# Patient Record
Sex: Female | Born: 1935 | Race: White | Hispanic: No | Smoking: Never smoker
Health system: Southern US, Community
[De-identification: ages and names within clinical notes are randomized; demographics above are authoritative.]

## PROBLEM LIST (undated history)

## (undated) DIAGNOSIS — F0390 Unspecified dementia without behavioral disturbance: Secondary | ICD-10-CM

## (undated) DIAGNOSIS — I10 Essential (primary) hypertension: Secondary | ICD-10-CM

## (undated) DIAGNOSIS — N184 Chronic kidney disease, stage 4 (severe): Secondary | ICD-10-CM

## (undated) DIAGNOSIS — F32A Depression, unspecified: Secondary | ICD-10-CM

## (undated) DIAGNOSIS — C189 Malignant neoplasm of colon, unspecified: Secondary | ICD-10-CM

## (undated) DIAGNOSIS — K435 Parastomal hernia without obstruction or  gangrene: Secondary | ICD-10-CM

## (undated) HISTORY — PX: COLECTOMY WITH COLOSTOMY CREATION/HARTMANN PROCEDURE: SHX6598

---

## 2008-09-12 ENCOUNTER — Ambulatory Visit: Payer: Self-pay | Admitting: Cardiovascular Disease

## 2008-09-12 ENCOUNTER — Other Ambulatory Visit: Payer: Self-pay

## 2008-09-12 ENCOUNTER — Ambulatory Visit: Payer: Self-pay | Admitting: Ophthalmology

## 2008-09-24 ENCOUNTER — Ambulatory Visit: Payer: Self-pay | Admitting: Ophthalmology

## 2008-12-03 ENCOUNTER — Ambulatory Visit: Payer: Self-pay | Admitting: Ophthalmology

## 2010-02-12 ENCOUNTER — Inpatient Hospital Stay: Payer: Self-pay | Admitting: Internal Medicine

## 2010-03-03 ENCOUNTER — Ambulatory Visit: Payer: Self-pay | Admitting: Internal Medicine

## 2010-03-04 ENCOUNTER — Inpatient Hospital Stay: Payer: Self-pay | Admitting: Internal Medicine

## 2010-03-10 ENCOUNTER — Inpatient Hospital Stay: Payer: Self-pay | Admitting: Internal Medicine

## 2010-03-13 ENCOUNTER — Encounter: Payer: Self-pay | Admitting: Internal Medicine

## 2012-09-19 ENCOUNTER — Emergency Department: Payer: Self-pay | Admitting: Internal Medicine

## 2012-09-19 LAB — COMPREHENSIVE METABOLIC PANEL
Albumin: 4 g/dL (ref 3.4–5.0)
Anion Gap: 13 (ref 7–16)
Bilirubin,Total: 0.5 mg/dL (ref 0.2–1.0)
Calcium, Total: 9.4 mg/dL (ref 8.5–10.1)
Chloride: 102 mmol/L (ref 98–107)
Co2: 20 mmol/L — ABNORMAL LOW (ref 21–32)
EGFR (African American): 37 — ABNORMAL LOW
Glucose: 134 mg/dL — ABNORMAL HIGH (ref 65–99)
Osmolality: 280 (ref 275–301)
SGOT(AST): 26 U/L (ref 15–37)
SGPT (ALT): 28 U/L (ref 12–78)
Total Protein: 8.2 g/dL (ref 6.4–8.2)

## 2012-09-19 LAB — CBC
MCH: 29.8 pg (ref 26.0–34.0)
MCHC: 33.8 g/dL (ref 32.0–36.0)
MCV: 88 fL (ref 80–100)
Platelet: 240 10*3/uL (ref 150–440)
RBC: 4.25 10*6/uL (ref 3.80–5.20)

## 2012-09-19 LAB — TROPONIN I: Troponin-I: 0.02 ng/mL

## 2014-07-20 DIAGNOSIS — D649 Anemia, unspecified: Secondary | ICD-10-CM | POA: Insufficient documentation

## 2014-07-20 DIAGNOSIS — D638 Anemia in other chronic diseases classified elsewhere: Secondary | ICD-10-CM | POA: Insufficient documentation

## 2015-12-25 DIAGNOSIS — N184 Chronic kidney disease, stage 4 (severe): Secondary | ICD-10-CM | POA: Diagnosis present

## 2017-04-05 DIAGNOSIS — F5104 Psychophysiologic insomnia: Secondary | ICD-10-CM | POA: Insufficient documentation

## 2021-07-27 ENCOUNTER — Emergency Department: Payer: Medicare Other

## 2021-07-27 ENCOUNTER — Inpatient Hospital Stay: Payer: Medicare Other

## 2021-07-27 ENCOUNTER — Other Ambulatory Visit: Payer: Self-pay

## 2021-07-27 ENCOUNTER — Inpatient Hospital Stay
Admission: EM | Admit: 2021-07-27 | Discharge: 2021-08-01 | DRG: 640 | Disposition: A | Discharge status: Skilled Nursing Facility | Payer: Medicare Other | Attending: Internal Medicine | Admitting: Internal Medicine

## 2021-07-27 DIAGNOSIS — Z933 Colostomy status: Secondary | ICD-10-CM

## 2021-07-27 DIAGNOSIS — E785 Hyperlipidemia, unspecified: Secondary | ICD-10-CM | POA: Diagnosis present

## 2021-07-27 DIAGNOSIS — Z681 Body mass index (BMI) 19 or less, adult: Secondary | ICD-10-CM

## 2021-07-27 DIAGNOSIS — Z20822 Contact with and (suspected) exposure to covid-19: Secondary | ICD-10-CM | POA: Diagnosis present

## 2021-07-27 DIAGNOSIS — A419 Sepsis, unspecified organism: Secondary | ICD-10-CM

## 2021-07-27 DIAGNOSIS — E861 Hypovolemia: Secondary | ICD-10-CM | POA: Diagnosis present

## 2021-07-27 DIAGNOSIS — R778 Other specified abnormalities of plasma proteins: Secondary | ICD-10-CM | POA: Diagnosis not present

## 2021-07-27 DIAGNOSIS — E871 Hypo-osmolality and hyponatremia: Principal | ICD-10-CM | POA: Diagnosis present

## 2021-07-27 DIAGNOSIS — I2510 Atherosclerotic heart disease of native coronary artery without angina pectoris: Secondary | ICD-10-CM | POA: Diagnosis present

## 2021-07-27 DIAGNOSIS — Z23 Encounter for immunization: Secondary | ICD-10-CM | POA: Diagnosis not present

## 2021-07-27 DIAGNOSIS — G9341 Metabolic encephalopathy: Secondary | ICD-10-CM

## 2021-07-27 DIAGNOSIS — Z66 Do not resuscitate: Secondary | ICD-10-CM | POA: Diagnosis not present

## 2021-07-27 DIAGNOSIS — R319 Hematuria, unspecified: Secondary | ICD-10-CM | POA: Diagnosis not present

## 2021-07-27 DIAGNOSIS — I10 Essential (primary) hypertension: Secondary | ICD-10-CM | POA: Insufficient documentation

## 2021-07-27 DIAGNOSIS — R809 Proteinuria, unspecified: Secondary | ICD-10-CM | POA: Diagnosis present

## 2021-07-27 DIAGNOSIS — R54 Age-related physical debility: Secondary | ICD-10-CM | POA: Diagnosis present

## 2021-07-27 DIAGNOSIS — I161 Hypertensive emergency: Secondary | ICD-10-CM | POA: Diagnosis present

## 2021-07-27 DIAGNOSIS — Z9049 Acquired absence of other specified parts of digestive tract: Secondary | ICD-10-CM

## 2021-07-27 DIAGNOSIS — J449 Chronic obstructive pulmonary disease, unspecified: Secondary | ICD-10-CM | POA: Diagnosis present

## 2021-07-27 DIAGNOSIS — I472 Ventricular tachycardia: Secondary | ICD-10-CM | POA: Diagnosis not present

## 2021-07-27 DIAGNOSIS — D631 Anemia in chronic kidney disease: Secondary | ICD-10-CM | POA: Diagnosis present

## 2021-07-27 DIAGNOSIS — Z85038 Personal history of other malignant neoplasm of large intestine: Secondary | ICD-10-CM

## 2021-07-27 DIAGNOSIS — N184 Chronic kidney disease, stage 4 (severe): Secondary | ICD-10-CM | POA: Diagnosis present

## 2021-07-27 DIAGNOSIS — I471 Supraventricular tachycardia: Secondary | ICD-10-CM | POA: Diagnosis not present

## 2021-07-27 DIAGNOSIS — E860 Dehydration: Secondary | ICD-10-CM | POA: Diagnosis present

## 2021-07-27 DIAGNOSIS — N390 Urinary tract infection, site not specified: Secondary | ICD-10-CM | POA: Diagnosis present

## 2021-07-27 DIAGNOSIS — E46 Unspecified protein-calorie malnutrition: Secondary | ICD-10-CM

## 2021-07-27 DIAGNOSIS — B9620 Unspecified Escherichia coli [E. coli] as the cause of diseases classified elsewhere: Secondary | ICD-10-CM | POA: Diagnosis present

## 2021-07-27 DIAGNOSIS — F0390 Unspecified dementia without behavioral disturbance: Secondary | ICD-10-CM | POA: Diagnosis present

## 2021-07-27 DIAGNOSIS — I160 Hypertensive urgency: Secondary | ICD-10-CM

## 2021-07-27 DIAGNOSIS — I129 Hypertensive chronic kidney disease with stage 1 through stage 4 chronic kidney disease, or unspecified chronic kidney disease: Secondary | ICD-10-CM | POA: Diagnosis present

## 2021-07-27 DIAGNOSIS — N179 Acute kidney failure, unspecified: Secondary | ICD-10-CM | POA: Diagnosis present

## 2021-07-27 DIAGNOSIS — I248 Other forms of acute ischemic heart disease: Secondary | ICD-10-CM | POA: Diagnosis present

## 2021-07-27 DIAGNOSIS — E43 Unspecified severe protein-calorie malnutrition: Secondary | ICD-10-CM | POA: Insufficient documentation

## 2021-07-27 DIAGNOSIS — I214 Non-ST elevation (NSTEMI) myocardial infarction: Secondary | ICD-10-CM | POA: Diagnosis not present

## 2021-07-27 DIAGNOSIS — D510 Vitamin B12 deficiency anemia due to intrinsic factor deficiency: Secondary | ICD-10-CM | POA: Diagnosis present

## 2021-07-27 DIAGNOSIS — E876 Hypokalemia: Secondary | ICD-10-CM | POA: Diagnosis present

## 2021-07-27 DIAGNOSIS — R64 Cachexia: Secondary | ICD-10-CM | POA: Diagnosis present

## 2021-07-27 DIAGNOSIS — E875 Hyperkalemia: Secondary | ICD-10-CM | POA: Diagnosis not present

## 2021-07-27 DIAGNOSIS — Z79899 Other long term (current) drug therapy: Secondary | ICD-10-CM

## 2021-07-27 DIAGNOSIS — R531 Weakness: Secondary | ICD-10-CM

## 2021-07-27 DIAGNOSIS — R7989 Other specified abnormal findings of blood chemistry: Secondary | ICD-10-CM

## 2021-07-27 DIAGNOSIS — I4729 Other ventricular tachycardia: Secondary | ICD-10-CM

## 2021-07-27 HISTORY — DX: Essential (primary) hypertension: I10

## 2021-07-27 HISTORY — DX: Chronic kidney disease, stage 4 (severe): N18.4

## 2021-07-27 HISTORY — DX: Malignant neoplasm of colon, unspecified: C18.9

## 2021-07-27 LAB — URINALYSIS, COMPLETE (UACMP) WITH MICROSCOPIC
Bacteria, UA: NONE SEEN
Bilirubin Urine: NEGATIVE
Glucose, UA: NEGATIVE mg/dL
Ketones, ur: 5 mg/dL — AB
Leukocytes,Ua: NEGATIVE
Nitrite: NEGATIVE
Protein, ur: 100 mg/dL — AB
Specific Gravity, Urine: 1.009 (ref 1.005–1.030)
Squamous Epithelial / HPF: NONE SEEN (ref 0–5)
pH: 6 (ref 5.0–8.0)

## 2021-07-27 LAB — CBC WITH DIFFERENTIAL/PLATELET
Abs Immature Granulocytes: 0.04 10*3/uL (ref 0.00–0.07)
Basophils Absolute: 0 10*3/uL (ref 0.0–0.1)
Basophils Relative: 0 %
Eosinophils Absolute: 0 10*3/uL (ref 0.0–0.5)
Eosinophils Relative: 0 %
HCT: 37.5 % (ref 36.0–46.0)
Hemoglobin: 13.8 g/dL (ref 12.0–15.0)
Immature Granulocytes: 1 %
Lymphocytes Relative: 9 %
Lymphs Abs: 0.7 10*3/uL (ref 0.7–4.0)
MCH: 30.6 pg (ref 26.0–34.0)
MCHC: 36.8 g/dL — ABNORMAL HIGH (ref 30.0–36.0)
MCV: 83.1 fL (ref 80.0–100.0)
Monocytes Absolute: 0.4 10*3/uL (ref 0.1–1.0)
Monocytes Relative: 5 %
Neutro Abs: 6.9 10*3/uL (ref 1.7–7.7)
Neutrophils Relative %: 85 %
Platelets: 323 10*3/uL (ref 150–400)
RBC: 4.51 MIL/uL (ref 3.87–5.11)
RDW: 12.5 % (ref 11.5–15.5)
WBC: 8 10*3/uL (ref 4.0–10.5)
nRBC: 0 % (ref 0.0–0.2)

## 2021-07-27 LAB — COMPREHENSIVE METABOLIC PANEL
ALT: 23 U/L (ref 0–44)
AST: 43 U/L — ABNORMAL HIGH (ref 15–41)
Albumin: 4.6 g/dL (ref 3.5–5.0)
Alkaline Phosphatase: 53 U/L (ref 38–126)
Anion gap: 13 (ref 5–15)
BUN: 26 mg/dL — ABNORMAL HIGH (ref 8–23)
CO2: 21 mmol/L — ABNORMAL LOW (ref 22–32)
Calcium: 9.2 mg/dL (ref 8.9–10.3)
Chloride: 79 mmol/L — ABNORMAL LOW (ref 98–111)
Creatinine, Ser: 1.25 mg/dL — ABNORMAL HIGH (ref 0.44–1.00)
GFR, Estimated: 39 mL/min — ABNORMAL LOW (ref >60)
Glucose, Bld: 119 mg/dL — ABNORMAL HIGH (ref 70–99)
Potassium: 4.4 mmol/L (ref 3.5–5.1)
Sodium: 113 mmol/L — CL (ref 135–145)
Total Bilirubin: 1.5 mg/dL — ABNORMAL HIGH (ref 0.3–1.2)
Total Protein: 7.9 g/dL (ref 6.5–8.1)

## 2021-07-27 LAB — PROTIME-INR
INR: 0.9 (ref 0.8–1.2)
Prothrombin Time: 12.5 seconds (ref 11.4–15.2)

## 2021-07-27 LAB — TROPONIN I (HIGH SENSITIVITY): Troponin I (High Sensitivity): 128 ng/L (ref <18)

## 2021-07-27 LAB — LIPASE, BLOOD: Lipase: 41 U/L (ref 11–51)

## 2021-07-27 LAB — LACTIC ACID, PLASMA: Lactic Acid, Venous: 1.8 mmol/L (ref 0.5–1.9)

## 2021-07-27 LAB — BRAIN NATRIURETIC PEPTIDE: B Natriuretic Peptide: 260 pg/mL — ABNORMAL HIGH (ref 0.0–100.0)

## 2021-07-27 MED ORDER — AMLODIPINE BESYLATE 5 MG PO TABS
5.0000 mg | ORAL_TABLET | Freq: Once | ORAL | Status: AC
  Administered 2021-07-28: 5 mg via ORAL
  Filled 2021-07-27: qty 1

## 2021-07-27 MED ORDER — IPRATROPIUM-ALBUTEROL 0.5-2.5 (3) MG/3ML IN SOLN
3.0000 mL | Freq: Once | RESPIRATORY_TRACT | Status: AC
  Administered 2021-07-27: 3 mL via RESPIRATORY_TRACT
  Filled 2021-07-27: qty 3

## 2021-07-27 MED ORDER — SODIUM CHLORIDE 0.9 % IV BOLUS
1000.0000 mL | Freq: Once | INTRAVENOUS | Status: AC
  Administered 2021-07-28: 1000 mL via INTRAVENOUS

## 2021-07-27 NOTE — ED Notes (Signed)
Patient repetitively fixating on her insurance company. Pt difficult to reorient.

## 2021-07-27 NOTE — ED Notes (Signed)
Patient reports that she has been weaker than usual for the past several days. Pt states that she has not eaten or drank anything for the past 2 days and has not taken her medications for the past 2 days. Pt very anxious on assessment.

## 2021-07-27 NOTE — ED Triage Notes (Signed)
Patient presents via [facility] from home complaining of weakness, SHOB, and nausea. Pt family mentions possible AMS.

## 2021-07-27 NOTE — ED Provider Notes (Signed)
[facility] Emergency Department Provider Note   ____________________________________________   Event Date/Time   First MD Initiated Contact with Patient 07/27/21 2120     (approximate)  I have reviewed the triage vital signs and the nursing notes.   HISTORY  Chief Complaint Weakness and Shortness of Breath    HPI Autumn Johnston is a 87 y.o. female who presents via EMS for generalized weakness and altered mental status.  Patient is a very poor historian and only states that "I been feeling just so weak".  Patient is not responding appropriately to any further questioning.  Per EMS, family states that patient has had nothing to eat or drink in the last 2 days nor has she had any of her normal medications including her blood pressure medications.  Patient also has a ostomy in place without any ostomy bag over top of it and EMS noted it to be significantly irritated.          Past Medical History:  Diagnosis Date   Hypertension     Patient Active Problem List   Diagnosis Date Noted   Benign hypertension 07/27/2021   Hyperlipidemia 07/27/2021   Pernicious anemia 07/27/2021   Renal failure (ARF), acute on chronic (HCC) 07/27/2021   Chronic insomnia 04/05/2017   Chronic kidney disease (CKD), stage IV (severe) ([place]) 12/25/2015   Anemia 07/20/2014     Prior to Admission medications   Medication Sig Start Date End Date Taking? Authorizing Provider  metoprolol tartrate (LOPRESSOR) 25 MG tablet Take 1 tablet by mouth 3 (three) times daily. 10/10/20  Yes [provider]  ALPRAZolam (XANAX) 0.25 MG tablet Take 0.25 mg by mouth 2 (two) times daily as needed. 05/06/21   [provider]  amLODipine (NORVASC) 5 MG tablet Take 5 mg by mouth daily. 05/24/21   [provider]  losartan (COZAAR) 100 MG tablet Take 100 mg by mouth daily. 05/24/21   [provider]  sertraline (ZOLOFT) 50 MG tablet Take 50 mg by mouth daily.  05/24/21   [provider]  simvastatin (ZOCOR) 20 MG tablet Take 20 mg by mouth at bedtime. 05/24/21   [provider]  zolpidem (AMBIEN) 10 MG tablet Take 10 mg by mouth at bedtime as needed. 05/05/21   [provider]    Allergies Patient has no known allergies.  No family history on file.  Social History Social History   Tobacco Use   Smoking status: Never   Smokeless tobacco: Never  Substance Use Topics   Alcohol use: Not Currently   Drug use: Not Currently    Review of Systems Unable to fully assess secondary to mental status ____________________________________________   PHYSICAL EXAM:  VITAL SIGNS: ED Triage Vitals  Enc Vitals Group     BP 07/27/21 2122 (!) 199/99     Pulse Rate 07/27/21 2122 (!) 106     Resp 07/27/21 2122 (!) 30     Temp 07/27/21 2122 97.7 F (36.5 C)     Temp Source 07/27/21 2122 Oral     SpO2 07/27/21 2122 99 %     Weight --      Height --      Head Circumference --      Peak Flow --      Pain Score 07/27/21 2128 5     Pain Loc --      Pain Edu? --      Excl. in [place]? --    Constitutional: Alert and  disoriented.  Cachectic elderly Caucasian female in no acute distress. Eyes: Conjunctivae are injected. PERRL. Head: Atraumatic. Nose: No congestion/rhinnorhea. Mouth/Throat: Mucous membranes are moist. Neck: No stridor Cardiovascular: Grossly normal heart sounds.  Good peripheral circulation. Respiratory: Increased respiratory effort.  No retractions. Gastrointestinal: Soft and nontender. No distention.  Left lower quadrant ostomy with a small amount of surrounding irritation. Musculoskeletal: No obvious deformities Neurologic:  Normal speech and language. No gross focal neurologic deficits are appreciated. Skin:  Skin is warm and dry. No rash noted. Psychiatric: Mood and affect are normal. Speech and behavior are normal.  ____________________________________________   LABS (all labs ordered are listed, but  only abnormal results are displayed)  Labs Reviewed  COMPREHENSIVE METABOLIC PANEL - Abnormal; Notable for the following components:      Result Value   Sodium 113 (*)    Chloride 79 (*)    CO2 21 (*)    Glucose, Bld 119 (*)    BUN 26 (*)    Creatinine, Ser 1.25 (*)    AST 43 (*)    Total Bilirubin 1.5 (*)    GFR, Estimated 39 (*)    All other components within normal limits  CBC WITH DIFFERENTIAL/PLATELET - Abnormal; Notable for the following components:   MCHC 36.8 (*)    All other components within normal limits  URINALYSIS, COMPLETE (UACMP) WITH MICROSCOPIC - Abnormal; Notable for the following components:   Color, Urine STRAW (*)    APPearance CLEAR (*)    Hgb urine dipstick MODERATE (*)    Ketones, ur 5 (*)    Protein, ur 100 (*)    All other components within normal limits  BRAIN NATRIURETIC PEPTIDE - Abnormal; Notable for the following components:   B Natriuretic Peptide 260.0 (*)    All other components within normal limits  TROPONIN I (HIGH SENSITIVITY) - Abnormal; Notable for the following components:   Troponin I (High Sensitivity) 128 (*)    All other components within normal limits  CULTURE, BLOOD (ROUTINE X 2)  CULTURE, BLOOD (ROUTINE X 2)  RESP PANEL BY RT-PCR (FLU A&B, COVID) ARPGX2  LACTIC ACID, PLASMA  PROTIME-INR  LIPASE, BLOOD  LACTIC ACID, PLASMA  TROPONIN I (HIGH SENSITIVITY)   ____________________________________________  EKG  ED ECG REPORT I, Naaman Plummer, the attending physician, personally viewed and interpreted this ECG.  Date: 07/27/2021 EKG Time: 2139 Rate: 100 Rhythm: Tachycardic sinus rhythm QRS Axis: normal Intervals: normal ST/T Wave abnormalities: normal Narrative Interpretation: Tachycardic sinus rhythm no evidence of acute ischemia  ____________________________________________  RADIOLOGY  ED MD interpretation: One-view portable chest x-ray shows no evidence of acute abnormalities including no pneumonia, pneumothorax,  or widened mediastinum  CT of the head without contrast shows no evidence of acute abnormalities including no intracerebral hemorrhage, obvious masses, or significant edema  Official radiology report(s): DG Chest 1 View  Result Date: 07/27/2021 CLINICAL DATA:  Weakness, shortness of breath, nausea EXAM: CHEST  1 VIEW COMPARISON:  03/03/2010 FINDINGS: There is hyperinflation of the lungs compatible with COPD. Biapical scarring. Heart is upper limits normal in size. No confluent opacities or effusions. No acute bony abnormality. IMPRESSION: COPD/chronic changes.  No active disease. Electronically Signed   By: Rolm Baptise M.D.   On: 07/27/2021 21:53   CT Head Wo Contrast  Result Date: 07/27/2021 CLINICAL DATA:  Weakness shortness of breath altered EXAM: CT HEAD WITHOUT CONTRAST TECHNIQUE: Contiguous axial images were obtained from the base of the skull through the vertex without intravenous contrast. COMPARISON:  CT brain 09/19/2012 FINDINGS: Brain: No acute territorial infarction, hemorrhage or intracranial mass. Moderate atrophy. Nonenlarged ventricles Vascular: No hyperdense vessels.  Carotid vascular calcification Skull: Normal. Negative for fracture or focal lesion. Sinuses/Orbits: No acute finding. Other: None IMPRESSION: 1. No CT evidence for acute intracranial abnormality. 2. Atrophy Electronically Signed   By: Donavan Foil M.D.   On: 07/27/2021 22:07    ____________________________________________   PROCEDURES  Procedure(s) performed (including Critical Care):  .1-3 Lead EKG Interpretation  Date/Time: 07/27/2021 11:19 PM Performed by: Naaman Plummer, MD Authorized by: Naaman Plummer, MD     Interpretation: normal     ECG rate:  92   ECG rate assessment: normal     Rhythm: sinus rhythm     Ectopy: none     Conduction: normal     ____________________________________________   INITIAL IMPRESSION / ASSESSMENT AND PLAN / ED COURSE  As part of my medical decision making, I  reviewed the following data within the electronic medical record, if available:  Nursing notes reviewed and incorporated, Labs reviewed, EKG interpreted, Old chart reviewed, Radiograph reviewed and Notes from prior ED visits reviewed and incorporated     Patient is a 87 year old female that presents via EMS for generalized weakness. Patient found to be hyponatremic with an AKI and NSTEMI without an overt and safely reversible cause and therefore requires admission for further monitoring and management.  As patient not significantly clinically volume down, altered or with emergent sequelae defer fluid resuscitation at this time and increasing Sodium to admitting team and inpatient consultants.      ____________________________________________   FINAL CLINICAL IMPRESSION(S) / ED DIAGNOSES  Final diagnoses:  Generalized weakness  Dehydration  Hyponatremia  AKI (acute kidney injury) [facility])     ED Discharge Orders     None        Note:  This document was prepared using Dragon voice recognition software and may include unintentional dictation errors.    Naaman Plummer, MD 07/27/21 [phone]

## 2021-07-28 ENCOUNTER — Encounter: Payer: Self-pay | Admitting: Internal Medicine

## 2021-07-28 DIAGNOSIS — I248 Other forms of acute ischemic heart disease: Secondary | ICD-10-CM | POA: Diagnosis not present

## 2021-07-28 DIAGNOSIS — E46 Unspecified protein-calorie malnutrition: Secondary | ICD-10-CM

## 2021-07-28 DIAGNOSIS — E871 Hypo-osmolality and hyponatremia: Principal | ICD-10-CM

## 2021-07-28 DIAGNOSIS — R778 Other specified abnormalities of plasma proteins: Secondary | ICD-10-CM

## 2021-07-28 DIAGNOSIS — I160 Hypertensive urgency: Secondary | ICD-10-CM

## 2021-07-28 LAB — LIPID PANEL
Cholesterol: 270 mg/dL — ABNORMAL HIGH (ref 0–200)
HDL: 122 mg/dL (ref >40)
LDL Cholesterol: 141 mg/dL — ABNORMAL HIGH (ref 0–99)
Total CHOL/HDL Ratio: 2.2 RATIO
Triglycerides: 37 mg/dL (ref <150)
VLDL: 7 mg/dL (ref 0–40)

## 2021-07-28 LAB — BASIC METABOLIC PANEL
Anion gap: 12 (ref 5–15)
BUN: 24 mg/dL — ABNORMAL HIGH (ref 8–23)
CO2: 21 mmol/L — ABNORMAL LOW (ref 22–32)
Calcium: 8.5 mg/dL — ABNORMAL LOW (ref 8.9–10.3)
Chloride: 83 mmol/L — ABNORMAL LOW (ref 98–111)
Creatinine, Ser: 1.01 mg/dL — ABNORMAL HIGH (ref 0.44–1.00)
GFR, Estimated: 51 mL/min — ABNORMAL LOW (ref >60)
Glucose, Bld: 115 mg/dL — ABNORMAL HIGH (ref 70–99)
Potassium: 3.9 mmol/L (ref 3.5–5.1)
Sodium: 116 mmol/L — CL (ref 135–145)

## 2021-07-28 LAB — APTT: aPTT: 27 seconds (ref 24–36)

## 2021-07-28 LAB — SODIUM
Sodium: 113 mmol/L — CL (ref 135–145)
Sodium: 128 mmol/L — ABNORMAL LOW (ref 135–145)
Sodium: 116 mmol/L — CL (ref 135–145)
Sodium: 116 mmol/L — CL (ref 135–145)
Sodium: 118 mmol/L — CL (ref 135–145)
Sodium: 122 mmol/L — ABNORMAL LOW (ref 135–145)

## 2021-07-28 LAB — TROPONIN I (HIGH SENSITIVITY)
Troponin I (High Sensitivity): 187 ng/L (ref <18)
Troponin I (High Sensitivity): 476 ng/L (ref <18)
Troponin I (High Sensitivity): 406 ng/L (ref <18)

## 2021-07-28 LAB — HEMOGLOBIN A1C
Hgb A1c MFr Bld: 5.6 % (ref 4.8–5.6)
Mean Plasma Glucose: 114.02 mg/dL

## 2021-07-28 LAB — HEPARIN LEVEL (UNFRACTIONATED)
Heparin Unfractionated: 0.42 IU/mL (ref 0.30–0.70)
Heparin Unfractionated: 0.38 IU/mL (ref 0.30–0.70)

## 2021-07-28 LAB — SODIUM, URINE, RANDOM: Sodium, Ur: 92 mmol/L

## 2021-07-28 LAB — RESP PANEL BY RT-PCR (FLU A&B, COVID) ARPGX2
Influenza A by PCR: NEGATIVE
Influenza B by PCR: NEGATIVE
SARS Coronavirus 2 by RT PCR: NEGATIVE

## 2021-07-28 LAB — OSMOLALITY: Osmolality: 245 mOsm/kg — CL (ref 275–295)

## 2021-07-28 LAB — OSMOLALITY, URINE: Osmolality, Ur: 371 mOsm/kg (ref 300–900)

## 2021-07-28 LAB — LACTIC ACID, PLASMA: Lactic Acid, Venous: 2.5 mmol/L (ref 0.5–1.9)

## 2021-07-28 MED ORDER — HEPARIN (PORCINE) 25000 UT/250ML-% IV SOLN
550.0000 [IU]/h | INTRAVENOUS | Status: DC
  Administered 2021-07-28: 550 [IU]/h via INTRAVENOUS
  Filled 2021-07-28 (×2): qty 250

## 2021-07-28 MED ORDER — DESMOPRESSIN ACETATE 4 MCG/ML IJ SOLN
2.0000 ug | Freq: Four times a day (QID) | INTRAMUSCULAR | Status: DC
  Administered 2021-07-28: 2 ug via INTRAVENOUS
  Filled 2021-07-28 (×2): qty 1

## 2021-07-28 MED ORDER — ONDANSETRON HCL 4 MG PO TABS: 4.0000 mg | ORAL_TABLET | Freq: Four times a day (QID) | ORAL | Status: DC | PRN

## 2021-07-28 MED ORDER — ACETAMINOPHEN 650 MG RE SUPP: 650.0000 mg | Freq: Four times a day (QID) | RECTAL | Status: DC | PRN

## 2021-07-28 MED ORDER — SODIUM CHLORIDE 3 % IV SOLN
INTRAVENOUS | Status: DC
  Filled 2021-07-28 (×2): qty 500

## 2021-07-28 MED ORDER — ASPIRIN EC 81 MG PO TBEC
81.0000 mg | DELAYED_RELEASE_TABLET | Freq: Every day | ORAL | Status: DC
  Administered 2021-07-28 – 2021-08-01 (×5): 81 mg via ORAL
  Filled 2021-07-28 (×5): qty 1

## 2021-07-28 MED ORDER — IOHEXOL 350 MG/ML SOLN
75.0000 mL | Freq: Once | INTRAVENOUS | Status: AC | PRN
  Administered 2021-07-28: 75 mL via INTRAVENOUS

## 2021-07-28 MED ORDER — SODIUM CHLORIDE 0.9 % IV SOLN: INTRAVENOUS | Status: DC

## 2021-07-28 MED ORDER — ACETAMINOPHEN 325 MG PO TABS: 650.0000 mg | ORAL_TABLET | Freq: Four times a day (QID) | ORAL | Status: DC | PRN

## 2021-07-28 MED ORDER — AMLODIPINE BESYLATE 5 MG PO TABS
5.0000 mg | ORAL_TABLET | Freq: Every day | ORAL | Status: DC
  Administered 2021-07-28 – 2021-07-30 (×3): 5 mg via ORAL
  Filled 2021-07-28 (×3): qty 1

## 2021-07-28 MED ORDER — DEXTROSE 5 % IV SOLN
500.0000 mL | Freq: Once | INTRAVENOUS | Status: AC
  Administered 2021-07-28: 500 mL via INTRAVENOUS
  Filled 2021-07-28: qty 500

## 2021-07-28 MED ORDER — ENOXAPARIN SODIUM 30 MG/0.3ML IJ SOSY: 30.0000 mg | PREFILLED_SYRINGE | INTRAMUSCULAR | Status: DC

## 2021-07-28 MED ORDER — ONDANSETRON HCL 4 MG/2ML IJ SOLN
4.0000 mg | Freq: Four times a day (QID) | INTRAMUSCULAR | Status: DC | PRN
  Administered 2021-07-28: 4 mg via INTRAVENOUS
  Filled 2021-07-28: qty 2

## 2021-07-28 MED ORDER — HALOPERIDOL LACTATE 5 MG/ML IJ SOLN: 0.5000 mg | Freq: Four times a day (QID) | INTRAMUSCULAR | Status: DC | PRN

## 2021-07-28 MED ORDER — CARVEDILOL 6.25 MG PO TABS
6.2500 mg | ORAL_TABLET | Freq: Two times a day (BID) | ORAL | Status: DC
  Administered 2021-07-28 – 2021-07-30 (×4): 6.25 mg via ORAL
  Filled 2021-07-28 (×4): qty 1

## 2021-07-28 NOTE — H&P (Addendum)
History and Physical    Autumn Johnston [id number] DOB: Feb 01, 1922 DOA: 07/27/2021  PCP: Idelle Crouch, MD   Patient coming from: home  I have personally briefly reviewed patient's old medical records in [place]  Chief Complaint: altered mental status  HPI: Autumn Johnston is a 87 y.o. female with medical history significant for HTN, CKD4, colon cancer s/p colectomy with colostomy who lives at home with her husband and is independent and takes care of all her financial affairs herself who was brought into the ED with a concern for weakness and altered mental status.  Most of the history is taken from her daughter, [name] at the bedside.  She states that patient was in her usual state of health until about 2 days ago when she noticed that she was too weak to climb the 3 stairs in the home of the split-level house.  Additionally she has been eating less than usual but has been trying to stay hydrated.  She has not recently been ill and denies any recent vomiting, increasing colostomy output, cough or shortness of breath or fever or chills.  She did note that her mother appeared to be acting differently, complaining about the medication that her doctor for several years had been giving her and overall acting out of the ordinary.  She states that she has been drinking a lot of fluids over the past couple months because of her poor kidney function associated with a high potassium of 6.4 back in May but otherwise she has had no medical complaints.  ED course: On arrival temperature 97.7, pulse 106, BP 199/99, respirations 30 with O2 sat 99% on room air CBC unremarkable, CMP with sodium 113, down from 127 on 7/27 at her doctor's office, creatinine 1.25 down from baseline of around 1.9, also with GFR of 39 down from baseline below 30.  Lipase 41.  Troponin 128 and BNP 260.  Lactic acid 1.8.  Urinalysis with moderate hemoglobin and proteinuria and 5 ketones  EKG, personally  viewed and interpreted: Sinus tachycardia at 100 with no acute ST-T wave changes  Imaging: Chest x-ray with COPD/chronic changes Head CT with no acute intracranial findings  Patient given an IV fluid bolus of normal saline.  Hospitalist consulted for admission.  Review of Systems: Unable to obtain due to confusion  Past Medical History:  Diagnosis Date   CKD (chronic kidney disease) stage 4, GFR 15-29 ml/min (HCC)    Colon cancer ([place])    Hypertension     Past Surgical History:  Procedure Laterality Date   COLECTOMY WITH COLOSTOMY CREATION/HARTMANN PROCEDURE       reports that she has never smoked. She has never used smokeless tobacco. She reports previous alcohol use. She reports previous drug use.  No Known Allergies  History reviewed. No pertinent family history.    Prior to Admission medications   Medication Sig Start Date End Date Taking? Authorizing Provider  metoprolol tartrate (LOPRESSOR) 25 MG tablet Take 1 tablet by mouth 3 (three) times daily. 10/10/20  Yes [provider]  ALPRAZolam (XANAX) 0.25 MG tablet Take 0.25 mg by mouth 2 (two) times daily as needed. 05/06/21   [provider]  amLODipine (NORVASC) 5 MG tablet Take 5 mg by mouth daily. 05/24/21   [provider]  losartan (COZAAR) 100 MG tablet Take 100 mg by mouth daily. 05/24/21   [provider]  sertraline (ZOLOFT) 50 MG tablet Take 50 mg by mouth daily. 05/24/21  [provider]  simvastatin (ZOCOR) 20 MG tablet Take 20 mg by mouth at bedtime. 05/24/21   [provider]  zolpidem (AMBIEN) 10 MG tablet Take 10 mg by mouth at bedtime as needed. 05/05/21   [provider]    Physical Exam: Vitals:   07/27/21 2122 07/27/21 2300 07/27/21 2315  BP: (!) 199/99 (!) 185/81   Pulse: (!) 106 93   Resp: (!) 30 (!) 23   Temp: 97.7 F (36.5 C)    TempSrc: Oral    SpO2: 99% 99%   Weight:   45.4 kg  Height:   5\' 3"  (1.6 m)     Vitals:   07/27/21 2122  07/27/21 2300 07/27/21 2315  BP: (!) 199/99 (!) 185/81   Pulse: (!) 106 93   Resp: (!) 30 (!) 23   Temp: 97.7 F (36.5 C)    TempSrc: Oral    SpO2: 99% 99%   Weight:   45.4 kg  Height:   5\' 3"  (1.6 m)      Constitutional: Cachectic appearance but alert and confused.  Talks a lot but unrelated to questions asked not in any apparent distress HEENT:      Head: Normocephalic and atraumatic.         Eyes: PERLA, EOMI, Conjunctivae are normal. Sclera is non-icteric.       Mouth/Throat: Mucous membranes are moist.       Neck: Supple with no signs of meningismus. Cardiovascular: Regular rate and rhythm. No murmurs, gallops, or rubs. 2+ symmetrical distal pulses are present . No JVD. No LE edema Respiratory: Respiratory effort normal .Lungs sounds clear bilaterally. No wheezes, crackles, or rhonchi.  Gastrointestinal: Soft, non tender, and non distended with positive bowel sounds.  Genitourinary: No CVA tenderness. Musculoskeletal: Nontender with normal range of motion in all extremities. No cyanosis, or erythema of extremities. Neurologic:  Face is symmetric. Moving all extremities. No gross focal neurologic deficits . Skin: Skin is warm, dry.  No rash or ulcers Psychiatric: Appears a bit anxious.  Talking a lot   Labs on Admission: I have personally reviewed following labs and imaging studies  CBC: Recent Labs  Lab 07/27/21 2136  WBC 8.0  NEUTROABS 6.9  HGB 13.8  HCT 37.5  MCV 83.1  PLT 119   Basic Metabolic Panel: Recent Labs  Lab 07/27/21 2136  NA 113*  K 4.4  CL 79*  CO2 21*  GLUCOSE 119*  BUN 26*  CREATININE 1.25*  CALCIUM 9.2   GFR: Estimated Creatinine Clearance: 18.9 mL/min (A) (by C-G formula based on SCr of 1.25 mg/dL (H)). Liver Function Tests: Recent Labs  Lab 07/27/21 2136  AST 43*  ALT 23  ALKPHOS 53  BILITOT 1.5*  PROT 7.9  ALBUMIN 4.6   Recent Labs  Lab 07/27/21 2136  LIPASE 41   No results for input(s): AMMONIA in the last 168  hours. Coagulation Profile: Recent Labs  Lab 07/27/21 2136  INR 0.9   Cardiac Enzymes: No results for input(s): CKTOTAL, CKMB, CKMBINDEX, TROPONINI in the last 168 hours. BNP (last 3 results) No results for input(s): PROBNP in the last 8760 hours. HbA1C: No results for input(s): HGBA1C in the last 72 hours. CBG: No results for input(s): GLUCAP in the last 168 hours. Lipid Profile: No results for input(s): CHOL, HDL, LDLCALC, TRIG, CHOLHDL, LDLDIRECT in the last 72 hours. Thyroid Function Tests: No results for input(s): TSH, T4TOTAL, FREET4, T3FREE, THYROIDAB in the last 72 hours. Anemia Panel: No results  for input(s): VITAMINB12, FOLATE, FERRITIN, TIBC, IRON, RETICCTPCT in the last 72 hours. Urine analysis:    Component Value Date/Time   COLORURINE STRAW (A) 07/27/2021 2136   APPEARANCEUR CLEAR (A) 07/27/2021 2136   LABSPEC 1.009 07/27/2021 2136   PHURINE 6.0 07/27/2021 2136   GLUCOSEU NEGATIVE 07/27/2021 2136   HGBUR MODERATE (A) 07/27/2021 2136   BILIRUBINUR NEGATIVE 07/27/2021 2136   KETONESUR 5 (A) 07/27/2021 2136   PROTEINUR 100 (A) 07/27/2021 2136   NITRITE NEGATIVE 07/27/2021 2136   LEUKOCYTESUR NEGATIVE 07/27/2021 2136    Radiological Exams on Admission: DG Chest 1 View  Result Date: 07/27/2021 CLINICAL DATA:  Weakness, shortness of breath, nausea EXAM: CHEST  1 VIEW COMPARISON:  03/03/2010 FINDINGS: There is hyperinflation of the lungs compatible with COPD. Biapical scarring. Heart is upper limits normal in size. No confluent opacities or effusions. No acute bony abnormality. IMPRESSION: COPD/chronic changes.  No active disease. Electronically Signed   By: Rolm Baptise M.D.   On: 07/27/2021 21:53   CT Head Wo Contrast  Result Date: 07/27/2021 CLINICAL DATA:  Weakness shortness of breath altered EXAM: CT HEAD WITHOUT CONTRAST TECHNIQUE: Contiguous axial images were obtained from the base of the skull through the vertex without intravenous contrast. COMPARISON:  CT  brain 09/19/2012 FINDINGS: Brain: No acute territorial infarction, hemorrhage or intracranial mass. Moderate atrophy. Nonenlarged ventricles Vascular: No hyperdense vessels.  Carotid vascular calcification Skull: Normal. Negative for fracture or focal lesion. Sinuses/Orbits: No acute finding. Other: None IMPRESSION: 1. No CT evidence for acute intracranial abnormality. 2. Atrophy Electronically Signed   By: Donavan Foil M.D.   On: 07/27/2021 22:07     Assessment/Plan 87 year old female with history of HTN, CKD4, colon cancer s/p colectomy with colostomy presenting with weakness and altered mental status.      Acute hyponatremia, symptomatic -Patient symptomatic for altered mental status - Sodium 113, down from 127 on 7/27 - Suspecting hypotonic hypovolemic given decreased oral intake x2 days and ketonuria on UA, vs hypotonic related to increase water intake to improve renal function as advised by PCP during last visit on 7/27 - Urine and sodium osmolality and urine sodium to assist with etiology Addendum: - Repeat sodium following delayed IV fluid bolus from ED unchanged 113-113 - 3% saline infusion with monitoring per order set protocol Addendum#2: - Repeat sodium 128 suggesting overcorrection and DDAVP and D5 infusion were ordered however redraw for verification was done due to rapid overcorrection.  Repeat sodium resulting at 116 and 3% was reinitiated but at a rate of 30 mils per hour    Acute metabolic encephalopathy - Etiology related to sodium level.  No alternate etiology seen at this time - CT head with chronic ischemic changes and no acute abnormality.  Neuro exam nonfocal - We will keep n.p.o. for now - Fall and aspiration precautions - Increase nursing care    NSTEMI/ Elevated troponin - Troponin 287>681>157.   - Patient denies chest pain and EKG nonacute - NSTEMI versus demand ischemia -Discussed risk benefit of anticoagulation with daughter and she wants full scope of  treatment - Heparin infusion - Echocardiogram to evaluate for segmental wall motion abnormality.  Given renal function will likely not be a candidate for cardiac cath - Cardiology consult  Hypertensive urgency -BP 199/99 likely from not taking medication past couple days due to altered mental status - IV hydralazine as needed  History of colon cancer s/p colectomy with colostomy - Routine colostomy care    Chronic kidney disease (CKD),  stage IV -patient has had CKD stage IV since October 2020.  Does not follow with a nephrologist - GFR of 39, improved from prior GFR below 30 for past couple years - Consider renal consult  Protein calorie malnutrition, unspecified - Patient with cachectic appearance and daughter stating progressive weight loss over the past 1 to 2 years - Dietary consult  DVT prophylaxis: Lovenox  Code Status: full code  Family Communication: Spoke with daughter, Theola Sequin and explained plan of care.  She verbalized understanding and voiced agreement.  We discussed CODE STATUS and she confirmed that patient is a full code to the best of her knowledge of her mother's wishes Disposition Plan: Back to previous home environment Consults called: cardiology  Status:At the time of admission, it appears that the appropriate admission status for this patient is INPATIENT. This is judged to be reasonable and necessary in order to provide the required intensity of service to ensure the patient's safety given the presenting symptoms, physical exam findings, and initial radiographic and laboratory data in the context of their  Comorbid conditions.   Patient requires inpatient status due to high intensity of service, high risk for further deterioration and high frequency of surveillance required.   I certify that at the point of admission it is my clinical judgment that the patient will require inpatient hospital care spanning beyond [employer] MD [facility]     07/28/2021, 12:09 AM

## 2021-07-28 NOTE — Consult Note (Addendum)
Cardiology Consultation:   Patient ID: Zavia Pullen MRN: [id number]; DOB: [birth year]  Admit date: 07/27/2021 Date of Consult: 07/28/2021  PCP:  Idelle Crouch, MD   [facility] Cardiologist:  New   Patient Profile:   Mikki Ziff is a 87 y.o. female with a hx of HTN, HLD, CKD stage 4, colon cancer with colostomy  who is being seen 07/28/2021 for the evaluation of elevated troponin at the request of Dr. Roosevelt Locks.  History of Present Illness:   Ms. Lyday has no prior significant cardiac history. She is accompanied by her daughter, who provided history given patient's AMS. Denies family history of cardiac disease. No smoking, alcohol, or drug history. Patient lives with husband and is very functional at baseline. No baseline dementia. She was taking losartan, amlodipine for BP and simvastatin for HLD. Says she commonly has elevated blood pressures at the doctor's office.   The patient presented to the ER 07/28/21 for AMS and weakness. She was brought from home. Daughter reported symptoms started 2-3 days ago. Patient was unable to climb the stairs. Also reported low appetite. No known chest pain, palpitations, SOB, LLE, Orthopnea, pnd.   In the ER BP 199/99, RR 30, temp 97.7, pulse 106bpm. Labs showed sodium 113, Scr 1.25, lipase 41, BNP 260, LA 1.8. HS trop 166>063>016. UA with HGB and protein. CXR showed COPD, no active disease. EKG not found in the chart. CT head w/o contrast negative for acute abnormality. Chest CT showed no PE. Pt was given IVF and started on IV heparin. Nephrology consulted for hyponatremia.   Past Medical History:  Diagnosis Date   CKD (chronic kidney disease) stage 4, GFR 15-29 ml/min (HCC)    Colon cancer (HCC)    Hypertension     Past Surgical History:  Procedure Laterality Date   COLECTOMY WITH COLOSTOMY CREATION/HARTMANN PROCEDURE       Home Medications:  Prior to Admission medications   Medication Sig Start Date End Date Taking?  Authorizing Provider  ALPRAZolam (XANAX) 0.25 MG tablet Take 0.25 mg by mouth 2 (two) times daily as needed. 05/06/21   [provider]  amLODipine (NORVASC) 5 MG tablet Take 5 mg by mouth daily. 05/24/21   [provider]  losartan (COZAAR) 100 MG tablet Take 100 mg by mouth daily. 05/24/21   [provider]  sertraline (ZOLOFT) 50 MG tablet Take 50 mg by mouth daily. 05/24/21   [provider]  simvastatin (ZOCOR) 20 MG tablet Take 20 mg by mouth at bedtime. 05/24/21   [provider]  zolpidem (AMBIEN) 10 MG tablet Take 10 mg by mouth at bedtime as needed. 05/05/21   [provider]    Inpatient Medications: Scheduled Meds:  amLODipine  5 mg Oral Daily   Continuous Infusions:  heparin 550 Units/hr (07/28/21 0430)   sodium chloride (hypertonic) 30 mL/hr at 07/28/21 0624   PRN Meds: acetaminophen **OR** acetaminophen, haloperidol lactate, ondansetron **OR** ondansetron (ZOFRAN) IV  Allergies:   No Known Allergies  Social History:   Social History   Socioeconomic History   Marital status: Married    Spouse name: Not on file   Number of children: Not on file   Years of education: Not on file   Highest education level: Not on file  Occupational History   Not on file  Tobacco Use   Smoking status: Never   Smokeless tobacco: Never  Substance and Sexual Activity   Alcohol use: Never   Drug use: Never  Sexual activity: Not on file  Other Topics Concern   Not on file  Social History Narrative   Not on file   Social Determinants of Health   Financial Resource Strain: Not on file  Food Insecurity: Not on file  Transportation Needs: Not on file  Physical Activity: Not on file  Stress: Not on file  Social Connections: Not on file  Intimate Partner Violence: Not on file    Family History:   History reviewed. No pertinent family history.   ROS:  Please see the history of present illness.   All other ROS reviewed and  negative.     Physical Exam/Data:   Vitals:   07/28/21 0700 07/28/21 0730 07/28/21 0800 07/28/21 0900  BP: (!) 146/74 (!) 146/74 (!) 157/71 (!) 160/82  Pulse: 88 84 86 91  Resp: (!) 24 (!) 22 (!) 21 16  Temp:      TempSrc:      SpO2: 99% 100% 100% 100%  Weight:      Height:        Intake/Output Summary (Last 24 hours) at 07/28/2021 0956 Last data filed at 07/28/2021 0735 Gross per 24 hour  Intake 1156.11 ml  Output 580 ml  Net 576.11 ml   Last 3 Weights 07/27/2021  Weight (lbs) 100 lb  Weight (kg) 45.36 kg     Body mass index is 17.71 kg/m.  General:  Well nourished, well developed, in no acute distress HEENT: normal Lymph: no adenopathy Neck: no JVD Endocrine:  No thryomegaly Vascular: No carotid bruits; FA pulses 2+ bilaterally without bruits  Cardiac:  normal S1, S2; RRR; no murmur  Lungs:  clear to auscultation bilaterally, no wheezing, rhonchi or rales  Abd: soft, nontender, no hepatomegaly  Ext: no edema Musculoskeletal:  No deformities, BUE and BLE strength normal and equal Skin: warm and dry  Neuro:  CNs 2-12 intact, no focal abnormalities noted Psych:  Normal affect   EKG:  The EKG was personally reviewed and demonstrates:  pending Telemetry:  Telemetry was personally reviewed and demonstrates:  NSR, PAC, PVC, HR 80-90s  Relevant CV Studies:  Echo ordered  Laboratory Data:  High Sensitivity Troponin:   Recent Labs  Lab 07/27/21 2136 07/27/21 2234 07/28/21 0110 07/28/21 0428  TROPONINIHS 128* 187* 476* 406*     Chemistry Recent Labs  Lab 07/27/21 2136 07/28/21 0110 07/28/21 0428 07/28/21 0528 07/28/21 0653  NA 113*   < > 128* 116* 116*  K 4.4  --   --   --  3.9  CL 79*  --   --   --  83*  CO2 21*  --   --   --  21*  GLUCOSE 119*  --   --   --  115*  BUN 26*  --   --   --  24*  CREATININE 1.25*  --   --   --  1.01*  CALCIUM 9.2  --   --   --  8.5*  GFRNONAA 39*  --   --   --  51*  ANIONGAP 13  --   --   --  12   < > = values in this  interval not displayed.    Recent Labs  Lab 07/27/21 2136  PROT 7.9  ALBUMIN 4.6  AST 43*  ALT 23  ALKPHOS 53  BILITOT 1.5*   Hematology Recent Labs  Lab 07/27/21 2136  WBC 8.0  RBC 4.51  HGB 13.8  HCT 37.5  MCV 83.1  MCH 30.6  MCHC 36.8*  RDW 12.5  PLT 323   BNP Recent Labs  Lab 07/27/21 2136  BNP 260.0*    DDimer No results for input(s): DDIMER in the last 168 hours.   Radiology/Studies:  DG Chest 1 View  Result Date: 07/27/2021 CLINICAL DATA:  Weakness, shortness of breath, nausea EXAM: CHEST  1 VIEW COMPARISON:  03/03/2010 FINDINGS: There is hyperinflation of the lungs compatible with COPD. Biapical scarring. Heart is upper limits normal in size. No confluent opacities or effusions. No acute bony abnormality. IMPRESSION: COPD/chronic changes.  No active disease. Electronically Signed   By: Rolm Baptise M.D.   On: 07/27/2021 21:53   CT Head Wo Contrast  Result Date: 07/27/2021 CLINICAL DATA:  Weakness shortness of breath altered EXAM: CT HEAD WITHOUT CONTRAST TECHNIQUE: Contiguous axial images were obtained from the base of the skull through the vertex without intravenous contrast. COMPARISON:  CT brain 09/19/2012 FINDINGS: Brain: No acute territorial infarction, hemorrhage or intracranial mass. Moderate atrophy. Nonenlarged ventricles Vascular: No hyperdense vessels.  Carotid vascular calcification Skull: Normal. Negative for fracture or focal lesion. Sinuses/Orbits: No acute finding. Other: None IMPRESSION: 1. No CT evidence for acute intracranial abnormality. 2. Atrophy Electronically Signed   By: Donavan Foil M.D.   On: 07/27/2021 22:07   CT Angio Chest PE W/Cm &/Or Wo Cm  Result Date: 07/28/2021 CLINICAL DATA:  Weakness and altered mental status EXAM: CT ANGIOGRAPHY CHEST WITH CONTRAST TECHNIQUE: Multidetector CT imaging of the chest was performed using the standard protocol during bolus administration of intravenous contrast. Multiplanar CT image reconstructions  and MIPs were obtained to evaluate the vascular anatomy. CONTRAST:  49 mL OMNIPAQUE 350 COMPARISON:  Chest x-ray from the previous day. FINDINGS: Cardiovascular: Atherosclerotic calcifications are noted without aneurysmal dilatation or dissection. No cardiac enlargement is noted. Coronary calcifications are seen. The pulmonary artery shows a normal branching pattern. Pulmonary artery shows a normal branching pattern without evidence of intraluminal filling defect to suggest pulmonary embolism. Mediastinum/Nodes: Thoracic inlet is within normal limits. No sizable hilar or mediastinal adenopathy is noted. The esophagus as visualized is within normal limits. Lungs/Pleura: Lungs are well aerated bilaterally. Tiny less than 5 mm nodule is noted within the left upper lobe best seen on image number 53 of series 6. Scattered nodules are noted within the right lower lobe measuring approximately 5 mm. Upper Abdomen: Cholelithiasis is seen. Hepatic and renal cysts are noted. No acute abnormality in the upper abdomen is seen. Musculoskeletal: Degenerative changes of the thoracic spine are noted. Increased kyphosis is seen. No acute compression deformity is noted. No rib abnormality is seen. Review of the MIP images confirms the above findings. IMPRESSION: No evidence of pulmonary embolism. Scattered small nodules in the lungs bilaterally. No follow-up needed if patient is low-risk (and has no known or suspected primary neoplasm). Non-contrast chest CT can be considered in 12 months if patient is high-risk. This recommendation follows the consensus statement: Guidelines for Management of Incidental Pulmonary Nodules Detected on CT Images: From the Fleischner Society 2017; Radiology 2017; 284:228-243. Cholelithiasis without complicating factors. Aortic Atherosclerosis (ICD10-I70.0). Electronically Signed   By: Inez Catalina M.D.   On: 07/28/2021 00:40     Assessment and Plan:   Hyponatremia - Sodium 113 on arrival. Level was  normal 7/27 labs - s/p IVF and saline solution - repeat sodium 116 - further work-up per IM/nephrology  Acute encephalopathy - Unclear reason for AMS. No baseline dementia reported by daughter - She  is A&O x1.  - CT head without acute abnormality - she is NPO per IM - Blood culture collected  Elevated troponin - in the setting of AMS, electrolyte abnormality, and severe HTN - Hs trop peak 476, now down trending - No EKG in the chart and unable to locate EKG down. Will order EKG - No chest pain reported - RF include HTN and HLD - CT chest showed coronary atherosclerosis - IV heparin started - echo ordered - Pending echo can consider ischemic evaluation, but can likely be done as outpatient - check A1C and lipid panel - start ASA 81mg  daily and coreg 6.25mg  BID. Continue statin - Further work-up pending echo. Continue IV heparin for 48 hours.   Hypertensive urgency - BP 199/99 on arrival, suspected not on medications due to AMS - PTA losartan 100mg  daily amlodipine 5mg . ARB held for CKD/Na - IV hydralazine  - start coreg as above.  - Bps improved but still elevated - BNP mildly elevated, may be due to mild CHF 2/2 hypertension. Does not appear volume up on exam. Echo pending as above. Of note was given IVF  HLD - PTA simvastatin - check lipid panl  CKD stage IV - Cr 1.25 on admission. Improved s/p IVF.  - nephrology following - daily BMET  H/o colon cancer s/p colectomy with colostomy - per IM   For questions or updates, please contact [facility] Please consult [email or web address] for contact info under    Signed, Lateasha Breuer Ninfa Meeker, PA-C  07/28/2021 9:56 AM

## 2021-07-28 NOTE — Progress Notes (Signed)
[facility] PROGRESS NOTE    Autumn Johnston  GQQ:[id number] DOB: [birth year] DOA: 07/27/2021 PCP: Idelle Crouch, MD   Chief complaint.  Altered mental status. Brief Narrative:  Autumn Johnston is a 87 y.o. female with medical history significant for HTN, CKD4, colon cancer s/p colectomy with colostomy who lives at home with her husband and is independent and takes care of all her financial affairs herself who was brought into the ED with a concern for weakness and altered mental status.  Patient had hypocalcemia with a potassium of 6.4 in May, since that time, he was advised to drink excessive amount of fluids.  She has been drinking large amount of water on a daily basis.  Upon arriving the emergency room, she was found to have altered mental status with potassium 113.  He was given 3% sodium chloride.  Consult from nephrology is also obtained.   Assessment & Plan:   Principal Problem:   Acute hyponatremia Active Problems:   Chronic kidney disease (CKD), stage IV (severe) (HCC)   Elevated troponin   Acute metabolic encephalopathy   Hypertensive urgency   Protein calorie malnutrition ([place])  #1.  Severe hyponatremia. Altered mental status with acute metabolic cephalopathy secondary to hypokalemia. This appears to be secondary to polydipsia.  Patient has been started on 3% sodium chloride.  Appreciate nephrology consult.  We will continue to follow closely.  Patient mental status has improved since admission to the hospital. I will start a diet without restriction with sodium.  Fluid restriction 1200 mL.  2.  Non-ST elevation myocardial infarction. Patient has been followed by cardiology, currently on heparin drip.  3.  Hypertensive emergency Blood pressure is better since admission to the hospital.  4.  Chronic kidney disease stage IV. Renal function stable now.  5.  Severe protein calorie malnutrition. Patient appear cachectic, will start supplement once sodium level is  better.  Try to avoid large amount oral fluids.    DVT prophylaxis: Lovenox Code Status: full, discussed with patient daughter, advised DNR.  She will talk to patient and her family and make a decision Family Communication: Daughter updated Disposition Plan:    Status is: Inpatient  Remains inpatient appropriate because:Altered mental status, IV treatments appropriate due to intensity of illness or inability to take PO, and Inpatient level of care appropriate due to severity of illness  Dispo: The patient is from: Home              Anticipated d/c is to: Home              Patient currently is not medically stable to d/c.   Difficult to place patient No        I/O last 3 completed shifts: In: 1156.1 [I.V.:156.1; IV Piggyback:1000] Out: 100 [Urine:100] Total I/O In: -  Out: 52 [Urine:625]     Consultants:  Nephrology  Procedures: None  Antimicrobials: None  Subjective: Patient mental status has been better, she no longer has any confusion. She denies any short of breath or cough. No abdominal pain nausea vomiting.   Objective: Vitals:   07/28/21 1030 07/28/21 1130 07/28/21 1200 07/28/21 1230  BP: (!) 157/74 (!) 152/78 137/72 138/72  Pulse: 87 84 83 83  Resp: 17 (!) 21 (!) 31 (!) 27  Temp:      TempSrc:      SpO2: 100% 100% 100% 100%  Weight:      Height:        Intake/Output Summary (Last 24  hours) at 07/28/2021 1340 Last data filed at 07/28/2021 1049 Gross per 24 hour  Intake 1156.11 ml  Output 725 ml  Net 431.11 ml   Filed Weights   07/27/21 2315  Weight: 45.4 kg    Examination:  General exam: Appears calm and comfortable, cachectic Respiratory system: Clear to auscultation. Respiratory effort normal. Cardiovascular system: S1 & S2 heard, RRR. No JVD, murmurs, rubs, gallops or clicks. No pedal edema. Gastrointestinal system: Abdomen is nondistended, soft and nontender. No organomegaly or masses felt. Normal bowel sounds heard. Central nervous  system: Alert and oriented x2. No focal neurological deficits. Extremities: Symmetric 5 x 5 power. Skin: No rashes, lesions or ulcers Psychiatry: Judgement and insight appear normal. Mood & affect appropriate.     Data Reviewed: I have personally reviewed following labs and imaging studies  CBC: Recent Labs  Lab 07/27/21 2136  WBC 8.0  NEUTROABS 6.9  HGB 13.8  HCT 37.5  MCV 83.1  PLT 417   Basic Metabolic Panel: Recent Labs  Lab 07/27/21 2136 07/28/21 0110 07/28/21 0428 07/28/21 0528 07/28/21 0653 07/28/21 0955  NA 113* 113* 128* 116* 116* 116*  K 4.4  --   --   --  3.9  --   CL 79*  --   --   --  83*  --   CO2 21*  --   --   --  21*  --   GLUCOSE 119*  --   --   --  115*  --   BUN 26*  --   --   --  24*  --   CREATININE 1.25*  --   --   --  1.01*  --   CALCIUM 9.2  --   --   --  8.5*  --    GFR: Estimated Creatinine Clearance: 23.3 mL/min (A) (by C-G formula based on SCr of 1.01 mg/dL (H)). Liver Function Tests: Recent Labs  Lab 07/27/21 2136  AST 43*  ALT 23  ALKPHOS 53  BILITOT 1.5*  PROT 7.9  ALBUMIN 4.6   Recent Labs  Lab 07/27/21 2136  LIPASE 41   No results for input(s): AMMONIA in the last 168 hours. Coagulation Profile: Recent Labs  Lab 07/27/21 2136  INR 0.9   Cardiac Enzymes: No results for input(s): CKTOTAL, CKMB, CKMBINDEX, TROPONINI in the last 168 hours. BNP (last 3 results) No results for input(s): PROBNP in the last 8760 hours. HbA1C: No results for input(s): HGBA1C in the last 72 hours. CBG: No results for input(s): GLUCAP in the last 168 hours. Lipid Profile: Recent Labs    07/28/21 1215  CHOL 270*  HDL 122  LDLCALC 141*  TRIG 37  CHOLHDL 2.2   Thyroid Function Tests: No results for input(s): TSH, T4TOTAL, FREET4, T3FREE, THYROIDAB in the last 72 hours. Anemia Panel: No results for input(s): VITAMINB12, FOLATE, FERRITIN, TIBC, IRON, RETICCTPCT in the last 72 hours. Sepsis Labs: Recent Labs  Lab 07/27/21 2136  07/28/21 0110  LATICACIDVEN 1.8 2.5*    Recent Results (from the past 240 hour(s))  Culture, blood (Routine x 2)     Status: None (Preliminary result)   Collection Time: 07/27/21  9:36 PM   Specimen: BLOOD  Result Value Ref Range Status   Specimen Description BLOOD BLOOD LEFT FOREARM  Final   Special Requests   Final    BOTTLES DRAWN AEROBIC AND ANAEROBIC Blood Culture results may not be optimal due to an inadequate volume of blood received in culture  bottles   Culture   Final    NO GROWTH < 12 HOURS Performed at [facility], [address] [place], [place] [address]    Report Status PENDING  Incomplete  Resp Panel by RT-PCR (Flu A&B, Covid) Nasopharyngeal Swab     Status: None   Collection Time: 07/27/21  9:36 PM   Specimen: Nasopharyngeal Swab; Nasopharyngeal(NP) swabs in vial transport medium  Result Value Ref Range Status   SARS Coronavirus 2 by RT PCR NEGATIVE NEGATIVE Final    Comment: (NOTE) SARS-CoV-2 target nucleic acids are NOT DETECTED.  The SARS-CoV-2 RNA is generally detectable in upper respiratory specimens during the acute phase of infection. The lowest concentration of SARS-CoV-2 viral copies this assay can detect is 138 copies/mL. A negative result does not preclude SARS-Cov-2 infection and should not be used as the sole basis for treatment or other patient management decisions. A negative result may occur with  improper specimen collection/handling, submission of specimen other than nasopharyngeal swab, presence of viral mutation(s) within the areas targeted by this assay, and inadequate number of viral copies(<138 copies/mL). A negative result must be combined with clinical observations, patient history, and epidemiological information. The expected result is Negative.  Fact Sheet for Patients:  EntrepreneurPulse.com.au  Fact Sheet for Healthcare Providers:  IncredibleEmployment.be  This test is no t yet  approved or cleared by the [place] FDA and  has been authorized for detection and/or diagnosis of SARS-CoV-2 by FDA under an Emergency Use Authorization (EUA). This EUA will remain  in effect (meaning this test can be used) for the duration of the COVID-19 declaration under Section 564(b)(1) of the Act, 21 U.S.C.section 360bbb-3(b)(1), unless the authorization is terminated  or revoked sooner.       Influenza A by PCR NEGATIVE NEGATIVE Final   Influenza B by PCR NEGATIVE NEGATIVE Final    Comment: (NOTE) The Xpert Xpress SARS-CoV-2/FLU/RSV plus assay is intended as an aid in the diagnosis of influenza from Nasopharyngeal swab specimens and should not be used as a sole basis for treatment. Nasal washings and aspirates are unacceptable for Xpert Xpress SARS-CoV-2/FLU/RSV testing.  Fact Sheet for Patients: EntrepreneurPulse.com.au  Fact Sheet for Healthcare Providers: IncredibleEmployment.be  This test is not yet approved or cleared by the [place] FDA and has been authorized for detection and/or diagnosis of SARS-CoV-2 by FDA under an Emergency Use Authorization (EUA). This EUA will remain in effect (meaning this test can be used) for the duration of the COVID-19 declaration under Section 564(b)(1) of the Act, 21 U.S.C. section 360bbb-3(b)(1), unless the authorization is terminated or revoked.  Performed at [facility], [address] [place], [place] [address]   Culture, blood (Routine x 2)     Status: None (Preliminary result)   Collection Time: 07/28/21  1:10 AM   Specimen: BLOOD  Result Value Ref Range Status   Specimen Description BLOOD BLOOD RIGHT FOREARM  Final   Special Requests   Final    BOTTLES DRAWN AEROBIC AND ANAEROBIC Blood Culture adequate volume   Culture   Final    NO GROWTH < 12 HOURS Performed at [facility], [address] [place], [place] [address]    Report Status PENDING   Incomplete         Radiology Studies: DG Chest 1 View  Result Date: 07/27/2021 CLINICAL DATA:  Weakness, shortness of breath, nausea EXAM: CHEST  1 VIEW COMPARISON:  03/03/2010 FINDINGS: There is hyperinflation of the lungs compatible with COPD.  Biapical scarring. Heart is upper limits normal in size. No confluent opacities or effusions. No acute bony abnormality. IMPRESSION: COPD/chronic changes.  No active disease. Electronically Signed   By: Rolm Baptise M.D.   On: 07/27/2021 21:53   CT Head Wo Contrast  Result Date: 07/27/2021 CLINICAL DATA:  Weakness shortness of breath altered EXAM: CT HEAD WITHOUT CONTRAST TECHNIQUE: Contiguous axial images were obtained from the base of the skull through the vertex without intravenous contrast. COMPARISON:  CT brain 09/19/2012 FINDINGS: Brain: No acute territorial infarction, hemorrhage or intracranial mass. Moderate atrophy. Nonenlarged ventricles Vascular: No hyperdense vessels.  Carotid vascular calcification Skull: Normal. Negative for fracture or focal lesion. Sinuses/Orbits: No acute finding. Other: None IMPRESSION: 1. No CT evidence for acute intracranial abnormality. 2. Atrophy Electronically Signed   By: Donavan Foil M.D.   On: 07/27/2021 22:07   CT Angio Chest PE W/Cm &/Or Wo Cm  Result Date: 07/28/2021 CLINICAL DATA:  Weakness and altered mental status EXAM: CT ANGIOGRAPHY CHEST WITH CONTRAST TECHNIQUE: Multidetector CT imaging of the chest was performed using the standard protocol during bolus administration of intravenous contrast. Multiplanar CT image reconstructions and MIPs were obtained to evaluate the vascular anatomy. CONTRAST:  49 mL OMNIPAQUE 350 COMPARISON:  Chest x-ray from the previous day. FINDINGS: Cardiovascular: Atherosclerotic calcifications are noted without aneurysmal dilatation or dissection. No cardiac enlargement is noted. Coronary calcifications are seen. The pulmonary artery shows a normal branching pattern. Pulmonary  artery shows a normal branching pattern without evidence of intraluminal filling defect to suggest pulmonary embolism. Mediastinum/Nodes: Thoracic inlet is within normal limits. No sizable hilar or mediastinal adenopathy is noted. The esophagus as visualized is within normal limits. Lungs/Pleura: Lungs are well aerated bilaterally. Tiny less than 5 mm nodule is noted within the left upper lobe best seen on image number 53 of series 6. Scattered nodules are noted within the right lower lobe measuring approximately 5 mm. Upper Abdomen: Cholelithiasis is seen. Hepatic and renal cysts are noted. No acute abnormality in the upper abdomen is seen. Musculoskeletal: Degenerative changes of the thoracic spine are noted. Increased kyphosis is seen. No acute compression deformity is noted. No rib abnormality is seen. Review of the MIP images confirms the above findings. IMPRESSION: No evidence of pulmonary embolism. Scattered small nodules in the lungs bilaterally. No follow-up needed if patient is low-risk (and has no known or suspected primary neoplasm). Non-contrast chest CT can be considered in 12 months if patient is high-risk. This recommendation follows the consensus statement: Guidelines for Management of Incidental Pulmonary Nodules Detected on CT Images: From the Fleischner Society 2017; Radiology 2017; 284:228-243. Cholelithiasis without complicating factors. Aortic Atherosclerosis (ICD10-I70.0). Electronically Signed   By: Inez Catalina M.D.   On: 07/28/2021 00:40        Scheduled Meds:  amLODipine  5 mg Oral Daily   aspirin EC  81 mg Oral Daily   carvedilol  6.25 mg Oral BID WC   Continuous Infusions:  heparin 550 Units/hr (07/28/21 0430)   sodium chloride (hypertonic) 30 mL/hr at 07/28/21 0624     LOS: 1 day    Time spent: No charge    Sharen Hones, MD [facility]   To contact the attending provider between 7A-7P or the covering provider during after hours 7P-7A, please log into  the web site [email or web address] and access using universal [place] password for that web site. If you do not have the password, please call the hospital operator.  07/28/2021, 1:40 PM

## 2021-07-28 NOTE — Progress Notes (Signed)
ANTICOAGULATION CONSULT NOTE - Initial Consult  Pharmacy Consult for Heparin  Indication: chest pain/ACS  No Known Allergies  Patient Measurements: Height: 5\' 3"  (160 cm) Weight: 45.4 kg (100 lb) IBW/kg (Calculated) : 52.4 Heparin Dosing Weight: 45.4 kg   Vital Signs: Temp: 97.7 F (36.5 C) (08/07 2122) Temp Source: Oral (08/07 2122) BP: 162/82 (08/08 0104) Pulse Rate: 92 (08/08 0315)  Labs: Recent Labs    07/27/21 2136 07/27/21 2234 07/28/21 0110  HGB 13.8  --   --   HCT 37.5  --   --   PLT 323  --   --   LABPROT 12.5  --   --   INR 0.9  --   --   CREATININE 1.25*  --   --   TROPONINIHS 128* 187* 476*    Estimated Creatinine Clearance: 18.9 mL/min (A) (by C-G formula based on SCr of 1.25 mg/dL (H)).   Medical History: Past Medical History:  Diagnosis Date   CKD (chronic kidney disease) stage 4, GFR 15-29 ml/min (HCC)    Colon cancer (HCC)    Hypertension     Medications:  (Not in a hospital admission)   Assessment: Pharmacy consulted to dose heparin in this 87 year old female admitted with ACS/NSTEMI.  No prior anticoag noted.  CrCl = 18.9 ml/min   Goal of Therapy:  Heparin level 0.3-0.7 units/ml Monitor platelets by anticoagulation protocol: Yes   Plan:  No bolus per MD request.  Start heparin infusion at 550 units/hr Check anti-Xa level in 8 hours and daily while on heparin Continue to monitor H&H and platelets  Autumn Johnston D 07/28/2021,4:15 AM

## 2021-07-28 NOTE — Consult Note (Signed)
[facility] Consult Note: 07/28/21     Date of Admission:  07/27/2021           Reason for Consult:  hyponatremia   Referring Provider: Sharen Hones, MD Primary Care Provider: Idelle Crouch, MD   History of Presenting Illness:  Autumn Johnston is a 87 y.o. female  Patient brought to the hospital via EMS for weakness, shortness of breath and nausea.  Family also reported altered mental status.  Per report, patient was not able to eat or drink 2 days prior to admission.  She has an ostomy in place. Initial work-up in the emergency room showed severe hyponatremia with sodium of 113.  This morning sodium is 116.  Nephrology consult has been requested for evaluation. Outpatient labs from July 27 show mildly low sodium of 127, creatinine of 1.7, GFR 20 Normal TSH 2.138 in May 2022, normal urinalysis in May 2022 Sodium trends reviewed Sodium of 128 seems to be lab error as it is inconsistent with the trends Information is obtained from patient's daughters who are in the room with her.  Patient herself is confused and fixated on some insurance issue.  Family reports that patient had elevated potassium back in May/June.  She was advised to increase fluid intake but patient started drinking large amounts of plain water.  Her appetite was poor she was not eating as much.  Review of Systems: ROS Limited review of systems due to patient being confused Gen: Denies any fevers or chills, poor appetite HEENT: No vision or hearing problems CV: No chest pain or shortness of breath Resp: No cough or sputum production GI: No nausea, vomiting or diarrhea.  No blood in the stool GU : No problems with voiding.  No hematuria.   MS: Ambulatory at home.  Denies any acute joint pain or swelling Derm:   No complaints Psych: No complaints Heme: No complaints Neuro: No complaints Endocrine: No complaints    Past Medical History:  Diagnosis Date   CKD (chronic kidney disease)  stage 4, GFR 15-29 ml/min (HCC)    Colon cancer (HCC)    Hypertension     Social History   Tobacco Use   Smoking status: Never   Smokeless tobacco: Never  Substance Use Topics   Alcohol use: Never   Drug use: Never    History reviewed. No pertinent family history.   OBJECTIVE: Blood pressure (!) 157/71, pulse 86, temperature 97.7 F (36.5 C), temperature source Oral, resp. rate (!) 21, height 5\' 3"  (1.6 m), weight 45.4 kg, SpO2 100 %.  Physical Exam General appearance: Frail, elderly woman, laying in the bed HEENT: Moist oral mucous membranes, anicteric Neck: No JVD, no masses Pulmonary: Normal breathing effort, decreased breath sounds at bases, no crackles Cardiovascular: Irregular rhythm Abdomen: Soft, nontender Extremities: No peripheral edema Musculoskeletal/joints: No acute joint swelling or effusion Skin: Warm, dry  Lab Results Lab Results  Component Value Date   WBC 8.0 07/27/2021   HGB 13.8 07/27/2021   HCT 37.5 07/27/2021   MCV 83.1 07/27/2021   PLT 323 07/27/2021    Lab Results  Component Value Date   CREATININE 1.01 (H) 07/28/2021   BUN 24 (H) 07/28/2021   NA 116 (LL) 07/28/2021   K 3.9 07/28/2021   CL 83 (L) 07/28/2021   CO2 21 (L) 07/28/2021    Lab Results  Component Value Date   ALT 23 07/27/2021   AST 43 (H) 07/27/2021   ALKPHOS 53 07/27/2021  BILITOT 1.5 (H) 07/27/2021     Microbiology: Recent Results (from the past 240 hour(s))  Culture, blood (Routine x 2)     Status: None (Preliminary result)   Collection Time: 07/27/21  9:36 PM   Specimen: BLOOD  Result Value Ref Range Status   Specimen Description BLOOD BLOOD LEFT FOREARM  Final   Special Requests   Final    BOTTLES DRAWN AEROBIC AND ANAEROBIC Blood Culture results may not be optimal due to an inadequate volume of blood received in culture bottles   Culture   Final    NO GROWTH < 12 HOURS Performed at [facility], [address] [place], [place] [address]     Report Status PENDING  Incomplete  Resp Panel by RT-PCR (Flu A&B, Covid) Nasopharyngeal Swab     Status: None   Collection Time: 07/27/21  9:36 PM   Specimen: Nasopharyngeal Swab; Nasopharyngeal(NP) swabs in vial transport medium  Result Value Ref Range Status   SARS Coronavirus 2 by RT PCR NEGATIVE NEGATIVE Final    Comment: (NOTE) SARS-CoV-2 target nucleic acids are NOT DETECTED.  The SARS-CoV-2 RNA is generally detectable in upper respiratory specimens during the acute phase of infection. The lowest concentration of SARS-CoV-2 viral copies this assay can detect is 138 copies/mL. A negative result does not preclude SARS-Cov-2 infection and should not be used as the sole basis for treatment or other patient management decisions. A negative result may occur with  improper specimen collection/handling, submission of specimen other than nasopharyngeal swab, presence of viral mutation(s) within the areas targeted by this assay, and inadequate number of viral copies(<138 copies/mL). A negative result must be combined with clinical observations, patient history, and epidemiological information. The expected result is Negative.  Fact Sheet for Patients:  EntrepreneurPulse.com.au  Fact Sheet for Healthcare Providers:  IncredibleEmployment.be  This test is no t yet approved or cleared by the [place] FDA and  has been authorized for detection and/or diagnosis of SARS-CoV-2 by FDA under an Emergency Use Authorization (EUA). This EUA will remain  in effect (meaning this test can be used) for the duration of the COVID-19 declaration under Section 564(b)(1) of the Act, 21 U.S.C.section 360bbb-3(b)(1), unless the authorization is terminated  or revoked sooner.       Influenza A by PCR NEGATIVE NEGATIVE Final   Influenza B by PCR NEGATIVE NEGATIVE Final    Comment: (NOTE) The Xpert Xpress SARS-CoV-2/FLU/RSV plus assay is intended as an aid in the  diagnosis of influenza from Nasopharyngeal swab specimens and should not be used as a sole basis for treatment. Nasal washings and aspirates are unacceptable for Xpert Xpress SARS-CoV-2/FLU/RSV testing.  Fact Sheet for Patients: EntrepreneurPulse.com.au  Fact Sheet for Healthcare Providers: IncredibleEmployment.be  This test is not yet approved or cleared by the [place] FDA and has been authorized for detection and/or diagnosis of SARS-CoV-2 by FDA under an Emergency Use Authorization (EUA). This EUA will remain in effect (meaning this test can be used) for the duration of the COVID-19 declaration under Section 564(b)(1) of the Act, 21 U.S.C. section 360bbb-3(b)(1), unless the authorization is terminated or revoked.  Performed at [facility], [address] [place], [place] [address]   Culture, blood (Routine x 2)     Status: None (Preliminary result)   Collection Time: 07/28/21  1:10 AM   Specimen: BLOOD  Result Value Ref Range Status   Specimen Description BLOOD BLOOD RIGHT FOREARM  Final   Special Requests   Final  BOTTLES DRAWN AEROBIC AND ANAEROBIC Blood Culture adequate volume   Culture   Final    NO GROWTH < 12 HOURS Performed at [facility], [address] [place], [place] [address]    Report Status PENDING  Incomplete    Medications: Scheduled Meds:  amLODipine  5 mg Oral Daily   Continuous Infusions:  heparin 550 Units/hr (07/28/21 0430)   sodium chloride (hypertonic) 30 mL/hr at 07/28/21 0624   PRN Meds:.acetaminophen **OR** acetaminophen, haloperidol lactate, ondansetron **OR** ondansetron (ZOFRAN) IV  No Known Allergies  Urinalysis: Recent Labs    07/27/21 2136  COLORURINE STRAW*  LABSPEC 1.009  PHURINE 6.0  GLUCOSEU NEGATIVE  HGBUR MODERATE*  BILIRUBINUR NEGATIVE  KETONESUR 5*  PROTEINUR 100*  NITRITE NEGATIVE  LEUKOCYTESUR NEGATIVE      Imaging: DG Chest 1 View  Result  Date: 07/27/2021 CLINICAL DATA:  Weakness, shortness of breath, nausea EXAM: CHEST  1 VIEW COMPARISON:  03/03/2010 FINDINGS: There is hyperinflation of the lungs compatible with COPD. Biapical scarring. Heart is upper limits normal in size. No confluent opacities or effusions. No acute bony abnormality. IMPRESSION: COPD/chronic changes.  No active disease. Electronically Signed   By: Rolm Baptise M.D.   On: 07/27/2021 21:53   CT Head Wo Contrast  Result Date: 07/27/2021 CLINICAL DATA:  Weakness shortness of breath altered EXAM: CT HEAD WITHOUT CONTRAST TECHNIQUE: Contiguous axial images were obtained from the base of the skull through the vertex without intravenous contrast. COMPARISON:  CT brain 09/19/2012 FINDINGS: Brain: No acute territorial infarction, hemorrhage or intracranial mass. Moderate atrophy. Nonenlarged ventricles Vascular: No hyperdense vessels.  Carotid vascular calcification Skull: Normal. Negative for fracture or focal lesion. Sinuses/Orbits: No acute finding. Other: None IMPRESSION: 1. No CT evidence for acute intracranial abnormality. 2. Atrophy Electronically Signed   By: Donavan Foil M.D.   On: 07/27/2021 22:07   CT Angio Chest PE W/Cm &/Or Wo Cm  Result Date: 07/28/2021 CLINICAL DATA:  Weakness and altered mental status EXAM: CT ANGIOGRAPHY CHEST WITH CONTRAST TECHNIQUE: Multidetector CT imaging of the chest was performed using the standard protocol during bolus administration of intravenous contrast. Multiplanar CT image reconstructions and MIPs were obtained to evaluate the vascular anatomy. CONTRAST:  49 mL OMNIPAQUE 350 COMPARISON:  Chest x-ray from the previous day. FINDINGS: Cardiovascular: Atherosclerotic calcifications are noted without aneurysmal dilatation or dissection. No cardiac enlargement is noted. Coronary calcifications are seen. The pulmonary artery shows a normal branching pattern. Pulmonary artery shows a normal branching pattern without evidence of intraluminal  filling defect to suggest pulmonary embolism. Mediastinum/Nodes: Thoracic inlet is within normal limits. No sizable hilar or mediastinal adenopathy is noted. The esophagus as visualized is within normal limits. Lungs/Pleura: Lungs are well aerated bilaterally. Tiny less than 5 mm nodule is noted within the left upper lobe best seen on image number 53 of series 6. Scattered nodules are noted within the right lower lobe measuring approximately 5 mm. Upper Abdomen: Cholelithiasis is seen. Hepatic and renal cysts are noted. No acute abnormality in the upper abdomen is seen. Musculoskeletal: Degenerative changes of the thoracic spine are noted. Increased kyphosis is seen. No acute compression deformity is noted. No rib abnormality is seen. Review of the MIP images confirms the above findings. IMPRESSION: No evidence of pulmonary embolism. Scattered small nodules in the lungs bilaterally. No follow-up needed if patient is low-risk (and has no known or suspected primary neoplasm). Non-contrast chest CT can be considered in 12 months if patient is high-risk. This recommendation follows  the consensus statement: Guidelines for Management of Incidental Pulmonary Nodules Detected on CT Images: From the Fleischner Society 2017; Radiology 2017; 284:228-243. Cholelithiasis without complicating factors. Aortic Atherosclerosis (ICD10-I70.0). Electronically Signed   By: Inez Catalina M.D.   On: 07/28/2021 00:40      Assessment/Plan:  Autumn Johnston is a 87 y.o. female with medical problems of   hypertension, CKD, colon cancer status post colectomy with a colostomy   was admitted on 07/27/2021 for :  Hyponatremia [E87.1]  #Acute hyponatremia Likely secondary to "tea and toast diet".  Patient's appetite has been poor as outpatient but she has been drinking increased amounts of plain water.  This may have contributed to Developing hyponatremia over time.  Review of outpatient records show that her sodium level was normal in  May 2022.  Mildly decreased sodium of 132 noted in July, further decrease noted on July 27 with sodium level of 127.  TSH normal.  CT with IV contrast without any evidence of lung mass.  Agree with 3% (hypertonic) saline administration at low-dose Currently sodium will be monitored every 4 hours and dose of hypertonic saline adjusted accordingly. Patient does appear to be delirious with some altered mental status compared to baseline as per family Monitor neuro status carefully  #Acute kidney injury Outpatient creatinine has been higher Unclear cause but ? Related to losartan. Currently on hold.  Creatinine level close to normal during this hospitalization. Will follow   Autumn Johnston Candiss Norse 07/28/21

## 2021-07-28 NOTE — ED Notes (Signed)
Pt resting quietly in bed, awake. Family member at bedside.

## 2021-07-28 NOTE — Progress Notes (Signed)
MEDICATION RELATED CONSULT NOTE - INITIAL   Pharmacy Consult for Hypertonic Saline  Indication: Na Monitoring   No Known Allergies  Patient Measurements: Height: 5\' 3"  (160 cm) Weight: 45.4 kg (100 lb) IBW/kg (Calculated) : 52.4 Adjusted Body Weight:   Vital Signs: Temp: 97.7 F (36.5 C) (08/07 2122) Temp Source: Oral (08/07 2122) BP: 162/82 (08/08 0104) Pulse Rate: 92 (08/08 0315) Intake/Output from previous day: 08/07 0701 - 08/08 0700 In: 1000 [IV Piggyback:1000] Out: 100 [Urine:100] Intake/Output from this shift: Total I/O In: 1000 [IV Piggyback:1000] Out: 100 [Urine:100]  Labs: Recent Labs    07/27/21 2136  WBC 8.0  HGB 13.8  HCT 37.5  PLT 323  CREATININE 1.25*  ALBUMIN 4.6  PROT 7.9  AST 43*  ALT 23  ALKPHOS 53  BILITOT 1.5*   Estimated Creatinine Clearance: 18.9 mL/min (A) (by C-G formula based on SCr of 1.25 mg/dL (H)).   Microbiology: Recent Results (from the past 720 hour(s))  Resp Panel by RT-PCR (Flu A&B, Covid) Nasopharyngeal Swab     Status: None   Collection Time: 07/27/21  9:36 PM   Specimen: Nasopharyngeal Swab; Nasopharyngeal(NP) swabs in vial transport medium  Result Value Ref Range Status   SARS Coronavirus 2 by RT PCR NEGATIVE NEGATIVE Final    Comment: (NOTE) SARS-CoV-2 target nucleic acids are NOT DETECTED.  The SARS-CoV-2 RNA is generally detectable in upper respiratory specimens during the acute phase of infection. The lowest concentration of SARS-CoV-2 viral copies this assay can detect is 138 copies/mL. A negative result does not preclude SARS-Cov-2 infection and should not be used as the sole basis for treatment or other patient management decisions. A negative result may occur with  improper specimen collection/handling, submission of specimen other than nasopharyngeal swab, presence of viral mutation(s) within the areas targeted by this assay, and inadequate number of viral copies(<138 copies/mL). A negative result must  be combined with clinical observations, patient history, and epidemiological information. The expected result is Negative.  Fact Sheet for Patients:  EntrepreneurPulse.com.au  Fact Sheet for Healthcare Providers:  IncredibleEmployment.be  This test is no t yet approved or cleared by the [place] FDA and  has been authorized for detection and/or diagnosis of SARS-CoV-2 by FDA under an Emergency Use Authorization (EUA). This EUA will remain  in effect (meaning this test can be used) for the duration of the COVID-19 declaration under Section 564(b)(1) of the Act, 21 U.S.C.section 360bbb-3(b)(1), unless the authorization is terminated  or revoked sooner.       Influenza A by PCR NEGATIVE NEGATIVE Final   Influenza B by PCR NEGATIVE NEGATIVE Final    Comment: (NOTE) The Xpert Xpress SARS-CoV-2/FLU/RSV plus assay is intended as an aid in the diagnosis of influenza from Nasopharyngeal swab specimens and should not be used as a sole basis for treatment. Nasal washings and aspirates are unacceptable for Xpert Xpress SARS-CoV-2/FLU/RSV testing.  Fact Sheet for Patients: EntrepreneurPulse.com.au  Fact Sheet for Healthcare Providers: IncredibleEmployment.be  This test is not yet approved or cleared by the [place] FDA and has been authorized for detection and/or diagnosis of SARS-CoV-2 by FDA under an Emergency Use Authorization (EUA). This EUA will remain in effect (meaning this test can be used) for the duration of the COVID-19 declaration under Section 564(b)(1) of the Act, 21 U.S.C. section 360bbb-3(b)(1), unless the authorization is terminated or revoked.  Performed at [facility], [address], [place], [place] [address]     Medical History: Past Medical History:  Diagnosis Date   CKD (chronic kidney disease) stage 4, GFR 15-29 ml/min (HCC)    Colon cancer (HCC)    Hypertension      Medications:  (Not in a hospital admission)    Goal of Therapy:  Na WNL Max rate of Na correction 8 mEq/L in 24 hrs.   Plan:  8/8 0110 = 113  Will check Na Q2H X 3 and then Q4H there after.   Fia Hebert D 07/28/2021,4:17 AM

## 2021-07-28 NOTE — ED Notes (Signed)
200 ml left of NS bolus, then will hang infusion.

## 2021-07-28 NOTE — ED Notes (Signed)
Dr. Damita Dunnings notified of 128 sodium. md placing orders.

## 2021-07-28 NOTE — Progress Notes (Signed)
Anticoagulation monitoring(Lovenox):  87 yo female ordered Lovenox 40 mg Q24h    Filed Weights   07/27/21 2315  Weight: 45.4 kg (100 lb)   BMI  17.7   Lab Results  Component Value Date   CREATININE 1.25 (H) 07/27/2021   CREATININE 1.45 (H) 09/19/2012   Estimated Creatinine Clearance: 18.9 mL/min (A) (by C-G formula based on SCr of 1.25 mg/dL (H)). Hemoglobin & Hematocrit     Component Value Date/Time   HGB 13.8 07/27/2021 2136   HGB 12.7 09/19/2012 0942   HCT 37.5 07/27/2021 2136   HCT 37.5 09/19/2012 0942     Per Protocol for Patient with estCrcl < 30 ml/min and BMI < 40, will transition to Lovenox 30 mg Q24h.

## 2021-07-28 NOTE — ED Notes (Signed)
MD notified of critical Na+ 116.

## 2021-07-28 NOTE — ED Notes (Signed)
Patient eating dinner. Daughter remains at the bedside. Vitals are stable. No acute distress noted.

## 2021-07-28 NOTE — ED Notes (Signed)
Zhang MD notified of critical Na+ 116.

## 2021-07-28 NOTE — Progress Notes (Signed)
MEDICATION RELATED CONSULT NOTE - INITIAL   Pharmacy Consult for Hypertonic Saline  Indication: Na Monitoring   No Known Allergies  Patient Measurements: Height: 5\' 3"  (160 cm) Weight: 45.4 kg (100 lb) IBW/kg (Calculated) : 52.4 Adjusted Body Weight:   Vital Signs: BP: 128/69 (08/08 2030) Pulse Rate: 65 (08/08 2030) Intake/Output from previous day: 08/07 0701 - 08/08 0700 In: 1156.1 [I.V.:156.1; IV Piggyback:1000] Out: 100 [Urine:100] Intake/Output from this shift: Total I/O In: 0  Out: 400 [Urine:400]  Labs: Recent Labs    07/27/21 2136 07/28/21 0428 07/28/21 0653  WBC 8.0  --   --   HGB 13.8  --   --   HCT 37.5  --   --   PLT 323  --   --   APTT  --  27  --   CREATININE 1.25*  --  1.01*  ALBUMIN 4.6  --   --   PROT 7.9  --   --   AST 43*  --   --   ALT 23  --   --   ALKPHOS 53  --   --   BILITOT 1.5*  --   --     Estimated Creatinine Clearance: 23.3 mL/min (A) (by C-G formula based on SCr of 1.01 mg/dL (H)).   Microbiology: Recent Results (from the past 720 hour(s))  Culture, blood (Routine x 2)     Status: None (Preliminary result)   Collection Time: 07/27/21  9:36 PM   Specimen: BLOOD  Result Value Ref Range Status   Specimen Description BLOOD BLOOD LEFT FOREARM  Final   Special Requests   Final    BOTTLES DRAWN AEROBIC AND ANAEROBIC Blood Culture results may not be optimal due to an inadequate volume of blood received in culture bottles   Culture   Final    NO GROWTH < 12 HOURS Performed at [facility], [address], [place], [place] [address]    Report Status PENDING  Incomplete  Resp Panel by RT-PCR (Flu A&B, Covid) Nasopharyngeal Swab     Status: None   Collection Time: 07/27/21  9:36 PM   Specimen: Nasopharyngeal Swab; Nasopharyngeal(NP) swabs in vial transport medium  Result Value Ref Range Status   SARS Coronavirus 2 by RT PCR NEGATIVE NEGATIVE Final    Comment: (NOTE) SARS-CoV-2 target nucleic acids are NOT DETECTED.  The  SARS-CoV-2 RNA is generally detectable in upper respiratory specimens during the acute phase of infection. The lowest concentration of SARS-CoV-2 viral copies this assay can detect is 138 copies/mL. A negative result does not preclude SARS-Cov-2 infection and should not be used as the sole basis for treatment or other patient management decisions. A negative result may occur with  improper specimen collection/handling, submission of specimen other than nasopharyngeal swab, presence of viral mutation(s) within the areas targeted by this assay, and inadequate number of viral copies(<138 copies/mL). A negative result must be combined with clinical observations, patient history, and epidemiological information. The expected result is Negative.  Fact Sheet for Patients:  EntrepreneurPulse.com.au  Fact Sheet for Healthcare Providers:  IncredibleEmployment.be  This test is no t yet approved or cleared by the [place] FDA and  has been authorized for detection and/or diagnosis of SARS-CoV-2 by FDA under an Emergency Use Authorization (EUA). This EUA will remain  in effect (meaning this test can be used) for the duration of the COVID-19 declaration under Section 564(b)(1) of the Act, 21 U.S.C.section 360bbb-3(b)(1), unless the authorization is terminated  or  revoked sooner.       Influenza A by PCR NEGATIVE NEGATIVE Final   Influenza B by PCR NEGATIVE NEGATIVE Final    Comment: (NOTE) The Xpert Xpress SARS-CoV-2/FLU/RSV plus assay is intended as an aid in the diagnosis of influenza from Nasopharyngeal swab specimens and should not be used as a sole basis for treatment. Nasal washings and aspirates are unacceptable for Xpert Xpress SARS-CoV-2/FLU/RSV testing.  Fact Sheet for Patients: EntrepreneurPulse.com.au  Fact Sheet for Healthcare Providers: IncredibleEmployment.be  This test is not yet approved or  cleared by the [place] FDA and has been authorized for detection and/or diagnosis of SARS-CoV-2 by FDA under an Emergency Use Authorization (EUA). This EUA will remain in effect (meaning this test can be used) for the duration of the COVID-19 declaration under Section 564(b)(1) of the Act, 21 U.S.C. section 360bbb-3(b)(1), unless the authorization is terminated or revoked.  Performed at [facility], [address] [place], [place] [address]   Culture, blood (Routine x 2)     Status: None (Preliminary result)   Collection Time: 07/28/21  1:10 AM   Specimen: BLOOD  Result Value Ref Range Status   Specimen Description BLOOD BLOOD RIGHT FOREARM  Final   Special Requests   Final    BOTTLES DRAWN AEROBIC AND ANAEROBIC Blood Culture adequate volume   Culture   Final    NO GROWTH < 12 HOURS Performed at [facility], [address], [place], [place] [address]    Report Status PENDING  Incomplete    Medical History: Past Medical History:  Diagnosis Date   CKD (chronic kidney disease) stage 4, GFR 15-29 ml/min (HCC)    Colon cancer ([place])    Hypertension     Medications:  (Not in a hospital admission)  Assessment: 87 y.o. female with medical history significant for HTN, CKD4, and colon cancer s/p colectomy with colostomy. Pharmacy has been consulted for hypertonic saline.   8/8 0110 = 113 8/8 0428 = 128  (15 mEq/L increase in 3 hrs) -  RN suggested this was due to inaccurate draw so they are repeating Na.    8/8 0528 = 116 ,  previous Na was apparently in error.  8/8 0653 = 116 8/8 0955 = 116 8/8 1439 = 118 8/8 2034 = 122   Goal of Therapy:  Na WNL Max rate of Na correction 8 mEq/L in 24 hrs.   Plan:  -  Will resume 3% NaCl infusion at lower rate of 30 ml/hr.  -  Will check Na Q2H X 3 and then Q4H there after.   Forde Dandy Emaan Gary 07/28/2021,9:14 PM

## 2021-07-28 NOTE — ED Notes (Signed)
Repeat sodium of 116 called from lab. Dr. Damita Dunnings notified face to face, md to place orders to restart hypertonic NS

## 2021-07-28 NOTE — ED Notes (Signed)
Patient is resting comfortably. 

## 2021-07-28 NOTE — ED Notes (Signed)
Patient noted to have a large soft area that appears swollen to left of ostomy site.  Patient is confused and is unable to tell RN if this is normal for her.  Daughter at bedside states she has not idea if this is normal or not, RN inquired as to who takes care of this at home she states she guesses her mom, daughter states she is not sure that patients husband would know either.  There is a small amount of stool from ostomy pencil thin and appears dry, there is air in pouch and RN burped the pouch.  Provider made aware he states he will assess later.

## 2021-07-28 NOTE — ED Notes (Signed)
Patient is resting comfortably, daughter at bedside.

## 2021-07-28 NOTE — Progress Notes (Signed)
ANTICOAGULATION CONSULT NOTE - Initial Consult  Pharmacy Consult for Heparin  Indication: chest pain/ACS  No Known Allergies  Patient Measurements: Height: 5\' 3"  (160 cm) Weight: 45.4 kg (100 lb) IBW/kg (Calculated) : 52.4 Heparin Dosing Weight: 45.4 kg   Vital Signs: BP: 134/67 (08/08 1400) Pulse Rate: 83 (08/08 1400)  Labs: Recent Labs    07/27/21 2136 07/27/21 2234 07/28/21 0110 07/28/21 0428 07/28/21 0653 07/28/21 1215  HGB 13.8  --   --   --   --   --   HCT 37.5  --   --   --   --   --   PLT 323  --   --   --   --   --   APTT  --   --   --  27  --   --   LABPROT 12.5  --   --   --   --   --   INR 0.9  --   --   --   --   --   HEPARINUNFRC  --   --   --   --   --  0.42  CREATININE 1.25*  --   --   --  1.01*  --   TROPONINIHS 128* 187* 476* 406*  --   --      Estimated Creatinine Clearance: 23.3 mL/min (A) (by C-G formula based on SCr of 1.01 mg/dL (H)).   Medical History: Past Medical History:  Diagnosis Date   CKD (chronic kidney disease) stage 4, GFR 15-29 ml/min (HCC)    Colon cancer (HCC)    Hypertension     Medications:  (Not in a hospital admission)  Assessment: 87 y.o. female with medical history significant for HTN, CKD4, and colon cancer s/p colectomy with colostomy. Pharmacy consulted for heparin dosing/monitoring for ACS/NSTEMI.  No prior anticoag noted.   8/08 1215 HL 0.42, therapeutic - continue 550 un/hr  Goal of Therapy:  Heparin level 0.3-0.7 units/ml Monitor platelets by anticoagulation protocol: Yes   Plan:  Continue heparin infusion at 550 units/hr Check anti-Xa level in 8 hours and daily while on heparin Continue to monitor H&H and platelets  Sherilyn Banker, PharmD 07/28/2021,3:59 PM

## 2021-07-28 NOTE — ED Notes (Signed)
Patient awake, wants to know "what that nausea med bottle at home says"  Patient states she is nauseated RN advised that the provider has ordered her some nausea medicine, patient keeps asking what was the med she took at home.  RN gave Zofran IV, patient does not want to take the Coreg states she doesn't take that and she doesn't need that.  RN was able to give med with a sip of water. Daughter at bedside.

## 2021-07-28 NOTE — ED Notes (Signed)
Care transferred, report received from [place], [place]

## 2021-07-28 NOTE — Progress Notes (Signed)
MEDICATION RELATED CONSULT NOTE - INITIAL   Pharmacy Consult for Hypertonic Saline  Indication: Na Monitoring   No Known Allergies  Patient Measurements: Height: 5\' 3"  (160 cm) Weight: 45.4 kg (100 lb) IBW/kg (Calculated) : 52.4 Adjusted Body Weight:   Vital Signs: Temp: 97.7 F (36.5 C) (08/07 2122) Temp Source: Oral (08/07 2122) BP: 154/73 (08/08 0430) Pulse Rate: 90 (08/08 0430) Intake/Output from previous day: 08/07 0701 - 08/08 0700 In: 1000 [IV Piggyback:1000] Out: 100 [Urine:100] Intake/Output from this shift: Total I/O In: 1000 [IV Piggyback:1000] Out: 100 [Urine:100]  Labs: Recent Labs    07/27/21 2136 07/28/21 0428  WBC 8.0  --   HGB 13.8  --   HCT 37.5  --   PLT 323  --   APTT  --  27  CREATININE 1.25*  --   ALBUMIN 4.6  --   PROT 7.9  --   AST 43*  --   ALT 23  --   ALKPHOS 53  --   BILITOT 1.5*  --     Estimated Creatinine Clearance: 18.9 mL/min (A) (by C-G formula based on SCr of 1.25 mg/dL (H)).   Microbiology: Recent Results (from the past 720 hour(s))  Resp Panel by RT-PCR (Flu A&B, Covid) Nasopharyngeal Swab     Status: None   Collection Time: 07/27/21  9:36 PM   Specimen: Nasopharyngeal Swab; Nasopharyngeal(NP) swabs in vial transport medium  Result Value Ref Range Status   SARS Coronavirus 2 by RT PCR NEGATIVE NEGATIVE Final    Comment: (NOTE) SARS-CoV-2 target nucleic acids are NOT DETECTED.  The SARS-CoV-2 RNA is generally detectable in upper respiratory specimens during the acute phase of infection. The lowest concentration of SARS-CoV-2 viral copies this assay can detect is 138 copies/mL. A negative result does not preclude SARS-Cov-2 infection and should not be used as the sole basis for treatment or other patient management decisions. A negative result may occur with  improper specimen collection/handling, submission of specimen other than nasopharyngeal swab, presence of viral mutation(s) within the areas targeted by this  assay, and inadequate number of viral copies(<138 copies/mL). A negative result must be combined with clinical observations, patient history, and epidemiological information. The expected result is Negative.  Fact Sheet for Patients:  EntrepreneurPulse.com.au  Fact Sheet for Healthcare Providers:  IncredibleEmployment.be  This test is no t yet approved or cleared by the [place] FDA and  has been authorized for detection and/or diagnosis of SARS-CoV-2 by FDA under an Emergency Use Authorization (EUA). This EUA will remain  in effect (meaning this test can be used) for the duration of the COVID-19 declaration under Section 564(b)(1) of the Act, 21 U.S.C.section 360bbb-3(b)(1), unless the authorization is terminated  or revoked sooner.       Influenza A by PCR NEGATIVE NEGATIVE Final   Influenza B by PCR NEGATIVE NEGATIVE Final    Comment: (NOTE) The Xpert Xpress SARS-CoV-2/FLU/RSV plus assay is intended as an aid in the diagnosis of influenza from Nasopharyngeal swab specimens and should not be used as a sole basis for treatment. Nasal washings and aspirates are unacceptable for Xpert Xpress SARS-CoV-2/FLU/RSV testing.  Fact Sheet for Patients: EntrepreneurPulse.com.au  Fact Sheet for Healthcare Providers: IncredibleEmployment.be  This test is not yet approved or cleared by the [place] FDA and has been authorized for detection and/or diagnosis of SARS-CoV-2 by FDA under an Emergency Use Authorization (EUA). This EUA will remain in effect (meaning this test can be used) for the duration of the  COVID-19 declaration under Section 564(b)(1) of the Act, 21 U.S.C. section 360bbb-3(b)(1), unless the authorization is terminated or revoked.  Performed at [facility], [address] [place], [place] [address]     Medical History: Past Medical History:  Diagnosis Date   CKD (chronic  kidney disease) stage 4, GFR 15-29 ml/min (HCC)    Colon cancer (HCC)    Hypertension     Medications:  (Not in a hospital admission)   Goal of Therapy:  Na WNL Max rate of Na correction 8 mEq/L in 24 hrs.   Plan:  8/8 0110 = 113 8/8 0428 = 128  (15 mEq/L increase in 3 hrs) -  RN suggested this was due to inaccurate draw so they are repeating Na.     -  Nonetheless, spoke with Dr Damita Dunnings:  will hold 3% NaCl and start DDAVP 2 mg IV Q6H X 24 hrs.  -  Will recheck Na @ ~ 0530 and adjust as needed.   Will check Na Q2H X 3 and then Q4H there after.   Rolonda Pontarelli D 07/28/2021,5:17 AM

## 2021-07-28 NOTE — Progress Notes (Signed)
ANTICOAGULATION CONSULT NOTE - Initial Consult  Pharmacy Consult for Heparin  Indication: chest pain/ACS  No Known Allergies  Patient Measurements: Height: 5\' 3"  (160 cm) Weight: 45.4 kg (100 lb) IBW/kg (Calculated) : 52.4 Heparin Dosing Weight: 45.4 kg   Vital Signs: BP: 128/69 (08/08 2030) Pulse Rate: 65 (08/08 2030)  Labs: Recent Labs    07/27/21 2136 07/27/21 2234 07/28/21 0110 07/28/21 0428 07/28/21 0653 07/28/21 1215 07/28/21 2034  HGB 13.8  --   --   --   --   --   --   HCT 37.5  --   --   --   --   --   --   PLT 323  --   --   --   --   --   --   APTT  --   --   --  27  --   --   --   LABPROT 12.5  --   --   --   --   --   --   INR 0.9  --   --   --   --   --   --   HEPARINUNFRC  --   --   --   --   --  0.42 0.38  CREATININE 1.25*  --   --   --  1.01*  --   --   TROPONINIHS 128* 187* 476* 406*  --   --   --      Estimated Creatinine Clearance: 23.3 mL/min (A) (by C-G formula based on SCr of 1.01 mg/dL (H)).   Medical History: Past Medical History:  Diagnosis Date   CKD (chronic kidney disease) stage 4, GFR 15-29 ml/min (HCC)    Colon cancer (HCC)    Hypertension     Medications:  (Not in a hospital admission)  Assessment: 87 y.o. female with medical history significant for HTN, CKD4, and colon cancer s/p colectomy with colostomy. Pharmacy consulted for heparin dosing/monitoring for ACS/NSTEMI.  No prior anticoag noted.   8/08 1215 HL 0.42, therapeutic - continue 550 un/hr 8/08 2034 HL 0.38, therapeutic x2 - continue 550 un/hr  Goal of Therapy:  Heparin level 0.3-0.7 units/ml Monitor platelets by anticoagulation protocol: Yes   Plan:  HL therapeutic, will continue heparin infusion at 550 units/hr HL therapeutic x2, check anti-Xa level with AM labs Continue to monitor H&H and platelets  Sherilyn Banker, PharmD 07/28/2021,9:10 PM

## 2021-07-28 NOTE — Progress Notes (Signed)
MEDICATION RELATED CONSULT NOTE - INITIAL   Pharmacy Consult for Hypertonic Saline  Indication: Na Monitoring   No Known Allergies  Patient Measurements: Height: 5\' 3"  (160 cm) Weight: 45.4 kg (100 lb) IBW/kg (Calculated) : 52.4 Adjusted Body Weight:   Vital Signs: BP: 134/67 (08/08 1400) Pulse Rate: 83 (08/08 1400) Intake/Output from previous day: 08/07 0701 - 08/08 0700 In: 1156.1 [I.V.:156.1; IV Piggyback:1000] Out: 100 [Urine:100] Intake/Output from this shift: Total I/O In: -  Out: [place]: Recent Labs    07/27/21 2136 07/28/21 0428 07/28/21 0653  WBC 8.0  --   --   HGB 13.8  --   --   HCT 37.5  --   --   PLT 323  --   --   APTT  --  27  --   CREATININE 1.25*  --  1.01*  ALBUMIN 4.6  --   --   PROT 7.9  --   --   AST 43*  --   --   ALT 23  --   --   ALKPHOS 53  --   --   BILITOT 1.5*  --   --     Estimated Creatinine Clearance: 23.3 mL/min (A) (by C-G formula based on SCr of 1.01 mg/dL (H)).   Microbiology: Recent Results (from the past 720 hour(s))  Culture, blood (Routine x 2)     Status: None (Preliminary result)   Collection Time: 07/27/21  9:36 PM   Specimen: BLOOD  Result Value Ref Range Status   Specimen Description BLOOD BLOOD LEFT FOREARM  Final   Special Requests   Final    BOTTLES DRAWN AEROBIC AND ANAEROBIC Blood Culture results may not be optimal due to an inadequate volume of blood received in culture bottles   Culture   Final    NO GROWTH < 12 HOURS Performed at [facility], [address], [place], [place] [address]    Report Status PENDING  Incomplete  Resp Panel by RT-PCR (Flu A&B, Covid) Nasopharyngeal Swab     Status: None   Collection Time: 07/27/21  9:36 PM   Specimen: Nasopharyngeal Swab; Nasopharyngeal(NP) swabs in vial transport medium  Result Value Ref Range Status   SARS Coronavirus 2 by RT PCR NEGATIVE NEGATIVE Final    Comment: (NOTE) SARS-CoV-2 target nucleic acids are NOT DETECTED.  The  SARS-CoV-2 RNA is generally detectable in upper respiratory specimens during the acute phase of infection. The lowest concentration of SARS-CoV-2 viral copies this assay can detect is 138 copies/mL. A negative result does not preclude SARS-Cov-2 infection and should not be used as the sole basis for treatment or other patient management decisions. A negative result may occur with  improper specimen collection/handling, submission of specimen other than nasopharyngeal swab, presence of viral mutation(s) within the areas targeted by this assay, and inadequate number of viral copies(<138 copies/mL). A negative result must be combined with clinical observations, patient history, and epidemiological information. The expected result is Negative.  Fact Sheet for Patients:  EntrepreneurPulse.com.au  Fact Sheet for Healthcare Providers:  IncredibleEmployment.be  This test is no t yet approved or cleared by the [place] FDA and  has been authorized for detection and/or diagnosis of SARS-CoV-2 by FDA under an Emergency Use Authorization (EUA). This EUA will remain  in effect (meaning this test can be used) for the duration of the COVID-19 declaration under Section 564(b)(1) of the Act, 21 U.S.C.section 360bbb-3(b)(1), unless the authorization is terminated  or  revoked sooner.       Influenza A by PCR NEGATIVE NEGATIVE Final   Influenza B by PCR NEGATIVE NEGATIVE Final    Comment: (NOTE) The Xpert Xpress SARS-CoV-2/FLU/RSV plus assay is intended as an aid in the diagnosis of influenza from Nasopharyngeal swab specimens and should not be used as a sole basis for treatment. Nasal washings and aspirates are unacceptable for Xpert Xpress SARS-CoV-2/FLU/RSV testing.  Fact Sheet for Patients: EntrepreneurPulse.com.au  Fact Sheet for Healthcare Providers: IncredibleEmployment.be  This test is not yet approved or  cleared by the [place] FDA and has been authorized for detection and/or diagnosis of SARS-CoV-2 by FDA under an Emergency Use Authorization (EUA). This EUA will remain in effect (meaning this test can be used) for the duration of the COVID-19 declaration under Section 564(b)(1) of the Act, 21 U.S.C. section 360bbb-3(b)(1), unless the authorization is terminated or revoked.  Performed at [facility], [address] [place], [place] [address]   Culture, blood (Routine x 2)     Status: None (Preliminary result)   Collection Time: 07/28/21  1:10 AM   Specimen: BLOOD  Result Value Ref Range Status   Specimen Description BLOOD BLOOD RIGHT FOREARM  Final   Special Requests   Final    BOTTLES DRAWN AEROBIC AND ANAEROBIC Blood Culture adequate volume   Culture   Final    NO GROWTH < 12 HOURS Performed at [facility], [address], [place], [place] [address]    Report Status PENDING  Incomplete    Medical History: Past Medical History:  Diagnosis Date   CKD (chronic kidney disease) stage 4, GFR 15-29 ml/min (HCC)    Colon cancer ([place])    Hypertension     Medications:  (Not in a hospital admission)  Assessment: 87 y.o. female with medical history significant for HTN, CKD4, and colon cancer s/p colectomy with colostomy. Pharmacy has been consulted for hypertonic saline.   8/8 0110 = 113 8/8 0428 = 128  (15 mEq/L increase in 3 hrs) -  RN suggested this was due to inaccurate draw so they are repeating Na.    8/8 0528 = 116 ,  previous Na was apparently in error.  8/8 0653 = 116 8/8 0955 = 116 8/8 1439 = 118   Goal of Therapy:  Na WNL Max rate of Na correction 8 mEq/L in 24 hrs.   Plan:  -  Will resume 3% NaCl infusion at lower rate of 30 ml/hr.  -  Will check Na Q2H X 3 and then Q4H there after.   Forde Dandy Letita Prentiss 07/28/2021,4:03 PM

## 2021-07-28 NOTE — ED Notes (Signed)
Patient is awake complaining of her lower abdomen hurting she points to the center of lower abdomen, states "it feels like an ulcer or something."  Patient still has only a small amount of pencil thin brown stool from her ostomy same as when this RN first evaluated at 1130.  Will notify provider.

## 2021-07-28 NOTE — ED Notes (Signed)
Critical sodium of 113 and serum osmolality of 245 called from lab. Dr. Damita Dunnings notified. New order for 3% ns received and foley.

## 2021-07-28 NOTE — ED Notes (Signed)
Report from [place], rn

## 2021-07-28 NOTE — ED Notes (Signed)
Lab at the bedside to draw Na+ and Heparin level

## 2021-07-28 NOTE — ED Notes (Signed)
Report to jordan, rn.  

## 2021-07-28 NOTE — Progress Notes (Signed)
MEDICATION RELATED CONSULT NOTE - INITIAL   Pharmacy Consult for Hypertonic Saline  Indication: Na Monitoring   No Known Allergies  Patient Measurements: Height: 5\' 3"  (160 cm) Weight: 45.4 kg (100 lb) IBW/kg (Calculated) : 52.4 Adjusted Body Weight:   Vital Signs: Temp: 97.7 F (36.5 C) (08/07 2122) Temp Source: Oral (08/07 2122) BP: 154/76 (08/08 0600) Pulse Rate: 89 (08/08 0602) Intake/Output from previous day: 08/07 0701 - 08/08 0700 In: 1156.1 [I.V.:156.1; IV Piggyback:1000] Out: 100 [Urine:100] Intake/Output from this shift: Total I/O In: 1156.1 [I.V.:156.1; IV Piggyback:1000] Out: 100 [Urine:100]  Labs: Recent Labs    07/27/21 2136 07/28/21 0428  WBC 8.0  --   HGB 13.8  --   HCT 37.5  --   PLT 323  --   APTT  --  27  CREATININE 1.25*  --   ALBUMIN 4.6  --   PROT 7.9  --   AST 43*  --   ALT 23  --   ALKPHOS 53  --   BILITOT 1.5*  --     Estimated Creatinine Clearance: 18.9 mL/min (A) (by C-G formula based on SCr of 1.25 mg/dL (H)).   Microbiology: Recent Results (from the past 720 hour(s))  Culture, blood (Routine x 2)     Status: None (Preliminary result)   Collection Time: 07/27/21  9:36 PM   Specimen: BLOOD  Result Value Ref Range Status   Specimen Description BLOOD BLOOD LEFT FOREARM  Final   Special Requests   Final    BOTTLES DRAWN AEROBIC AND ANAEROBIC Blood Culture results may not be optimal due to an inadequate volume of blood received in culture bottles   Culture   Final    NO GROWTH < 12 HOURS Performed at [facility], [address], [place], [place] [address]    Report Status PENDING  Incomplete  Resp Panel by RT-PCR (Flu A&B, Covid) Nasopharyngeal Swab     Status: None   Collection Time: 07/27/21  9:36 PM   Specimen: Nasopharyngeal Swab; Nasopharyngeal(NP) swabs in vial transport medium  Result Value Ref Range Status   SARS Coronavirus 2 by RT PCR NEGATIVE NEGATIVE Final    Comment: (NOTE) SARS-CoV-2 target nucleic  acids are NOT DETECTED.  The SARS-CoV-2 RNA is generally detectable in upper respiratory specimens during the acute phase of infection. The lowest concentration of SARS-CoV-2 viral copies this assay can detect is 138 copies/mL. A negative result does not preclude SARS-Cov-2 infection and should not be used as the sole basis for treatment or other patient management decisions. A negative result may occur with  improper specimen collection/handling, submission of specimen other than nasopharyngeal swab, presence of viral mutation(s) within the areas targeted by this assay, and inadequate number of viral copies(<138 copies/mL). A negative result must be combined with clinical observations, patient history, and epidemiological information. The expected result is Negative.  Fact Sheet for Patients:  EntrepreneurPulse.com.au  Fact Sheet for Healthcare Providers:  IncredibleEmployment.be  This test is no t yet approved or cleared by the [place] FDA and  has been authorized for detection and/or diagnosis of SARS-CoV-2 by FDA under an Emergency Use Authorization (EUA). This EUA will remain  in effect (meaning this test can be used) for the duration of the COVID-19 declaration under Section 564(b)(1) of the Act, 21 U.S.C.section 360bbb-3(b)(1), unless the authorization is terminated  or revoked sooner.       Influenza A by PCR NEGATIVE NEGATIVE Final   Influenza B by PCR NEGATIVE  NEGATIVE Final    Comment: (NOTE) The Xpert Xpress SARS-CoV-2/FLU/RSV plus assay is intended as an aid in the diagnosis of influenza from Nasopharyngeal swab specimens and should not be used as a sole basis for treatment. Nasal washings and aspirates are unacceptable for Xpert Xpress SARS-CoV-2/FLU/RSV testing.  Fact Sheet for Patients: EntrepreneurPulse.com.au  Fact Sheet for Healthcare Providers: IncredibleEmployment.be  This  test is not yet approved or cleared by the [place] FDA and has been authorized for detection and/or diagnosis of SARS-CoV-2 by FDA under an Emergency Use Authorization (EUA). This EUA will remain in effect (meaning this test can be used) for the duration of the COVID-19 declaration under Section 564(b)(1) of the Act, 21 U.S.C. section 360bbb-3(b)(1), unless the authorization is terminated or revoked.  Performed at [facility], [address] [place], [place] [address]   Culture, blood (Routine x 2)     Status: None (Preliminary result)   Collection Time: 07/28/21  1:10 AM   Specimen: BLOOD  Result Value Ref Range Status   Specimen Description BLOOD BLOOD RIGHT FOREARM  Final   Special Requests   Final    BOTTLES DRAWN AEROBIC AND ANAEROBIC Blood Culture adequate volume   Culture   Final    NO GROWTH < 12 HOURS Performed at [facility], [address], [place], [place] [address]    Report Status PENDING  Incomplete    Medical History: Past Medical History:  Diagnosis Date   CKD (chronic kidney disease) stage 4, GFR 15-29 ml/min (HCC)    Colon cancer ([place])    Hypertension     Medications:  (Not in a hospital admission)   Goal of Therapy:  Na WNL Max rate of Na correction 8 mEq/L in 24 hrs.   Plan:  8/8 0110 = 113 8/8 0428 = 128  (15 mEq/L increase in 3 hrs) -  RN suggested this was due to inaccurate draw so they are repeating Na.    8/8 0528 = 116 ,  previous Na was apparently in error.   -  Will resume 3% NaCl infusion at lower rate of 30 ml/hr.  -  Will recheck Na @ ~ 0800  Will check Na Q2H X 3 and then Q4H there after.   Autumn Johnston D 07/28/2021,6:24 AM

## 2021-07-29 ENCOUNTER — Inpatient Hospital Stay (HOSPITAL_COMMUNITY)
Admit: 2021-07-29 | Discharge: 2021-07-29 | Disposition: A | Discharge status: Home / Self Care | Payer: Medicare Other | Attending: Internal Medicine | Admitting: Internal Medicine

## 2021-07-29 DIAGNOSIS — I214 Non-ST elevation (NSTEMI) myocardial infarction: Secondary | ICD-10-CM | POA: Diagnosis not present

## 2021-07-29 DIAGNOSIS — E871 Hypo-osmolality and hyponatremia: Secondary | ICD-10-CM | POA: Diagnosis present

## 2021-07-29 DIAGNOSIS — I248 Other forms of acute ischemic heart disease: Secondary | ICD-10-CM | POA: Insufficient documentation

## 2021-07-29 DIAGNOSIS — G9341 Metabolic encephalopathy: Secondary | ICD-10-CM

## 2021-07-29 LAB — BASIC METABOLIC PANEL
Anion gap: 8 (ref 5–15)
BUN: 22 mg/dL (ref 8–23)
CO2: 21 mmol/L — ABNORMAL LOW (ref 22–32)
Calcium: 7.9 mg/dL — ABNORMAL LOW (ref 8.9–10.3)
Chloride: 95 mmol/L — ABNORMAL LOW (ref 98–111)
Creatinine, Ser: 1.16 mg/dL — ABNORMAL HIGH (ref 0.44–1.00)
GFR, Estimated: 43 mL/min — ABNORMAL LOW (ref >60)
Glucose, Bld: 76 mg/dL (ref 70–99)
Potassium: 3.8 mmol/L (ref 3.5–5.1)
Sodium: 124 mmol/L — ABNORMAL LOW (ref 135–145)

## 2021-07-29 LAB — CBC WITH DIFFERENTIAL/PLATELET
Abs Immature Granulocytes: 0.02 10*3/uL (ref 0.00–0.07)
Basophils Absolute: 0 10*3/uL (ref 0.0–0.1)
Basophils Relative: 0 %
Eosinophils Absolute: 0 10*3/uL (ref 0.0–0.5)
Eosinophils Relative: 0 %
HCT: 29.2 % — ABNORMAL LOW (ref 36.0–46.0)
Hemoglobin: 10.3 g/dL — ABNORMAL LOW (ref 12.0–15.0)
Immature Granulocytes: 0 %
Lymphocytes Relative: 17 %
Lymphs Abs: 1.1 10*3/uL (ref 0.7–4.0)
MCH: 30.5 pg (ref 26.0–34.0)
MCHC: 35.3 g/dL (ref 30.0–36.0)
MCV: 86.4 fL (ref 80.0–100.0)
Monocytes Absolute: 0.9 10*3/uL (ref 0.1–1.0)
Monocytes Relative: 13 %
Neutro Abs: 4.5 10*3/uL (ref 1.7–7.7)
Neutrophils Relative %: 70 %
Platelets: 238 10*3/uL (ref 150–400)
RBC: 3.38 MIL/uL — ABNORMAL LOW (ref 3.87–5.11)
RDW: 12.7 % (ref 11.5–15.5)
WBC: 6.5 10*3/uL (ref 4.0–10.5)
nRBC: 0 % (ref 0.0–0.2)

## 2021-07-29 LAB — ECHOCARDIOGRAM COMPLETE
AR max vel: 1.98 cm2
AV Area VTI: 2.16 cm2
AV Area mean vel: 2.19 cm2
AV Mean grad: 4 mmHg
AV Peak grad: 8.5 mmHg
Ao pk vel: 1.46 m/s
Area-P 1/2: 4.41 cm2
Height: 63 in
S' Lateral: 1.84 cm
Weight: 1600 oz

## 2021-07-29 LAB — TRANSFERRIN: Transferrin: 188 mg/dL — ABNORMAL LOW (ref 192–382)

## 2021-07-29 LAB — FOLATE: Folate: 18.9 ng/mL (ref >5.9)

## 2021-07-29 LAB — IRON AND TIBC
Iron: 147 ug/dL (ref 28–170)
Saturation Ratios: 56 % — ABNORMAL HIGH (ref 10.4–31.8)
TIBC: 262 ug/dL (ref 250–450)
UIBC: 115 ug/dL

## 2021-07-29 LAB — SODIUM: Sodium: 128 mmol/L — ABNORMAL LOW (ref 135–145)

## 2021-07-29 LAB — MAGNESIUM: Magnesium: 1.6 mg/dL — ABNORMAL LOW (ref 1.7–2.4)

## 2021-07-29 LAB — VITAMIN B12: Vitamin B-12: 257 pg/mL (ref 180–914)

## 2021-07-29 LAB — HEPARIN LEVEL (UNFRACTIONATED): Heparin Unfractionated: 0.57 IU/mL (ref 0.30–0.70)

## 2021-07-29 MED ORDER — ENSURE ENLIVE PO LIQD
237.0000 mL | Freq: Two times a day (BID) | ORAL | Status: DC
  Administered 2021-07-30 – 2021-07-31 (×4): 237 mL via ORAL

## 2021-07-29 MED ORDER — ADULT MULTIVITAMIN W/MINERALS CH
1.0000 | ORAL_TABLET | Freq: Every day | ORAL | Status: DC
  Administered 2021-07-29 – 2021-08-01 (×4): 1 via ORAL
  Filled 2021-07-29 (×4): qty 1

## 2021-07-29 MED ORDER — CHLORHEXIDINE GLUCONATE CLOTH 2 % EX PADS
6.0000 | MEDICATED_PAD | Freq: Every day | CUTANEOUS | Status: DC
  Administered 2021-07-30 – 2021-07-31 (×2): 6 via TOPICAL

## 2021-07-29 MED ORDER — MAGNESIUM SULFATE 2 GM/50ML IV SOLN
2.0000 g | Freq: Once | INTRAVENOUS | Status: AC
  Administered 2021-07-29: 2 g via INTRAVENOUS
  Filled 2021-07-29: qty 50

## 2021-07-29 NOTE — ED Notes (Signed)
Sodium level drawn and sent to lab

## 2021-07-29 NOTE — ED Notes (Signed)
Warm blankets provided for patient.

## 2021-07-29 NOTE — Progress Notes (Signed)
MEDICATION RELATED CONSULT NOTE - INITIAL   Pharmacy Consult for Hypertonic Saline  Indication: Na Monitoring   No Known Allergies  Patient Measurements: Height: 5\' 3"  (160 cm) Weight: 45.4 kg (100 lb) IBW/kg (Calculated) : 52.4 Adjusted Body Weight:   Vital Signs: BP: 133/60 (08/09 0530) Pulse Rate: 62 (08/09 0530) Intake/Output from previous day: 08/08 0701 - 08/09 0700 In: 0  Out: 1525 [Urine:1525] Intake/Output from this shift: Total I/O In: 0  Out: 900 [Urine:900]  Labs: Recent Labs    07/27/21 2136 07/28/21 0428 07/28/21 0653 07/29/21 0515  WBC 8.0  --   --   --   HGB 13.8  --   --   --   HCT 37.5  --   --   --   PLT 323  --   --   --   APTT  --  27  --   --   CREATININE 1.25*  --  1.01* 1.16*  MG  --   --   --  1.6*  ALBUMIN 4.6  --   --   --   PROT 7.9  --   --   --   AST 43*  --   --   --   ALT 23  --   --   --   ALKPHOS 53  --   --   --   BILITOT 1.5*  --   --   --     Estimated Creatinine Clearance: 20.3 mL/min (A) (by C-G formula based on SCr of 1.16 mg/dL (H)).   Microbiology: Recent Results (from the past 720 hour(s))  Culture, blood (Routine x 2)     Status: None (Preliminary result)   Collection Time: 07/27/21  9:36 PM   Specimen: BLOOD  Result Value Ref Range Status   Specimen Description BLOOD BLOOD LEFT FOREARM  Final   Special Requests   Final    BOTTLES DRAWN AEROBIC AND ANAEROBIC Blood Culture results may not be optimal due to an inadequate volume of blood received in culture bottles   Culture   Final    NO GROWTH < 12 HOURS Performed at [facility], [address] [place], [place] [address]    Report Status PENDING  Incomplete  Resp Panel by RT-PCR (Flu A&B, Covid) Nasopharyngeal Swab     Status: None   Collection Time: 07/27/21  9:36 PM   Specimen: Nasopharyngeal Swab; Nasopharyngeal(NP) swabs in vial transport medium  Result Value Ref Range Status   SARS Coronavirus 2 by RT PCR NEGATIVE NEGATIVE Final     Comment: (NOTE) SARS-CoV-2 target nucleic acids are NOT DETECTED.  The SARS-CoV-2 RNA is generally detectable in upper respiratory specimens during the acute phase of infection. The lowest concentration of SARS-CoV-2 viral copies this assay can detect is 138 copies/mL. A negative result does not preclude SARS-Cov-2 infection and should not be used as the sole basis for treatment or other patient management decisions. A negative result may occur with  improper specimen collection/handling, submission of specimen other than nasopharyngeal swab, presence of viral mutation(s) within the areas targeted by this assay, and inadequate number of viral copies(<138 copies/mL). A negative result must be combined with clinical observations, patient history, and epidemiological information. The expected result is Negative.  Fact Sheet for Patients:  EntrepreneurPulse.com.au  Fact Sheet for Healthcare Providers:  IncredibleEmployment.be  This test is no t yet approved or cleared by the [place] FDA and  has been authorized for detection and/or diagnosis  of SARS-CoV-2 by FDA under an Emergency Use Authorization (EUA). This EUA will remain  in effect (meaning this test can be used) for the duration of the COVID-19 declaration under Section 564(b)(1) of the Act, 21 U.S.C.section 360bbb-3(b)(1), unless the authorization is terminated  or revoked sooner.       Influenza A by PCR NEGATIVE NEGATIVE Final   Influenza B by PCR NEGATIVE NEGATIVE Final    Comment: (NOTE) The Xpert Xpress SARS-CoV-2/FLU/RSV plus assay is intended as an aid in the diagnosis of influenza from Nasopharyngeal swab specimens and should not be used as a sole basis for treatment. Nasal washings and aspirates are unacceptable for Xpert Xpress SARS-CoV-2/FLU/RSV testing.  Fact Sheet for Patients: EntrepreneurPulse.com.au  Fact Sheet for Healthcare  Providers: IncredibleEmployment.be  This test is not yet approved or cleared by the [place] FDA and has been authorized for detection and/or diagnosis of SARS-CoV-2 by FDA under an Emergency Use Authorization (EUA). This EUA will remain in effect (meaning this test can be used) for the duration of the COVID-19 declaration under Section 564(b)(1) of the Act, 21 U.S.C. section 360bbb-3(b)(1), unless the authorization is terminated or revoked.  Performed at [facility], [address] [place], [place] [address]   Culture, blood (Routine x 2)     Status: None (Preliminary result)   Collection Time: 07/28/21  1:10 AM   Specimen: BLOOD  Result Value Ref Range Status   Specimen Description BLOOD BLOOD RIGHT FOREARM  Final   Special Requests   Final    BOTTLES DRAWN AEROBIC AND ANAEROBIC Blood Culture adequate volume   Culture   Final    NO GROWTH < 12 HOURS Performed at [facility], [address] [place], [place] [address]    Report Status PENDING  Incomplete    Medical History: Past Medical History:  Diagnosis Date   CKD (chronic kidney disease) stage 4, GFR 15-29 ml/min (HCC)    Colon cancer ([place])    Hypertension     Medications:  (Not in a hospital admission)  Assessment: 87 y.o. female with medical history significant for HTN, CKD4, and colon cancer s/p colectomy with colostomy. Pharmacy has been consulted for hypertonic saline.   8/8 0110 = 113 8/8 0428 = 128  (15 mEq/L increase in 3 hrs) -  RN suggested this was due to inaccurate draw so they are repeating Na.    8/8 0528 = 116 ,  previous Na was apparently in error.  8/8 0653 = 116 8/8 0955 = 116 8/8 1439 = 118 8/8 2034 = 122 8/9 0515 = 124   Goal of Therapy:  Na WNL Max rate of Na correction 8 mEq/L in 24 hrs.   Plan:  -  Will continue 3% NaCl infusion at rate of 30 ml/hr.  -  Will continue checking Na Q4H.   Renda Rolls, PharmD, [facility] 07/29/2021 5:52  AM

## 2021-07-29 NOTE — Progress Notes (Signed)
PROGRESS NOTE    Autumn Johnston  VEL:[id number] DOB: June 11, 1922 DOA: 07/27/2021 PCP: Idelle Crouch, MD   Chief complaint.  Altered mental status. Brief Narrative:  Autumn Johnston is a 87 y.o. female with medical history significant for HTN, CKD4, colon cancer s/p colectomy with colostomy who lives at home with her husband and is independent and takes care of all her financial affairs herself who was brought into the ED with a concern for weakness and altered mental status.  Patient had hypocalcemia with a potassium of 6.4 in May, since that time, he was advised to drink excessive amount of fluids.  She has been drinking large amount of water on a daily basis.  Upon arriving the emergency room, she was found to have altered mental status with potassium 113.  He was given 3% sodium chloride.  Consult from nephrology is also obtained.   Assessment & Plan:   Principal Problem:   Acute hyponatremia Active Problems:   Chronic kidney disease (CKD), stage IV (severe) (HCC)   Elevated troponin   Acute metabolic encephalopathy   Hypertensive urgency   Protein calorie malnutrition ([place])  #1.  Severe hyponatremia. Acute metabolic encephalopathy secondary to hyponatremia. Sodium level gradually improving on 3% sodium chloride infusion. Continue 3% sodium chloride. Monitor sodium level.  2.  Non-STEMI. Continue heparin drip.  Followed by cardiology for  3.  Chronic kidney disease stage IV. Renal function slightly worse today, will loosen up fluid restriction to 1500 mL a day.  4.  Hypertension emergency. Blood pressure stable.  5.  Severe protein calorie malnutrition. Start supplement.     DVT prophylaxis: heparin Code Status: full Family Communication:  Disposition Plan:    Status is: Inpatient  Remains inpatient appropriate because:IV treatments appropriate due to intensity of illness or inability to take PO and Inpatient level of care appropriate due to severity  of illness  Dispo: The patient is from: Home              Anticipated d/c is to: Home              Patient currently is not medically stable to d/c.   Difficult to place patient No        I/O last 3 completed shifts: In: 1156.1 [I.V.:156.1; IV Piggyback:1000] Out: 1925 [Urine:1925] Total I/O In: -  Out: 750 [Urine:750]     Consultants:  Nephrology, cardiology  Procedures: None  Antimicrobials: None   Subjective: Patient has some baseline confusion, no agitation. She denies any abdominal pain nausea vomiting. No fever or chills No dysuria hematuria  No headache or dizziness.  Objective: Vitals:   07/29/21 0430 07/29/21 0530 07/29/21 0622 07/29/21 0923  BP: (!) 106/51 133/60 120/62 122/64  Pulse: 66 62 92 76  Resp: 20 18 18 16   Temp:    98.4 F (36.9 C)  TempSrc:    Oral  SpO2: 99% 100% 97% 97%  Weight:      Height:        Intake/Output Summary (Last 24 hours) at 07/29/2021 1157 Last data filed at 07/29/2021 0804 Gross per 24 hour  Intake 0 ml  Output 1950 ml  Net -1950 ml   Filed Weights   07/27/21 2315  Weight: 45.4 kg    Examination:  General exam: Appears calm and comfortable  Respiratory system: Clear to auscultation. Respiratory effort normal. Cardiovascular system: S1 & S2 heard, RRR. No JVD, murmurs, rubs, gallops or clicks. No pedal edema. Gastrointestinal system: Abdomen is  nondistended, soft and nontender. No organomegaly or masses felt. Normal bowel sounds heard. Central nervous system: Alert and oriented x2. No focal neurological deficits. Extremities: Symmetric 5 x 5 power. Skin: No rashes, lesions or ulcers Psychiatry: Judgement and insight appear normal. Mood & affect appropriate.     Data Reviewed: I have personally reviewed following labs and imaging studies  CBC: Recent Labs  Lab 07/27/21 2136 07/29/21 0515  WBC 8.0 6.5  NEUTROABS 6.9 4.5  HGB 13.8 10.3*  HCT 37.5 29.2*  MCV 83.1 86.4  PLT 323 161   Basic Metabolic  Panel: Recent Labs  Lab 07/27/21 2136 07/28/21 0110 07/28/21 0653 07/28/21 0955 07/28/21 1439 07/28/21 2034 07/29/21 0515  NA 113*   < > 116* 116* 118* 122* 124*  K 4.4  --  3.9  --   --   --  3.8  CL 79*  --  83*  --   --   --  95*  CO2 21*  --  21*  --   --   --  21*  GLUCOSE 119*  --  115*  --   --   --  76  BUN 26*  --  24*  --   --   --  22  CREATININE 1.25*  --  1.01*  --   --   --  1.16*  CALCIUM 9.2  --  8.5*  --   --   --  7.9*  MG  --   --   --   --   --   --  1.6*   < > = values in this interval not displayed.   GFR: Estimated Creatinine Clearance: 20.3 mL/min (A) (by C-G formula based on SCr of 1.16 mg/dL (H)). Liver Function Tests: Recent Labs  Lab 07/27/21 2136  AST 43*  ALT 23  ALKPHOS 53  BILITOT 1.5*  PROT 7.9  ALBUMIN 4.6   Recent Labs  Lab 07/27/21 2136  LIPASE 41   No results for input(s): AMMONIA in the last 168 hours. Coagulation Profile: Recent Labs  Lab 07/27/21 2136  INR 0.9   Cardiac Enzymes: No results for input(s): CKTOTAL, CKMB, CKMBINDEX, TROPONINI in the last 168 hours. BNP (last 3 results) No results for input(s): PROBNP in the last 8760 hours. HbA1C: Recent Labs    07/28/21 1215  HGBA1C 5.6   CBG: No results for input(s): GLUCAP in the last 168 hours. Lipid Profile: Recent Labs    07/28/21 1215  CHOL 270*  HDL 122  LDLCALC 141*  TRIG 37  CHOLHDL 2.2   Thyroid Function Tests: No results for input(s): TSH, T4TOTAL, FREET4, T3FREE, THYROIDAB in the last 72 hours. Anemia Panel: No results for input(s): VITAMINB12, FOLATE, FERRITIN, TIBC, IRON, RETICCTPCT in the last 72 hours. Sepsis Labs: Recent Labs  Lab 07/27/21 2136 07/28/21 0110  LATICACIDVEN 1.8 2.5*    Recent Results (from the past 240 hour(s))  Culture, blood (Routine x 2)     Status: None (Preliminary result)   Collection Time: 07/27/21  9:36 PM   Specimen: BLOOD  Result Value Ref Range Status   Specimen Description BLOOD BLOOD LEFT FOREARM   Final   Special Requests   Final    BOTTLES DRAWN AEROBIC AND ANAEROBIC Blood Culture results may not be optimal due to an inadequate volume of blood received in culture bottles   Culture   Final    NO GROWTH 2 DAYS Performed at [facility], [address] [place],  [place] [address]    Report Status PENDING  Incomplete  Resp Panel by RT-PCR (Flu A&B, Covid) Nasopharyngeal Swab     Status: None   Collection Time: 07/27/21  9:36 PM   Specimen: Nasopharyngeal Swab; Nasopharyngeal(NP) swabs in vial transport medium  Result Value Ref Range Status   SARS Coronavirus 2 by RT PCR NEGATIVE NEGATIVE Final    Comment: (NOTE) SARS-CoV-2 target nucleic acids are NOT DETECTED.  The SARS-CoV-2 RNA is generally detectable in upper respiratory specimens during the acute phase of infection. The lowest concentration of SARS-CoV-2 viral copies this assay can detect is 138 copies/mL. A negative result does not preclude SARS-Cov-2 infection and should not be used as the sole basis for treatment or other patient management decisions. A negative result may occur with  improper specimen collection/handling, submission of specimen other than nasopharyngeal swab, presence of viral mutation(s) within the areas targeted by this assay, and inadequate number of viral copies(<138 copies/mL). A negative result must be combined with clinical observations, patient history, and epidemiological information. The expected result is Negative.  Fact Sheet for Patients:  EntrepreneurPulse.com.au  Fact Sheet for Healthcare Providers:  IncredibleEmployment.be  This test is no t yet approved or cleared by the [place] FDA and  has been authorized for detection and/or diagnosis of SARS-CoV-2 by FDA under an Emergency Use Authorization (EUA). This EUA will remain  in effect (meaning this test can be used) for the duration of the COVID-19 declaration under Section 564(b)(1)  of the Act, 21 U.S.C.section 360bbb-3(b)(1), unless the authorization is terminated  or revoked sooner.       Influenza A by PCR NEGATIVE NEGATIVE Final   Influenza B by PCR NEGATIVE NEGATIVE Final    Comment: (NOTE) The Xpert Xpress SARS-CoV-2/FLU/RSV plus assay is intended as an aid in the diagnosis of influenza from Nasopharyngeal swab specimens and should not be used as a sole basis for treatment. Nasal washings and aspirates are unacceptable for Xpert Xpress SARS-CoV-2/FLU/RSV testing.  Fact Sheet for Patients: EntrepreneurPulse.com.au  Fact Sheet for Healthcare Providers: IncredibleEmployment.be  This test is not yet approved or cleared by the [place] FDA and has been authorized for detection and/or diagnosis of SARS-CoV-2 by FDA under an Emergency Use Authorization (EUA). This EUA will remain in effect (meaning this test can be used) for the duration of the COVID-19 declaration under Section 564(b)(1) of the Act, 21 U.S.C. section 360bbb-3(b)(1), unless the authorization is terminated or revoked.  Performed at [facility], [address] [place], [place] [address]   Culture, blood (Routine x 2)     Status: None (Preliminary result)   Collection Time: 07/28/21  1:10 AM   Specimen: BLOOD  Result Value Ref Range Status   Specimen Description BLOOD BLOOD RIGHT FOREARM  Final   Special Requests   Final    BOTTLES DRAWN AEROBIC AND ANAEROBIC Blood Culture adequate volume   Culture   Final    NO GROWTH 1 DAY Performed at [facility], [address] [place], [place] [address]    Report Status PENDING  Incomplete         Radiology Studies: DG Chest 1 View  Result Date: 07/27/2021 CLINICAL DATA:  Weakness, shortness of breath, nausea EXAM: CHEST  1 VIEW COMPARISON:  03/03/2010 FINDINGS: There is hyperinflation of the lungs compatible with COPD. Biapical scarring. Heart is upper limits normal in size. No  confluent opacities or effusions. No acute bony abnormality. IMPRESSION: COPD/chronic changes.  No active disease. Electronically  Signed   By: Rolm Baptise M.D.   On: 07/27/2021 21:53   CT Head Wo Contrast  Result Date: 07/27/2021 CLINICAL DATA:  Weakness shortness of breath altered EXAM: CT HEAD WITHOUT CONTRAST TECHNIQUE: Contiguous axial images were obtained from the base of the skull through the vertex without intravenous contrast. COMPARISON:  CT brain 09/19/2012 FINDINGS: Brain: No acute territorial infarction, hemorrhage or intracranial mass. Moderate atrophy. Nonenlarged ventricles Vascular: No hyperdense vessels.  Carotid vascular calcification Skull: Normal. Negative for fracture or focal lesion. Sinuses/Orbits: No acute finding. Other: None IMPRESSION: 1. No CT evidence for acute intracranial abnormality. 2. Atrophy Electronically Signed   By: Donavan Foil M.D.   On: 07/27/2021 22:07   CT Angio Chest PE W/Cm &/Or Wo Cm  Result Date: 07/28/2021 CLINICAL DATA:  Weakness and altered mental status EXAM: CT ANGIOGRAPHY CHEST WITH CONTRAST TECHNIQUE: Multidetector CT imaging of the chest was performed using the standard protocol during bolus administration of intravenous contrast. Multiplanar CT image reconstructions and MIPs were obtained to evaluate the vascular anatomy. CONTRAST:  49 mL OMNIPAQUE 350 COMPARISON:  Chest x-ray from the previous day. FINDINGS: Cardiovascular: Atherosclerotic calcifications are noted without aneurysmal dilatation or dissection. No cardiac enlargement is noted. Coronary calcifications are seen. The pulmonary artery shows a normal branching pattern. Pulmonary artery shows a normal branching pattern without evidence of intraluminal filling defect to suggest pulmonary embolism. Mediastinum/Nodes: Thoracic inlet is within normal limits. No sizable hilar or mediastinal adenopathy is noted. The esophagus as visualized is within normal limits. Lungs/Pleura: Lungs are well aerated  bilaterally. Tiny less than 5 mm nodule is noted within the left upper lobe best seen on image number 53 of series 6. Scattered nodules are noted within the right lower lobe measuring approximately 5 mm. Upper Abdomen: Cholelithiasis is seen. Hepatic and renal cysts are noted. No acute abnormality in the upper abdomen is seen. Musculoskeletal: Degenerative changes of the thoracic spine are noted. Increased kyphosis is seen. No acute compression deformity is noted. No rib abnormality is seen. Review of the MIP images confirms the above findings. IMPRESSION: No evidence of pulmonary embolism. Scattered small nodules in the lungs bilaterally. No follow-up needed if patient is low-risk (and has no known or suspected primary neoplasm). Non-contrast chest CT can be considered in 12 months if patient is high-risk. This recommendation follows the consensus statement: Guidelines for Management of Incidental Pulmonary Nodules Detected on CT Images: From the Fleischner Society 2017; Radiology 2017; 284:228-243. Cholelithiasis without complicating factors. Aortic Atherosclerosis (ICD10-I70.0). Electronically Signed   By: Inez Catalina M.D.   On: 07/28/2021 00:40        Scheduled Meds:  amLODipine  5 mg Oral Daily   aspirin EC  81 mg Oral Daily   carvedilol  6.25 mg Oral BID WC   Continuous Infusions:  heparin 550 Units/hr (07/29/21 0305)   sodium chloride (hypertonic) 30 mL/hr at 07/29/21 0305     LOS: 2 days    Time spent: 28 minutes    Sharen Hones, MD [facility]   To contact the attending provider between 7A-7P or the covering provider during after hours 7P-7A, please log into the web site [email or web address] and access using universal [place] password for that web site. If you do not have the password, please call the hospital operator.  07/29/2021, 11:57 AM

## 2021-07-29 NOTE — ED Notes (Signed)
Called lab and requested lab draw for Na+

## 2021-07-29 NOTE — ED Notes (Addendum)
New gown applied to patient at this time.  Patient's eyes cleaned. Patient's eyes present with green mucous.

## 2021-07-29 NOTE — Progress Notes (Signed)
[facility]  ROUNDING NOTE   Subjective:   Autumn Johnston is a 87 y.o. female presenting to the ED with weakness, shortness of breath and nausea. She has been admitted for Hyponatremia [E87.1]   Patient seen this morning resting in bed Alert to self and place Poor appetite, Ensure ordered Denies shortness of breath  IVF 3% NS at 30  Objective:  Vital signs in last 24 hours:  Temp:  [97.8 F (36.6 C)-98.4 F (36.9 C)] 97.8 F (36.6 C) (08/09 1256) Pulse Rate:  [36-108] 72 (08/09 1256) Resp:  [12-27] 18 (08/09 1256) BP: (105-153)/(51-74) 114/51 (08/09 1256) SpO2:  [89 %-100 %] 98 % (08/09 1256)  Weight change:  Filed Weights   07/27/21 2315  Weight: 45.4 kg    Intake/Output: I/O last 3 completed shifts: In: 1156.1 [I.V.:156.1; IV Piggyback:1000] Out: 1925 [Urine:1925]   Intake/Output this shift:  Total I/O In: -  Out: 750 [Urine:750]  Physical Exam: General: NAD, laying in bed  Head: Normocephalic, atraumatic. Moist oral mucosal membranes  Eyes: Anicteric  Lungs:  Clear to auscultation, normal effort  Heart: Regular rate and rhythm  Abdomen:  Soft, nontender  Extremities:  no peripheral edema.  Neurologic: Alert to person and place, moving all four extremities  Skin: No lesions       Basic Metabolic Panel: Recent Labs  Lab 07/27/21 2136 07/28/21 0110 07/28/21 0653 07/28/21 0955 07/28/21 1439 07/28/21 2034 07/29/21 0515  NA 113*   < > 116* 116* 118* 122* 124*  K 4.4  --  3.9  --   --   --  3.8  CL 79*  --  83*  --   --   --  95*  CO2 21*  --  21*  --   --   --  21*  GLUCOSE 119*  --  115*  --   --   --  76  BUN 26*  --  24*  --   --   --  22  CREATININE 1.25*  --  1.01*  --   --   --  1.16*  CALCIUM 9.2  --  8.5*  --   --   --  7.9*  MG  --   --   --   --   --   --  1.6*   < > = values in this interval not displayed.    Liver Function Tests: Recent Labs  Lab 07/27/21 2136  AST 43*  ALT 23  ALKPHOS 53  BILITOT 1.5*   PROT 7.9  ALBUMIN 4.6   Recent Labs  Lab 07/27/21 2136  LIPASE 41   No results for input(s): AMMONIA in the last 168 hours.  CBC: Recent Labs  Lab 07/27/21 2136 07/29/21 0515  WBC 8.0 6.5  NEUTROABS 6.9 4.5  HGB 13.8 10.3*  HCT 37.5 29.2*  MCV 83.1 86.4  PLT 323 238    Cardiac Enzymes: No results for input(s): CKTOTAL, CKMB, CKMBINDEX, TROPONINI in the last 168 hours.  BNP: Invalid input(s): POCBNP  CBG: No results for input(s): GLUCAP in the last 168 hours.  Microbiology: Results for orders placed or performed during the hospital encounter of 07/27/21  Culture, blood (Routine x 2)     Status: None (Preliminary result)   Collection Time: 07/27/21  9:36 PM   Specimen: BLOOD  Result Value Ref Range Status   Specimen Description BLOOD BLOOD LEFT FOREARM  Final   Special Requests   Final  BOTTLES DRAWN AEROBIC AND ANAEROBIC Blood Culture results may not be optimal due to an inadequate volume of blood received in culture bottles   Culture   Final    NO GROWTH 2 DAYS Performed at [facility], [address] [place], [place] [address]    Report Status PENDING  Incomplete  Resp Panel by RT-PCR (Flu A&B, Covid) Nasopharyngeal Swab     Status: None   Collection Time: 07/27/21  9:36 PM   Specimen: Nasopharyngeal Swab; Nasopharyngeal(NP) swabs in vial transport medium  Result Value Ref Range Status   SARS Coronavirus 2 by RT PCR NEGATIVE NEGATIVE Final    Comment: (NOTE) SARS-CoV-2 target nucleic acids are NOT DETECTED.  The SARS-CoV-2 RNA is generally detectable in upper respiratory specimens during the acute phase of infection. The lowest concentration of SARS-CoV-2 viral copies this assay can detect is 138 copies/mL. A negative result does not preclude SARS-Cov-2 infection and should not be used as the sole basis for treatment or other patient management decisions. A negative result may occur with  improper specimen collection/handling, submission of  specimen other than nasopharyngeal swab, presence of viral mutation(s) within the areas targeted by this assay, and inadequate number of viral copies(<138 copies/mL). A negative result must be combined with clinical observations, patient history, and epidemiological information. The expected result is Negative.  Fact Sheet for Patients:  EntrepreneurPulse.com.au  Fact Sheet for Healthcare Providers:  IncredibleEmployment.be  This test is no t yet approved or cleared by the [place] FDA and  has been authorized for detection and/or diagnosis of SARS-CoV-2 by FDA under an Emergency Use Authorization (EUA). This EUA will remain  in effect (meaning this test can be used) for the duration of the COVID-19 declaration under Section 564(b)(1) of the Act, 21 U.S.C.section 360bbb-3(b)(1), unless the authorization is terminated  or revoked sooner.       Influenza A by PCR NEGATIVE NEGATIVE Final   Influenza B by PCR NEGATIVE NEGATIVE Final    Comment: (NOTE) The Xpert Xpress SARS-CoV-2/FLU/RSV plus assay is intended as an aid in the diagnosis of influenza from Nasopharyngeal swab specimens and should not be used as a sole basis for treatment. Nasal washings and aspirates are unacceptable for Xpert Xpress SARS-CoV-2/FLU/RSV testing.  Fact Sheet for Patients: EntrepreneurPulse.com.au  Fact Sheet for Healthcare Providers: IncredibleEmployment.be  This test is not yet approved or cleared by the [place] FDA and has been authorized for detection and/or diagnosis of SARS-CoV-2 by FDA under an Emergency Use Authorization (EUA). This EUA will remain in effect (meaning this test can be used) for the duration of the COVID-19 declaration under Section 564(b)(1) of the Act, 21 U.S.C. section 360bbb-3(b)(1), unless the authorization is terminated or revoked.  Performed at [facility], [address] [place], [place] [address]   Culture, blood (Routine x 2)     Status: None (Preliminary result)   Collection Time: 07/28/21  1:10 AM   Specimen: BLOOD  Result Value Ref Range Status   Specimen Description BLOOD BLOOD RIGHT FOREARM  Final   Special Requests   Final    BOTTLES DRAWN AEROBIC AND ANAEROBIC Blood Culture adequate volume   Culture   Final    NO GROWTH 1 DAY Performed at [facility], [address], [place], [place] [address]    Report Status PENDING  Incomplete    Coagulation Studies: Recent Labs    07/27/21 2135-03-31  LABPROT 12.5  INR 0.9    Urinalysis: Recent Labs  07/27/21 2136  COLORURINE STRAW*  LABSPEC 1.009  PHURINE 6.0  GLUCOSEU NEGATIVE  HGBUR MODERATE*  BILIRUBINUR NEGATIVE  KETONESUR 5*  PROTEINUR 100*  NITRITE NEGATIVE  LEUKOCYTESUR NEGATIVE      Imaging: DG Chest 1 View  Result Date: 07/27/2021 CLINICAL DATA:  Weakness, shortness of breath, nausea EXAM: CHEST  1 VIEW COMPARISON:  03/03/2010 FINDINGS: There is hyperinflation of the lungs compatible with COPD. Biapical scarring. Heart is upper limits normal in size. No confluent opacities or effusions. No acute bony abnormality. IMPRESSION: COPD/chronic changes.  No active disease. Electronically Signed   By: Rolm Baptise M.D.   On: 07/27/2021 21:53   CT Head Wo Contrast  Result Date: 07/27/2021 CLINICAL DATA:  Weakness shortness of breath altered EXAM: CT HEAD WITHOUT CONTRAST TECHNIQUE: Contiguous axial images were obtained from the base of the skull through the vertex without intravenous contrast. COMPARISON:  CT brain 09/19/2012 FINDINGS: Brain: No acute territorial infarction, hemorrhage or intracranial mass. Moderate atrophy. Nonenlarged ventricles Vascular: No hyperdense vessels.  Carotid vascular calcification Skull: Normal. Negative for fracture or focal lesion. Sinuses/Orbits: No acute finding. Other: None IMPRESSION: 1. No CT evidence for acute intracranial abnormality. 2.  Atrophy Electronically Signed   By: Donavan Foil M.D.   On: 07/27/2021 22:07   CT Angio Chest PE W/Cm &/Or Wo Cm  Result Date: 07/28/2021 CLINICAL DATA:  Weakness and altered mental status EXAM: CT ANGIOGRAPHY CHEST WITH CONTRAST TECHNIQUE: Multidetector CT imaging of the chest was performed using the standard protocol during bolus administration of intravenous contrast. Multiplanar CT image reconstructions and MIPs were obtained to evaluate the vascular anatomy. CONTRAST:  49 mL OMNIPAQUE 350 COMPARISON:  Chest x-ray from the previous day. FINDINGS: Cardiovascular: Atherosclerotic calcifications are noted without aneurysmal dilatation or dissection. No cardiac enlargement is noted. Coronary calcifications are seen. The pulmonary artery shows a normal branching pattern. Pulmonary artery shows a normal branching pattern without evidence of intraluminal filling defect to suggest pulmonary embolism. Mediastinum/Nodes: Thoracic inlet is within normal limits. No sizable hilar or mediastinal adenopathy is noted. The esophagus as visualized is within normal limits. Lungs/Pleura: Lungs are well aerated bilaterally. Tiny less than 5 mm nodule is noted within the left upper lobe best seen on image number 53 of series 6. Scattered nodules are noted within the right lower lobe measuring approximately 5 mm. Upper Abdomen: Cholelithiasis is seen. Hepatic and renal cysts are noted. No acute abnormality in the upper abdomen is seen. Musculoskeletal: Degenerative changes of the thoracic spine are noted. Increased kyphosis is seen. No acute compression deformity is noted. No rib abnormality is seen. Review of the MIP images confirms the above findings. IMPRESSION: No evidence of pulmonary embolism. Scattered small nodules in the lungs bilaterally. No follow-up needed if patient is low-risk (and has no known or suspected primary neoplasm). Non-contrast chest CT can be considered in 12 months if patient is high-risk. This  recommendation follows the consensus statement: Guidelines for Management of Incidental Pulmonary Nodules Detected on CT Images: From the Fleischner Society 2017; Radiology 2017; 284:228-243. Cholelithiasis without complicating factors. Aortic Atherosclerosis (ICD10-I70.0). Electronically Signed   By: Inez Catalina M.D.   On: 07/28/2021 00:40   ECHOCARDIOGRAM COMPLETE  Result Date: 07/29/2021    ECHOCARDIOGRAM REPORT   Patient Name:   Autumn Johnston Date of Exam: 07/29/2021 Medical Rec #:  [id number]           Height:       63.0 in Accession #:    [id number]  Weight:       100.0 lb Date of Birth:  [birth year]           BSA:          1.440 m Patient Age:    73 years            BP:           122/64 mmHg Patient Gender: F                   HR:           76 bpm. Exam Location:  [facility] Procedure: 2D Echo, Color Doppler and Cardiac Doppler Indications:     NSTEMI I21.4  History:         Patient has no prior history of Echocardiogram examinations.                  Risk Factors:Hypertension. CKD.  Sonographer:     Sherrie Sport RDCS (AE) Referring Phys:  [id number] Athena Masse Diagnosing Phys: Kate Sable MD  Sonographer Comments: Technically challenging study due to limited acoustic windows. Image acquisition challenging due to patient body habitus and Pt could not lie on left side nor supine. IMPRESSIONS  1. Left ventricular ejection fraction, by estimation, is 60 to 65%. The left ventricle has normal function. The left ventricle has no regional wall motion abnormalities. There is mild left ventricular hypertrophy. Left ventricular diastolic parameters are consistent with Grade II diastolic dysfunction (pseudonormalization).  2. Right ventricular systolic function is normal. The right ventricular size is normal.  3. Left atrial size was moderately dilated.  4. The mitral valve is degenerative. Mild mitral valve regurgitation.  5. The aortic valve is tricuspid. Aortic valve regurgitation is not visualized. Mild  to moderate aortic valve sclerosis/calcification is present, without any evidence of aortic stenosis. FINDINGS  Left Ventricle: Left ventricular ejection fraction, by estimation, is 60 to 65%. The left ventricle has normal function. The left ventricle has no regional wall motion abnormalities. The left ventricular internal cavity size was normal in size. There is  mild left ventricular hypertrophy. Left ventricular diastolic parameters are consistent with Grade II diastolic dysfunction (pseudonormalization). Right Ventricle: The right ventricular size is normal. No increase in right ventricular wall thickness. Right ventricular systolic function is normal. Left Atrium: Left atrial size was moderately dilated. Right Atrium: Right atrial size was normal in size. Pericardium: There is no evidence of pericardial effusion. Mitral Valve: The mitral valve is degenerative in appearance. There is moderate calcification of the mitral valve leaflet(s). Mild mitral valve regurgitation. Tricuspid Valve: The tricuspid valve is not well visualized. Tricuspid valve regurgitation is mild. Aortic Valve: The aortic valve is tricuspid. Aortic valve regurgitation is not visualized. Mild to moderate aortic valve sclerosis/calcification is present, without any evidence of aortic stenosis. Aortic valve mean gradient measures 4.0 mmHg. Aortic valve peak gradient measures 8.5 mmHg. Aortic valve area, by VTI measures 2.16 cm. Pulmonic Valve: The pulmonic valve was not well visualized. Pulmonic valve regurgitation is not visualized. Aorta: The aortic root is normal in size and structure. Venous: The inferior vena cava was not well visualized. IAS/Shunts: There is right bowing of the interatrial septum, suggestive of elevated left atrial pressure. No atrial level shunt detected by color flow Doppler.  LEFT VENTRICLE PLAX 2D LVIDd:         2.92 cm  Diastology LVIDs:         1.84 cm  LV e' medial:    2.61  cm/s LV PW:         1.34 cm  LV E/e'  medial:  17.4 LV IVS:        1.50 cm  LV e' lateral:   5.33 cm/s LVOT diam:     2.00 cm  LV E/e' lateral: 8.5 LV SV:         55 LV SV Index:   38 LVOT Area:     3.14 cm  LEFT ATRIUM             Index       RIGHT ATRIUM           Index LA diam:        3.90 cm 2.71 cm/m  RA Area:     10.50 cm LA Vol (A2C):   39.4 ml 27.36 ml/m RA Volume:   19.70 ml  13.68 ml/m LA Vol (A4C):   52.2 ml 36.24 ml/m LA Biplane Vol: 47.1 ml 32.70 ml/m  AORTIC VALVE                   PULMONIC VALVE AV Area (Vmax):    1.98 cm    PV Vmax:        0.51 m/s AV Area (Vmean):   2.19 cm    PV Peak grad:   1.0 mmHg AV Area (VTI):     2.16 cm    RVOT Peak grad: 1 mmHg AV Vmax:           146.00 cm/s AV Vmean:          94.400 cm/s AV VTI:            0.253 m AV Peak Grad:      8.5 mmHg AV Mean Grad:      4.0 mmHg LVOT Vmax:         92.00 cm/s LVOT Vmean:        65.800 cm/s LVOT VTI:          0.174 m LVOT/AV VTI ratio: 0.69  AORTA Ao Root diam: 3.20 cm MITRAL VALVE MV Area (PHT): 4.41 cm    SHUNTS MV Decel Time: 172 msec    Systemic VTI:  0.17 m MV E velocity: 45.40 cm/s  Systemic Diam: 2.00 cm MV A velocity: 39.80 cm/s MV E/A ratio:  1.14 Kate Sable MD Electronically signed by Kate Sable MD Signature Date/Time: 07/29/2021/3:00:57 PM    Final      Medications:    heparin 550 Units/hr (07/29/21 0305)   sodium chloride (hypertonic) 30 mL/hr at 07/29/21 0305    amLODipine  5 mg Oral Daily   aspirin EC  81 mg Oral Daily   carvedilol  6.25 mg Oral BID WC   feeding supplement  237 mL Oral BID BM   multivitamin with minerals  1 tablet Oral Daily   acetaminophen **OR** acetaminophen, haloperidol lactate, ondansetron **OR** ondansetron (ZOFRAN) IV  Assessment/ Plan:  Ms. Jahmiyah Dullea is a 87 y.o.  female presenting to the ED with weakness, shortness of breath and nausea. She has been admitted for Hyponatremia [E87.1]   #Acute hyponatremia Likely secondary to "tea and toast diet".  Patient's appetite has been poor  as outpatient but she has been drinking increased amounts of plain water.  This may have contributed to Developing hyponatremia over time.  Review of outpatient records show that her sodium level was normal in May 2022.  Mildly decreased sodium of 132 noted in July, further decrease noted on  July 27 with sodium level of 127.  TSH normal.  CT with IV contrast without any evidence of lung mass.   Agree with 3% (hypertonic) saline administration at low-dose Currently sodium will be monitored every 4 hours and dose of hypertonic saline adjusted accordingly. Patient alert to place and person Continue to monitor neuro status Continues poor appetite, Ensure ordered BID Sent secure chat to nursing requesting updated sodium     #Acute kidney injury Outpatient creatinine has been higher Unclear cause but ? Related to losartan. Currently on hold.  Creatinine level 1.16 Will follow    LOS: 2   8/9/20223:38 PM

## 2021-07-29 NOTE — Progress Notes (Signed)
Progress Note  Patient Name: Autumn Johnston Date of Encounter: 07/29/2021  [facility] Cardiologist: New  Subjective   Patient still confused and wanting to get out of her bed. She denies chest pain. Sodium level 124 today.  Inpatient Medications    Scheduled Meds:  amLODipine  5 mg Oral Daily   aspirin EC  81 mg Oral Daily   carvedilol  6.25 mg Oral BID WC   Continuous Infusions:  heparin 550 Units/hr (07/29/21 0305)   sodium chloride (hypertonic) 30 mL/hr at 07/29/21 0305   PRN Meds: acetaminophen **OR** acetaminophen, haloperidol lactate, ondansetron **OR** ondansetron (ZOFRAN) IV   Vital Signs    Vitals:   07/29/21 0430 07/29/21 0530 07/29/21 0622 07/29/21 0923  BP: (!) 106/51 133/60 120/62 122/64  Pulse: 66 62 92 76  Resp: 20 18 18 16   Temp:    98.4 F (36.9 C)  TempSrc:    Oral  SpO2: 99% 100% 97% 97%  Weight:      Height:        Intake/Output Summary (Last 24 hours) at 07/29/2021 1056 Last data filed at 07/29/2021 0804 Gross per 24 hour  Intake 0 ml  Output 1950 ml  Net -1950 ml   Last 3 Weights 07/27/2021  Weight (lbs) 100 lb  Weight (kg) 45.36 kg      Telemetry    NSR, HR 60-70, PACs/PVcs - Personally Reviewed  ECG    NO new - Personally Reviewed  Physical Exam   GEN: No acute distress.   Neck: No JVD Cardiac: RRR, no murmurs, rubs, or gallops.  Respiratory: Clear to auscultation bilaterally. GI: Soft, nontender, non-distended  MS: No edema; No deformity. Neuro:  A&O x1 Psych: Normal affect   Labs    High Sensitivity Troponin:   Recent Labs  Lab 07/27/21 2136 07/27/21 2234 07/28/21 0110 07/28/21 0428  TROPONINIHS 128* 187* 476* 406*      Chemistry Recent Labs  Lab 07/27/21 2136 07/28/21 0110 07/28/21 0653 07/28/21 0955 07/28/21 1439 07/28/21 2034 07/29/21 0515  NA 113*   < > 116*   < > 118* 122* 124*  K 4.4  --  3.9  --   --   --  3.8  CL 79*  --  83*  --   --   --  95*  CO2 21*  --  21*  --   --   --  21*   GLUCOSE 119*  --  115*  --   --   --  76  BUN 26*  --  24*  --   --   --  22  CREATININE 1.25*  --  1.01*  --   --   --  1.16*  CALCIUM 9.2  --  8.5*  --   --   --  7.9*  PROT 7.9  --   --   --   --   --   --   ALBUMIN 4.6  --   --   --   --   --   --   AST 43*  --   --   --   --   --   --   ALT 23  --   --   --   --   --   --   ALKPHOS 53  --   --   --   --   --   --   BILITOT 1.5*  --   --   --   --   --   --  GFRNONAA 39*  --  51*  --   --   --  43*  ANIONGAP 13  --  12  --   --   --  8   < > = values in this interval not displayed.     Hematology Recent Labs  Lab 07/27/21 2136 07/29/21 0515  WBC 8.0 6.5  RBC 4.51 3.38*  HGB 13.8 10.3*  HCT 37.5 29.2*  MCV 83.1 86.4  MCH 30.6 30.5  MCHC 36.8* 35.3  RDW 12.5 12.7  PLT 323 238    BNP Recent Labs  Lab 07/27/21 2136  BNP 260.0*     DDimer No results for input(s): DDIMER in the last 168 hours.   Radiology    DG Chest 1 View  Result Date: 07/27/2021 CLINICAL DATA:  Weakness, shortness of breath, nausea EXAM: CHEST  1 VIEW COMPARISON:  03/03/2010 FINDINGS: There is hyperinflation of the lungs compatible with COPD. Biapical scarring. Heart is upper limits normal in size. No confluent opacities or effusions. No acute bony abnormality. IMPRESSION: COPD/chronic changes.  No active disease. Electronically Signed   By: Rolm Baptise M.D.   On: 07/27/2021 21:53   CT Head Wo Contrast  Result Date: 07/27/2021 CLINICAL DATA:  Weakness shortness of breath altered EXAM: CT HEAD WITHOUT CONTRAST TECHNIQUE: Contiguous axial images were obtained from the base of the skull through the vertex without intravenous contrast. COMPARISON:  CT brain 09/19/2012 FINDINGS: Brain: No acute territorial infarction, hemorrhage or intracranial mass. Moderate atrophy. Nonenlarged ventricles Vascular: No hyperdense vessels.  Carotid vascular calcification Skull: Normal. Negative for fracture or focal lesion. Sinuses/Orbits: No acute finding. Other: None  IMPRESSION: 1. No CT evidence for acute intracranial abnormality. 2. Atrophy Electronically Signed   By: Donavan Foil M.D.   On: 07/27/2021 22:07   CT Angio Chest PE W/Cm &/Or Wo Cm  Result Date: 07/28/2021 CLINICAL DATA:  Weakness and altered mental status EXAM: CT ANGIOGRAPHY CHEST WITH CONTRAST TECHNIQUE: Multidetector CT imaging of the chest was performed using the standard protocol during bolus administration of intravenous contrast. Multiplanar CT image reconstructions and MIPs were obtained to evaluate the vascular anatomy. CONTRAST:  49 mL OMNIPAQUE 350 COMPARISON:  Chest x-ray from the previous day. FINDINGS: Cardiovascular: Atherosclerotic calcifications are noted without aneurysmal dilatation or dissection. No cardiac enlargement is noted. Coronary calcifications are seen. The pulmonary artery shows a normal branching pattern. Pulmonary artery shows a normal branching pattern without evidence of intraluminal filling defect to suggest pulmonary embolism. Mediastinum/Nodes: Thoracic inlet is within normal limits. No sizable hilar or mediastinal adenopathy is noted. The esophagus as visualized is within normal limits. Lungs/Pleura: Lungs are well aerated bilaterally. Tiny less than 5 mm nodule is noted within the left upper lobe best seen on image number 53 of series 6. Scattered nodules are noted within the right lower lobe measuring approximately 5 mm. Upper Abdomen: Cholelithiasis is seen. Hepatic and renal cysts are noted. No acute abnormality in the upper abdomen is seen. Musculoskeletal: Degenerative changes of the thoracic spine are noted. Increased kyphosis is seen. No acute compression deformity is noted. No rib abnormality is seen. Review of the MIP images confirms the above findings. IMPRESSION: No evidence of pulmonary embolism. Scattered small nodules in the lungs bilaterally. No follow-up needed if patient is low-risk (and has no known or suspected primary neoplasm). Non-contrast chest CT  can be considered in 12 months if patient is high-risk. This recommendation follows the consensus statement: Guidelines for Management of Incidental Pulmonary Nodules Detected  on CT Images: From the Fleischner Society 2017; Radiology 2017; [phone]. Cholelithiasis without complicating factors. Aortic Atherosclerosis (ICD10-I70.0). Electronically Signed   By: Inez Catalina M.D.   On: 07/28/2021 00:40    Cardiac Studies   Echo pending  Patient Profile     87 y.o. female  with a hx of HTN, HLD, CKD stage 4, colon cancer with colostomy  who is being seen 07/28/2021 for the evaluation of elevated troponin.  Assessment & Plan    Hyponatremia - Sodium 113 on arrival. Level was normal 7/27 labs - s/p IVF and saline solution - nephrology following, suspect low sodium in the setting of polydipsia/ Recommend 3% hypertonic solution - Sodium level today 124   Acute encephalopathy - Unclear reason for AMS. No baseline dementia reported by daughter - She is A&O x1. - CT head without acute abnormality - Blood culture collected   Elevated troponin - in the setting of AMS, electrolyte abnormality, and severe HTN - Hs trop peak 476, now down trending - EKG showed NSR, 88bpm, and nonspecific T wave changes - No chest pain reported - RF include HTN and HLD - CT chest showed coronary atherosclerosis - IV heparin started - echo ordered - start ASA 81mg  daily and coreg 6.25mg  BID. Continue statin - Further work-up pending echo. Continue IV heparin for 48 hours. Patient is not a good candidate for ischemic evaluation given mental status, age and comorbidities. Also she is asymptomatic and would not likely improve quality of life.     Hypertensive urgency H/o HTN - BP 199/99 on arrival, suspected not on medications due to AMS - PTA losartan 100mg  daily amlodipine 5mg . ARB held for CKD - start coreg as above - Bps improved    HLD - PTA simvastatin - lipid panel showed LDL 141, continue statin at  discharge   CKD stage IV - Cr 1.25 on admission. Improved s/p IVF - losartan held on admission - daily BMET   H/o colon cancer s/p colectomy with colostomy - per IM  For questions or updates, please contact [place] Please consult [email or web address] for contact info under        Signed, Shania Bjelland Ninfa Meeker, PA-C  07/29/2021, 10:56 AM

## 2021-07-29 NOTE — ED Notes (Signed)
Called dietary to request Ensure

## 2021-07-29 NOTE — ED Notes (Signed)
Answered patient's call light-- provided patient additional blankets.

## 2021-07-29 NOTE — Progress Notes (Signed)
ANTICOAGULATION CONSULT NOTE - Initial Consult  Pharmacy Consult for Heparin  Indication: chest pain/ACS  No Known Allergies  Patient Measurements: Height: 5\' 3"  (160 cm) Weight: 45.4 kg (100 lb) IBW/kg (Calculated) : 52.4 Heparin Dosing Weight: 45.4 kg   Vital Signs: BP: 133/60 (08/09 0530) Pulse Rate: 62 (08/09 0530)  Labs: Recent Labs    07/27/21 2136 07/27/21 2234 07/28/21 0110 07/28/21 0428 07/28/21 0653 07/28/21 1215 07/28/21 2034 07/29/21 0515  HGB 13.8  --   --   --   --   --   --   --   HCT 37.5  --   --   --   --   --   --   --   PLT 323  --   --   --   --   --   --   --   APTT  --   --   --  27  --   --   --   --   LABPROT 12.5  --   --   --   --   --   --   --   INR 0.9  --   --   --   --   --   --   --   HEPARINUNFRC  --   --   --   --   --  0.42 0.38 0.57  CREATININE 1.25*  --   --   --  1.01*  --   --  1.16*  TROPONINIHS 128* 187* 476* 406*  --   --   --   --      Estimated Creatinine Clearance: 20.3 mL/min (A) (by C-G formula based on SCr of 1.16 mg/dL (H)).   Medical History: Past Medical History:  Diagnosis Date   CKD (chronic kidney disease) stage 4, GFR 15-29 ml/min (HCC)    Colon cancer (HCC)    Hypertension     Medications:  (Not in a hospital admission)  Assessment: 87 y.o. female with medical history significant for HTN, CKD4, and colon cancer s/p colectomy with colostomy. Pharmacy consulted for heparin dosing/monitoring for ACS/NSTEMI.  No prior anticoag noted.   8/08 1215 HL 0.42, therapeutic - continue 550 un/hr 8/08 2034 HL 0.38, therapeutic x2 - continue 550 un/hr 8/09 0515 HL 0.57, therapeutic x 3  Goal of Therapy:  Heparin level 0.3-0.7 units/ml Monitor platelets by anticoagulation protocol: Yes   Plan:  Will continue heparin infusion at 550 units/hr Recheck HL daily with AM labs Continue to monitor H&H and platelets  Renda Rolls, PharmD, [facility] 07/29/2021 5:55 AM

## 2021-07-29 NOTE — Progress Notes (Signed)
[place] NOTE  Pharmacy Consult for Hypertonic Saline  Indication: Na Monitoring   Patient Measurements: Height: 5\' 3"  (160 cm) Weight: 45.4 kg (100 lb) IBW/kg (Calculated) : 52.4  Intake/Output from previous day: 08/08 0701 - 08/09 0700 In: 0  Out: 1825 [Urine:1825] Intake/Output from this shift: Total I/O In: -  Out: 750 [Urine:750]  Labs: Recent Labs    07/27/21 2136 07/28/21 0428 07/28/21 0653 07/29/21 0515  WBC 8.0  --   --  6.5  HGB 13.8  --   --  10.3*  HCT 37.5  --   --  29.2*  PLT 323  --   --  238  APTT  --  27  --   --   CREATININE 1.25*  --  1.01* 1.16*  MG  --   --   --  1.6*  ALBUMIN 4.6  --   --   --   PROT 7.9  --   --   --   AST 43*  --   --   --   ALT 23  --   --   --   ALKPHOS 53  --   --   --   BILITOT 1.5*  --   --   --     Estimated Creatinine Clearance: 20.3 mL/min (A) (by C-G formula based on SCr of 1.16 mg/dL (H)).   Medical History: Past Medical History:  Diagnosis Date   CKD (chronic kidney disease) stage 4, GFR 15-29 ml/min (HCC)    Colon cancer (HCC)    Hypertension     Assessment: 87 y.o. female with medical history significant for HTN, CKD4, and colon cancer s/p colectomy with colostomy. Pharmacy has been consulted for hypertonic saline.   Goal of Therapy:  Na WNL Max rate of Na correction 8 mEq/L in 24 hrs  Plan:  --Na 124 >> 128 over 11 hours (0.36 mmol/L/hr) --Continue 3% NaCl infusion at rate of 30 ml/hr  --Continue checking Na q4h  Autumn Johnston  07/29/2021 10:41 AM

## 2021-07-29 NOTE — ED Notes (Signed)
Patient and patient's daughter updated on plan of care.

## 2021-07-29 NOTE — Progress Notes (Signed)
*  PRELIMINARY RESULTS* Echocardiogram 2D Echocardiogram has been performed.  Autumn Johnston 07/29/2021, 11:59 AM

## 2021-07-29 NOTE — Progress Notes (Signed)
Skin swarm completed with Magdalen Spatz. Blanchable redness to sacrum, foam dressing applied. Large soft area swollen to left of ostomy site. Unable to tell this RN if this is her norm. Denies pain at the site. Introduced to room and call light system. Fall mats in place. Heparin gtt restarted. Daughter at bedside.

## 2021-07-29 NOTE — ED Notes (Addendum)
Patient complaining of being cold, again. Provided patient multiple warm blankets. Patient then states, "I can't move." Uncovered blankets from patient. Patient then upset because she is cold. Patient then complained that she could not get out of bed. RN emphasized the importance of patient not getting out of bed. Patient then stated, "I'm not trying to get out of bed." Uncovered patient's feet per patient request. Patient then stated her feet were cold. Unable to accommodate patient in the way she wants to be accommodated at this time. Placed patient's shirt and jacket on her person to assist with feeling cold.  Warm blankets provided. Set patient up to eat breakfast.

## 2021-07-29 NOTE — ED Notes (Signed)
Lab called to obtain labs

## 2021-07-29 NOTE — ED Notes (Signed)
Assisted patient with repositioning in bed.   Daughter at bedside.  MD at bedside.

## 2021-07-29 NOTE — ED Notes (Signed)
Patient calling out for help.  RN called this RN and stated patient is trying to get out of bed.  Orientation to surroundings does not work with patient.   Charge RN aware of need for sitter.

## 2021-07-29 NOTE — ED Notes (Signed)
Responded to patient's call light. Assisted patient with food x 2 person assistance.

## 2021-07-30 DIAGNOSIS — I4729 Other ventricular tachycardia: Secondary | ICD-10-CM

## 2021-07-30 DIAGNOSIS — R778 Other specified abnormalities of plasma proteins: Secondary | ICD-10-CM | POA: Diagnosis not present

## 2021-07-30 DIAGNOSIS — I472 Ventricular tachycardia: Secondary | ICD-10-CM

## 2021-07-30 DIAGNOSIS — N184 Chronic kidney disease, stage 4 (severe): Secondary | ICD-10-CM

## 2021-07-30 DIAGNOSIS — E43 Unspecified severe protein-calorie malnutrition: Secondary | ICD-10-CM

## 2021-07-30 LAB — CBC
HCT: 28.6 % — ABNORMAL LOW (ref 36.0–46.0)
Hemoglobin: 9.9 g/dL — ABNORMAL LOW (ref 12.0–15.0)
MCH: 29.6 pg (ref 26.0–34.0)
MCHC: 34.6 g/dL (ref 30.0–36.0)
MCV: 85.6 fL (ref 80.0–100.0)
Platelets: 239 10*3/uL (ref 150–400)
RBC: 3.34 MIL/uL — ABNORMAL LOW (ref 3.87–5.11)
RDW: 13.2 % (ref 11.5–15.5)
WBC: 7 10*3/uL (ref 4.0–10.5)
nRBC: 0 % (ref 0.0–0.2)

## 2021-07-30 LAB — URINALYSIS, ROUTINE W REFLEX MICROSCOPIC
RBC / HPF: >50 RBC/hpf — ABNORMAL HIGH (ref 0–5)
Specific Gravity, Urine: 1.018 (ref 1.005–1.030)
Squamous Epithelial / HPF: NONE SEEN (ref 0–5)
WBC, UA: >50 WBC/hpf — ABNORMAL HIGH (ref 0–5)

## 2021-07-30 LAB — BASIC METABOLIC PANEL
Anion gap: 11 (ref 5–15)
BUN: 31 mg/dL — ABNORMAL HIGH (ref 8–23)
CO2: 20 mmol/L — ABNORMAL LOW (ref 22–32)
Calcium: 8.4 mg/dL — ABNORMAL LOW (ref 8.9–10.3)
Chloride: 100 mmol/L (ref 98–111)
Creatinine, Ser: 1.28 mg/dL — ABNORMAL HIGH (ref 0.44–1.00)
GFR, Estimated: 38 mL/min — ABNORMAL LOW (ref >60)
Glucose, Bld: 94 mg/dL (ref 70–99)
Potassium: 3.6 mmol/L (ref 3.5–5.1)
Sodium: 131 mmol/L — ABNORMAL LOW (ref 135–145)

## 2021-07-30 LAB — HEPARIN LEVEL (UNFRACTIONATED): Heparin Unfractionated: 0.37 IU/mL (ref 0.30–0.70)

## 2021-07-30 LAB — SODIUM: Sodium: 130 mmol/L — ABNORMAL LOW (ref 135–145)

## 2021-07-30 LAB — MAGNESIUM: Magnesium: 2.4 mg/dL (ref 1.7–2.4)

## 2021-07-30 MED ORDER — ATORVASTATIN CALCIUM 20 MG PO TABS
20.0000 mg | ORAL_TABLET | Freq: Every day | ORAL | Status: DC
  Administered 2021-07-30 – 2021-08-01 (×3): 20 mg via ORAL
  Filled 2021-07-30 (×3): qty 1

## 2021-07-30 MED ORDER — METOPROLOL TARTRATE 25 MG PO TABS
12.5000 mg | ORAL_TABLET | Freq: Once | ORAL | Status: AC
  Administered 2021-07-30: 12.5 mg via ORAL
  Filled 2021-07-30: qty 1

## 2021-07-30 MED ORDER — QUETIAPINE FUMARATE 25 MG PO TABS
25.0000 mg | ORAL_TABLET | Freq: Every day | ORAL | Status: DC
  Administered 2021-07-30 – 2021-07-31 (×2): 25 mg via ORAL
  Filled 2021-07-30 (×2): qty 1

## 2021-07-30 MED ORDER — METOPROLOL TARTRATE 25 MG PO TABS
25.0000 mg | ORAL_TABLET | Freq: Two times a day (BID) | ORAL | Status: DC
  Administered 2021-07-30 – 2021-08-01 (×3): 25 mg via ORAL
  Filled 2021-07-30 (×4): qty 1

## 2021-07-30 MED ORDER — COVID-19 MRNA VACC (MODERNA) 50 MCG/0.25ML IM SUSP
0.2500 mL | Freq: Once | INTRAMUSCULAR | Status: AC
  Administered 2021-07-31: 0.25 mL via INTRAMUSCULAR
  Filled 2021-07-30: qty 0.25

## 2021-07-30 NOTE — Evaluation (Signed)
Occupational Therapy Evaluation Patient Details Name: Autumn Johnston MRN: [id number] DOB: October 19, 1922 Today's Date: 07/30/2021    History of Present Illness Autumn Johnston is a 87 y.o. female with medical history significant for HTN, CKD4, colon cancer s/p colectomy with colostomy who lives at home with her husband and is independent and takes care of all her financial affairs herself who was brought into the ED with a concern for weakness and altered mental status.   Clinical Impression   Autumn Johnston was seen for OT evaluation this date. Prior to hospital admission, pt was MOD I for limited household mobility and ADLs. Pt lives with husband in home c level entry. Pt presents to acute OT demonstrating impaired ADL performance and functional mobility 2/2 decreased activity tolerance, functional strength/balance deficits, and poor insight into deficits. Pt currently requires MAX A don B socks at bed level. MOD A + RW sit<>stand x2. MOD cues for sequencing telephone use.  Pt would benefit from skilled OT to address noted impairments and functional limitations (see below for any additional details) in order to maximize safety and independence while minimizing falls risk and caregiver burden. Upon hospital discharge, recommend STR to maximize pt safety and return to PLOF.     Follow Up Recommendations  SNF    Equipment Recommendations  3 in 1 bedside commode    Recommendations for Other Services       Precautions / Restrictions Precautions Precautions: Fall Restrictions Weight Bearing Restrictions: No      Mobility Bed Mobility Overal bed mobility: Needs Assistance Bed Mobility: Supine to Sit;Sit to Supine     Supine to sit: Supervision Sit to supine: Supervision        Transfers Overall transfer level: Needs assistance Equipment used: Rolling walker (2 wheeled) Transfers: Sit to/from Stand Sit to Stand: Mod assist         General transfer comment: difficulty  sequencing    Balance Overall balance assessment: Needs assistance Sitting-balance support: No upper extremity supported;Feet supported Sitting balance-Leahy Scale: Good     Standing balance support: Bilateral upper extremity supported Standing balance-Leahy Scale: Poor                             ADL either performed or assessed with clinical judgement   ADL Overall ADL's : Needs assistance/impaired                                       General ADL Comments: MAX A don B socks at bed level. MOD A + RW for ADL t/f. MOD cues for sequencing telephone use      Pertinent Vitals/Pain Pain Assessment: No/denies pain     Hand Dominance     Extremity/Trunk Assessment Upper Extremity Assessment Upper Extremity Assessment: Generalized weakness   Lower Extremity Assessment Lower Extremity Assessment: Generalized weakness       Communication Communication Communication: HOH   Cognition Arousal/Alertness: Awake/alert Behavior During Therapy: Anxious Overall Cognitive Status: Impaired/Different from baseline Area of Impairment: Memory;Following commands;Problem solving                     Memory: Decreased recall of precautions;Decreased short-term memory Following Commands: Follows one step commands inconsistently;Follows one step commands with increased time     Problem Solving: Difficulty sequencing;Decreased initiation;Slow processing;Requires verbal cues;Requires tactile cues     General  Comments       Exercises Exercises: Other exercises Other Exercises Other Exercises: Pt and family educated re; OT role, DME recs, d/c recs, falls prevention Other Exercises: LBD, sup<>sit, sit<>stand x2, sitting/standing balance/tolerance   Shoulder Instructions      Home Living Family/patient expects to be discharged to:: Private residence Living Arrangements: Spouse/significant other Available Help at Discharge: Family;Available 24  hours/day (96yo husband) Type of Home: House Home Access: Level entry     Home Layout: Other (Comment) (3 steps to den c B rails)               Home Equipment: Kasandra Knudsen - single point          Prior Functioning/Environment Level of Independence: Independent with assistive device(s)  Gait / Transfers Assistance Needed: MOD I using SPC for household mobility, only leaves house for MD appointments ADL's / Homemaking Assistance Needed: Independent with ADLs including managing colostomy bag            OT Problem List: Decreased strength;Decreased range of motion;Decreased activity tolerance;Decreased safety awareness;Decreased cognition      OT Treatment/Interventions: Self-care/ADL training;Therapeutic exercise;Energy conservation;DME and/or AE instruction;Therapeutic activities;Patient/family education;Balance training    OT Goals(Current goals can be found in the care plan section) Acute Rehab OT Goals Patient Stated Goal: to go home after rehab OT Goal Formulation: With patient/family Time For Goal Achievement: 08/13/21 Potential to Achieve Goals: Good ADL Goals Pt Will Perform Grooming: sitting;with modified independence Pt Will Perform Lower Body Dressing: with set-up;with supervision;sitting/lateral leans Pt Will Transfer to Toilet: with min assist;stand pivot transfer;bedside commode (c LRAD PRN)  OT Frequency: Min 2X/week   Barriers to D/C: Decreased caregiver support          Co-evaluation              AM-PAC OT "6 Clicks" Daily Activity     Outcome Measure Help from another person eating meals?: None Help from another person taking care of personal grooming?: A Little Help from another person toileting, which includes using toliet, bedpan, or urinal?: A Lot Help from another person bathing (including washing, rinsing, drying)?: A Lot Help from another person to put on and taking off regular upper body clothing?: A Little Help from another person to put  on and taking off regular lower body clothing?: A Lot 6 Click Score: 16   End of Session Equipment Utilized During Treatment: Rolling walker;Gait belt  Activity Tolerance: Patient tolerated treatment well Patient left: in bed;with call bell/phone within reach;with family/visitor present  OT Visit Diagnosis: Other abnormalities of gait and mobility (R26.89)                Time: 8101-7510 OT Time Calculation (min): 22 min Charges:  OT General Charges $OT Visit: 1 Visit OT Evaluation $OT Eval Low Complexity: 1 Low OT Treatments $Self Care/Home Management : 8-22 mins  Dessie Coma, M.S. OTR/L  07/30/21, 4:38 PM  ascom [phone]

## 2021-07-30 NOTE — Progress Notes (Signed)
[facility]  ROUNDING NOTE   Subjective:   Autumn Johnston is a 87 y.o. female presenting to the ED with weakness, shortness of breath and nausea. She has been admitted for Dehydration [E86.0] Hyponatremia [E87.1] Generalized weakness [R53.1] AKI (acute kidney injury) ([place]) [N17.9] Sepsis ([place]) [A41.9]   Patient seen sitting up in bed Breakfast tray at bedside, partially eaten Denies nausea Denies shortness of breath States that she is confused and doesn't understand why she is in the hospital Patient seen later with daughter at bedside, concerned about mentation  Hep drip Foley  Objective:  Vital signs in last 24 hours:  Temp:  [97.2 F (36.2 C)-97.8 F (36.6 C)] 97.2 F (36.2 C) (08/10 0745) Pulse Rate:  [44-77] 58 (08/10 0745) Resp:  [13-20] 20 (08/10 0745) BP: (109-150)/(48-62) 111/57 (08/10 0745) SpO2:  [78 %-99 %] 99 % (08/10 0745)  Weight change:  Filed Weights   07/27/21 2315  Weight: 45.4 kg    Intake/Output: I/O last 3 completed shifts: In: 1001.4 [P.O.:750; I.V.:251.4] Out: 1950 [Urine:1950]   Intake/Output this shift:  Total I/O In: 120 [P.O.:120] Out: 450 [Urine:450]  Physical Exam: General: NAD, sitting up in bed  Head: Normocephalic, atraumatic. Moist oral mucosal membranes  Eyes: Anicteric  Lungs:  Clear to auscultation, normal effort  Heart: Regular rate and rhythm  Abdomen:  Soft, nontender  Extremities:  no peripheral edema.  Neurologic: Alert to person and place, moving all four extremities  Skin: No lesions  GU Foley-pink urine    Basic Metabolic Panel: Recent Labs  Lab 07/27/21 2136 07/28/21 0110 07/28/21 0630 07/28/21 0955 07/28/21 2034 07/29/21 0515 07/29/21 1558 07/29/21 2338 07/30/21 0614  NA 113*   < > 116*   < > 122* 124* 128* 130* 131*  K 4.4  --  3.9  --   --  3.8  --   --  3.6  CL 79*  --  83*  --   --  95*  --   --  100  CO2 21*  --  21*  --   --  21*  --   --  20*  GLUCOSE 119*  --  115*   --   --  76  --   --  94  BUN 26*  --  24*  --   --  22  --   --  31*  CREATININE 1.25*  --  1.01*  --   --  1.16*  --   --  1.28*  CALCIUM 9.2  --  8.5*  --   --  7.9*  --   --  8.4*  MG  --   --   --   --   --  1.6*  --   --  2.4   < > = values in this interval not displayed.     Liver Function Tests: Recent Labs  Lab 07/27/21 2136  AST 43*  ALT 23  ALKPHOS 53  BILITOT 1.5*  PROT 7.9  ALBUMIN 4.6    Recent Labs  Lab 07/27/21 2136  LIPASE 41    No results for input(s): AMMONIA in the last 168 hours.  CBC: Recent Labs  Lab 07/27/21 2136 07/29/21 0515 07/30/21 0614  WBC 8.0 6.5 7.0  NEUTROABS 6.9 4.5  --   HGB 13.8 10.3* 9.9*  HCT 37.5 29.2* 28.6*  MCV 83.1 86.4 85.6  PLT 323 238 239     Cardiac Enzymes: No results for input(s): CKTOTAL, CKMB,  CKMBINDEX, TROPONINI in the last 168 hours.  BNP: Invalid input(s): POCBNP  CBG: No results for input(s): GLUCAP in the last 168 hours.  Microbiology: Results for orders placed or performed during the hospital encounter of 07/27/21  Culture, blood (Routine x 2)     Status: None (Preliminary result)   Collection Time: 07/27/21  9:36 PM   Specimen: BLOOD  Result Value Ref Range Status   Specimen Description BLOOD BLOOD LEFT FOREARM  Final   Special Requests   Final    BOTTLES DRAWN AEROBIC AND ANAEROBIC Blood Culture results may not be optimal due to an inadequate volume of blood received in culture bottles   Culture   Final    NO GROWTH 3 DAYS Performed at [facility], [address], [place], [place] [address]    Report Status PENDING  Incomplete  Resp Panel by RT-PCR (Flu A&B, Covid) Nasopharyngeal Swab     Status: None   Collection Time: 07/27/21  9:36 PM   Specimen: Nasopharyngeal Swab; Nasopharyngeal(NP) swabs in vial transport medium  Result Value Ref Range Status   SARS Coronavirus 2 by RT PCR NEGATIVE NEGATIVE Final    Comment: (NOTE) SARS-CoV-2 target nucleic acids are NOT  DETECTED.  The SARS-CoV-2 RNA is generally detectable in upper respiratory specimens during the acute phase of infection. The lowest concentration of SARS-CoV-2 viral copies this assay can detect is 138 copies/mL. A negative result does not preclude SARS-Cov-2 infection and should not be used as the sole basis for treatment or other patient management decisions. A negative result may occur with  improper specimen collection/handling, submission of specimen other than nasopharyngeal swab, presence of viral mutation(s) within the areas targeted by this assay, and inadequate number of viral copies(<138 copies/mL). A negative result must be combined with clinical observations, patient history, and epidemiological information. The expected result is Negative.  Fact Sheet for Patients:  EntrepreneurPulse.com.au  Fact Sheet for Healthcare Providers:  IncredibleEmployment.be  This test is no t yet approved or cleared by the [place] FDA and  has been authorized for detection and/or diagnosis of SARS-CoV-2 by FDA under an Emergency Use Authorization (EUA). This EUA will remain  in effect (meaning this test can be used) for the duration of the COVID-19 declaration under Section 564(b)(1) of the Act, 21 U.S.C.section 360bbb-3(b)(1), unless the authorization is terminated  or revoked sooner.       Influenza A by PCR NEGATIVE NEGATIVE Final   Influenza B by PCR NEGATIVE NEGATIVE Final    Comment: (NOTE) The Xpert Xpress SARS-CoV-2/FLU/RSV plus assay is intended as an aid in the diagnosis of influenza from Nasopharyngeal swab specimens and should not be used as a sole basis for treatment. Nasal washings and aspirates are unacceptable for Xpert Xpress SARS-CoV-2/FLU/RSV testing.  Fact Sheet for Patients: EntrepreneurPulse.com.au  Fact Sheet for Healthcare Providers: IncredibleEmployment.be  This test is not yet  approved or cleared by the [place] FDA and has been authorized for detection and/or diagnosis of SARS-CoV-2 by FDA under an Emergency Use Authorization (EUA). This EUA will remain in effect (meaning this test can be used) for the duration of the COVID-19 declaration under Section 564(b)(1) of the Act, 21 U.S.C. section 360bbb-3(b)(1), unless the authorization is terminated or revoked.  Performed at [facility], [address] [place], [place] [address]   Culture, blood (Routine x 2)     Status: None (Preliminary result)   Collection Time: 07/28/21  1:10 AM   Specimen: BLOOD  Result  Value Ref Range Status   Specimen Description BLOOD BLOOD RIGHT FOREARM  Final   Special Requests   Final    BOTTLES DRAWN AEROBIC AND ANAEROBIC Blood Culture adequate volume   Culture   Final    NO GROWTH 2 DAYS Performed at [facility], [address], [place], [place] [address]    Report Status PENDING  Incomplete    Coagulation Studies: Recent Labs    07/27/21 March 15, 2135  LABPROT 12.5  INR 0.9     Urinalysis: Recent Labs    07/27/21 March 15, 2135  COLORURINE STRAW*  LABSPEC 1.009  PHURINE 6.0  GLUCOSEU NEGATIVE  HGBUR MODERATE*  BILIRUBINUR NEGATIVE  KETONESUR 5*  PROTEINUR 100*  NITRITE NEGATIVE  LEUKOCYTESUR NEGATIVE       Imaging: ECHOCARDIOGRAM COMPLETE  Result Date: 07/29/2021    ECHOCARDIOGRAM REPORT   Patient Name:   AREEJ TAYLER Italiano Date of Exam: 07/29/2021 Medical Rec #:  [id number]           Height:       63.0 in Accession #:    [id number]          Weight:       100.0 lb Date of Birth:  [birth year]           BSA:          1.440 m Patient Age:    47 years            BP:           122/64 mmHg Patient Gender: F                   HR:           76 bpm. Exam Location:  [facility] Procedure: 2D Echo, Color Doppler and Cardiac Doppler Indications:     NSTEMI I21.4  History:         Patient has no prior history of Echocardiogram examinations.                  Risk  Factors:Hypertension. CKD.  Sonographer:     Sherrie Sport RDCS (AE) Referring Phys:  [id number] Athena Masse Diagnosing Phys: Kate Sable MD  Sonographer Comments: Technically challenging study due to limited acoustic windows. Image acquisition challenging due to patient body habitus and Pt could not lie on left side nor supine. IMPRESSIONS  1. Left ventricular ejection fraction, by estimation, is 60 to 65%. The left ventricle has normal function. The left ventricle has no regional wall motion abnormalities. There is mild left ventricular hypertrophy. Left ventricular diastolic parameters are consistent with Grade II diastolic dysfunction (pseudonormalization).  2. Right ventricular systolic function is normal. The right ventricular size is normal.  3. Left atrial size was moderately dilated.  4. The mitral valve is degenerative. Mild mitral valve regurgitation.  5. The aortic valve is tricuspid. Aortic valve regurgitation is not visualized. Mild to moderate aortic valve sclerosis/calcification is present, without any evidence of aortic stenosis. FINDINGS  Left Ventricle: Left ventricular ejection fraction, by estimation, is 60 to 65%. The left ventricle has normal function. The left ventricle has no regional wall motion abnormalities. The left ventricular internal cavity size was normal in size. There is  mild left ventricular hypertrophy. Left ventricular diastolic parameters are consistent with Grade II diastolic dysfunction (pseudonormalization). Right Ventricle: The right ventricular size is normal. No increase in right ventricular wall thickness. Right ventricular systolic function is normal. Left Atrium: Left atrial size was moderately dilated. Right  Atrium: Right atrial size was normal in size. Pericardium: There is no evidence of pericardial effusion. Mitral Valve: The mitral valve is degenerative in appearance. There is moderate calcification of the mitral valve leaflet(s). Mild mitral valve  regurgitation. Tricuspid Valve: The tricuspid valve is not well visualized. Tricuspid valve regurgitation is mild. Aortic Valve: The aortic valve is tricuspid. Aortic valve regurgitation is not visualized. Mild to moderate aortic valve sclerosis/calcification is present, without any evidence of aortic stenosis. Aortic valve mean gradient measures 4.0 mmHg. Aortic valve peak gradient measures 8.5 mmHg. Aortic valve area, by VTI measures 2.16 cm. Pulmonic Valve: The pulmonic valve was not well visualized. Pulmonic valve regurgitation is not visualized. Aorta: The aortic root is normal in size and structure. Venous: The inferior vena cava was not well visualized. IAS/Shunts: There is right bowing of the interatrial septum, suggestive of elevated left atrial pressure. No atrial level shunt detected by color flow Doppler.  LEFT VENTRICLE PLAX 2D LVIDd:         2.92 cm  Diastology LVIDs:         1.84 cm  LV e' medial:    2.61 cm/s LV PW:         1.34 cm  LV E/e' medial:  17.4 LV IVS:        1.50 cm  LV e' lateral:   5.33 cm/s LVOT diam:     2.00 cm  LV E/e' lateral: 8.5 LV SV:         55 LV SV Index:   38 LVOT Area:     3.14 cm  LEFT ATRIUM             Index       RIGHT ATRIUM           Index LA diam:        3.90 cm 2.71 cm/m  RA Area:     10.50 cm LA Vol (A2C):   39.4 ml 27.36 ml/m RA Volume:   19.70 ml  13.68 ml/m LA Vol (A4C):   52.2 ml 36.24 ml/m LA Biplane Vol: 47.1 ml 32.70 ml/m  AORTIC VALVE                   PULMONIC VALVE AV Area (Vmax):    1.98 cm    PV Vmax:        0.51 m/s AV Area (Vmean):   2.19 cm    PV Peak grad:   1.0 mmHg AV Area (VTI):     2.16 cm    RVOT Peak grad: 1 mmHg AV Vmax:           146.00 cm/s AV Vmean:          94.400 cm/s AV VTI:            0.253 m AV Peak Grad:      8.5 mmHg AV Mean Grad:      4.0 mmHg LVOT Vmax:         92.00 cm/s LVOT Vmean:        65.800 cm/s LVOT VTI:          0.174 m LVOT/AV VTI ratio: 0.69  AORTA Ao Root diam: 3.20 cm MITRAL VALVE MV Area (PHT): 4.41 cm     SHUNTS MV Decel Time: 172 msec    Systemic VTI:  0.17 m MV E velocity: 45.40 cm/s  Systemic Diam: 2.00 cm MV A velocity: 39.80 cm/s MV E/A ratio:  1.14 Kate Sable MD  Electronically signed by Kate Sable MD Signature Date/Time: 07/29/2021/3:00:57 PM    Final      Medications:    heparin 550 Units/hr (07/29/21 0305)    amLODipine  5 mg Oral Daily   aspirin EC  81 mg Oral Daily   atorvastatin  20 mg Oral Daily   carvedilol  6.25 mg Oral BID WC   Chlorhexidine Gluconate Cloth  6 each Topical Daily   feeding supplement  237 mL Oral BID BM   multivitamin with minerals  1 tablet Oral Daily   acetaminophen **OR** acetaminophen, haloperidol lactate, ondansetron **OR** ondansetron (ZOFRAN) IV  Assessment/ Plan:  Ms. Tiannah Greenly is a 87 y.o.  female presenting to the ED with weakness, shortness of breath and nausea. She has been admitted for Dehydration [E86.0] Hyponatremia [E87.1] Generalized weakness [R53.1] AKI (acute kidney injury) ([place]) [N17.9] Sepsis ([place]) [A41.9]   #Acute hyponatremia Likely secondary to "tea and toast diet".  Patient's appetite has been poor as outpatient but she has been drinking increased amounts of plain water.  This may have contributed to Developing hyponatremia over time.  Review of outpatient records show that her sodium level was normal in May 2022.  Mildly decreased sodium of 132 noted in July, further decrease noted on July 27 with sodium level of 127.  TSH normal.  CT with IV contrast without any evidence of lung mass.   3% (hypertonic) saline discontinued yesterday. Current sodium 131. Patient alert to place and person, fixated on insurance, requesting discharge.This believed to be more delirium, than hyponatremic confusion Continue to monitor neuro status Continues poor appetite, Ensure ordered BID    #Acute kidney injury Outpatient creatinine has been higher Unclear cause but ? Related to losartan. Currently on hold.  Creatinine level  1.28 Will follow  #Hematuria Patient may have pulled it while confused  Will order UA with culture    LOS: 3   8/10/202211:10 AM

## 2021-07-30 NOTE — Evaluation (Signed)
Physical Therapy Evaluation Patient Details Name: Autumn Johnston MRN: [id number] DOB: January 01, 1922 Today's Date: 07/30/2021   History of Present Illness  Pt is a 87 y.o. female presenting to hospital 8/7 with generalized weakness and AMS; decreased oral intake.  Pt admitted with acute hyponatremia, acute metabolic encephalopathy, elevated troponin (likely supply demand ischemia), and hypertensive urgency.  PMH includes htn, HLD, anemia, acute on chronic renal failure, colon CA s/p colectomy with colostomy, and chronic insomnia.  Clinical Impression  Prior to hospital admission, pt was ambulatory (with RW vs SPC) and lives with her husband in split level home.  Pt oriented to person, place, time, but not situation.  Pt often talking about her insurance/benefits during session.  Currently pt is mod assist semi-supine to sitting edge of bed; mod assist with transfers; and mod assist to ambulate a few feet with RW bed to recliner.  Pt had a lot of difficulty sequencing and following simple 1 step cueing during sessions activities.  Generalized weakness noted and pt fatigued quickly during session.  Pt would benefit from skilled PT to address noted impairments and functional limitations (see below for any additional details).  Upon hospital discharge, pt would benefit from SNF.     Follow Up Recommendations SNF    Equipment Recommendations  Rolling walker with 5" wheels;3in1 (PT)    Recommendations for Other Services OT consult     Precautions / Restrictions Precautions Precautions: Fall Restrictions Weight Bearing Restrictions: No      Mobility  Bed Mobility Overal bed mobility: Needs Assistance Bed Mobility: Supine to Sit     Supine to sit: Mod assist   General bed mobility comments: pt able to long sit in bed but required assist to scoot hips towards edge of bed    Transfers Overall transfer level: Needs assistance Equipment used: Rolling walker (2 wheeled) Transfers: Sit  to/from Stand Sit to Stand: Mod assist         General transfer comment: vc's and tactile cues for UE/LE placement; assist to initiate and come to full stand and control descent sitting  Ambulation/Gait Ambulation/Gait assistance: Mod assist Gait Distance (Feet): 3 Feet (bed to recliner) Assistive device: Rolling walker (2 wheeled)   Gait velocity: decreased   General Gait Details: pt with difficulty sequencing and following simple 1 step commands to take steps bed to recliner; vc's, tactile cues, and visual cueing required; assist for balance  Stairs            Wheelchair Mobility    Modified Rankin (Stroke Patients Only)       Balance Overall balance assessment: Needs assistance Sitting-balance support: No upper extremity supported;Feet supported Sitting balance-Leahy Scale: Good Sitting balance - Comments: steady sitting reaching within BOS   Standing balance support: Bilateral upper extremity supported Standing balance-Leahy Scale: Poor Standing balance comment: pt with posterior lean in standing requiring assist for upright balance                             Pertinent Vitals/Pain Pain Assessment: No/denies pain Vitals (HR and O2 on room air) stable and WFL throughout treatment session.    Home Living Family/patient expects to be discharged to:: Private residence Living Arrangements: Spouse/significant other Available Help at Discharge: Family;Available 24 hours/day (96 y.o. husband) Type of Home: House Home Access: Level entry     Home Layout: Other (Comment) (3-4 steps from living room to kitchen/bedrooms with B railings) Home Equipment: Cane - single  point;Walker - 2 wheels      Prior Function Level of Independence: Independent with assistive device(s)   Gait / Transfers Assistance Needed: Pt ambulating with SPC or RW.  No recent falls reported by pt or pt's daughter.  ADL's / Homemaking Assistance Needed: Independent with ADL's  (including colostomy bag management)        Hand Dominance        Extremity/Trunk Assessment   Upper Extremity Assessment Upper Extremity Assessment: Generalized weakness    Lower Extremity Assessment Lower Extremity Assessment: Generalized weakness       Communication   Communication: HOH  Cognition Arousal/Alertness: Awake/alert Behavior During Therapy: Anxious Overall Cognitive Status: Impaired/Different from baseline Area of Impairment: Memory;Following commands;Problem solving                     Memory: Decreased recall of precautions;Decreased short-term memory Following Commands: Follows one step commands inconsistently;Follows one step commands with increased time     Problem Solving: Difficulty sequencing;Decreased initiation;Slow processing;Requires verbal cues;Requires tactile cues General Comments: Oriented to person, place, and time but not situation.      General Comments  Nursing cleared pt for participation in physical therapy.  Pt agreeable to PT session.  Pt's daughter present during session.    Exercises Transfer and gait training   Assessment/Plan    PT Assessment Patient needs continued PT services  PT Problem List Decreased strength;Decreased activity tolerance;Decreased balance;Decreased mobility;Decreased cognition;Decreased knowledge of use of DME;Decreased safety awareness;Decreased knowledge of precautions       PT Treatment Interventions DME instruction;Gait training;Stair training;Functional mobility training;Therapeutic activities;Therapeutic exercise;Balance training;Cognitive remediation;Patient/family education    PT Goals (Current goals can be found in the Care Plan section)  Acute Rehab PT Goals Patient Stated Goal: to improve walking PT Goal Formulation: With patient/family Time For Goal Achievement: 08/13/21 Potential to Achieve Goals: Good    Frequency Min 2X/week   Barriers to discharge Decreased caregiver  support      Co-evaluation               AM-PAC PT "6 Clicks" Mobility  Outcome Measure Help needed turning from your back to your side while in a flat bed without using bedrails?: None Help needed moving from lying on your back to sitting on the side of a flat bed without using bedrails?: A Lot Help needed moving to and from a bed to a chair (including a wheelchair)?: A Lot Help needed standing up from a chair using your arms (e.g., wheelchair or bedside chair)?: A Lot Help needed to walk in hospital room?: A Lot Help needed climbing 3-5 steps with a railing? : Total 6 Click Score: 13    End of Session Equipment Utilized During Treatment: Gait belt (belt up high away from colostomy site) Activity Tolerance: Patient limited by fatigue Patient left: in chair;with call bell/phone within reach;with chair alarm set;with family/visitor present Nurse Communication: Mobility status;Precautions PT Visit Diagnosis: Unsteadiness on feet (R26.81);Other abnormalities of gait and mobility (R26.89);Muscle weakness (generalized) (M62.81)    Time: 7253-6644 PT Time Calculation (min) (ACUTE ONLY): 34 min   Charges:   PT Evaluation $PT Eval Low Complexity: 1 Low PT Treatments $Therapeutic Activity: 23-37 mins       Leitha Bleak, PT 07/30/21, 5:38 PM

## 2021-07-30 NOTE — Progress Notes (Addendum)
PROGRESS NOTE    Autumn Johnston  BJS:[id number] DOB: Apr 20, 1922 DOA: 07/27/2021 PCP: Idelle Crouch, MD   Chief Complaint.  Altered mental status. Brief Narrative:  Autumn Johnston is a 87 y.o. female with medical history significant for HTN, CKD4, colon cancer s/p colectomy with colostomy who lives at home with her husband and is independent and takes care of all her financial affairs herself who was brought into the ED with a concern for weakness and altered mental status.  Patient had hypocalcemia with a potassium of 6.4 in May, since that time, he was advised to drink excessive amount of fluids.  She has been drinking large amount of water on a daily basis.  Upon arriving the emergency room, she was found to have altered mental status with potassium 113.  He was given 3% sodium chloride.  Consult from nephrology is also obtained.   Assessment & Plan:   Principal Problem:   Acute hyponatremia Active Problems:   Chronic kidney disease (CKD), stage IV (severe) (HCC)   Elevated troponin   Acute metabolic encephalopathy   Hypertensive urgency   Protein calorie malnutrition (HCC)   Hyponatremia  #1. severe hyponatremia Acute metabolic encephalopathy secondary to hyponatremia. Sodium level had improved, currently sodium 131.  3% sodium chloride was discontinued yesterday. Patient has significant weakness today, will obtain PT/OT. Patient may also has some dementia as patient still has significant confusion. Her initial UA was -3 days ago, I will recheck a UA to make sure mental status changes not caused by UTI. Another consideration is that patient has poor sleep at nighttime.  I will start low-dose Seroquel.  #2.  Elevated troponin secondary to demand ischemia.  (Final diagnosis). Non-STEMI ruled out. Followed by cardiology, probably will discontinue heparin drip today. Continue aspirin  #3.  Chronic kidney disease stage IV. Anemia of chronic disease. Renal function  slightly worse today, recheck a BMP tomorrow. No significant iron, B12 deficiency for anemia.  4.  Hypertension emergency. Continue current treatment.  5.  Severe protein calorie malnutrition. Continue supplement  #6.  Nonsustained ventricular tachycardia. Patient had 12 beats of V. tach today,  asymptomatic. Echocardiogram showed ejection fraction 60 to 65%.  We continue beta-blocker.  #6 CODE STATUS. Had a long discussion with patient daughter, decision was made to place patient on DO NOT RESUSCITATE status.    DVT prophylaxis: Heparin Code Status: DNR Family Communication: Updated daughter Disposition Plan:    Status is: Inpatient  Remains inpatient appropriate because:Inpatient level of care appropriate due to severity of illness  Dispo: The patient is from: Home              Anticipated d/c is to: SNF              Patient currently is not medically stable to d/c.   Difficult to place patient No        I/O last 3 completed shifts: In: 1001.4 [P.O.:750; I.V.:251.4] Out: 1950 [Urine:1950] Total I/O In: -  Out: 35 [Urine:450]     Consultants:  Nephrology  Procedures: None  Antimicrobials: None Subjective: Patient still has significant confusion, she is very worried about stay in the hospital and her insurance will not cover it. She denies any short of breath or cough. She has decent appetite without nausea vomiting. No dysuria hematuria pain No fever or chills.  Objective: Vitals:   07/29/21 2101 07/30/21 0010 07/30/21 0441 07/30/21 0745  BP: (!) 150/57 (!) 137/55 130/62 (!) 111/57  Pulse: 62  60 (!) 58  Resp: 18 19 16 20   Temp: (!) 97.5 F (36.4 C) 97.7 F (36.5 C) (!) 97.3 F (36.3 C) (!) 97.2 F (36.2 C)  TempSrc: Oral Oral Oral Oral  SpO2: 99% 98% 98% 99%  Weight:      Height:        Intake/Output Summary (Last 24 hours) at 07/30/2021 1016 Last data filed at 07/30/2021 1004 Gross per 24 hour  Intake 1001.36 ml  Output 450 ml  Net  551.36 ml   Filed Weights   07/27/21 2315  Weight: 45.4 kg    Examination:  General exam: Appears calm and comfortable, frail and severely malnourished. Respiratory system: Clear to auscultation. Respiratory effort normal. Cardiovascular system: S1 & S2 heard, RRR. No JVD, murmurs, rubs, gallops or clicks. No pedal edema. Gastrointestinal system: Abdomen is nondistended, soft and nontender. No organomegaly or masses felt. Normal bowel sounds heard. Central nervous system: Alert and oriented x2. No focal neurological deficits. Extremities: Symmetric 5 x 5 power. Skin: No rashes, lesions or ulcers Psychiatry: Judgement and insight appear normal. Mood & affect appropriate.     Data Reviewed: I have personally reviewed following labs and imaging studies  CBC: Recent Labs  Lab 07/27/21 2136 07/29/21 0515 07/30/21 0614  WBC 8.0 6.5 7.0  NEUTROABS 6.9 4.5  --   HGB 13.8 10.3* 9.9*  HCT 37.5 29.2* 28.6*  MCV 83.1 86.4 85.6  PLT 323 238 742   Basic Metabolic Panel: Recent Labs  Lab 07/27/21 2136 07/28/21 0110 07/28/21 0653 07/28/21 0955 07/28/21 2034 07/29/21 0515 07/29/21 1558 07/29/21 2338 07/30/21 0614  NA 113*   < > 116*   < > 122* 124* 128* 130* 131*  K 4.4  --  3.9  --   --  3.8  --   --  3.6  CL 79*  --  83*  --   --  95*  --   --  100  CO2 21*  --  21*  --   --  21*  --   --  20*  GLUCOSE 119*  --  115*  --   --  76  --   --  94  BUN 26*  --  24*  --   --  22  --   --  31*  CREATININE 1.25*  --  1.01*  --   --  1.16*  --   --  1.28*  CALCIUM 9.2  --  8.5*  --   --  7.9*  --   --  8.4*  MG  --   --   --   --   --  1.6*  --   --  2.4   < > = values in this interval not displayed.   GFR: Estimated Creatinine Clearance: 18.4 mL/min (A) (by C-G formula based on SCr of 1.28 mg/dL (H)). Liver Function Tests: Recent Labs  Lab 07/27/21 2136  AST 43*  ALT 23  ALKPHOS 53  BILITOT 1.5*  PROT 7.9  ALBUMIN 4.6   Recent Labs  Lab 07/27/21 2136  LIPASE 41    No results for input(s): AMMONIA in the last 168 hours. Coagulation Profile: Recent Labs  Lab 07/27/21 2136  INR 0.9   Cardiac Enzymes: No results for input(s): CKTOTAL, CKMB, CKMBINDEX, TROPONINI in the last 168 hours. BNP (last 3 results) No results for input(s): PROBNP in the last 8760 hours. HbA1C: Recent Labs    07/28/21 1215  HGBA1C 5.6  CBG: No results for input(s): GLUCAP in the last 168 hours. Lipid Profile: Recent Labs    07/28/21 1215  CHOL 270*  HDL 122  LDLCALC 141*  TRIG 37  CHOLHDL 2.2   Thyroid Function Tests: No results for input(s): TSH, T4TOTAL, FREET4, T3FREE, THYROIDAB in the last 72 hours. Anemia Panel: Recent Labs    07/29/21 1321  VITAMINB12 257  FOLATE 18.9  TIBC 262  IRON 147   Sepsis Labs: Recent Labs  Lab 07/27/21 2136 07/28/21 0110  LATICACIDVEN 1.8 2.5*    Recent Results (from the past 240 hour(s))  Culture, blood (Routine x 2)     Status: None (Preliminary result)   Collection Time: 07/27/21  9:36 PM   Specimen: BLOOD  Result Value Ref Range Status   Specimen Description BLOOD BLOOD LEFT FOREARM  Final   Special Requests   Final    BOTTLES DRAWN AEROBIC AND ANAEROBIC Blood Culture results may not be optimal due to an inadequate volume of blood received in culture bottles   Culture   Final    NO GROWTH 3 DAYS Performed at [facility], [address] [place], [place] [address]    Report Status PENDING  Incomplete  Resp Panel by RT-PCR (Flu A&B, Covid) Nasopharyngeal Swab     Status: None   Collection Time: 07/27/21  9:36 PM   Specimen: Nasopharyngeal Swab; Nasopharyngeal(NP) swabs in vial transport medium  Result Value Ref Range Status   SARS Coronavirus 2 by RT PCR NEGATIVE NEGATIVE Final    Comment: (NOTE) SARS-CoV-2 target nucleic acids are NOT DETECTED.  The SARS-CoV-2 RNA is generally detectable in upper respiratory specimens during the acute phase of infection. The lowest concentration of  SARS-CoV-2 viral copies this assay can detect is 138 copies/mL. A negative result does not preclude SARS-Cov-2 infection and should not be used as the sole basis for treatment or other patient management decisions. A negative result may occur with  improper specimen collection/handling, submission of specimen other than nasopharyngeal swab, presence of viral mutation(s) within the areas targeted by this assay, and inadequate number of viral copies(<138 copies/mL). A negative result must be combined with clinical observations, patient history, and epidemiological information. The expected result is Negative.  Fact Sheet for Patients:  EntrepreneurPulse.com.au  Fact Sheet for Healthcare Providers:  IncredibleEmployment.be  This test is no t yet approved or cleared by the [place] FDA and  has been authorized for detection and/or diagnosis of SARS-CoV-2 by FDA under an Emergency Use Authorization (EUA). This EUA will remain  in effect (meaning this test can be used) for the duration of the COVID-19 declaration under Section 564(b)(1) of the Act, 21 U.S.C.section 360bbb-3(b)(1), unless the authorization is terminated  or revoked sooner.       Influenza A by PCR NEGATIVE NEGATIVE Final   Influenza B by PCR NEGATIVE NEGATIVE Final    Comment: (NOTE) The Xpert Xpress SARS-CoV-2/FLU/RSV plus assay is intended as an aid in the diagnosis of influenza from Nasopharyngeal swab specimens and should not be used as a sole basis for treatment. Nasal washings and aspirates are unacceptable for Xpert Xpress SARS-CoV-2/FLU/RSV testing.  Fact Sheet for Patients: EntrepreneurPulse.com.au  Fact Sheet for Healthcare Providers: IncredibleEmployment.be  This test is not yet approved or cleared by the [place] FDA and has been authorized for detection and/or diagnosis of SARS-CoV-2 by FDA under an Emergency Use  Authorization (EUA). This EUA will remain in effect (meaning this test can be used) for the duration  of the COVID-19 declaration under Section 564(b)(1) of the Act, 21 U.S.C. section 360bbb-3(b)(1), unless the authorization is terminated or revoked.  Performed at [facility], [address] [place], [place] [address]   Culture, blood (Routine x 2)     Status: None (Preliminary result)   Collection Time: 07/28/21  1:10 AM   Specimen: BLOOD  Result Value Ref Range Status   Specimen Description BLOOD BLOOD RIGHT FOREARM  Final   Special Requests   Final    BOTTLES DRAWN AEROBIC AND ANAEROBIC Blood Culture adequate volume   Culture   Final    NO GROWTH 2 DAYS Performed at [facility], [address] [place], [place] [address]    Report Status PENDING  Incomplete         Radiology Studies: ECHOCARDIOGRAM COMPLETE  Result Date: 07/29/2021    ECHOCARDIOGRAM REPORT   Patient Name:   KHYLER URDA Date of Exam: 07/29/2021 Medical Rec #:  [id number]           Height:       63.0 in Accession #:    [id number]          Weight:       100.0 lb Date of Birth:  1922/04/29           BSA:          1.440 m Patient Age:    89 years            BP:           122/64 mmHg Patient Gender: F                   HR:           76 bpm. Exam Location:  [facility] Procedure: 2D Echo, Color Doppler and Cardiac Doppler Indications:     NSTEMI I21.4  History:         Patient has no prior history of Echocardiogram examinations.                  Risk Factors:Hypertension. CKD.  Sonographer:     Sherrie Sport RDCS (AE) Referring Phys:  [id number] Athena Masse Diagnosing Phys: Kate Sable MD  Sonographer Comments: Technically challenging study due to limited acoustic windows. Image acquisition challenging due to patient body habitus and Pt could not lie on left side nor supine. IMPRESSIONS  1. Left ventricular ejection fraction, by estimation, is 60 to 65%. The left ventricle has normal function. The left  ventricle has no regional wall motion abnormalities. There is mild left ventricular hypertrophy. Left ventricular diastolic parameters are consistent with Grade II diastolic dysfunction (pseudonormalization).  2. Right ventricular systolic function is normal. The right ventricular size is normal.  3. Left atrial size was moderately dilated.  4. The mitral valve is degenerative. Mild mitral valve regurgitation.  5. The aortic valve is tricuspid. Aortic valve regurgitation is not visualized. Mild to moderate aortic valve sclerosis/calcification is present, without any evidence of aortic stenosis. FINDINGS  Left Ventricle: Left ventricular ejection fraction, by estimation, is 60 to 65%. The left ventricle has normal function. The left ventricle has no regional wall motion abnormalities. The left ventricular internal cavity size was normal in size. There is  mild left ventricular hypertrophy. Left ventricular diastolic parameters are consistent with Grade II diastolic dysfunction (pseudonormalization). Right Ventricle: The right ventricular size is normal. No increase in right ventricular wall thickness. Right ventricular systolic function is normal. Left Atrium: Left  atrial size was moderately dilated. Right Atrium: Right atrial size was normal in size. Pericardium: There is no evidence of pericardial effusion. Mitral Valve: The mitral valve is degenerative in appearance. There is moderate calcification of the mitral valve leaflet(s). Mild mitral valve regurgitation. Tricuspid Valve: The tricuspid valve is not well visualized. Tricuspid valve regurgitation is mild. Aortic Valve: The aortic valve is tricuspid. Aortic valve regurgitation is not visualized. Mild to moderate aortic valve sclerosis/calcification is present, without any evidence of aortic stenosis. Aortic valve mean gradient measures 4.0 mmHg. Aortic valve peak gradient measures 8.5 mmHg. Aortic valve area, by VTI measures 2.16 cm. Pulmonic Valve: The  pulmonic valve was not well visualized. Pulmonic valve regurgitation is not visualized. Aorta: The aortic root is normal in size and structure. Venous: The inferior vena cava was not well visualized. IAS/Shunts: There is right bowing of the interatrial septum, suggestive of elevated left atrial pressure. No atrial level shunt detected by color flow Doppler.  LEFT VENTRICLE PLAX 2D LVIDd:         2.92 cm  Diastology LVIDs:         1.84 cm  LV e' medial:    2.61 cm/s LV PW:         1.34 cm  LV E/e' medial:  17.4 LV IVS:        1.50 cm  LV e' lateral:   5.33 cm/s LVOT diam:     2.00 cm  LV E/e' lateral: 8.5 LV SV:         55 LV SV Index:   38 LVOT Area:     3.14 cm  LEFT ATRIUM             Index       RIGHT ATRIUM           Index LA diam:        3.90 cm 2.71 cm/m  RA Area:     10.50 cm LA Vol (A2C):   39.4 ml 27.36 ml/m RA Volume:   19.70 ml  13.68 ml/m LA Vol (A4C):   52.2 ml 36.24 ml/m LA Biplane Vol: 47.1 ml 32.70 ml/m  AORTIC VALVE                   PULMONIC VALVE AV Area (Vmax):    1.98 cm    PV Vmax:        0.51 m/s AV Area (Vmean):   2.19 cm    PV Peak grad:   1.0 mmHg AV Area (VTI):     2.16 cm    RVOT Peak grad: 1 mmHg AV Vmax:           146.00 cm/s AV Vmean:          94.400 cm/s AV VTI:            0.253 m AV Peak Grad:      8.5 mmHg AV Mean Grad:      4.0 mmHg LVOT Vmax:         92.00 cm/s LVOT Vmean:        65.800 cm/s LVOT VTI:          0.174 m LVOT/AV VTI ratio: 0.69  AORTA Ao Root diam: 3.20 cm MITRAL VALVE MV Area (PHT): 4.41 cm    SHUNTS MV Decel Time: 172 msec    Systemic VTI:  0.17 m MV E velocity: 45.40 cm/s  Systemic Diam: 2.00 cm MV A velocity: 39.80 cm/s MV E/A ratio:  1.14 Kate Sable MD Electronically signed by Kate Sable MD Signature Date/Time: 07/29/2021/3:00:57 PM    Final         Scheduled Meds:  amLODipine  5 mg Oral Daily   aspirin EC  81 mg Oral Daily   atorvastatin  20 mg Oral Daily   carvedilol  6.25 mg Oral BID WC   Chlorhexidine Gluconate Cloth  6 each  Topical Daily   feeding supplement  237 mL Oral BID BM   multivitamin with minerals  1 tablet Oral Daily   Continuous Infusions:  heparin 550 Units/hr (07/29/21 0305)     LOS: 3 days    Time spent: 32 minutes, more than 50% time involved in direct patient care    Sharen Hones, MD [facility]   To contact the attending provider between 7A-7P or the covering provider during after hours 7P-7A, please log into the web site [email or web address] and access using universal [place] password for that web site. If you do not have the password, please call the hospital operator.  07/30/2021, 10:16 AM

## 2021-07-30 NOTE — Progress Notes (Signed)
Progress Note  Patient Name: Autumn Johnston Date of Encounter: 07/30/2021  [facility] Cardiologist: new Dr. Fletcher Anon  Subjective   No acute events overnight, daughter at bedside.  Patient still appears confused.  Trying to eat  Inpatient Medications    Scheduled Meds:  aspirin EC  81 mg Oral Daily   atorvastatin  20 mg Oral Daily   Chlorhexidine Gluconate Cloth  6 each Topical Daily   [START ON 07/31/2021] COVID-19 mRNA vaccine (Moderna)  0.25 mL Intramuscular ONCE-1600   feeding supplement  237 mL Oral BID BM   metoprolol tartrate  25 mg Oral BID   multivitamin with minerals  1 tablet Oral Daily   QUEtiapine  25 mg Oral QHS   Continuous Infusions:  PRN Meds: acetaminophen **OR** acetaminophen, haloperidol lactate, ondansetron **OR** ondansetron (ZOFRAN) IV   Vital Signs    Vitals:   07/30/21 0745 07/30/21 1200 07/30/21 1641 07/30/21 1641  BP: (!) 111/57 (!) 111/50 (!) 95/53 (!) 95/53  Pulse: (!) 58 66 65 61  Resp: 20 16  18   Temp: (!) 97.2 F (36.2 C) 98.2 F (36.8 C)  98 F (36.7 C)  TempSrc: Oral Oral  Oral  SpO2: 99% 97%  94%  Weight:      Height:        Intake/Output Summary (Last 24 hours) at 07/30/2021 1800 Last data filed at 07/30/2021 1700 Gross per 24 hour  Intake 371.36 ml  Output 1100 ml  Net -728.64 ml   Last 3 Weights 07/27/2021  Weight (lbs) 100 lb  Weight (kg) 45.36 kg      Telemetry    Sinus rhythm, 12 beat run of VT- Personally Reviewed  ECG    No new tracing- Personally Reviewed  Physical Exam   GEN: No acute distress.   Neck: No JVD Cardiac: RRR, no murmurs, rubs, or gallops.  Respiratory: Clear to auscultation bilaterally. GI: Soft, nontender, non-distended  MS: No edema; No deformity. Neuro:  Nonfocal  Psych: Normal affect   Labs    High Sensitivity Troponin:   Recent Labs  Lab 07/27/21 2136 07/27/21 2234 07/28/21 0110 07/28/21 0428  TROPONINIHS 128* 187* 476* 406*      Chemistry Recent Labs  Lab  07/27/21 2136 07/28/21 0110 07/28/21 5621 07/28/21 0955 07/29/21 0515 07/29/21 1558 07/29/21 2338 07/30/21 0614  NA 113*   < > 116*   < > 124* 128* 130* 131*  K 4.4  --  3.9  --  3.8  --   --  3.6  CL 79*  --  83*  --  95*  --   --  100  CO2 21*  --  21*  --  21*  --   --  20*  GLUCOSE 119*  --  115*  --  76  --   --  94  BUN 26*  --  24*  --  22  --   --  31*  CREATININE 1.25*  --  1.01*  --  1.16*  --   --  1.28*  CALCIUM 9.2  --  8.5*  --  7.9*  --   --  8.4*  PROT 7.9  --   --   --   --   --   --   --   ALBUMIN 4.6  --   --   --   --   --   --   --   AST 43*  --   --   --   --   --   --   --  ALT 23  --   --   --   --   --   --   --   ALKPHOS 53  --   --   --   --   --   --   --   BILITOT 1.5*  --   --   --   --   --   --   --   GFRNONAA 39*  --  51*  --  43*  --   --  38*  ANIONGAP 13  --  12  --  8  --   --  11   < > = values in this interval not displayed.     Hematology Recent Labs  Lab 07/27/21 2136 07/29/21 0515 07/30/21 0614  WBC 8.0 6.5 7.0  RBC 4.51 3.38* 3.34*  HGB 13.8 10.3* 9.9*  HCT 37.5 29.2* 28.6*  MCV 83.1 86.4 85.6  MCH 30.6 30.5 29.6  MCHC 36.8* 35.3 34.6  RDW 12.5 12.7 13.2  PLT 323 238 239    BNP Recent Labs  Lab 07/27/21 2136  BNP 260.0*     DDimer No results for input(s): DDIMER in the last 168 hours.   Radiology    ECHOCARDIOGRAM COMPLETE  Result Date: 07/29/2021    ECHOCARDIOGRAM REPORT   Patient Name:   Autumn Johnston Date of Exam: 07/29/2021 Medical Rec #:  [id number]           Height:       63.0 in Accession #:    [id number]          Weight:       100.0 lb Date of Birth:  01/16/1922           BSA:          1.440 m Patient Age:    43 years            BP:           122/64 mmHg Patient Gender: F                   HR:           76 bpm. Exam Location:  [facility] Procedure: 2D Echo, Color Doppler and Cardiac Doppler Indications:     NSTEMI I21.4  History:         Patient has no prior history of Echocardiogram examinations.                   Risk Factors:Hypertension. CKD.  Sonographer:     Sherrie Sport RDCS (AE) Referring Phys:  [id number] Athena Masse Diagnosing Phys: Kate Sable MD  Sonographer Comments: Technically challenging study due to limited acoustic windows. Image acquisition challenging due to patient body habitus and Pt could not lie on left side nor supine. IMPRESSIONS  1. Left ventricular ejection fraction, by estimation, is 60 to 65%. The left ventricle has normal function. The left ventricle has no regional wall motion abnormalities. There is mild left ventricular hypertrophy. Left ventricular diastolic parameters are consistent with Grade II diastolic dysfunction (pseudonormalization).  2. Right ventricular systolic function is normal. The right ventricular size is normal.  3. Left atrial size was moderately dilated.  4. The mitral valve is degenerative. Mild mitral valve regurgitation.  5. The aortic valve is tricuspid. Aortic valve regurgitation is not visualized. Mild to moderate aortic valve sclerosis/calcification is present, without any evidence of aortic stenosis. FINDINGS  Left Ventricle: Left ventricular ejection fraction, by  estimation, is 60 to 65%. The left ventricle has normal function. The left ventricle has no regional wall motion abnormalities. The left ventricular internal cavity size was normal in size. There is  mild left ventricular hypertrophy. Left ventricular diastolic parameters are consistent with Grade II diastolic dysfunction (pseudonormalization). Right Ventricle: The right ventricular size is normal. No increase in right ventricular wall thickness. Right ventricular systolic function is normal. Left Atrium: Left atrial size was moderately dilated. Right Atrium: Right atrial size was normal in size. Pericardium: There is no evidence of pericardial effusion. Mitral Valve: The mitral valve is degenerative in appearance. There is moderate calcification of the mitral valve leaflet(s). Mild mitral valve  regurgitation. Tricuspid Valve: The tricuspid valve is not well visualized. Tricuspid valve regurgitation is mild. Aortic Valve: The aortic valve is tricuspid. Aortic valve regurgitation is not visualized. Mild to moderate aortic valve sclerosis/calcification is present, without any evidence of aortic stenosis. Aortic valve mean gradient measures 4.0 mmHg. Aortic valve peak gradient measures 8.5 mmHg. Aortic valve area, by VTI measures 2.16 cm. Pulmonic Valve: The pulmonic valve was not well visualized. Pulmonic valve regurgitation is not visualized. Aorta: The aortic root is normal in size and structure. Venous: The inferior vena cava was not well visualized. IAS/Shunts: There is right bowing of the interatrial septum, suggestive of elevated left atrial pressure. No atrial level shunt detected by color flow Doppler.  LEFT VENTRICLE PLAX 2D LVIDd:         2.92 cm  Diastology LVIDs:         1.84 cm  LV e' medial:    2.61 cm/s LV PW:         1.34 cm  LV E/e' medial:  17.4 LV IVS:        1.50 cm  LV e' lateral:   5.33 cm/s LVOT diam:     2.00 cm  LV E/e' lateral: 8.5 LV SV:         55 LV SV Index:   38 LVOT Area:     3.14 cm  LEFT ATRIUM             Index       RIGHT ATRIUM           Index LA diam:        3.90 cm 2.71 cm/m  RA Area:     10.50 cm LA Vol (A2C):   39.4 ml 27.36 ml/m RA Volume:   19.70 ml  13.68 ml/m LA Vol (A4C):   52.2 ml 36.24 ml/m LA Biplane Vol: 47.1 ml 32.70 ml/m  AORTIC VALVE                   PULMONIC VALVE AV Area (Vmax):    1.98 cm    PV Vmax:        0.51 m/s AV Area (Vmean):   2.19 cm    PV Peak grad:   1.0 mmHg AV Area (VTI):     2.16 cm    RVOT Peak grad: 1 mmHg AV Vmax:           146.00 cm/s AV Vmean:          94.400 cm/s AV VTI:            0.253 m AV Peak Grad:      8.5 mmHg AV Mean Grad:      4.0 mmHg LVOT Vmax:         92.00 cm/s LVOT Vmean:  65.800 cm/s LVOT VTI:          0.174 m LVOT/AV VTI ratio: 0.69  AORTA Ao Root diam: 3.20 cm MITRAL VALVE MV Area (PHT): 4.41 cm     SHUNTS MV Decel Time: 172 msec    Systemic VTI:  0.17 m MV E velocity: 45.40 cm/s  Systemic Diam: 2.00 cm MV A velocity: 39.80 cm/s MV E/A ratio:  1.14 Kate Sable MD Electronically signed by Kate Sable MD Signature Date/Time: 07/29/2021/3:00:57 PM    Final     Cardiac Studies   Echo as above, EF 60 to 65%    Patient Profile     87 y.o. female with history of hypertension presenting with altered mental status, poor oral intake being seen due to elevated troponins.  Assessment & Plan    Elevated troponins -Peaked at 36 -Due to age, frailty, comorbidities, not candidate for invasive therapy -S/p 48 hours of heparin -Continue beta-blocker 25 mg twice daily  2.  Nonsustained VT, 12 beats -Magnesium and potassium normal -No further episodes today -Echo with preserved EF -Switch Coreg to Lopressor.  Stop amlodipine. -Titrate beta-blocker as needed -Continue to monitor arrhythmias on telemetry  Total encounter time 35 minutes  Greater than 50% was spent in counseling and coordination of care with the patient     Signed, Kate Sable, MD  07/30/2021, 6:00 PM

## 2021-07-30 NOTE — NC FL2 (Signed)
[place] LEVEL OF CARE SCREENING TOOL     IDENTIFICATION  Patient Name: Autumn Johnston Birthdate: 23-Oct-1922 Sex: female Admission Date (Current Location): 07/27/2021  [facility] and [place] Number:  Engineering geologist and Address:  [facility], [address], [place], [place] [address]      Provider Number: [id number]  Attending Physician Name and Address:  Sharen Hones, MD  Relative Name and Phone Number:  [name] (daughter) [phone]    Current Level of Care: Hospital Recommended Level of Care: [place] Prior Approval Number:    Date Approved/Denied:   PASRR Number: [id number]  Discharge Plan: SNF    Current Diagnoses: Patient Active Problem List   Diagnosis Date Noted   Hyponatremia 07/29/2021   Demand ischemia ([place])    Hypertensive urgency 07/28/2021   Protein calorie malnutrition ([place]) 07/28/2021   Benign hypertension 07/27/2021   Hyperlipidemia 07/27/2021   Pernicious anemia 07/27/2021   Renal failure (ARF), acute on chronic ([place]) 07/27/2021   Acute hyponatremia 07/27/2021   Elevated troponin 84/69/6295   Acute metabolic encephalopathy 28/41/3244   Chronic insomnia 04/05/2017   Chronic kidney disease (CKD), stage IV (severe) ([place]) 12/25/2015   Anemia 07/20/2014    Orientation RESPIRATION BLADDER Height & Weight     Self  Normal Indwelling catheter Weight: 45.4 kg Height:  5\' 3"  (160 cm)  BEHAVIORAL SYMPTOMS/MOOD NEUROLOGICAL BOWEL NUTRITION STATUS      Continent Diet (see discharge summary)  AMBULATORY STATUS COMMUNICATION OF NEEDS Skin   Extensive Assist Verbally Normal                       Personal Care Assistance Level of Assistance  Bathing, Feeding, Dressing Bathing Assistance: Limited assistance Feeding assistance: Limited assistance Dressing Assistance: Limited assistance     Functional Limitations Info  Sight, Hearing, Speech Sight Info: Impaired Hearing Info:  Impaired Speech Info: Adequate    SPECIAL CARE FACTORS FREQUENCY  PT (By licensed PT), OT (By licensed OT)     PT Frequency: 5 times per week OT Frequency: 5 times per week            Contractures Contractures Info: Not present    Additional Factors Info  Code Status, Allergies Code Status Info: DNR Allergies Info: NKA           Current Medications (07/30/2021):  This is the current hospital active medication list Current Facility-Administered Medications  Medication Dose Route Frequency Provider Last Rate Last Admin   acetaminophen (TYLENOL) tablet 650 mg  650 mg Oral Q6H PRN Athena Masse, MD       Or   acetaminophen (TYLENOL) suppository 650 mg  650 mg Rectal Q6H PRN Athena Masse, MD       amLODipine (NORVASC) tablet 5 mg  5 mg Oral Daily Judd Gaudier V, MD   5 mg at 07/30/21 0102   aspirin EC tablet 81 mg  81 mg Oral Daily Furth, Cadence H, PA-C   81 mg at 07/30/21 7253   atorvastatin (LIPITOR) tablet 20 mg  20 mg Oral Daily Kate Sable, MD   20 mg at 07/30/21 0823   carvedilol (COREG) tablet 6.25 mg  6.25 mg Oral BID WC Furth, Cadence H, PA-C   6.25 mg at 07/30/21 6644   Chlorhexidine Gluconate Cloth 2 % PADS 6 each  6 each Topical Daily Sharen Hones, MD   6 each at 07/30/21 1033   feeding supplement (ENSURE ENLIVE /  ENSURE PLUS) liquid 237 mL  237 mL Oral BID BM Sharen Hones, MD   237 mL at 07/30/21 1403   haloperidol lactate (HALDOL) injection 0.5 mg  0.5 mg Intravenous Q6H PRN Athena Masse, MD       multivitamin with minerals tablet 1 tablet  1 tablet Oral Daily Murlean Iba, MD   1 tablet at 07/30/21 0823   ondansetron (ZOFRAN) tablet 4 mg  4 mg Oral Q6H PRN Athena Masse, MD       Or   ondansetron [facility]) injection 4 mg  4 mg Intravenous Q6H PRN Athena Masse, MD   4 mg at 07/28/21 1727   QUEtiapine (SEROQUEL) tablet 25 mg  25 mg Oral QHS Sharen Hones, MD         Discharge Medications: Please see discharge summary for a list of discharge  medications.  Relevant Imaging Results:  Relevant Lab Results:   Additional Information SS# [id number]  Shelbie Hutching, RN

## 2021-07-30 NOTE — TOC Initial Note (Signed)
Transition of Care [facility]) - Initial/Assessment Note    Patient Details  Name: Autumn Johnston MRN: [id number] Date of Birth: Jul 14, 1922  [facility] CM/SW Contact:    Shelbie Hutching, RN Phone Number: 07/30/2021, 2:24 PM  Clinical Narrative:                 Patient admitted to the hospital with hyponatremia and encephalopathy.  RNCM met with patient and her daughter's at the bedside.  Patient is notably confused talking worried about her insurance and cost and thinking that this RNCM is her granddaughter.  Patient's daughter Tawanna Sat and daughter Marcie Bal would like for patient to go to [facility] in [place] for rehab, RNCM answered questions about what Medicare covers and that it covers the first 20 days of SNF at 100% after that there is a copay per day up to 100 days and Medicare does not cover SNF after 100 days.  If family believes patient needs long term care they may want to look into Medicaid if they can not private pay for long term care.    SNF workup started - bed referral sent over to [facility]    Expected Discharge Plan: Skilled Nursing Facility Barriers to Discharge: SNF Pending bed offer   Patient Goals and CMS Choice Patient states their goals for this hospitalization and ongoing recovery are:: patient unable to state goals but family wants her to go to [facility] CMS Medicare.gov Compare Post Acute Care list provided to:: Patient Represenative (must comment) Choice offered to / list presented to : Adult Children  Expected Discharge Plan and Services Expected Discharge Plan: [place]   Discharge Planning Services: CM Consult Post Acute Care Choice: [place] Living arrangements for the past 2 months: Single Family Home                 DME Arranged: N/A DME Agency: NA       HH Arranged: NA HH Agency: NA        Prior Living Arrangements/Services Living arrangements for the past 2 months: Single Family Home Lives with::  Spouse Patient language and need for interpreter reviewed:: Yes Do you feel safe going back to the place where you live?: No   Patient can't walk  Need for Family Participation in Patient Care: Yes (Comment) (hyponatremia) Care giver support system in place?: Yes (comment) (daughters and husband) Current home services: DME (cane) Criminal Activity/Legal Involvement Pertinent to Current Situation/Hospitalization: No - Comment as needed  Activities of Daily Living Home Assistive Devices/Equipment: Cane (specify quad or straight) ADL Screening (condition at time of admission) Patient's cognitive ability adequate to safely complete daily activities?: No Is the patient deaf or have difficulty hearing?: Yes Does the patient have difficulty seeing, even when wearing glasses/contacts?: Yes Does the patient have difficulty concentrating, remembering, or making decisions?: Yes Patient able to express need for assistance with ADLs?: No Does the patient have difficulty dressing or bathing?: Yes Independently performs ADLs?: No Does the patient have difficulty walking or climbing stairs?: Yes Weakness of Legs: Both Weakness of Arms/Hands: None  Permission Sought/Granted Permission sought to share information with : Case Manager, Customer service manager, Family Supports Permission granted to share information with : Yes, Verbal Permission Granted  Share Information with NAME: Tawanna Sat  Permission granted to share info w AGENCY: [facility]  Permission granted to share info w Relationship: son     Emotional Assessment Appearance:: Appears stated age Attitude/Demeanor/Rapport: Self-Absorbed, Other (comment) (confused) Affect (typically  observed): Pleasant Orientation: : Oriented to Self Alcohol / Substance Use: Not Applicable Psych Involvement: No (comment)  Admission diagnosis:  Dehydration [E86.0] Hyponatremia [E87.1] Generalized weakness [R53.1] AKI (acute kidney injury) ([place])  [N17.9] Sepsis ([place]) [A41.9] Patient Active Problem List   Diagnosis Date Noted   Hyponatremia 07/29/2021   Demand ischemia ([place])    Hypertensive urgency 07/28/2021   Protein calorie malnutrition ([place]) 07/28/2021   Benign hypertension 07/27/2021   Hyperlipidemia 07/27/2021   Pernicious anemia 07/27/2021   Renal failure (ARF), acute on chronic ([facility]) 07/27/2021   Acute hyponatremia 07/27/2021   Elevated troponin 22/33/6122   Acute metabolic encephalopathy 44/97/5300   Chronic insomnia 04/05/2017   Chronic kidney disease (CKD), stage IV (severe) ([place]) 12/25/2015   Anemia 07/20/2014   PCP:  Idelle Crouch, MD Pharmacy:   [id number] [place] [id number]: [phone]: [phone]    Social Determinants of Health (SDOH) Interventions    Readmission Risk Interventions No flowsheet data found.

## 2021-07-31 DIAGNOSIS — E871 Hypo-osmolality and hyponatremia: Secondary | ICD-10-CM | POA: Diagnosis not present

## 2021-07-31 DIAGNOSIS — E43 Unspecified severe protein-calorie malnutrition: Secondary | ICD-10-CM | POA: Insufficient documentation

## 2021-07-31 DIAGNOSIS — I248 Other forms of acute ischemic heart disease: Secondary | ICD-10-CM | POA: Diagnosis not present

## 2021-07-31 LAB — BASIC METABOLIC PANEL
Anion gap: 5 (ref 5–15)
BUN: 41 mg/dL — ABNORMAL HIGH (ref 8–23)
CO2: 26 mmol/L (ref 22–32)
Calcium: 8.6 mg/dL — ABNORMAL LOW (ref 8.9–10.3)
Chloride: 97 mmol/L — ABNORMAL LOW (ref 98–111)
Creatinine, Ser: 1.4 mg/dL — ABNORMAL HIGH (ref 0.44–1.00)
GFR, Estimated: 34 mL/min — ABNORMAL LOW (ref >60)
Glucose, Bld: 90 mg/dL (ref 70–99)
Potassium: 4 mmol/L (ref 3.5–5.1)
Sodium: 128 mmol/L — ABNORMAL LOW (ref 135–145)

## 2021-07-31 LAB — SODIUM: Sodium: 131 mmol/L — ABNORMAL LOW (ref 135–145)

## 2021-07-31 MED ORDER — LORAZEPAM 0.5 MG PO TABS: 0.5000 mg | ORAL_TABLET | ORAL | Status: DC | PRN

## 2021-07-31 MED ORDER — ENSURE ENLIVE PO LIQD
237.0000 mL | Freq: Three times a day (TID) | ORAL | Status: DC
  Administered 2021-07-31 – 2021-08-01 (×2): 237 mL via ORAL

## 2021-07-31 MED ORDER — SODIUM CHLORIDE 0.9 % IV SOLN: INTRAVENOUS | Status: DC

## 2021-07-31 MED ORDER — FOSFOMYCIN TROMETHAMINE 3 G PO PACK
3.0000 g | PACK | Freq: Once | ORAL | Status: AC
  Administered 2021-07-31: 13:00:00 3 g via ORAL
  Filled 2021-07-31: qty 3

## 2021-07-31 MED ORDER — SODIUM CHLORIDE 1 G PO TABS
1.0000 g | ORAL_TABLET | Freq: Two times a day (BID) | ORAL | Status: DC
  Administered 2021-07-31 – 2021-08-01 (×3): 1 g via ORAL
  Filled 2021-07-31 (×3): qty 1

## 2021-07-31 NOTE — Progress Notes (Signed)
PROGRESS NOTE    Autumn Johnston  QXI:[id number] DOB: [birth year] DOA: 07/27/2021 PCP: Idelle Crouch, MD   Chief complaint altered mental status. Brief Narrative:  Autumn Johnston is a 87 y.o. female with medical history significant for HTN, CKD4, colon cancer s/p colectomy with colostomy who lives at home with her husband and is independent and takes care of all her financial affairs herself who was brought into the ED with a concern for weakness and altered mental status.  Patient had hypocalcemia with a potassium of 6.4 in May, since that time, he was advised to drink excessive amount of fluids.  She has been drinking large amount of water on a daily basis.  Upon arriving the emergency room, she was found to have altered mental status with potassium 113.  He was given 3% sodium chloride.  Consult from nephrology is also obtained. 8/11.  Patient developed hematuria, urine culture grew gram-negative rods, will give a dose of fosfomycin.  Added sodium tablet for hyponatremia.   Assessment & Plan:   Principal Problem:   Acute hyponatremia Active Problems:   Chronic kidney disease (CKD), stage IV (severe) (HCC)   Elevated troponin   Acute metabolic encephalopathy   Hypertensive urgency   Protein calorie malnutrition (HCC)   Hyponatremia   Nonsustained ventricular tachycardia (HCC)  #1. severe hyponatremia Acute metabolic encephalopathy secondary to hyponatremia. Patient sodium level dropped down to 128 from 131 yesterday.  Renal function slightly worse.  Discussed with nephrology, started fluids with normal saline.  Also started sodium chloride 1 g oral twice a day. Patient was also given Seroquel yesterday, he slept through the night.  Her mental status much improved today.  #2.  Gram-negative rods urinary tract infection with hematuria. Patient still has a Foley catheter, will give a dose of fosfomycin.  Patient does not have sepsis.  #3.  Elevated troponin secondary to  demand ischemia. Patient received a 2-day of IV heparin drip.  Discontinued yesterday.  4.  Chronic kidney disease stage IV. Anemia of chronic disease. Renal function started worsening today, will start IV fluids.  5.  Severe protein calorie malnutrition. continue supplement  6.  Nonsustained mention tachycardia  Asymptomatic.  Followed by cardiology.   DVT prophylaxis: SCDs Code Status: DNR Family Communication: Son updated. Disposition Plan:    Status is: Inpatient  Remains inpatient appropriate because:IV treatments appropriate due to intensity of illness or inability to take PO and Inpatient level of care appropriate due to severity of illness  Dispo: The patient is from: Home              Anticipated d/c is to: SNF              Patient currently is not medically stable to d/c.   Difficult to place patient No        I/O last 3 completed shifts: In: 371.4 [P.O.:120; I.V.:251.4] Out: 1800 [Urine:1800] No intake/output data recorded.     Consultants:  Nephrology and cadiology  Procedures: None  Antimicrobials: Fosfomycin.  Subjective: Patient mental status much improved today, her memory is coming back.  She has no confusion. She still has a Foley catheter, she is develop hematuria. No fever chills  No short of breath or cough. No chest pain or palpitation.  Objective: Vitals:   07/30/21 1641 07/30/21 2042 07/31/21 0405 07/31/21 0738  BP: (!) 95/53 (!) 122/49 (!) 131/55 (!) 124/51  Pulse: 61 64 (!) 55 (!) 52  Resp: 18 20 16  16  Temp: 98 F (36.7 C) 98.6 F (37 C) 98.1 F (36.7 C) 98.2 F (36.8 C)  TempSrc: Oral     SpO2: 94% 99% 99% 98%  Weight:      Height:        Intake/Output Summary (Last 24 hours) at 07/31/2021 1215 Last data filed at 07/31/2021 0412 Gross per 24 hour  Intake --  Output 1150 ml  Net -1150 ml   Filed Weights   07/27/21 2315  Weight: 45.4 kg    Examination:  General exam: Appears calm and comfortable, appear  malnourished Respiratory system: Clear to auscultation. Respiratory effort normal. Cardiovascular system: S1 & S2 heard, RRR. No JVD, murmurs, rubs, gallops or clicks. No pedal edema. Gastrointestinal system: Abdomen is nondistended, soft and nontender. No organomegaly or masses felt. Normal bowel sounds heard. Central nervous system: Alert and oriented x2.  No focal neurological deficits. Extremities: Symmetric 5 x 5 power. Skin: No rashes, lesions or ulcers Psychiatry: Judgement and insight appear normal. Mood & affect appropriate.     Data Reviewed: I have personally reviewed following labs and imaging studies  CBC: Recent Labs  Lab 07/27/21 2136 07/29/21 0515 07/30/21 0614  WBC 8.0 6.5 7.0  NEUTROABS 6.9 4.5  --   HGB 13.8 10.3* 9.9*  HCT 37.5 29.2* 28.6*  MCV 83.1 86.4 85.6  PLT 323 238 400   Basic Metabolic Panel: Recent Labs  Lab 07/27/21 2136 07/28/21 0110 07/28/21 0653 07/28/21 0955 07/29/21 0515 07/29/21 1558 07/29/21 2338 07/30/21 0614 07/31/21 0529  NA 113*   < > 116*   < > 124* 128* 130* 131* 128*  K 4.4  --  3.9  --  3.8  --   --  3.6 4.0  CL 79*  --  83*  --  95*  --   --  100 97*  CO2 21*  --  21*  --  21*  --   --  20* 26  GLUCOSE 119*  --  115*  --  76  --   --  94 90  BUN 26*  --  24*  --  22  --   --  31* 41*  CREATININE 1.25*  --  1.01*  --  1.16*  --   --  1.28* 1.40*  CALCIUM 9.2  --  8.5*  --  7.9*  --   --  8.4* 8.6*  MG  --   --   --   --  1.6*  --   --  2.4  --    < > = values in this interval not displayed.   GFR: Estimated Creatinine Clearance: 16.8 mL/min (A) (by C-G formula based on SCr of 1.4 mg/dL (H)). Liver Function Tests: Recent Labs  Lab 07/27/21 2136  AST 43*  ALT 23  ALKPHOS 53  BILITOT 1.5*  PROT 7.9  ALBUMIN 4.6   Recent Labs  Lab 07/27/21 2136  LIPASE 41   No results for input(s): AMMONIA in the last 168 hours. Coagulation Profile: Recent Labs  Lab 07/27/21 2136  INR 0.9   Cardiac Enzymes: No results  for input(s): CKTOTAL, CKMB, CKMBINDEX, TROPONINI in the last 168 hours. BNP (last 3 results) No results for input(s): PROBNP in the last 8760 hours. HbA1C: No results for input(s): HGBA1C in the last 72 hours. CBG: No results for input(s): GLUCAP in the last 168 hours. Lipid Profile: No results for input(s): CHOL, HDL, LDLCALC, TRIG, CHOLHDL, LDLDIRECT in the last 72 hours. Thyroid Function  Tests: No results for input(s): TSH, T4TOTAL, FREET4, T3FREE, THYROIDAB in the last 72 hours. Anemia Panel: Recent Labs    07/29/21 1321  VITAMINB12 257  FOLATE 18.9  TIBC 262  IRON 147   Sepsis Labs: Recent Labs  Lab 07/27/21 2136 07/28/21 0110  LATICACIDVEN 1.8 2.5*    Recent Results (from the past 240 hour(s))  Culture, blood (Routine x 2)     Status: None (Preliminary result)   Collection Time: 07/27/21  9:36 PM   Specimen: BLOOD  Result Value Ref Range Status   Specimen Description BLOOD BLOOD LEFT FOREARM  Final   Special Requests   Final    BOTTLES DRAWN AEROBIC AND ANAEROBIC Blood Culture results may not be optimal due to an inadequate volume of blood received in culture bottles   Culture   Final    NO GROWTH 4 DAYS Performed at [facility], [address], [place], [place] [address]    Report Status PENDING  Incomplete  Resp Panel by RT-PCR (Flu A&B, Covid) Nasopharyngeal Swab     Status: None   Collection Time: 07/27/21  9:36 PM   Specimen: Nasopharyngeal Swab; Nasopharyngeal(NP) swabs in vial transport medium  Result Value Ref Range Status   SARS Coronavirus 2 by RT PCR NEGATIVE NEGATIVE Final    Comment: (NOTE) SARS-CoV-2 target nucleic acids are NOT DETECTED.  The SARS-CoV-2 RNA is generally detectable in upper respiratory specimens during the acute phase of infection. The lowest concentration of SARS-CoV-2 viral copies this assay can detect is 138 copies/mL. A negative result does not preclude SARS-Cov-2 infection and should not be used as the sole  basis for treatment or other patient management decisions. A negative result may occur with  improper specimen collection/handling, submission of specimen other than nasopharyngeal swab, presence of viral mutation(s) within the areas targeted by this assay, and inadequate number of viral copies(<138 copies/mL). A negative result must be combined with clinical observations, patient history, and epidemiological information. The expected result is Negative.  Fact Sheet for Patients:  EntrepreneurPulse.com.au  Fact Sheet for Healthcare Providers:  IncredibleEmployment.be  This test is no t yet approved or cleared by the [place] FDA and  has been authorized for detection and/or diagnosis of SARS-CoV-2 by FDA under an Emergency Use Authorization (EUA). This EUA will remain  in effect (meaning this test can be used) for the duration of the COVID-19 declaration under Section 564(b)(1) of the Act, 21 U.S.C.section 360bbb-3(b)(1), unless the authorization is terminated  or revoked sooner.       Influenza A by PCR NEGATIVE NEGATIVE Final   Influenza B by PCR NEGATIVE NEGATIVE Final    Comment: (NOTE) The Xpert Xpress SARS-CoV-2/FLU/RSV plus assay is intended as an aid in the diagnosis of influenza from Nasopharyngeal swab specimens and should not be used as a sole basis for treatment. Nasal washings and aspirates are unacceptable for Xpert Xpress SARS-CoV-2/FLU/RSV testing.  Fact Sheet for Patients: EntrepreneurPulse.com.au  Fact Sheet for Healthcare Providers: IncredibleEmployment.be  This test is not yet approved or cleared by the [place] FDA and has been authorized for detection and/or diagnosis of SARS-CoV-2 by FDA under an Emergency Use Authorization (EUA). This EUA will remain in effect (meaning this test can be used) for the duration of the COVID-19 declaration under Section 564(b)(1) of the Act,  21 U.S.C. section 360bbb-3(b)(1), unless the authorization is terminated or revoked.  Performed at [facility], [address] [place], [place] [address]   Culture, blood (Routine  x 2)     Status: None (Preliminary result)   Collection Time: 07/28/21  1:10 AM   Specimen: BLOOD  Result Value Ref Range Status   Specimen Description BLOOD BLOOD RIGHT FOREARM  Final   Special Requests   Final    BOTTLES DRAWN AEROBIC AND ANAEROBIC Blood Culture adequate volume   Culture   Final    NO GROWTH 3 DAYS Performed at [facility], [address], [place], [place] [address]    Report Status PENDING  Incomplete  Urine Culture     Status: Abnormal (Preliminary result)   Collection Time: 07/30/21 11:01 AM   Specimen: Urine, Catheterized  Result Value Ref Range Status   Specimen Description   Final    URINE, CATHETERIZED Performed at [facility], [address], [place], [place] [address]    Special Requests   Final    NONE Performed at [facility], [address], [place], [place] [address]    Culture (A)  Final    >=100,000 COLONIES/mL Lonell Grandchild NEGATIVE RODS SUSCEPTIBILITIES TO FOLLOW Performed at [facility], [place] [address] [place], [place] [address]    Report Status PENDING  Incomplete         Radiology Studies: No results found.      Scheduled Meds:  aspirin EC  81 mg Oral Daily   atorvastatin  20 mg Oral Daily   Chlorhexidine Gluconate Cloth  6 each Topical Daily   COVID-19 mRNA vaccine (Moderna)  0.25 mL Intramuscular ONCE-1600   feeding supplement  237 mL Oral BID BM   fosfomycin  3 g Oral Once   metoprolol tartrate  25 mg Oral BID   multivitamin with minerals  1 tablet Oral Daily   QUEtiapine  25 mg Oral QHS   sodium chloride  1 g Oral BID WC   Continuous Infusions:  sodium chloride 75 mL/hr at 07/31/21 0816     LOS: 4 days    Time spent: 32 minutes, more than 50% time involved in direct patient  care.    Sharen Hones, MD [facility]   To contact the attending provider between 7A-7P or the covering provider during after hours 7P-7A, please log into the web site [email or web address] and access using universal [place] password for that web site. If you do not have the password, please call the hospital operator.  07/31/2021, 12:15 PM

## 2021-07-31 NOTE — Care Management Important Message (Signed)
Important Message  Patient Details  Name: Autumn Johnston MRN: [id number] Date of Birth: [birth year]   Medicare Important Message Given:      Patient's daughter, [name] was in the patient room [phone]) and they are pleased with the discharge plan. Answered her questions and thanked her for time.   Juliann Pulse A Delayni Streed 07/31/2021, 3:12 PM

## 2021-07-31 NOTE — TOC Progression Note (Signed)
Transition of Care [facility]) - Progression Note    Patient Details  Name: Autumn Johnston MRN: [id number] Date of Birth: 1922-04-18  Transition of Care [facility]) CM/SW Contact  Shelbie Hutching, RN Phone Number: 07/31/2021, 1:25 PM  Clinical Narrative:    Patient is not medically ready for discharge today, [facility] is updated.  Patient may be ready for discharge tomorrow.     Expected Discharge Plan: Skilled Nursing Facility Barriers to Discharge: SNF Pending bed offer  Expected Discharge Plan and Services Expected Discharge Plan: [place]   Discharge Planning Services: CM Consult Post Acute Care Choice: [place] Living arrangements for the past 2 months: Single Family Home                 DME Arranged: N/A DME Agency: NA       HH Arranged: NA HH Agency: NA         Social Determinants of Health (SDOH) Interventions    Readmission Risk Interventions No flowsheet data found.

## 2021-07-31 NOTE — Progress Notes (Signed)
Progress Note  Patient Name: Autumn Johnston Date of Encounter: 07/31/2021  Primary Cardiologist: [facility], Dr. Fletcher Anon  Subjective   No CP, SOB, or tachypalpitations.   Confusion noted, worsened by the fact that she cannot hear well. Her son (present during today's interview) helped to repeat all unheard information, though confusion still noted even when she hears the information.  She reportedly lived at home with her husband prior to this admission and ambulated with both walker and cane.   Long discussion regarding pt and son concerns. Son indicated concern regarding pt concern to home - reassurance provided that pt will be discharged to a facility if needed and per IM. Son indicated concern regarding pt nutrition. Pt indicated concern that she won't receive her AM medications and was reassured this was not the case.   Inpatient Medications    Scheduled Meds:  aspirin EC  81 mg Oral Daily   atorvastatin  20 mg Oral Daily   Chlorhexidine Gluconate Cloth  6 each Topical Daily   COVID-19 mRNA vaccine (Moderna)  0.25 mL Intramuscular ONCE-1600   feeding supplement  237 mL Oral BID BM   metoprolol tartrate  25 mg Oral BID   multivitamin with minerals  1 tablet Oral Daily   QUEtiapine  25 mg Oral QHS   sodium chloride  1 g Oral BID WC   Continuous Infusions:  sodium chloride 75 mL/hr at 07/31/21 0816   PRN Meds: acetaminophen **OR** acetaminophen, haloperidol lactate, ondansetron **OR** ondansetron (ZOFRAN) IV   Vital Signs    Vitals:   07/30/21 1641 07/30/21 2042 07/31/21 0405 07/31/21 0738  BP: (!) 95/53 (!) 122/49 (!) 131/55 (!) 124/51  Pulse: 61 64 (!) 55 (!) 52  Resp: 18 20 16 16   Temp: 98 F (36.7 C) 98.6 F (37 C) 98.1 F (36.7 C) 98.2 F (36.8 C)  TempSrc: Oral     SpO2: 94% 99% 99% 98%  Weight:      Height:        Intake/Output Summary (Last 24 hours) at 07/31/2021 1024 Last data filed at 07/31/2021 4270 Gross per 24 hour  Intake --  Output 1350 ml   Net -1350 ml   Last 3 Weights 07/27/2021  Weight (lbs) 100 lb  Weight (kg) 45.36 kg      Telemetry    SB-SR, earlier 12 beat episode of NSVT on 8/10 - Personally Reviewed  ECG    No new tracings - Personally Reviewed  Physical Exam   GEN: Frail and cachetic, elderly female   Neck: No JVD Cardiac: RRR, no murmurs, rubs, or gallops.  Respiratory: Clear to auscultation bilaterally. GI: Soft, nontender, non-distended  MS: No edema; No deformity. Neuro:  Nonfocal  Psych: Normal affect   Labs    High Sensitivity Troponin:   Recent Labs  Lab 07/27/21 2136 07/27/21 2234 07/28/21 0110 07/28/21 0428  TROPONINIHS 128* 187* 476* 406*      Chemistry Recent Labs  Lab 07/27/21 2136 07/28/21 0110 07/29/21 0515 07/29/21 1558 07/29/21 2338 07/30/21 0614 07/31/21 0529  NA 113*   < > 124*   < > 130* 131* 128*  K 4.4   < > 3.8  --   --  3.6 4.0  CL 79*   < > 95*  --   --  100 97*  CO2 21*   < > 21*  --   --  20* 26  GLUCOSE 119*   < > 76  --   --  94 90  BUN 26*   < > 22  --   --  31* 41*  CREATININE 1.25*   < > 1.16*  --   --  1.28* 1.40*  CALCIUM 9.2   < > 7.9*  --   --  8.4* 8.6*  PROT 7.9  --   --   --   --   --   --   ALBUMIN 4.6  --   --   --   --   --   --   AST 43*  --   --   --   --   --   --   ALT 23  --   --   --   --   --   --   ALKPHOS 53  --   --   --   --   --   --   BILITOT 1.5*  --   --   --   --   --   --   GFRNONAA 39*   < > 43*  --   --  38* 34*  ANIONGAP 13   < > 8  --   --  11 5   < > = values in this interval not displayed.     Hematology Recent Labs  Lab 07/27/21 2136 07/29/21 0515 07/30/21 0614  WBC 8.0 6.5 7.0  RBC 4.51 3.38* 3.34*  HGB 13.8 10.3* 9.9*  HCT 37.5 29.2* 28.6*  MCV 83.1 86.4 85.6  MCH 30.6 30.5 29.6  MCHC 36.8* 35.3 34.6  RDW 12.5 12.7 13.2  PLT 323 238 239    BNP Recent Labs  Lab 07/27/21 2136  BNP 260.0*     DDimer No results for input(s): DDIMER in the last 168 hours.   Radiology    ECHOCARDIOGRAM  COMPLETE  Result Date: 07/29/2021    ECHOCARDIOGRAM REPORT   Patient Name:   Autumn Johnston Date of Exam: 07/29/2021 Medical Rec #:  [id number]           Height:       63.0 in Accession #:    [id number]          Weight:       100.0 lb Date of Birth:  1922-06-13           BSA:          1.440 m Patient Age:    87 years            BP:           122/64 mmHg Patient Gender: F                   HR:           76 bpm. Exam Location:  [facility] Procedure: 2D Echo, Color Doppler and Cardiac Doppler Indications:     NSTEMI I21.4  History:         Patient has no prior history of Echocardiogram examinations.                  Risk Factors:Hypertension. CKD.  Sonographer:     Sherrie Sport RDCS (AE) Referring Phys:  [id number] Athena Masse Diagnosing Phys: Kate Sable MD  Sonographer Comments: Technically challenging study due to limited acoustic windows. Image acquisition challenging due to patient body habitus and Pt could not lie on left side nor supine. IMPRESSIONS  1. Left ventricular ejection fraction, by estimation, is 60 to 65%. The  left ventricle has normal function. The left ventricle has no regional wall motion abnormalities. There is mild left ventricular hypertrophy. Left ventricular diastolic parameters are consistent with Grade II diastolic dysfunction (pseudonormalization).  2. Right ventricular systolic function is normal. The right ventricular size is normal.  3. Left atrial size was moderately dilated.  4. The mitral valve is degenerative. Mild mitral valve regurgitation.  5. The aortic valve is tricuspid. Aortic valve regurgitation is not visualized. Mild to moderate aortic valve sclerosis/calcification is present, without any evidence of aortic stenosis. FINDINGS  Left Ventricle: Left ventricular ejection fraction, by estimation, is 60 to 65%. The left ventricle has normal function. The left ventricle has no regional wall motion abnormalities. The left ventricular internal cavity size was normal in size. There  is  mild left ventricular hypertrophy. Left ventricular diastolic parameters are consistent with Grade II diastolic dysfunction (pseudonormalization). Right Ventricle: The right ventricular size is normal. No increase in right ventricular wall thickness. Right ventricular systolic function is normal. Left Atrium: Left atrial size was moderately dilated. Right Atrium: Right atrial size was normal in size. Pericardium: There is no evidence of pericardial effusion. Mitral Valve: The mitral valve is degenerative in appearance. There is moderate calcification of the mitral valve leaflet(s). Mild mitral valve regurgitation. Tricuspid Valve: The tricuspid valve is not well visualized. Tricuspid valve regurgitation is mild. Aortic Valve: The aortic valve is tricuspid. Aortic valve regurgitation is not visualized. Mild to moderate aortic valve sclerosis/calcification is present, without any evidence of aortic stenosis. Aortic valve mean gradient measures 4.0 mmHg. Aortic valve peak gradient measures 8.5 mmHg. Aortic valve area, by VTI measures 2.16 cm. Pulmonic Valve: The pulmonic valve was not well visualized. Pulmonic valve regurgitation is not visualized. Aorta: The aortic root is normal in size and structure. Venous: The inferior vena cava was not well visualized. IAS/Shunts: There is right bowing of the interatrial septum, suggestive of elevated left atrial pressure. No atrial level shunt detected by color flow Doppler.  LEFT VENTRICLE PLAX 2D LVIDd:         2.92 cm  Diastology LVIDs:         1.84 cm  LV e' medial:    2.61 cm/s LV PW:         1.34 cm  LV E/e' medial:  17.4 LV IVS:        1.50 cm  LV e' lateral:   5.33 cm/s LVOT diam:     2.00 cm  LV E/e' lateral: 8.5 LV SV:         55 LV SV Index:   38 LVOT Area:     3.14 cm  LEFT ATRIUM             Index       RIGHT ATRIUM           Index LA diam:        3.90 cm 2.71 cm/m  RA Area:     10.50 cm LA Vol (A2C):   39.4 ml 27.36 ml/m RA Volume:   19.70 ml  13.68 ml/m  LA Vol (A4C):   52.2 ml 36.24 ml/m LA Biplane Vol: 47.1 ml 32.70 ml/m  AORTIC VALVE                   PULMONIC VALVE AV Area (Vmax):    1.98 cm    PV Vmax:        0.51 m/s AV Area (Vmean):   2.19 cm  PV Peak grad:   1.0 mmHg AV Area (VTI):     2.16 cm    RVOT Peak grad: 1 mmHg AV Vmax:           146.00 cm/s AV Vmean:          94.400 cm/s AV VTI:            0.253 m AV Peak Grad:      8.5 mmHg AV Mean Grad:      4.0 mmHg LVOT Vmax:         92.00 cm/s LVOT Vmean:        65.800 cm/s LVOT VTI:          0.174 m LVOT/AV VTI ratio: 0.69  AORTA Ao Root diam: 3.20 cm MITRAL VALVE MV Area (PHT): 4.41 cm    SHUNTS MV Decel Time: 172 msec    Systemic VTI:  0.17 m MV E velocity: 45.40 cm/s  Systemic Diam: 2.00 cm MV A velocity: 39.80 cm/s MV E/A ratio:  1.14 Kate Sable MD Electronically signed by Kate Sable MD Signature Date/Time: 07/29/2021/3:00:57 PM    Final     Cardiac Studies   Echo 07/29/21.  Left ventricular ejection fraction, by estimation, is 60 to 65%. The  left ventricle has normal function. The left ventricle has no regional  wall motion abnormalities. There is mild left ventricular hypertrophy.  Left ventricular diastolic parameters  are consistent with Grade II diastolic dysfunction (pseudonormalization).   2. Right ventricular systolic function is normal. The right ventricular  size is normal.   3. Left atrial size was moderately dilated.   4. The mitral valve is degenerative. Mild mitral valve regurgitation.   5. The aortic valve is tricuspid. Aortic valve regurgitation is not  visualized. Mild to moderate aortic valve sclerosis/calcification is  present, without any evidence of aortic stenosis.   Patient Profile     87 y.o. female with history of HTN and presenting with altered mental status and poor oral intake with cardiology consulted for elevated troponins and s/p 48 hours of IV heparin with NSVT noted on telemetry.  Assessment & Plan    Elevated high-sensitivity  troponin, likely supply demand ischemia --No chest pain.  Elevated high-sensitivity troponin peaked at 476, downtrending. EKG without acute ST/T changes. HS Tn elevated in setting of poor oral intake and significantly elevated BP and hyponatremia with AKI at presentation.  --Suspect elevation due to supply demand ischemia. Echo obtained with EF nl, NRWMA.  No plan for cardiac catheterization or further ischemic workup at this time given age, frailty, and comorbids.  Now s/p 48 hours of IV heparin.   --Continue ASA, BB with consolidation to Toprol-XL (likely Toprol XL 50mg  daily) before discharge.  Continue atorvastatin 20 mg daily. Risk factor modification, BP control, and tx of underlying conditions recommended.   Nonsustained ventricular tachycardia --Denies any tachypalpitations.  Potassium now at goal.  12 beat NSVT noted 8/10 and without recurrence.  Echo with preserved EF.  Check TSH. Consolidation of lopressor to Toprol XL daily before discharge.    G2DD -- Denies shortness of breath.  Euvolemic and well compensated on exam.  Echo with EF 60 to 65%, G2DD, and findings as above. No indication for a diuretic and continue to hold ARB given AKI. Consolidation of lopressor to Toprol XL 50mg  daily before discharge.  Essential Hypertension --BP well controlled and improved from initial BP. Continue current medications as outlined above.   Confusion Dementia, hyponatremia --Noted to have baseline  dementia, and with confusion likely worsened by hyponatremia and hearing difficulties. Daily BMET recommended to monitor Na, electrolytes.  AKI, electrolyte abnormalities --Continue to monitor with daily BMET. Holding ARB.  For questions or updates, please contact [place] Please consult [email or web address] for contact info under        Signed, Arvil Chaco, PA-C  07/31/2021, 10:24 AM

## 2021-07-31 NOTE — Progress Notes (Signed)
Initial Nutrition Assessment  DOCUMENTATION CODES:   Severe malnutrition in context of social or environmental circumstances, Underweight  INTERVENTION:  Continue current diet as ordered. Increase ensure enlive to TID, each supplement provides 350 kcal and 20 grams of protein Magic cup TID with meals, each supplement provides 290 kcal and 9 grams of protein MVI with minerals daily Snacks BID  NUTRITION DIAGNOSIS:  Severe Malnutrition (in the context of social/environmental circumstances) related to poor appetite as evidenced by severe fat depletion, severe muscle depletion.  GOAL:  Patient will meet greater than or equal to 90% of their needs  MONITOR:  PO intake, Supplement acceptance, Skin  REASON FOR ASSESSMENT:  Other (Comment) (low BMI)    ASSESSMENT:  87 y.o. female who presents via EMS for generalized weakness and altered mental status. Medical history significant for HTN, CKD4, colon cancer s/p colectomy with colostomy   Pt resting in bed at the time of assessment, daughter at bedside. Lunch tray observed at bedside, minimally consumed. Daughter reports that pt has not been eating very well, taking in small amounts at her meals and then will state she is hungry a few hours later. Daughter is unsure if pt has received an ensure, has not seen. Pt unable to provide a hx, very worried about getting home and getting her bills paid at the moment.   Pt reports that she does like ensure, will recommend sending between meals as a snack as pt has not been eating well. Will also order snacks BID to be delivered from dining services.   Poor dentition noted along with severe muscle and fat deficits. Unsure of usual weight, but bed weight reads ~94 lbs today.   Average Meal Intake: 8/7-8/11: 28% intake x 2 recorded meals  Nutritionally Relevant Medications: Scheduled Meds:  atorvastatin  20 mg Oral Daily   feeding supplement  237 mL Oral BID BM   multivitamin with minerals  1  tablet Oral Daily   sodium chloride  1 g Oral BID WC   Continuous Infusions:  sodium chloride 75 mL/hr at 07/31/21 0816   PRN Meds: ondansetron  Labs Reviewed: Na 131 BUN 41, creatinine 1.4  NUTRITION - FOCUSED PHYSICAL EXAM: Flowsheet Row Most Recent Value  Orbital Region Moderate depletion  Upper Arm Region Severe depletion  Thoracic and Lumbar Region Severe depletion  Buccal Region Moderate depletion  Temple Region Moderate depletion  Clavicle Bone Region Severe depletion  Clavicle and Acromion Bone Region Severe depletion  Scapular Bone Region Moderate depletion  Dorsal Hand Severe depletion  Patellar Region Moderate depletion  Anterior Thigh Region Moderate depletion  Posterior Calf Region Moderate depletion  Edema (RD Assessment) None  Hair Reviewed  Eyes Reviewed  Mouth Reviewed  [poor dentition]  Skin Reviewed  [bruising]  Nails Reviewed   Diet Order:   Diet Order             Diet regular Room service appropriate? Yes; Fluid consistency: Thin; Fluid restriction: 1800 mL Fluid  Diet effective now                   EDUCATION NEEDS:  No education needs have been identified at this time  Skin:  Skin Assessment: Reviewed RN Assessment  Last BM:  8/10  Height:  Ht Readings from Last 1 Encounters:  07/27/21 5\' 3"  (1.6 m)    Weight:  Wt Readings from Last 1 Encounters:  07/31/21 42.7 kg    Ideal Body Weight:  52.3 kg  BMI:  Body  mass index is 16.68 kg/m.  Estimated Nutritional Needs:  Kcal:  1300-1500 kcal/d Protein:  70-80g/d Fluid:  1500-1500 mL/d   Ranell Patrick, RD, LDN Clinical Dietitian Pager on Amion

## 2021-07-31 NOTE — Progress Notes (Signed)
[facility]  ROUNDING NOTE   Subjective:   Autumn Johnston is a 87 y.o. female presenting to the ED with weakness, shortness of breath and nausea. She has been admitted for Dehydration [E86.0] Hyponatremia [E87.1] Generalized weakness [R53.1] AKI (acute kidney injury) ([place]) [N17.9] Sepsis ([place]) [A41.9]   Patient seen sitting in chair Alert and oriented to person, place and situation.  Tolerating meals Denies shortness of breath Patient seen later back in bed with son at bedside He voices concerns about patients diet and wanting guidelines to prevent this same occurrence.  Foley  Objective:  Vital signs in last 24 hours:  Temp:  [98 F (36.7 C)-98.6 F (37 C)] 98.2 F (36.8 C) (08/11 0738) Pulse Rate:  [52-66] 52 (08/11 0738) Resp:  [16-20] 16 (08/11 0738) BP: (95-131)/(49-55) 124/51 (08/11 0738) SpO2:  [94 %-99 %] 98 % (08/11 0738)  Weight change:  Filed Weights   07/27/21 2315  Weight: 45.4 kg    Intake/Output: I/O last 3 completed shifts: In: 371.4 [P.O.:120; I.V.:251.4] Out: 1800 [Urine:1800]   Intake/Output this shift:  No intake/output data recorded.  Physical Exam: General: NAD, sitting up in bed  Head: Normocephalic, atraumatic. Moist oral mucosal membranes  Eyes: Anicteric  Lungs:  Clear to auscultation, normal effort  Heart: Regular rate and rhythm  Abdomen:  Soft, nontender  Extremities:  no peripheral edema.  Neurologic: Alert to person and place, moving all four extremities  Skin: No lesions  GU Foley-pink urine    Basic Metabolic Panel: Recent Labs  Lab 07/27/21 2136 07/28/21 0110 07/28/21 2703 07/28/21 0955 07/29/21 0515 07/29/21 1558 07/29/21 2338 07/30/21 0614 07/31/21 0529  NA 113*   < > 116*   < > 124* 128* 130* 131* 128*  K 4.4  --  3.9  --  3.8  --   --  3.6 4.0  CL 79*  --  83*  --  95*  --   --  100 97*  CO2 21*  --  21*  --  21*  --   --  20* 26  GLUCOSE 119*  --  115*  --  76  --   --  94 90  BUN 26*   --  24*  --  22  --   --  31* 41*  CREATININE 1.25*  --  1.01*  --  1.16*  --   --  1.28* 1.40*  CALCIUM 9.2  --  8.5*  --  7.9*  --   --  8.4* 8.6*  MG  --   --   --   --  1.6*  --   --  2.4  --    < > = values in this interval not displayed.     Liver Function Tests: Recent Labs  Lab 07/27/21 2136  AST 43*  ALT 23  ALKPHOS 53  BILITOT 1.5*  PROT 7.9  ALBUMIN 4.6    Recent Labs  Lab 07/27/21 2136  LIPASE 41    No results for input(s): AMMONIA in the last 168 hours.  CBC: Recent Labs  Lab 07/27/21 2136 07/29/21 0515 07/30/21 0614  WBC 8.0 6.5 7.0  NEUTROABS 6.9 4.5  --   HGB 13.8 10.3* 9.9*  HCT 37.5 29.2* 28.6*  MCV 83.1 86.4 85.6  PLT 323 238 239     Cardiac Enzymes: No results for input(s): CKTOTAL, CKMB, CKMBINDEX, TROPONINI in the last 168 hours.  BNP: Invalid input(s): POCBNP  CBG: No results for input(s):  GLUCAP in the last 168 hours.  Microbiology: Results for orders placed or performed during the hospital encounter of 07/27/21  Culture, blood (Routine x 2)     Status: None (Preliminary result)   Collection Time: 07/27/21  9:36 PM   Specimen: BLOOD  Result Value Ref Range Status   Specimen Description BLOOD BLOOD LEFT FOREARM  Final   Special Requests   Final    BOTTLES DRAWN AEROBIC AND ANAEROBIC Blood Culture results may not be optimal due to an inadequate volume of blood received in culture bottles   Culture   Final    NO GROWTH 4 DAYS Performed at [facility], [address], [place], [place] [address]    Report Status PENDING  Incomplete  Resp Panel by RT-PCR (Flu A&B, Covid) Nasopharyngeal Swab     Status: None   Collection Time: 07/27/21  9:36 PM   Specimen: Nasopharyngeal Swab; Nasopharyngeal(NP) swabs in vial transport medium  Result Value Ref Range Status   SARS Coronavirus 2 by RT PCR NEGATIVE NEGATIVE Final    Comment: (NOTE) SARS-CoV-2 target nucleic acids are NOT DETECTED.  The SARS-CoV-2 RNA is generally  detectable in upper respiratory specimens during the acute phase of infection. The lowest concentration of SARS-CoV-2 viral copies this assay can detect is 138 copies/mL. A negative result does not preclude SARS-Cov-2 infection and should not be used as the sole basis for treatment or other patient management decisions. A negative result may occur with  improper specimen collection/handling, submission of specimen other than nasopharyngeal swab, presence of viral mutation(s) within the areas targeted by this assay, and inadequate number of viral copies(<138 copies/mL). A negative result must be combined with clinical observations, patient history, and epidemiological information. The expected result is Negative.  Fact Sheet for Patients:  EntrepreneurPulse.com.au  Fact Sheet for Healthcare Providers:  IncredibleEmployment.be  This test is no t yet approved or cleared by the [place] FDA and  has been authorized for detection and/or diagnosis of SARS-CoV-2 by FDA under an Emergency Use Authorization (EUA). This EUA will remain  in effect (meaning this test can be used) for the duration of the COVID-19 declaration under Section 564(b)(1) of the Act, 21 U.S.C.section 360bbb-3(b)(1), unless the authorization is terminated  or revoked sooner.       Influenza A by PCR NEGATIVE NEGATIVE Final   Influenza B by PCR NEGATIVE NEGATIVE Final    Comment: (NOTE) The Xpert Xpress SARS-CoV-2/FLU/RSV plus assay is intended as an aid in the diagnosis of influenza from Nasopharyngeal swab specimens and should not be used as a sole basis for treatment. Nasal washings and aspirates are unacceptable for Xpert Xpress SARS-CoV-2/FLU/RSV testing.  Fact Sheet for Patients: EntrepreneurPulse.com.au  Fact Sheet for Healthcare Providers: IncredibleEmployment.be  This test is not yet approved or cleared by the [place] FDA  and has been authorized for detection and/or diagnosis of SARS-CoV-2 by FDA under an Emergency Use Authorization (EUA). This EUA will remain in effect (meaning this test can be used) for the duration of the COVID-19 declaration under Section 564(b)(1) of the Act, 21 U.S.C. section 360bbb-3(b)(1), unless the authorization is terminated or revoked.  Performed at [facility], [address] [place], [place] [address]   Culture, blood (Routine x 2)     Status: None (Preliminary result)   Collection Time: 07/28/21  1:10 AM   Specimen: BLOOD  Result Value Ref Range Status   Specimen Description BLOOD BLOOD RIGHT FOREARM  Final   Special Requests  Final    BOTTLES DRAWN AEROBIC AND ANAEROBIC Blood Culture adequate volume   Culture   Final    NO GROWTH 3 DAYS Performed at [facility], [address] [place], [place] [address]    Report Status PENDING  Incomplete    Coagulation Studies: No results for input(s): LABPROT, INR in the last 72 hours.   Urinalysis: Recent Labs    07/30/21 1101  COLORURINE RED*  LABSPEC 1.018  PHURINE TEST NOT REPORTED DUE TO COLOR INTERFERENCE OF URINE PIGMENT  GLUCOSEU TEST NOT REPORTED DUE TO COLOR INTERFERENCE OF URINE PIGMENT*  HGBUR TEST NOT REPORTED DUE TO COLOR INTERFERENCE OF URINE PIGMENT*  BILIRUBINUR TEST NOT REPORTED DUE TO COLOR INTERFERENCE OF URINE PIGMENT*  KETONESUR TEST NOT REPORTED DUE TO COLOR INTERFERENCE OF URINE PIGMENT*  PROTEINUR TEST NOT REPORTED DUE TO COLOR INTERFERENCE OF URINE PIGMENT*  NITRITE TEST NOT REPORTED DUE TO COLOR INTERFERENCE OF URINE PIGMENT*  LEUKOCYTESUR TEST NOT REPORTED DUE TO COLOR INTERFERENCE OF URINE PIGMENT*       Imaging: ECHOCARDIOGRAM COMPLETE  Result Date: 07/29/2021    ECHOCARDIOGRAM REPORT   Patient Name:   RYKA BEIGHLEY Posadas Date of Exam: 07/29/2021 Medical Rec #:  [id number]           Height:       63.0 in Accession #:    [id number]          Weight:       100.0 lb  Date of Birth:  1922-10-23           BSA:          1.440 m Patient Age:    38 years            BP:           122/64 mmHg Patient Gender: F                   HR:           76 bpm. Exam Location:  [facility] Procedure: 2D Echo, Color Doppler and Cardiac Doppler Indications:     NSTEMI I21.4  History:         Patient has no prior history of Echocardiogram examinations.                  Risk Factors:Hypertension. CKD.  Sonographer:     Sherrie Sport RDCS (AE) Referring Phys:  [id number] Athena Masse Diagnosing Phys: Kate Sable MD  Sonographer Comments: Technically challenging study due to limited acoustic windows. Image acquisition challenging due to patient body habitus and Pt could not lie on left side nor supine. IMPRESSIONS  1. Left ventricular ejection fraction, by estimation, is 60 to 65%. The left ventricle has normal function. The left ventricle has no regional wall motion abnormalities. There is mild left ventricular hypertrophy. Left ventricular diastolic parameters are consistent with Grade II diastolic dysfunction (pseudonormalization).  2. Right ventricular systolic function is normal. The right ventricular size is normal.  3. Left atrial size was moderately dilated.  4. The mitral valve is degenerative. Mild mitral valve regurgitation.  5. The aortic valve is tricuspid. Aortic valve regurgitation is not visualized. Mild to moderate aortic valve sclerosis/calcification is present, without any evidence of aortic stenosis. FINDINGS  Left Ventricle: Left ventricular ejection fraction, by estimation, is 60 to 65%. The left ventricle has normal function. The left ventricle has no regional wall motion abnormalities. The left ventricular internal cavity size was normal in size. There  is  mild left ventricular hypertrophy. Left ventricular diastolic parameters are consistent with Grade II diastolic dysfunction (pseudonormalization). Right Ventricle: The right ventricular size is normal. No increase in right  ventricular wall thickness. Right ventricular systolic function is normal. Left Atrium: Left atrial size was moderately dilated. Right Atrium: Right atrial size was normal in size. Pericardium: There is no evidence of pericardial effusion. Mitral Valve: The mitral valve is degenerative in appearance. There is moderate calcification of the mitral valve leaflet(s). Mild mitral valve regurgitation. Tricuspid Valve: The tricuspid valve is not well visualized. Tricuspid valve regurgitation is mild. Aortic Valve: The aortic valve is tricuspid. Aortic valve regurgitation is not visualized. Mild to moderate aortic valve sclerosis/calcification is present, without any evidence of aortic stenosis. Aortic valve mean gradient measures 4.0 mmHg. Aortic valve peak gradient measures 8.5 mmHg. Aortic valve area, by VTI measures 2.16 cm. Pulmonic Valve: The pulmonic valve was not well visualized. Pulmonic valve regurgitation is not visualized. Aorta: The aortic root is normal in size and structure. Venous: The inferior vena cava was not well visualized. IAS/Shunts: There is right bowing of the interatrial septum, suggestive of elevated left atrial pressure. No atrial level shunt detected by color flow Doppler.  LEFT VENTRICLE PLAX 2D LVIDd:         2.92 cm  Diastology LVIDs:         1.84 cm  LV e' medial:    2.61 cm/s LV PW:         1.34 cm  LV E/e' medial:  17.4 LV IVS:        1.50 cm  LV e' lateral:   5.33 cm/s LVOT diam:     2.00 cm  LV E/e' lateral: 8.5 LV SV:         55 LV SV Index:   38 LVOT Area:     3.14 cm  LEFT ATRIUM             Index       RIGHT ATRIUM           Index LA diam:        3.90 cm 2.71 cm/m  RA Area:     10.50 cm LA Vol (A2C):   39.4 ml 27.36 ml/m RA Volume:   19.70 ml  13.68 ml/m LA Vol (A4C):   52.2 ml 36.24 ml/m LA Biplane Vol: 47.1 ml 32.70 ml/m  AORTIC VALVE                   PULMONIC VALVE AV Area (Vmax):    1.98 cm    PV Vmax:        0.51 m/s AV Area (Vmean):   2.19 cm    PV Peak grad:   1.0  mmHg AV Area (VTI):     2.16 cm    RVOT Peak grad: 1 mmHg AV Vmax:           146.00 cm/s AV Vmean:          94.400 cm/s AV VTI:            0.253 m AV Peak Grad:      8.5 mmHg AV Mean Grad:      4.0 mmHg LVOT Vmax:         92.00 cm/s LVOT Vmean:        65.800 cm/s LVOT VTI:          0.174 m LVOT/AV VTI ratio: 0.69  AORTA Ao Root diam: 3.20  cm MITRAL VALVE MV Area (PHT): 4.41 cm    SHUNTS MV Decel Time: 172 msec    Systemic VTI:  0.17 m MV E velocity: 45.40 cm/s  Systemic Diam: 2.00 cm MV A velocity: 39.80 cm/s MV E/A ratio:  1.14 Kate Sable MD Electronically signed by Kate Sable MD Signature Date/Time: 07/29/2021/3:00:57 PM    Final      Medications:    sodium chloride 75 mL/hr at 07/31/21 0816    aspirin EC  81 mg Oral Daily   atorvastatin  20 mg Oral Daily   Chlorhexidine Gluconate Cloth  6 each Topical Daily   COVID-19 mRNA vaccine (Moderna)  0.25 mL Intramuscular ONCE-1600   feeding supplement  237 mL Oral BID BM   metoprolol tartrate  25 mg Oral BID   multivitamin with minerals  1 tablet Oral Daily   QUEtiapine  25 mg Oral QHS   sodium chloride  1 g Oral BID WC   acetaminophen **OR** acetaminophen, haloperidol lactate, ondansetron **OR** ondansetron (ZOFRAN) IV  Assessment/ Plan:  Ms. Lilliana Turner is a 87 y.o.  female presenting to the ED with weakness, shortness of breath and nausea. She has been admitted for Dehydration [E86.0] Hyponatremia [E87.1] Generalized weakness [R53.1] AKI (acute kidney injury) ([place]) [N17.9] Sepsis ([place]) [A41.9]   #Acute hyponatremia Likely secondary to "tea and toast diet".  Patient's appetite has been poor as outpatient but she has been drinking increased amounts of plain water.  This may have contributed to Developing hyponatremia over time.  Review of outpatient records show that her sodium level was normal in May 2022.  Mildly decreased sodium of 132 noted in July, further decrease noted on July 27 with sodium level of 127.  TSH  normal.  CT with IV contrast without any evidence of lung mass.   3% (hypertonic) saline discontinued 07/29/21. Sodium decreased to  128.  Continue sodium chloride tabs. Continue to monitor neuro status Continues poor appetite, Ensure ordered BID. Excessive hydration began with reports of having hyperkalemia. Encouraged son that this can be managed with diet and occurred based on a dietary change that took place this summer. Son and patient agree that patient was drinking too much orange juice during the summer and that caused her potassium to go up. So she is encouraged to limit the excessive juice and other high protein foods and drinks. Sodium tabs will likely continue for a few weeks until levels are stable.     #Acute kidney injury Outpatient creatinine has been higher Unclear cause but ? Related to losartan. Currently on hold.  Creatinine increased to 1.40 Will follow  #Hematuria Patient may have pulled it while confused      LOS: 4   8/11/202210:25 AM

## 2021-07-31 NOTE — Progress Notes (Signed)
Occupational Therapy Treatment Patient Details Name: Autumn Johnston MRN: [id number] DOB: [birth year] Today's Date: 07/31/2021    History of present illness Pt is a 87 y.o. female presenting to hospital 8/7 with generalized weakness and AMS; decreased oral intake.  Pt admitted with acute hyponatremia, acute metabolic encephalopathy, elevated troponin (likely supply demand ischemia), and hypertensive urgency.  PMH includes htn, HLD, anemia, acute on chronic renal failure, colon CA s/p colectomy with colostomy, and chronic insomnia.   OT comments  Autumn Johnston was seen for OT treatment on this date. Upon arrival to room pt reclined in bed and immediately engages in conversation stating her need to be d/c bc she has chores at home to go do and that there is nothing wrong with her. Pt redirected to engage in mobility and demonstrates improvement from prior date - requires MIN A + RW sit<>stand at EOB and for SPT bed>chair, improving to CGA standing from chair height x3 trials. Pt requires MIN A for poor eccentric control. Pt assisted to call daughter, left in chair with chair alarm set RN aware. Pt making good progress toward goals. Pt continues to benefit from skilled OT services to maximize return to PLOF. Will continue to follow POC. Discharge recommendation remains appropriate.    Follow Up Recommendations  SNF    Equipment Recommendations  3 in 1 bedside commode    Recommendations for Other Services      Precautions / Restrictions Precautions Precautions: Fall Restrictions Weight Bearing Restrictions: No       Mobility Bed Mobility Overal bed mobility: Needs Assistance Bed Mobility: Supine to Sit     Supine to sit: Supervision          Transfers Overall transfer level: Needs assistance Equipment used: Rolling walker (2 wheeled) Transfers: Sit to/from [facility] Sit to Stand: Min assist Stand pivot transfers: Min assist       General transfer  comment: Initial MIN A standing from bed inproving to CGA stading from chair height x3 trials - MIN A for poor eccentric control    Balance Overall balance assessment: Needs assistance Sitting-balance support: No upper extremity supported;Feet supported Sitting balance-Leahy Scale: Good     Standing balance support: Bilateral upper extremity supported Standing balance-Leahy Scale: Poor Standing balance comment: pt with posterior lean in standing requiring assist for upright balance                           ADL either performed or assessed with clinical judgement   ADL Overall ADL's : Needs assistance/impaired                                       General ADL Comments: MAX A don B socks at bed level. MIN A + RW for ADL t/f. Pt requires cues for redirection t/o session as pt is adamant about going home today      Cognition Arousal/Alertness: Awake/alert Behavior During Therapy: Agitated Overall Cognitive Status: Impaired/Different from baseline Area of Impairment: Memory;Following commands;Problem solving                     Memory: Decreased recall of precautions;Decreased short-term memory Following Commands: Follows one step commands inconsistently;Follows one step commands with increased time     Problem Solving: Difficulty sequencing;Decreased initiation;Slow processing;Requires verbal cues;Requires tactile cues General Comments: Oriented to person, place, and time  but not situation.        Exercises Exercises: Other exercises Other Exercises Other Exercises: Pt  re; DME recs, d/c recs, falls prevention, reorietnted to situation Other Exercises: LBD, sup>sit, sit<>stand x2, sitting/standing balance/tolerance           Pertinent Vitals/ Pain       Pain Assessment: No/denies pain   Frequency  Min 2X/week        Progress Toward Goals  OT Goals(current goals can now be found in the care plan section)  Progress towards OT  goals: Progressing toward goals  Acute Rehab OT Goals Patient Stated Goal: to improve walking OT Goal Formulation: With patient/family Time For Goal Achievement: 08/13/21 Potential to Achieve Goals: Good ADL Goals Pt Will Perform Grooming: sitting;with modified independence Pt Will Perform Lower Body Dressing: with set-up;with supervision;sitting/lateral leans Pt Will Transfer to Toilet: with min assist;stand pivot transfer;bedside commode  Plan Discharge plan remains appropriate;Frequency remains appropriate    Co-evaluation                 AM-PAC OT "6 Clicks" Daily Activity     Outcome Measure   Help from another person eating meals?: None Help from another person taking care of personal grooming?: A Little Help from another person toileting, which includes using toliet, bedpan, or urinal?: A Lot Help from another person bathing (including washing, rinsing, drying)?: A Lot Help from another person to put on and taking off regular upper body clothing?: A Little Help from another person to put on and taking off regular lower body clothing?: A Lot 6 Click Score: 16    End of Session Equipment Utilized During Treatment: Rolling walker  OT Visit Diagnosis: Other abnormalities of gait and mobility (R26.89)   Activity Tolerance Patient tolerated treatment well   Patient Left in chair;with call bell/phone within reach;with chair alarm set (lap belt alarm)   Nurse Communication Other (comment) (in chair with alarm)        Time: [phone] OT Time Calculation (min): 16 min  Charges: OT General Charges $OT Visit: 1 Visit OT Treatments $Self Care/Home Management : 8-22 mins  Dessie Coma, M.S. OTR/L  07/31/21, 10:53 AM  ascom [phone]

## 2021-07-31 NOTE — TOC Progression Note (Signed)
Transition of Care [facility]) - Progression Note    Patient Details  Name: Autumn Johnston MRN: [id number] Date of Birth: 08/03/1922  [facility] CM/SW Contact  Shelbie Hutching, RN Phone Number: 07/31/2021, 9:35 AM  Clinical Narrative:    [facility] has offered a bed and bed offer accepted per family choice.  [facility] can accept patient today if medically stable.    Expected Discharge Plan: Skilled Nursing Facility Barriers to Discharge: SNF Pending bed offer  Expected Discharge Plan and Services Expected Discharge Plan: [place]   Discharge Planning Services: CM Consult Post Acute Care Choice: [place] Living arrangements for the past 2 months: Single Family Home                 DME Arranged: N/A DME Agency: NA       HH Arranged: NA HH Agency: NA         Social Determinants of Health (SDOH) Interventions    Readmission Risk Interventions No flowsheet data found.

## 2021-08-01 LAB — RESP PANEL BY RT-PCR (FLU A&B, COVID) ARPGX2
Influenza A by PCR: NEGATIVE
Influenza B by PCR: NEGATIVE
SARS Coronavirus 2 by RT PCR: NEGATIVE

## 2021-08-01 LAB — CBC WITH DIFFERENTIAL/PLATELET
Abs Immature Granulocytes: 0.04 10*3/uL (ref 0.00–0.07)
Basophils Absolute: 0 10*3/uL (ref 0.0–0.1)
Basophils Relative: 0 %
Eosinophils Absolute: 0.2 10*3/uL (ref 0.0–0.5)
Eosinophils Relative: 2 %
HCT: 28.7 % — ABNORMAL LOW (ref 36.0–46.0)
Hemoglobin: 9.9 g/dL — ABNORMAL LOW (ref 12.0–15.0)
Immature Granulocytes: 1 %
Lymphocytes Relative: 22 %
Lymphs Abs: 1.8 10*3/uL (ref 0.7–4.0)
MCH: 30.5 pg (ref 26.0–34.0)
MCHC: 34.5 g/dL (ref 30.0–36.0)
MCV: 88.3 fL (ref 80.0–100.0)
Monocytes Absolute: 0.8 10*3/uL (ref 0.1–1.0)
Monocytes Relative: 10 %
Neutro Abs: 5.3 10*3/uL (ref 1.7–7.7)
Neutrophils Relative %: 65 %
Platelets: 210 10*3/uL (ref 150–400)
RBC: 3.25 MIL/uL — ABNORMAL LOW (ref 3.87–5.11)
RDW: 13.8 % (ref 11.5–15.5)
WBC: 8.1 10*3/uL (ref 4.0–10.5)
nRBC: 0 % (ref 0.0–0.2)

## 2021-08-01 LAB — CULTURE, BLOOD (ROUTINE X 2): Culture: NO GROWTH

## 2021-08-01 LAB — BASIC METABOLIC PANEL
Anion gap: 6 (ref 5–15)
BUN: 43 mg/dL — ABNORMAL HIGH (ref 8–23)
CO2: 25 mmol/L (ref 22–32)
Calcium: 8.9 mg/dL (ref 8.9–10.3)
Chloride: 104 mmol/L (ref 98–111)
Creatinine, Ser: 1.28 mg/dL — ABNORMAL HIGH (ref 0.44–1.00)
GFR, Estimated: 38 mL/min — ABNORMAL LOW (ref >60)
Glucose, Bld: 99 mg/dL (ref 70–99)
Potassium: 4.7 mmol/L (ref 3.5–5.1)
Sodium: 135 mmol/L (ref 135–145)

## 2021-08-01 LAB — URINE CULTURE: Culture: >=100000 — AB

## 2021-08-01 LAB — MAGNESIUM: Magnesium: 2 mg/dL (ref 1.7–2.4)

## 2021-08-01 MED ORDER — SODIUM CHLORIDE 0.9 % IV BOLUS
250.0000 mL | Freq: Once | INTRAVENOUS | Status: AC
  Administered 2021-08-01: 01:00:00 250 mL via INTRAVENOUS

## 2021-08-01 MED ORDER — ASPIRIN 81 MG PO TBEC: 81.0000 mg | DELAYED_RELEASE_TABLET | Freq: Every day | ORAL | 11 refills | Status: DC

## 2021-08-01 MED ORDER — METOPROLOL TARTRATE 25 MG PO TABS
25.0000 mg | ORAL_TABLET | Freq: Two times a day (BID) | ORAL | Status: DC
Start: 2021-08-01 — End: 2021-09-18

## 2021-08-01 MED ORDER — QUETIAPINE FUMARATE 25 MG PO TABS: 25.0000 mg | ORAL_TABLET | Freq: Every day | ORAL | Status: DC

## 2021-08-01 MED ORDER — SODIUM CHLORIDE 1 G PO TABS: 1.0000 g | ORAL_TABLET | Freq: Two times a day (BID) | ORAL | 0 refills | Status: AC

## 2021-08-01 MED ORDER — ALPRAZOLAM 0.25 MG PO TABS: 0.2500 mg | ORAL_TABLET | Freq: Two times a day (BID) | ORAL | 0 refills | Status: DC | PRN

## 2021-08-01 MED ORDER — ENSURE ENLIVE PO LIQD: 237.0000 mL | Freq: Three times a day (TID) | ORAL | 12 refills | Status: DC

## 2021-08-01 NOTE — Progress Notes (Signed)
Called report to kim at [facility] pt currently waiting on EMS

## 2021-08-01 NOTE — Progress Notes (Signed)
   08/01/21 0118  Vitals  Temp 98.7 F (37.1 C)  BP (!) 84/40  MAP (mmHg) (!) 53  BP Location Right Arm  BP Method Automatic  Patient Position (if appropriate) Lying  Pulse Rate (!) 56  Resp 18  Level of Consciousness  Level of Consciousness Alert  MEWS COLOR  MEWS Score Color Green  Oxygen Therapy  SpO2 99 %  O2 Device Room Air   Patient blood pressure and heart rate low. Physician notified. 250 NS bolus ordered and given. Blood pressure and heart rate checked an hour later. Blood pressure 101/61 and HR was 63.

## 2021-08-01 NOTE — Progress Notes (Signed)
[facility]  ROUNDING NOTE   Subjective:   Autumn Johnston is a 87 y.o. female presenting to the ED with weakness, shortness of breath and nausea. She has been admitted for Dehydration [E86.0] Hyponatremia [E87.1] Generalized weakness [R53.1] AKI (acute kidney injury) ([place]) [N17.9] Sepsis ([place]) [A41.9]   Patient seen sitting in chair Alert  Ate small amount of breakfast Upset that she wasn't allowed to discharge.  Objective:  Vital signs in last 24 hours:  Temp:  [97.8 F (36.6 C)-98.9 F (37.2 C)] 98.4 F (36.9 C) (08/12 1213) Pulse Rate:  [56-72] 67 (08/12 1213) Resp:  [16-18] 16 (08/12 1213) BP: (84-149)/(40-61) 146/52 (08/12 1213) SpO2:  [98 %-100 %] 100 % (08/12 1213) Weight:  [42.7 kg] 42.7 kg (08/11 1542)  Weight change:  Filed Weights   07/27/21 2315 07/31/21 1542  Weight: 45.4 kg 42.7 kg    Intake/Output: I/O last 3 completed shifts: In: 1639.3 [P.O.:120; I.V.:1269.3; IV Piggyback:250] Out: 3532 [Urine:1650]   Intake/Output this shift:  Total I/O In: -  Out: 20 [Stool:20]  Physical Exam: General: NAD, sitting in chair  Head: Normocephalic, atraumatic. Moist oral mucosal membranes  Eyes: Anicteric  Lungs:  Clear to auscultation, normal effort  Heart: Regular rate and rhythm  Abdomen:  Soft, nontender  Extremities:  no peripheral edema.  Neurologic: Alert to person and place, moving all four extremities  Skin: No lesions       Basic Metabolic Panel: Recent Labs  Lab 07/28/21 0653 07/28/21 0955 07/29/21 0515 07/29/21 1558 07/29/21 2338 07/30/21 0614 07/31/21 0529 07/31/21 1214 08/01/21 0659  NA 116*   < > 124*   < > 130* 131* 128* 131* 135  K 3.9  --  3.8  --   --  3.6 4.0  --  4.7  CL 83*  --  95*  --   --  100 97*  --  104  CO2 21*  --  21*  --   --  20* 26  --  25  GLUCOSE 115*  --  76  --   --  94 90  --  99  BUN 24*  --  22  --   --  31* 41*  --  43*  CREATININE 1.01*  --  1.16*  --   --  1.28* 1.40*  --  1.28*   CALCIUM 8.5*  --  7.9*  --   --  8.4* 8.6*  --  8.9  MG  --   --  1.6*  --   --  2.4  --   --  2.0   < > = values in this interval not displayed.     Liver Function Tests: Recent Labs  Lab 07/27/21 2136  AST 43*  ALT 23  ALKPHOS 53  BILITOT 1.5*  PROT 7.9  ALBUMIN 4.6    Recent Labs  Lab 07/27/21 2136  LIPASE 41    No results for input(s): AMMONIA in the last 168 hours.  CBC: Recent Labs  Lab 07/27/21 2136 07/29/21 0515 07/30/21 0614 08/01/21 0659  WBC 8.0 6.5 7.0 8.1  NEUTROABS 6.9 4.5  --  5.3  HGB 13.8 10.3* 9.9* 9.9*  HCT 37.5 29.2* 28.6* 28.7*  MCV 83.1 86.4 85.6 88.3  PLT 323 238 239 210     Cardiac Enzymes: No results for input(s): CKTOTAL, CKMB, CKMBINDEX, TROPONINI in the last 168 hours.  BNP: Invalid input(s): POCBNP  CBG: No results for input(s): GLUCAP in the last 168  hours.  Microbiology: Results for orders placed or performed during the hospital encounter of 07/27/21  Culture, blood (Routine x 2)     Status: None   Collection Time: 07/27/21  9:36 PM   Specimen: BLOOD  Result Value Ref Range Status   Specimen Description BLOOD BLOOD LEFT FOREARM  Final   Special Requests   Final    BOTTLES DRAWN AEROBIC AND ANAEROBIC Blood Culture results may not be optimal due to an inadequate volume of blood received in culture bottles   Culture   Final    NO GROWTH 5 DAYS Performed at [facility], [address] [place], [place] [address]    Report Status 08/01/2021 FINAL  Final  Resp Panel by RT-PCR (Flu A&B, Covid) Nasopharyngeal Swab     Status: None   Collection Time: 07/27/21  9:36 PM   Specimen: Nasopharyngeal Swab; Nasopharyngeal(NP) swabs in vial transport medium  Result Value Ref Range Status   SARS Coronavirus 2 by RT PCR NEGATIVE NEGATIVE Final    Comment: (NOTE) SARS-CoV-2 target nucleic acids are NOT DETECTED.  The SARS-CoV-2 RNA is generally detectable in upper respiratory specimens during the acute phase of  infection. The lowest concentration of SARS-CoV-2 viral copies this assay can detect is 138 copies/mL. A negative result does not preclude SARS-Cov-2 infection and should not be used as the sole basis for treatment or other patient management decisions. A negative result may occur with  improper specimen collection/handling, submission of specimen other than nasopharyngeal swab, presence of viral mutation(s) within the areas targeted by this assay, and inadequate number of viral copies(<138 copies/mL). A negative result must be combined with clinical observations, patient history, and epidemiological information. The expected result is Negative.  Fact Sheet for Patients:  EntrepreneurPulse.com.au  Fact Sheet for Healthcare Providers:  IncredibleEmployment.be  This test is no t yet approved or cleared by the [place] FDA and  has been authorized for detection and/or diagnosis of SARS-CoV-2 by FDA under an Emergency Use Authorization (EUA). This EUA will remain  in effect (meaning this test can be used) for the duration of the COVID-19 declaration under Section 564(b)(1) of the Act, 21 U.S.C.section 360bbb-3(b)(1), unless the authorization is terminated  or revoked sooner.       Influenza A by PCR NEGATIVE NEGATIVE Final   Influenza B by PCR NEGATIVE NEGATIVE Final    Comment: (NOTE) The Xpert Xpress SARS-CoV-2/FLU/RSV plus assay is intended as an aid in the diagnosis of influenza from Nasopharyngeal swab specimens and should not be used as a sole basis for treatment. Nasal washings and aspirates are unacceptable for Xpert Xpress SARS-CoV-2/FLU/RSV testing.  Fact Sheet for Patients: EntrepreneurPulse.com.au  Fact Sheet for Healthcare Providers: IncredibleEmployment.be  This test is not yet approved or cleared by the [place] FDA and has been authorized for detection and/or diagnosis of SARS-CoV-2  by FDA under an Emergency Use Authorization (EUA). This EUA will remain in effect (meaning this test can be used) for the duration of the COVID-19 declaration under Section 564(b)(1) of the Act, 21 U.S.C. section 360bbb-3(b)(1), unless the authorization is terminated or revoked.  Performed at [facility], [address] [place], [place] [address]   Culture, blood (Routine x 2)     Status: None (Preliminary result)   Collection Time: 07/28/21  1:10 AM   Specimen: BLOOD  Result Value Ref Range Status   Specimen Description BLOOD BLOOD RIGHT FOREARM  Final   Special Requests   Final  BOTTLES DRAWN AEROBIC AND ANAEROBIC Blood Culture adequate volume   Culture   Final    NO GROWTH 4 DAYS Performed at [facility], [address] [place], [place] [address]    Report Status PENDING  Incomplete  Urine Culture     Status: Abnormal   Collection Time: 07/30/21 11:01 AM   Specimen: Urine, Catheterized  Result Value Ref Range Status   Specimen Description   Final    URINE, CATHETERIZED Performed at [facility], [address], [place], [place] [address]    Special Requests   Final    NONE Performed at [facility], [address] [place], [place] [address]    Culture (A)  Final    >=100,000 COLONIES/mL ESCHERICHIA COLI 30,000 COLONIES/mL PROTEUS MIRABILIS    Report Status 08/01/2021 FINAL  Final   Organism ID, Bacteria ESCHERICHIA COLI (A)  Final   Organism ID, Bacteria PROTEUS MIRABILIS (A)  Final      Susceptibility   Escherichia coli - MIC*    AMPICILLIN 4 SENSITIVE Sensitive     CEFAZOLIN <=4 SENSITIVE Sensitive     CEFEPIME <=0.12 SENSITIVE Sensitive     CEFTRIAXONE <=0.25 SENSITIVE Sensitive     CIPROFLOXACIN <=0.25 SENSITIVE Sensitive     GENTAMICIN <=1 SENSITIVE Sensitive     IMIPENEM <=0.25 SENSITIVE Sensitive     NITROFURANTOIN <=16 SENSITIVE Sensitive     TRIMETH/SULFA <=20 SENSITIVE Sensitive     AMPICILLIN/SULBACTAM  <=2 SENSITIVE Sensitive     PIP/TAZO <=4 SENSITIVE Sensitive     * >=100,000 COLONIES/mL ESCHERICHIA COLI   Proteus mirabilis - MIC*    AMPICILLIN <=2 SENSITIVE Sensitive     CEFAZOLIN <=4 SENSITIVE Sensitive     CEFEPIME <=0.12 SENSITIVE Sensitive     CEFTRIAXONE <=0.25 SENSITIVE Sensitive     CIPROFLOXACIN <=0.25 SENSITIVE Sensitive     GENTAMICIN <=1 SENSITIVE Sensitive     IMIPENEM 2 SENSITIVE Sensitive     NITROFURANTOIN 128 RESISTANT Resistant     TRIMETH/SULFA <=20 SENSITIVE Sensitive     AMPICILLIN/SULBACTAM <=2 SENSITIVE Sensitive     PIP/TAZO <=4 SENSITIVE Sensitive     * 30,000 COLONIES/mL PROTEUS MIRABILIS  Resp Panel by RT-PCR (Flu A&B, Covid) Nasopharyngeal Swab     Status: None   Collection Time: 08/01/21  9:18 AM   Specimen: Nasopharyngeal Swab; Nasopharyngeal(NP) swabs in vial transport medium  Result Value Ref Range Status   SARS Coronavirus 2 by RT PCR NEGATIVE NEGATIVE Final    Comment: (NOTE) SARS-CoV-2 target nucleic acids are NOT DETECTED.  The SARS-CoV-2 RNA is generally detectable in upper respiratory specimens during the acute phase of infection. The lowest concentration of SARS-CoV-2 viral copies this assay can detect is 138 copies/mL. A negative result does not preclude SARS-Cov-2 infection and should not be used as the sole basis for treatment or other patient management decisions. A negative result may occur with  improper specimen collection/handling, submission of specimen other than nasopharyngeal swab, presence of viral mutation(s) within the areas targeted by this assay, and inadequate number of viral copies(<138 copies/mL). A negative result must be combined with clinical observations, patient history, and epidemiological information. The expected result is Negative.  Fact Sheet for Patients:  EntrepreneurPulse.com.au  Fact Sheet for Healthcare Providers:  IncredibleEmployment.be  This test is no t yet  approved or cleared by the [place] FDA and  has been authorized for detection and/or diagnosis of SARS-CoV-2 by FDA under an Emergency Use Authorization (EUA). This EUA  will remain  in effect (meaning this test can be used) for the duration of the COVID-19 declaration under Section 564(b)(1) of the Act, 21 U.S.C.section 360bbb-3(b)(1), unless the authorization is terminated  or revoked sooner.       Influenza A by PCR NEGATIVE NEGATIVE Final   Influenza B by PCR NEGATIVE NEGATIVE Final    Comment: (NOTE) The Xpert Xpress SARS-CoV-2/FLU/RSV plus assay is intended as an aid in the diagnosis of influenza from Nasopharyngeal swab specimens and should not be used as a sole basis for treatment. Nasal washings and aspirates are unacceptable for Xpert Xpress SARS-CoV-2/FLU/RSV testing.  Fact Sheet for Patients: EntrepreneurPulse.com.au  Fact Sheet for Healthcare Providers: IncredibleEmployment.be  This test is not yet approved or cleared by the [place] FDA and has been authorized for detection and/or diagnosis of SARS-CoV-2 by FDA under an Emergency Use Authorization (EUA). This EUA will remain in effect (meaning this test can be used) for the duration of the COVID-19 declaration under Section 564(b)(1) of the Act, 21 U.S.C. section 360bbb-3(b)(1), unless the authorization is terminated or revoked.  Performed at [facility], [address] [place], [place] [address]     Coagulation Studies: No results for input(s): LABPROT, INR in the last 72 hours.   Urinalysis: Recent Labs    07/30/21 1101  COLORURINE RED*  LABSPEC 1.018  PHURINE TEST NOT REPORTED DUE TO COLOR INTERFERENCE OF URINE PIGMENT  GLUCOSEU TEST NOT REPORTED DUE TO COLOR INTERFERENCE OF URINE PIGMENT*  HGBUR TEST NOT REPORTED DUE TO COLOR INTERFERENCE OF URINE PIGMENT*  BILIRUBINUR TEST NOT REPORTED DUE TO COLOR INTERFERENCE OF URINE PIGMENT*  KETONESUR  TEST NOT REPORTED DUE TO COLOR INTERFERENCE OF URINE PIGMENT*  PROTEINUR TEST NOT REPORTED DUE TO COLOR INTERFERENCE OF URINE PIGMENT*  NITRITE TEST NOT REPORTED DUE TO COLOR INTERFERENCE OF URINE PIGMENT*  LEUKOCYTESUR TEST NOT REPORTED DUE TO COLOR INTERFERENCE OF URINE PIGMENT*       Imaging: No results found.   Medications:      aspirin EC  81 mg Oral Daily   atorvastatin  20 mg Oral Daily   Chlorhexidine Gluconate Cloth  6 each Topical Daily   feeding supplement  237 mL Oral TID BM   metoprolol tartrate  25 mg Oral BID   multivitamin with minerals  1 tablet Oral Daily   QUEtiapine  25 mg Oral QHS   sodium chloride  1 g Oral BID WC   acetaminophen **OR** acetaminophen, haloperidol lactate, LORazepam, ondansetron **OR** ondansetron (ZOFRAN) IV  Assessment/ Plan:  Ms. Terrace Chiem is a 87 y.o.  female presenting to the ED with weakness, shortness of breath and nausea. She has been admitted for Dehydration [E86.0] Hyponatremia [E87.1] Generalized weakness [R53.1] AKI (acute kidney injury) ([place]) [N17.9] Sepsis ([place]) [A41.9]   #Acute hyponatremia Likely secondary to "tea and toast diet".  Patient's appetite has been poor as outpatient but she has been drinking increased amounts of plain water.  This may have contributed to Developing hyponatremia over time.  Review of outpatient records show that her sodium level was normal in May 2022.  Mildly decreased sodium of 132 noted in July, further decrease noted on July 27 with sodium level of 127.  TSH normal.  CT with IV contrast without any evidence of lung mass.   3% (hypertonic) saline discontinued 07/29/21. Sodium decreased to  128.  Continue sodium chloride tabs. Continue to monitor neuro status Continues poor appetite, Ensure ordered BID. Excessive hydration began with reports of  having hyperkalemia. So she is encouraged to limit the excessive juice and other high protein foods and drinks. Sodium tabs will likely continue  for a few weeks until levels are stable.     #Acute kidney injury Outpatient creatinine has been higher Unclear cause but ? Related to losartan. Currently on hold.  Creatinine decreased to 1.28 Will follow  #Hematuria Patient may have pulled it while confused  Urine clear    LOS: 5 [place] 8/12/20221:01 PM

## 2021-08-01 NOTE — TOC Transition Note (Signed)
Transition of Care [facility]) - CM/SW Discharge Note   Patient Details  Name: Autumn Johnston MRN: [id number] Date of Birth: 13-May-1922  Transition of Care [facility] CM/SW Contact:  Shelbie Hutching, RN Phone Number: 08/01/2021, 12:13 PM   Clinical Narrative:    Patient medically cleared for discharge to [facility] today.  Patient will be going to room 607A.  Bedside RN calling report now.  RNCM has arranged EMS transport, she is 3rd on the list for pick up.  Patrici Ranks, daughter, notified of discharge.  Patient received booster COVID vaccination yesterday and covid test negative.     Final next level of care: Skilled Nursing Facility Barriers to Discharge: Barriers Resolved   Patient Goals and CMS Choice Patient states their goals for this hospitalization and ongoing recovery are:: patient unable to state goals but family wants [facility] Medicare.gov Compare Post Acute Care list provided to:: Patient Represenative (must comment) Choice offered to / list presented to : Adult Children  Discharge Placement   Existing PASRR number confirmed : 07/30/21          Patient chooses bed at: [facility] [place] Patient to be transferred to facility by: [facility] Name of family member notified: Patrici Ranks Patient and family notified of of transfer: 08/01/21  Discharge Plan and Services   Discharge Planning Services: CM Consult Post Acute Care Choice: [place]          DME Arranged: N/A DME Agency: NA       HH Arranged: NA HH Agency: NA        Social Determinants of Health (SDOH) Interventions     Readmission Risk Interventions No flowsheet data found.

## 2021-08-01 NOTE — Discharge Summary (Addendum)
Physician Discharge Summary  Patient ID: Autumn Johnston MRN: [id number] DOB/AGE: [birth year] 85 y.o.  Admit date: 07/27/2021 Discharge date: 08/01/2021  Admission Diagnoses:  Discharge Diagnoses:  Principal Problem:   Acute hyponatremia Active Problems:   Chronic kidney disease (CKD), stage IV (severe) (HCC)   Elevated troponin   Acute metabolic encephalopathy   Hypertensive urgency   Protein calorie malnutrition (HCC)   Hyponatremia   Nonsustained ventricular tachycardia (HCC)   Protein-calorie malnutrition, severe   Discharged Condition: fair  Hospital Course:  Autumn Johnston is a 86 y.o. female with medical history significant for HTN, CKD4, colon cancer s/p colectomy with colostomy who lives at home with her husband and is independent and takes care of all her financial affairs herself who was brought into the ED with a concern for weakness and altered mental status.  Patient had hypocalcemia with a potassium of 6.4 in May, since that time, he was advised to drink excessive amount of fluids.  She has been drinking large amount of water on a daily basis.  Upon arriving the emergency room, she was found to have altered mental status with potassium 113.  He was given 3% sodium chloride.  Consult from nephrology is also obtained. 8/11.  Patient developed hematuria, urine culture grew gram-negative rods, will give a dose of fosfomycin.  Added sodium tablet for hyponatremia.    #1. severe hyponatremia Acute metabolic encephalopathy secondary to hyponatremia. Patient condition much improved, sodium 135, which is normal.  I will continue salt tablets, continue fluid restriction up to 1700 mL/day.  Please repeat a BMP within 1 week time. Patient is also given Seroquel every evening for poor sleep.  She no longer has any confusion.  She is medically stable to be discharged.   #2.  E coli urinary tract infection with hematuria. Urinary retention.  Patient does not have sepsis.   Sepsis ruled out. Patient was given a dose of fosfomycin, Foley catheter was anchored. Foley catheter was removed at 6 AM this morning, we will scan her bladder make sure she can urinate before discharge.   #3.  Elevated troponin secondary to demand ischemia. Patient received a 2-day of IV heparin drip.  She may need to follow-up with cardiology as outpatient.  4.  Chronic kidney disease stage IV. Anemia of chronic disease. Slightly worsening renal function due to dehydration, renal function back to baseline.  5.  Severe protein calorie malnutrition. continue supplement  6.  Nonsustained mention tachycardia  Patient had one episode of atrial tachycardia, no recurrence. Patient will be followed by cardiology as outpatient.    Consults: nephrology  Significant Diagnostic Studies:   Treatments: 3% saline   Discharge Exam: Blood pressure (!) 149/56, pulse 68, temperature 98.4 F (36.9 C), resp. rate 16, height 5\' 3"  (1.6 m), weight 42.7 kg, SpO2 99 %. General appearance: alert and cooperative Resp: clear to auscultation bilaterally Cardio: regular rate and rhythm, S1, S2 normal, no murmur, click, rub or gallop GI: soft, non-tender; bowel sounds normal; no masses,  no organomegaly Extremities: extremities normal, atraumatic, no cyanosis or edema  Disposition: Discharge disposition: 03-Skilled Nursing Facility       Discharge Instructions     Diet general   Complete by: As directed    No restriction on salt, fluid <1730ml/day. Regular diet   Increase activity slowly   Complete by: As directed       Allergies as of 08/01/2021   No Known Allergies      Medication List  STOP taking these medications    amLODipine 5 MG tablet Commonly known as: NORVASC   losartan 100 MG tablet Commonly known as: COZAAR   zolpidem 10 MG tablet Commonly known as: AMBIEN       TAKE these medications    ALPRAZolam 0.25 MG tablet Commonly known as: XANAX Take 1 tablet  (0.25 mg total) by mouth 2 (two) times daily as needed.   aspirin 81 MG EC tablet Take 1 tablet (81 mg total) by mouth daily. Swallow whole. Start taking on: August 02, 2021   feeding supplement Liqd Take 237 mLs by mouth 3 (three) times daily between meals.   metoprolol tartrate 25 MG tablet Commonly known as: LOPRESSOR Take 1 tablet (25 mg total) by mouth 2 (two) times daily.   QUEtiapine 25 MG tablet Commonly known as: SEROQUEL Take 1 tablet (25 mg total) by mouth at bedtime.   sertraline 50 MG tablet Commonly known as: ZOLOFT Take 50 mg by mouth daily.   simvastatin 20 MG tablet Commonly known as: ZOCOR Take 20 mg by mouth at bedtime.   sodium chloride 1 g tablet Take 1 tablet (1 g total) by mouth 2 (two) times daily with a meal for 14 days.        Contact information for after-discharge care     Destination     [facility]erred [facility]F .   Service: Skilled Nursing Contact information: [address] [place] [phone]                    32 minutes Signed: Sharen Hones 08/01/2021, 10:55 AM

## 2021-08-02 LAB — CULTURE, BLOOD (ROUTINE X 2)
Culture: NO GROWTH
Special Requests: ADEQUATE

## 2021-08-22 ENCOUNTER — Ambulatory Visit: Payer: Medicare Other | Admitting: Nurse Practitioner

## 2021-09-07 ENCOUNTER — Emergency Department: Payer: Medicare Other

## 2021-09-07 ENCOUNTER — Other Ambulatory Visit: Payer: Self-pay

## 2021-09-07 ENCOUNTER — Encounter: Payer: Self-pay | Admitting: Radiology

## 2021-09-07 ENCOUNTER — Inpatient Hospital Stay
Admission: EM | Admit: 2021-09-07 | Discharge: 2021-09-18 | DRG: 480 | Disposition: A | Discharge status: Skilled Nursing Facility | Payer: Medicare Other | Attending: Internal Medicine | Admitting: Internal Medicine

## 2021-09-07 DIAGNOSIS — N184 Chronic kidney disease, stage 4 (severe): Secondary | ICD-10-CM

## 2021-09-07 DIAGNOSIS — S72002S Fracture of unspecified part of neck of left femur, sequela: Secondary | ICD-10-CM | POA: Diagnosis not present

## 2021-09-07 DIAGNOSIS — E538 Deficiency of other specified B group vitamins: Secondary | ICD-10-CM | POA: Diagnosis present

## 2021-09-07 DIAGNOSIS — Z79899 Other long term (current) drug therapy: Secondary | ICD-10-CM | POA: Diagnosis not present

## 2021-09-07 DIAGNOSIS — Z9049 Acquired absence of other specified parts of digestive tract: Secondary | ICD-10-CM | POA: Diagnosis not present

## 2021-09-07 DIAGNOSIS — W19XXXA Unspecified fall, initial encounter: Secondary | ICD-10-CM | POA: Diagnosis not present

## 2021-09-07 DIAGNOSIS — Z933 Colostomy status: Secondary | ICD-10-CM | POA: Diagnosis not present

## 2021-09-07 DIAGNOSIS — D649 Anemia, unspecified: Secondary | ICD-10-CM | POA: Diagnosis not present

## 2021-09-07 DIAGNOSIS — Z7982 Long term (current) use of aspirin: Secondary | ICD-10-CM | POA: Diagnosis not present

## 2021-09-07 DIAGNOSIS — D631 Anemia in chronic kidney disease: Secondary | ICD-10-CM | POA: Diagnosis present

## 2021-09-07 DIAGNOSIS — D509 Iron deficiency anemia, unspecified: Secondary | ICD-10-CM | POA: Diagnosis present

## 2021-09-07 DIAGNOSIS — I1310 Hypertensive heart and chronic kidney disease without heart failure, with stage 1 through stage 4 chronic kidney disease, or unspecified chronic kidney disease: Secondary | ICD-10-CM | POA: Diagnosis present

## 2021-09-07 DIAGNOSIS — Z87440 Personal history of urinary (tract) infections: Secondary | ICD-10-CM

## 2021-09-07 DIAGNOSIS — N1832 Chronic kidney disease, stage 3b: Secondary | ICD-10-CM | POA: Diagnosis present

## 2021-09-07 DIAGNOSIS — Y92009 Unspecified place in unspecified non-institutional (private) residence as the place of occurrence of the external cause: Secondary | ICD-10-CM

## 2021-09-07 DIAGNOSIS — U071 COVID-19: Secondary | ICD-10-CM

## 2021-09-07 DIAGNOSIS — S72142A Displaced intertrochanteric fracture of left femur, initial encounter for closed fracture: Secondary | ICD-10-CM | POA: Diagnosis present

## 2021-09-07 DIAGNOSIS — D62 Acute posthemorrhagic anemia: Secondary | ICD-10-CM | POA: Diagnosis not present

## 2021-09-07 DIAGNOSIS — Y9301 Activity, walking, marching and hiking: Secondary | ICD-10-CM | POA: Diagnosis present

## 2021-09-07 DIAGNOSIS — Z6832 Body mass index (BMI) 32.0-32.9, adult: Secondary | ICD-10-CM

## 2021-09-07 DIAGNOSIS — Z01818 Encounter for other preprocedural examination: Secondary | ICD-10-CM | POA: Diagnosis not present

## 2021-09-07 DIAGNOSIS — Z66 Do not resuscitate: Secondary | ICD-10-CM | POA: Diagnosis present

## 2021-09-07 DIAGNOSIS — E785 Hyperlipidemia, unspecified: Secondary | ICD-10-CM | POA: Diagnosis present

## 2021-09-07 DIAGNOSIS — I959 Hypotension, unspecified: Secondary | ICD-10-CM | POA: Diagnosis not present

## 2021-09-07 DIAGNOSIS — W010XXA Fall on same level from slipping, tripping and stumbling without subsequent striking against object, initial encounter: Secondary | ICD-10-CM | POA: Diagnosis present

## 2021-09-07 DIAGNOSIS — E669 Obesity, unspecified: Secondary | ICD-10-CM | POA: Diagnosis present

## 2021-09-07 DIAGNOSIS — L89152 Pressure ulcer of sacral region, stage 2: Secondary | ICD-10-CM | POA: Diagnosis present

## 2021-09-07 DIAGNOSIS — M25552 Pain in left hip: Secondary | ICD-10-CM | POA: Diagnosis present

## 2021-09-07 DIAGNOSIS — Z85048 Personal history of other malignant neoplasm of rectum, rectosigmoid junction, and anus: Secondary | ICD-10-CM | POA: Diagnosis not present

## 2021-09-07 DIAGNOSIS — Y92000 Kitchen of unspecified non-institutional (private) residence as  the place of occurrence of the external cause: Secondary | ICD-10-CM

## 2021-09-07 DIAGNOSIS — I10 Essential (primary) hypertension: Secondary | ICD-10-CM | POA: Diagnosis not present

## 2021-09-07 DIAGNOSIS — D638 Anemia in other chronic diseases classified elsewhere: Secondary | ICD-10-CM

## 2021-09-07 DIAGNOSIS — R1904 Left lower quadrant abdominal swelling, mass and lump: Secondary | ICD-10-CM

## 2021-09-07 DIAGNOSIS — S72002A Fracture of unspecified part of neck of left femur, initial encounter for closed fracture: Secondary | ICD-10-CM | POA: Diagnosis not present

## 2021-09-07 DIAGNOSIS — E871 Hypo-osmolality and hyponatremia: Secondary | ICD-10-CM | POA: Diagnosis present

## 2021-09-07 DIAGNOSIS — S72009A Fracture of unspecified part of neck of unspecified femur, initial encounter for closed fracture: Secondary | ICD-10-CM | POA: Diagnosis present

## 2021-09-07 DIAGNOSIS — Z419 Encounter for procedure for purposes other than remedying health state, unspecified: Secondary | ICD-10-CM

## 2021-09-07 DIAGNOSIS — K5900 Constipation, unspecified: Secondary | ICD-10-CM | POA: Diagnosis present

## 2021-09-07 DIAGNOSIS — L8990 Pressure ulcer of unspecified site, unspecified stage: Secondary | ICD-10-CM | POA: Insufficient documentation

## 2021-09-07 LAB — CBC WITH DIFFERENTIAL/PLATELET
Abs Immature Granulocytes: 0.06 10*3/uL (ref 0.00–0.07)
Basophils Absolute: 0 10*3/uL (ref 0.0–0.1)
Basophils Relative: 1 %
Eosinophils Absolute: 0 10*3/uL (ref 0.0–0.5)
Eosinophils Relative: 0 %
HCT: 26.1 % — ABNORMAL LOW (ref 36.0–46.0)
Hemoglobin: 8.6 g/dL — ABNORMAL LOW (ref 12.0–15.0)
Immature Granulocytes: 1 %
Lymphocytes Relative: 20 %
Lymphs Abs: 1.5 10*3/uL (ref 0.7–4.0)
MCH: 30.7 pg (ref 26.0–34.0)
MCHC: 33 g/dL (ref 30.0–36.0)
MCV: 93.2 fL (ref 80.0–100.0)
Monocytes Absolute: 1 10*3/uL (ref 0.1–1.0)
Monocytes Relative: 13 %
Neutro Abs: 4.9 10*3/uL (ref 1.7–7.7)
Neutrophils Relative %: 65 %
Platelets: 245 10*3/uL (ref 150–400)
RBC: 2.8 MIL/uL — ABNORMAL LOW (ref 3.87–5.11)
RDW: 14.2 % (ref 11.5–15.5)
WBC: 7.5 10*3/uL (ref 4.0–10.5)
nRBC: 0 % (ref 0.0–0.2)

## 2021-09-07 LAB — COMPREHENSIVE METABOLIC PANEL
ALT: 13 U/L (ref 0–44)
AST: 23 U/L (ref 15–41)
Albumin: 3.2 g/dL — ABNORMAL LOW (ref 3.5–5.0)
Alkaline Phosphatase: 60 U/L (ref 38–126)
Anion gap: 9 (ref 5–15)
BUN: 39 mg/dL — ABNORMAL HIGH (ref 8–23)
CO2: 23 mmol/L (ref 22–32)
Calcium: 8.5 mg/dL — ABNORMAL LOW (ref 8.9–10.3)
Chloride: 101 mmol/L (ref 98–111)
Creatinine, Ser: 1.55 mg/dL — ABNORMAL HIGH (ref 0.44–1.00)
GFR, Estimated: 30 mL/min — ABNORMAL LOW (ref >60)
Glucose, Bld: 123 mg/dL — ABNORMAL HIGH (ref 70–99)
Potassium: 4.4 mmol/L (ref 3.5–5.1)
Sodium: 133 mmol/L — ABNORMAL LOW (ref 135–145)
Total Bilirubin: 0.7 mg/dL (ref 0.3–1.2)
Total Protein: 6.2 g/dL — ABNORMAL LOW (ref 6.5–8.1)

## 2021-09-07 LAB — RESP PANEL BY RT-PCR (FLU A&B, COVID) ARPGX2
Influenza A by PCR: NEGATIVE
Influenza B by PCR: NEGATIVE
SARS Coronavirus 2 by RT PCR: POSITIVE — AB

## 2021-09-07 LAB — PROTIME-INR
INR: 1.1 (ref 0.8–1.2)
Prothrombin Time: 14.3 seconds (ref 11.4–15.2)

## 2021-09-07 MED ORDER — ZOLPIDEM TARTRATE 5 MG PO TABS: 5.0000 mg | ORAL_TABLET | Freq: Every evening | ORAL | Status: DC | PRN

## 2021-09-07 MED ORDER — ACETAMINOPHEN 325 MG PO TABS
650.0000 mg | ORAL_TABLET | Freq: Once | ORAL | Status: AC
  Administered 2021-09-07: 650 mg via ORAL
  Filled 2021-09-07: qty 2

## 2021-09-07 MED ORDER — FENTANYL CITRATE PF 50 MCG/ML IJ SOSY
12.5000 ug | PREFILLED_SYRINGE | INTRAMUSCULAR | Status: DC | PRN
Start: 2021-09-07 — End: 2021-09-08
  Administered 2021-09-08 (×2): 12.5 ug via INTRAVENOUS
  Filled 2021-09-07 (×2): qty 1

## 2021-09-07 MED ORDER — SIMVASTATIN 20 MG PO TABS
20.0000 mg | ORAL_TABLET | Freq: Every day | ORAL | Status: DC
  Administered 2021-09-08 – 2021-09-17 (×10): 20 mg via ORAL
  Filled 2021-09-07 (×10): qty 1

## 2021-09-07 MED ORDER — CEFAZOLIN SODIUM-DEXTROSE 2-4 GM/100ML-% IV SOLN
2.0000 g | Freq: Once | INTRAVENOUS | Status: AC
  Administered 2021-09-08: 2 g via INTRAVENOUS
  Filled 2021-09-07: qty 100

## 2021-09-07 MED ORDER — FENTANYL CITRATE PF 50 MCG/ML IJ SOSY
25.0000 ug | PREFILLED_SYRINGE | INTRAMUSCULAR | Status: DC | PRN
  Administered 2021-09-07: 25 ug via INTRAVENOUS
  Filled 2021-09-07: qty 1

## 2021-09-07 MED ORDER — TRANEXAMIC ACID-NACL 1000-0.7 MG/100ML-% IV SOLN
1000.0000 mg | Freq: Once | INTRAVENOUS | Status: AC
  Administered 2021-09-07: 1000 mg via INTRAVENOUS
  Filled 2021-09-07: qty 100

## 2021-09-07 MED ORDER — PROCHLORPERAZINE EDISYLATE 10 MG/2ML IJ SOLN
5.0000 mg | Freq: Four times a day (QID) | INTRAMUSCULAR | Status: DC | PRN
  Administered 2021-09-08: 5 mg via INTRAVENOUS
  Filled 2021-09-07: qty 1
  Filled 2021-09-07: qty 2

## 2021-09-07 MED ORDER — METOPROLOL TARTRATE 25 MG PO TABS: 25.0000 mg | ORAL_TABLET | Freq: Two times a day (BID) | ORAL | Status: DC

## 2021-09-07 MED ORDER — ALPRAZOLAM 0.25 MG PO TABS
0.2500 mg | ORAL_TABLET | Freq: Two times a day (BID) | ORAL | Status: DC | PRN
  Administered 2021-09-11 – 2021-09-18 (×7): 0.25 mg via ORAL
  Filled 2021-09-07 (×8): qty 1

## 2021-09-07 MED ORDER — TRANEXAMIC ACID FOR EPISTAXIS
500.0000 mg | Freq: Once | TOPICAL | Status: DC
  Filled 2021-09-07: qty 10

## 2021-09-07 MED ORDER — SODIUM CHLORIDE 0.9% FLUSH
3.0000 mL | Freq: Two times a day (BID) | INTRAVENOUS | Status: DC
  Administered 2021-09-07 – 2021-09-08 (×2): 3 mL via INTRAVENOUS

## 2021-09-07 MED ORDER — KCL IN DEXTROSE-NACL 10-5-0.45 MEQ/L-%-% IV SOLN
INTRAVENOUS | Status: DC
  Filled 2021-09-07 (×3): qty 1000

## 2021-09-07 MED ORDER — PANTOPRAZOLE SODIUM 40 MG IV SOLR: 40.0000 mg | Freq: Two times a day (BID) | INTRAVENOUS | Status: DC

## 2021-09-07 MED ORDER — SERTRALINE HCL 50 MG PO TABS
50.0000 mg | ORAL_TABLET | Freq: Every day | ORAL | Status: DC
  Administered 2021-09-09 – 2021-09-18 (×10): 50 mg via ORAL
  Filled 2021-09-07 (×10): qty 1

## 2021-09-07 NOTE — ED Triage Notes (Signed)
Pt presents via EMS following a fall after turning around too quickly in her kitchen. Notable shortening of her left lower extremity. Pt received 50mg f of Fentanyl PTA from EMS and her pain has improved from 10/10 to 8/10. Pt denies LOC, CP or SOB.

## 2021-09-07 NOTE — ED Notes (Signed)
Pt to XR

## 2021-09-07 NOTE — ED Notes (Signed)
Pt back from XR 

## 2021-09-07 NOTE — H&P (Signed)
History and Physical    Autumn Johnston ZTI:[id number] DOB: Apr 25, 1922 DOA: 09/07/2021  Referring MD/NP/PA:   PCP: Idelle Crouch, MD   Patient coming from:  The patient is coming from home.  At baseline, pt is independent for most of ADL.        Chief Complaint: fall left hip pain  HPI: Autumn Johnston is a 87 y.o. female with medical history significant of HTN, CKD4, colon cancer s/p colectomy with colostomy, hyponatremia, elevated troponin, malnutrition presented with fall, left hip pain  When I saw patient, she is alert, slightly confused, not really good historian,  daughter at the bedside.  Per both, chart reviewed, today patient at her usual medical [place], when patient walking in the kitchen, she tripped and fall, not hit her head, not lost consciousness, not lost control of bowel or urination.  She had left hip pain, she was sent to the ED.  Patient reports no fever, chills, chest pain, palpitation, shortness of breath, cough, abdominal pain,  nausea, vomiting, diarrhea, dysuria, leg swelling.  Of note, recently patient was admitted for severe hyponatremia, UTI on August.  Also found elevated troponin and was on heparin drip for 2 days.  ED Course: pt was found to have temperature 98, heart rate 72-85, respirate 18, blood pressure 129/61-1 6 7/81, oxygen saturation 98% on room air.  Labs show sodium 133, glucose 123, BUN 39, creatinine 1.55, baseline 1.28-1.4, albumin 3.2, hemoglobin 8.6, baseline 9.9, otherwise unremarkable CBC with differential, SARS-CoV-2 negative, flu negative, Chest x-ray: Emphysematous and chronic bronchitic changes in the lungs. No evidence of active pulmonary disease. Left hip x-ray: Minimally displaced intertrochanteric fracture of the proximal left femur. EKG: Sinus rhythm, nonspecific ST-T change  In the ED, patient was given fentanyl, tranexamic acid, cefazolin Orthopedic consulted, plan to the [facility] was requested to admit  patient      Review of Systems:   General: no fevers, chills, no body weight gain HEENT: no blurry vision, hearing changes or sore throat Respiratory: no dyspnea, coughing, wheezing CV: no chest pain, no palpitations GI: no nausea, vomiting, abdominal pain, diarrhea, constipation GU: no dysuria, burning on urination, increased urinary frequency, hematuria  Ext: no leg edema Neuro: no unilateral weakness, numbness, or tingling, no vision change or hearing loss Skin: no rash, no skin tear. MSK: No muscle spasm, no deformity, no limitation of range of movement in spin Heme: No easy bruising.  Travel history: No recent long distant travel.  Allergy: No Known Allergies  Past Medical History:  Diagnosis Date   CKD (chronic kidney disease) stage 4, GFR 15-29 ml/min (HCC)    Colon cancer (HCC)    Hypertension     Past Surgical History:  Procedure Laterality Date   COLECTOMY WITH COLOSTOMY CREATION/HARTMANN PROCEDURE      Social History:  reports that she has never smoked. She has never used smokeless tobacco. She reports that she does not drink alcohol and does not use drugs.  Family History: No family history on file.   Prior to Admission medications   Medication Sig Start Date End Date Taking? Authorizing Provider  ALPRAZolam (XANAX) 0.25 MG tablet Take 1 tablet (0.25 mg total) by mouth 2 (two) times daily as needed. 08/01/21  Yes Sharen Hones, MD  amLODipine (NORVASC) 10 MG tablet Take 10 mg by mouth daily.   Yes [provider]  aspirin EC 81 MG EC tablet Take 1 tablet (81 mg total) by mouth daily. Swallow whole. 08/02/21  Yes Sharen Hones, MD  metoprolol tartrate (LOPRESSOR) 25 MG tablet Take 1 tablet (25 mg total) by mouth 2 (two) times daily. 08/01/21  Yes Sharen Hones, MD  sertraline (ZOLOFT) 50 MG tablet Take 50 mg by mouth daily. 05/24/21  Yes [provider]  simvastatin (ZOCOR) 20 MG tablet Take 20 mg by mouth at bedtime. 05/24/21  Yes [provider]  zolpidem (AMBIEN) 10 MG tablet Take 10 mg by mouth at bedtime as needed. 08/22/21  Yes [provider]  feeding supplement (ENSURE ENLIVE / ENSURE PLUS) LIQD Take 237 mLs by mouth 3 (three) times daily between meals. 08/01/21   Sharen Hones, MD  QUEtiapine (SEROQUEL) 25 MG tablet Take 1 tablet (25 mg total) by mouth at bedtime. Patient not taking: Reported on 09/07/2021 08/01/21   Sharen Hones, MD    Physical Exam: Vitals:   09/07/21 2018 09/07/21 2019 09/07/21 2100 09/07/21 2200  BP:  (!) 167/81 135/61 129/61  Pulse:  85 72 73  Resp:  16 11 18   Temp:  98 F (36.7 C)    TempSrc:  Oral    SpO2:  97% 97% 98%  Weight: 82.1 kg     Height: 5\' 3"  (1.6 m)      General: Not in acute distress HEENT:       Eyes: PERRL, EOMI, no scleral icterus.       ENT: No discharge from the ears and nose, no pharynx injection, no tonsillar enlargement.        Neck: No JVD, no bruit, no mass felt. Heme: No neck lymph node enlargement. Cardiac: S1/S2, RRR, No murmurs, No gallops or rubs. Respiratory: Good air movement bilaterally. No rales, wheezing, rhonchi or rubs. GI: Soft, nondistended, nontender, no rebound pain, no organomegaly, BS present. GU: No hematuria Ext: No pitting leg edema bilaterally. 2+DP/PT pulse bilaterally. Musculoskeletal: Left hip area tender Skin: No rashes.  Neuro: Alert, oriented X3, no focal neurodeficit Psych: Patient is not psychotic, no suicidal or hemocidal ideation.  Labs on Admission: I have personally reviewed following labs and imaging studies  CBC: Recent Labs  Lab 09/07/21 2027  WBC 7.5  NEUTROABS 4.9  HGB 8.6*  HCT 26.1*  MCV 93.2  PLT 277   Basic Metabolic Panel: Recent Labs  Lab 09/07/21 2027  NA 133*  K 4.4  CL 101  CO2 23  GLUCOSE 123*  BUN 39*  CREATININE 1.55*  CALCIUM 8.5*   GFR: Estimated Creatinine Clearance: 21.5 mL/min (A) (by C-G formula based on SCr of 1.55 mg/dL (H)). Liver Function Tests: Recent Labs  Lab  09/07/21 2027  AST 23  ALT 13  ALKPHOS 60  BILITOT 0.7  PROT 6.2*  ALBUMIN 3.2*   No results for input(s): LIPASE, AMYLASE in the last 168 hours. No results for input(s): AMMONIA in the last 168 hours. Coagulation Profile: No results for input(s): INR, PROTIME in the last 168 hours. Cardiac Enzymes: No results for input(s): CKTOTAL, CKMB, CKMBINDEX, TROPONINI in the last 168 hours. BNP (last 3 results) No results for input(s): PROBNP in the last 8760 hours. HbA1C: No results for input(s): HGBA1C in the last 72 hours. CBG: No results for input(s): GLUCAP in the last 168 hours. Lipid Profile: No results for input(s): CHOL, HDL, LDLCALC, TRIG, CHOLHDL, LDLDIRECT in the last 72 hours. Thyroid Function Tests: No results for input(s): TSH, T4TOTAL, FREET4, T3FREE, THYROIDAB in the last 72 hours. Anemia Panel: No results for input(s): VITAMINB12, FOLATE, FERRITIN, TIBC, IRON, RETICCTPCT in the  last 72 hours. Urine analysis:    Component Value Date/Time   COLORURINE RED (A) 07/30/2021 1101   APPEARANCEUR TURBID (A) 07/30/2021 1101   LABSPEC 1.018 07/30/2021 1101   PHURINE  07/30/2021 1101    TEST NOT REPORTED DUE TO COLOR INTERFERENCE OF URINE PIGMENT   GLUCOSEU (A) 07/30/2021 1101    TEST NOT REPORTED DUE TO COLOR INTERFERENCE OF URINE PIGMENT   HGBUR (A) 07/30/2021 1101    TEST NOT REPORTED DUE TO COLOR INTERFERENCE OF URINE PIGMENT   BILIRUBINUR (A) 07/30/2021 1101    TEST NOT REPORTED DUE TO COLOR INTERFERENCE OF URINE PIGMENT   KETONESUR (A) 07/30/2021 1101    TEST NOT REPORTED DUE TO COLOR INTERFERENCE OF URINE PIGMENT   PROTEINUR (A) 07/30/2021 1101    TEST NOT REPORTED DUE TO COLOR INTERFERENCE OF URINE PIGMENT   NITRITE (A) 07/30/2021 1101    TEST NOT REPORTED DUE TO COLOR INTERFERENCE OF URINE PIGMENT   LEUKOCYTESUR (A) 07/30/2021 1101    TEST NOT REPORTED DUE TO COLOR INTERFERENCE OF URINE PIGMENT   Sepsis Labs: @LABRCNTIP (procalcitonin:4,lacticidven:4) )No  results found for this or any previous visit (from the past 240 hour(s)).   Radiological Exams on Admission: DG Chest Portable 1 View  Result Date: 09/07/2021 CLINICAL DATA:  Pain after a fall. EXAM: PORTABLE CHEST 1 VIEW COMPARISON:  07/27/2021 FINDINGS: Cardiac enlargement. No vascular congestion, edema, or consolidation in the lungs. Emphysematous changes. Peribronchial thickening consistent with chronic bronchitis. No pleural effusions. No pneumothorax. Calcification of the aorta. Similar appearance to previous study. IMPRESSION: Emphysematous and chronic bronchitic changes in the lungs. No evidence of active pulmonary disease. Electronically Signed   By: Lucienne Capers M.D.   On: 09/07/2021 21:53   DG Hip Unilat W or Wo Pelvis 2-3 Views Left  Result Date: 09/07/2021 CLINICAL DATA:  Left hip pain EXAM: DG HIP (WITH OR WITHOUT PELVIS) 2-3V LEFT COMPARISON:  None. FINDINGS: There is a minimally displaced intertrochanteric fracture of the proximal left femur. IMPRESSION: Minimally displaced intertrochanteric fracture of the proximal left femur. Electronically Signed   By: Ulyses Jarred M.D.   On: 09/07/2021 22:05     EKG: Independently reviewed.  Not done in ED, will get one.   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  See below N.p.o., IV fluids  #Anemia: Etiology unclear, differential diagnosis is broad, ?  History of pernicious anemia Hemoglobin 8.6, apparently patient during last admission on 8/7, had hemoglobin  downtrending, hemoglobin 13.8 on 8/7, 9.9 on 8/12 Patient on aspirin, denies abdominal pain, bloody content in colostomy However still concerned possible GI bleeding Will check FOBT Anemia work-up lab to rule out other etiology Will hold aspirin, give Protonix IV twice daily  Dependent repeat CBC, pending lab, need discuss with orthopedic to see if they are okay to pursue operation  #history significant of HTN, CKD4, colon cancer s/p colectomy with colostomy, hyponatremia, elevated troponin, malnutrition presented with fall, left hip pain Established Active problems see above Continue home medications if appropriate follow-up with his PCP,  specialist when necessary   DVT ppx: SCD Code Status: DNR Family Communication:             Yes, patient's    daughter at bed side Disposition Plan:  Anticipate discharge back to previous home environment Consults called:   Admission status: Obs / tele  Inpatient/tele   medical floor/obs     SDU/inpation       Date of Service 09/07/2021    Lenna Sciara [facility]   If 7PM-7AM, please contact night-coverage [email or web address] Password [email or web address] 09/07/2021, 10:10 PM

## 2021-09-07 NOTE — Progress Notes (Signed)
Full consult note and discussion with patient to follow tomorrow.  Called by ED staff. Imaging reviewed.  - Plan for surgery (L hip IM nailing) tomorrow, likely late morning/early afternoon.  - NPO after midnight - Hold anticoagulation - 1g TXA in ED - Admit to Hospitalist team.

## 2021-09-07 NOTE — ED Provider Notes (Signed)
[facility] Emergency Department Provider Note    Event Date/Time   First MD Initiated Contact with Patient 09/07/21 2027     (approximate)  I have reviewed the triage vital signs and the nursing notes.   HISTORY  Chief Complaint Fall    HPI Autumn Johnston is a 87 y.o. female below listed past medical history presents to the ER for acute left hip pain that occurred while patient was turning while up in kitchen.  With her leg gave out she fell.  Did not hit her head.  Denies any other associated pain and discomfort.  No knee pain or leg pain.  No back pain.  Not on any blood thinners.  Denies any chest pain or shortness of breath.  Past Medical History:  Diagnosis Date   CKD (chronic kidney disease) stage 4, GFR 15-29 ml/min (HCC)    Colon cancer ([place])    Hypertension    No family history on file. Past Surgical History:  Procedure Laterality Date   COLECTOMY WITH COLOSTOMY CREATION/HARTMANN PROCEDURE     Patient Active Problem List   Diagnosis Date Noted   Protein-calorie malnutrition, severe 07/31/2021   Nonsustained ventricular tachycardia ([place]) 07/30/2021   Hyponatremia 07/29/2021   Demand ischemia ([place])    Hypertensive urgency 07/28/2021   Protein calorie malnutrition ([place]) 07/28/2021   Benign hypertension 07/27/2021   Hyperlipidemia 07/27/2021   Pernicious anemia 07/27/2021   Renal failure (ARF), acute on chronic (HCC) 07/27/2021   Acute hyponatremia 07/27/2021   Elevated troponin 85/46/2703   Acute metabolic encephalopathy 50/08/3817   Chronic insomnia 04/05/2017   Chronic kidney disease (CKD), stage IV (severe) ([place]) 12/25/2015   Anemia 07/20/2014      Prior to Admission medications   Medication Sig Start Date End Date Taking? Authorizing Provider  ALPRAZolam (XANAX) 0.25 MG tablet Take 1 tablet (0.25 mg total) by mouth 2 (two) times daily as needed. 08/01/21  Yes Sharen Hones, MD  amLODipine (NORVASC) 10 MG tablet Take 10 mg  by mouth daily.   Yes [provider]  aspirin EC 81 MG EC tablet Take 1 tablet (81 mg total) by mouth daily. Swallow whole. 08/02/21  Yes Sharen Hones, MD  metoprolol tartrate (LOPRESSOR) 25 MG tablet Take 1 tablet (25 mg total) by mouth 2 (two) times daily. 08/01/21  Yes Sharen Hones, MD  sertraline (ZOLOFT) 50 MG tablet Take 50 mg by mouth daily. 05/24/21  Yes [provider]  simvastatin (ZOCOR) 20 MG tablet Take 20 mg by mouth at bedtime. 05/24/21  Yes [provider]  zolpidem (AMBIEN) 10 MG tablet Take 10 mg by mouth at bedtime as needed. 08/22/21  Yes [provider]  feeding supplement (ENSURE ENLIVE / ENSURE PLUS) LIQD Take 237 mLs by mouth 3 (three) times daily between meals. 08/01/21   Sharen Hones, MD  QUEtiapine (SEROQUEL) 25 MG tablet Take 1 tablet (25 mg total) by mouth at bedtime. Patient not taking: Reported on 09/07/2021 08/01/21   Sharen Hones, MD    Allergies Patient has no known allergies.    Social History Social History   Tobacco Use   Smoking status: Never   Smokeless tobacco: Never  Substance Use Topics   Alcohol use: Never   Drug use: Never    Review of Systems Patient denies headaches, rhinorrhea, blurry vision, numbness, shortness of breath, chest pain, edema, cough, abdominal pain, nausea, vomiting, diarrhea, dysuria, fevers, rashes or hallucinations unless otherwise stated above in HPI. ____________________________________________  PHYSICAL EXAM:  VITAL SIGNS: Vitals:   09/07/21 2100 09/07/21 2200  BP: 135/61 129/61  Pulse: 72 73  Resp: 11 18  Temp:    SpO2: 97% 98%    Constitutional: Alert and oriented.  Eyes: Conjunctivae are normal.  Head: Atraumatic. Nose: No congestion/rhinnorhea. Mouth/Throat: Mucous membranes are moist.   Neck: No stridor. Painless ROM.  Cardiovascular: Normal rate, regular rhythm. Grossly normal heart sounds.  Good peripheral circulation. Respiratory: Normal respiratory effort.  No  retractions. Lungs CTAB. Gastrointestinal: Soft and nontender.  Colostomy present genitourinary:  Musculoskeletal: Left leg shortened.  Pain with logroll on left side.  Neurovascular intact distally.  No joint effusions. Neurologic:  Normal speech and language. No gross focal neurologic deficits are appreciated. No facial droop Skin:  Skin is warm, dry and intact. No rash noted. Psychiatric: Mood and affect are normal. Speech and behavior are normal.  ____________________________________________   LABS (all labs ordered are listed, but only abnormal results are displayed)  Results for orders placed or performed during the hospital encounter of 09/07/21 (from the past 24 hour(s))  CBC with Differential     Status: Abnormal   Collection Time: 09/07/21  8:27 PM  Result Value Ref Range   WBC 7.5 4.0 - 10.5 K/uL   RBC 2.80 (L) 3.87 - 5.11 MIL/uL   Hemoglobin 8.6 (L) 12.0 - 15.0 g/dL   HCT 26.1 (L) 36.0 - 46.0 %   MCV 93.2 80.0 - 100.0 fL   MCH 30.7 26.0 - 34.0 pg   MCHC 33.0 30.0 - 36.0 g/dL   RDW 14.2 11.5 - 15.5 %   Platelets 245 150 - 400 K/uL   nRBC 0.0 0.0 - 0.2 %   Neutrophils Relative % 65 %   Neutro Abs 4.9 1.7 - 7.7 K/uL   Lymphocytes Relative 20 %   Lymphs Abs 1.5 0.7 - 4.0 K/uL   Monocytes Relative 13 %   Monocytes Absolute 1.0 0.1 - 1.0 K/uL   Eosinophils Relative 0 %   Eosinophils Absolute 0.0 0.0 - 0.5 K/uL   Basophils Relative 1 %   Basophils Absolute 0.0 0.0 - 0.1 K/uL   Immature Granulocytes 1 %   Abs Immature Granulocytes 0.06 0.00 - 0.07 K/uL  Comprehensive metabolic panel     Status: Abnormal   Collection Time: 09/07/21  8:27 PM  Result Value Ref Range   Sodium 133 (L) 135 - 145 mmol/L   Potassium 4.4 3.5 - 5.1 mmol/L   Chloride 101 98 - 111 mmol/L   CO2 23 22 - 32 mmol/L   Glucose, Bld 123 (H) 70 - 99 mg/dL   BUN 39 (H) 8 - 23 mg/dL   Creatinine, Ser 1.55 (H) 0.44 - 1.00 mg/dL   Calcium 8.5 (L) 8.9 - 10.3 mg/dL   Total Protein 6.2 (L) 6.5 - 8.1 g/dL    Albumin 3.2 (L) 3.5 - 5.0 g/dL   AST 23 15 - 41 U/L   ALT 13 0 - 44 U/L   Alkaline Phosphatase 60 38 - 126 U/L   Total Bilirubin 0.7 0.3 - 1.2 mg/dL   GFR, Estimated 30 (L) >60 mL/min   Anion gap 9 5 - 15  Protime-INR     Status: None   Collection Time: 09/07/21  8:27 PM  Result Value Ref Range   Prothrombin Time 14.3 11.4 - 15.2 seconds   INR 1.1 0.8 - 1.2   ____________________________________________  EKG My review and personal interpretation at Time: 21:43  Indication: fall  Rate: 80  Rhythm: sinus Axis: normal Other: poor r wave progression ____________________________________________  RADIOLOGY  I personally reviewed all radiographic images ordered to evaluate for the above acute complaints and reviewed radiology reports and findings.  These findings were personally discussed with the patient.  Please see medical record for radiology report.  ____________________________________________   PROCEDURES  Procedure(s) performed:  Procedures    Critical Care performed: no ____________________________________________   INITIAL IMPRESSION / ASSESSMENT AND PLAN / ED COURSE  Pertinent labs & imaging results that were available during my care of the patient were reviewed by me and considered in my medical decision making (see chart for details).   DDX: fracture, contusion, dislocation, msk strain  Queen Abbett is a 87 y.o. who presents to the ED with fall with evidence of left hip fracture as described above.  The patient will be placed on continuous pulse oximetry and telemetry for monitoring.  Laboratory evaluation will be sent to evaluate for the above complaints.     Clinical Course as of 09/07/21 2235  Nancy Fetter Sep 07, 2021  2148 Discussed patient with Dr. Posey Pronto of orthopedics.  Patient will be admitted to the hospitalist.  He has requested 1 g TXA to be given. [PR]    Clinical Course User Index [PR] Merlyn Lot, MD  Case discussed with hospitalist for  admission.  Patient given IV fentanyl for pain.  The patient was evaluated in Emergency Department today for the symptoms described in the history of present illness. He/she was evaluated in the context of the global COVID-19 pandemic, which necessitated consideration that the patient might be at risk for infection with the SARS-CoV-2 virus that causes COVID-19. Institutional protocols and algorithms that pertain to the evaluation of patients at risk for COVID-19 are in a state of rapid change based on information released by regulatory bodies including the CDC and federal and state organizations. These policies and algorithms were followed during the patient's care in the ED.  As part of my medical decision making, I reviewed the following data within the [facility] notes reviewed and incorporated, Labs reviewed, notes from prior ED visits and [place] Controlled Substance Database   ____________________________________________   FINAL CLINICAL IMPRESSION(S) / ED DIAGNOSES  Final diagnoses:  Closed displaced intertrochanteric fracture of left femur, initial encounter ([place])      NEW MEDICATIONS STARTED DURING THIS VISIT:  New Prescriptions   No medications on file     Note:  This document was prepared using Dragon voice recognition software and may include unintentional dictation errors.    Merlyn Lot, MD 09/07/21 2235

## 2021-09-08 ENCOUNTER — Inpatient Hospital Stay: Payer: Medicare Other | Admitting: Anesthesiology

## 2021-09-08 ENCOUNTER — Encounter: Payer: Self-pay | Admitting: Internal Medicine

## 2021-09-08 ENCOUNTER — Encounter
Admission: EM | Disposition: A | Discharge status: Skilled Nursing Facility | Payer: Self-pay | Source: Home / Self Care | Attending: Internal Medicine

## 2021-09-08 ENCOUNTER — Inpatient Hospital Stay: Payer: Medicare Other

## 2021-09-08 DIAGNOSIS — I10 Essential (primary) hypertension: Secondary | ICD-10-CM

## 2021-09-08 DIAGNOSIS — N1832 Chronic kidney disease, stage 3b: Secondary | ICD-10-CM

## 2021-09-08 DIAGNOSIS — E785 Hyperlipidemia, unspecified: Secondary | ICD-10-CM

## 2021-09-08 DIAGNOSIS — D638 Anemia in other chronic diseases classified elsewhere: Secondary | ICD-10-CM | POA: Diagnosis not present

## 2021-09-08 DIAGNOSIS — Z01818 Encounter for other preprocedural examination: Secondary | ICD-10-CM | POA: Insufficient documentation

## 2021-09-08 DIAGNOSIS — S72002S Fracture of unspecified part of neck of left femur, sequela: Secondary | ICD-10-CM

## 2021-09-08 HISTORY — PX: INTRAMEDULLARY (IM) NAIL INTERTROCHANTERIC: SHX5875

## 2021-09-08 LAB — RETICULOCYTES
Immature Retic Fract: 7.9 % (ref 2.3–15.9)
RBC.: 2.8 MIL/uL — ABNORMAL LOW (ref 3.87–5.11)
Retic Count, Absolute: 44.2 10*3/uL (ref 19.0–186.0)
Retic Ct Pct: 1.6 % (ref 0.4–3.1)

## 2021-09-08 LAB — PROTIME-INR
INR: 1 (ref 0.8–1.2)
Prothrombin Time: 13.3 seconds (ref 11.4–15.2)

## 2021-09-08 LAB — IRON AND TIBC
Iron: 16 ug/dL — ABNORMAL LOW (ref 28–170)
Saturation Ratios: 7 % — ABNORMAL LOW (ref 10.4–31.8)
TIBC: 235 ug/dL — ABNORMAL LOW (ref 250–450)
UIBC: 219 ug/dL

## 2021-09-08 LAB — BASIC METABOLIC PANEL
Anion gap: 11 (ref 5–15)
BUN: 38 mg/dL — ABNORMAL HIGH (ref 8–23)
CO2: 20 mmol/L — ABNORMAL LOW (ref 22–32)
Calcium: 8.5 mg/dL — ABNORMAL LOW (ref 8.9–10.3)
Chloride: 101 mmol/L (ref 98–111)
Creatinine, Ser: 1.5 mg/dL — ABNORMAL HIGH (ref 0.44–1.00)
GFR, Estimated: 32 mL/min — ABNORMAL LOW (ref >60)
Glucose, Bld: 148 mg/dL — ABNORMAL HIGH (ref 70–99)
Potassium: 4.3 mmol/L (ref 3.5–5.1)
Sodium: 132 mmol/L — ABNORMAL LOW (ref 135–145)

## 2021-09-08 LAB — ABO/RH: ABO/RH(D): A POS

## 2021-09-08 LAB — CBC
HCT: 24.9 % — ABNORMAL LOW (ref 36.0–46.0)
Hemoglobin: 8.1 g/dL — ABNORMAL LOW (ref 12.0–15.0)
MCH: 30.3 pg (ref 26.0–34.0)
MCHC: 32.5 g/dL (ref 30.0–36.0)
MCV: 93.3 fL (ref 80.0–100.0)
Platelets: 258 10*3/uL (ref 150–400)
RBC: 2.67 MIL/uL — ABNORMAL LOW (ref 3.87–5.11)
RDW: 13.9 % (ref 11.5–15.5)
WBC: 7 10*3/uL (ref 4.0–10.5)
nRBC: 0 % (ref 0.0–0.2)

## 2021-09-08 LAB — APTT: aPTT: 22 seconds — ABNORMAL LOW (ref 24–36)

## 2021-09-08 LAB — FOLATE: Folate: 22 ng/mL (ref >5.9)

## 2021-09-08 LAB — VITAMIN B12: Vitamin B-12: 186 pg/mL (ref 180–914)

## 2021-09-08 LAB — FERRITIN: Ferritin: 202 ng/mL (ref 11–307)

## 2021-09-08 SURGERY — FIXATION, FRACTURE, INTERTROCHANTERIC, WITH INTRAMEDULLARY ROD
Anesthesia: Spinal | Site: Hip | Laterality: Left

## 2021-09-08 MED ORDER — PROPOFOL 10 MG/ML IV BOLUS
INTRAVENOUS | Status: DC | PRN
  Administered 2021-09-08: 15 mg via INTRAVENOUS
  Administered 2021-09-08 (×2): 10 mg via INTRAVENOUS

## 2021-09-08 MED ORDER — PROPOFOL 500 MG/50ML IV EMUL
INTRAVENOUS | Status: DC | PRN
  Administered 2021-09-08: 25 ug/kg/min via INTRAVENOUS

## 2021-09-08 MED ORDER — HYDROMORPHONE HCL 1 MG/ML IJ SOLN: 0.2000 mg | INTRAMUSCULAR | Status: DC | PRN

## 2021-09-08 MED ORDER — NEOMYCIN-POLYMYXIN B GU 40-200000 IR SOLN
Status: AC
  Filled 2021-09-08: qty 4

## 2021-09-08 MED ORDER — METOPROLOL TARTRATE 25 MG PO TABS
25.0000 mg | ORAL_TABLET | Freq: Two times a day (BID) | ORAL | Status: DC
  Administered 2021-09-08: 25 mg via ORAL
  Filled 2021-09-08: qty 1

## 2021-09-08 MED ORDER — PANTOPRAZOLE SODIUM 40 MG IV SOLR
40.0000 mg | Freq: Two times a day (BID) | INTRAVENOUS | Status: DC
  Administered 2021-09-08 – 2021-09-13 (×11): 40 mg via INTRAVENOUS
  Filled 2021-09-08 (×10): qty 40

## 2021-09-08 MED ORDER — QUETIAPINE FUMARATE 25 MG PO TABS
12.5000 mg | ORAL_TABLET | Freq: Every day | ORAL | Status: DC
  Administered 2021-09-08 – 2021-09-17 (×10): 12.5 mg via ORAL
  Filled 2021-09-08 (×10): qty 1

## 2021-09-08 MED ORDER — SODIUM CHLORIDE 0.9 % IV SOLN: INTRAVENOUS | Status: DC

## 2021-09-08 MED ORDER — PROPOFOL 1000 MG/100ML IV EMUL
INTRAVENOUS | Status: AC
  Filled 2021-09-08: qty 100

## 2021-09-08 MED ORDER — TRAMADOL HCL 50 MG PO TABS
50.0000 mg | ORAL_TABLET | Freq: Four times a day (QID) | ORAL | Status: DC | PRN
  Administered 2021-09-10 – 2021-09-16 (×4): 50 mg via ORAL
  Filled 2021-09-08 (×4): qty 1

## 2021-09-08 MED ORDER — METHOCARBAMOL 1000 MG/10ML IJ SOLN
500.0000 mg | Freq: Four times a day (QID) | INTRAVENOUS | Status: DC | PRN
  Filled 2021-09-08: qty 5

## 2021-09-08 MED ORDER — ONDANSETRON HCL 4 MG/2ML IJ SOLN
4.0000 mg | Freq: Four times a day (QID) | INTRAMUSCULAR | Status: DC | PRN
  Administered 2021-09-14: 4 mg via INTRAVENOUS
  Filled 2021-09-08: qty 2

## 2021-09-08 MED ORDER — ENOXAPARIN SODIUM 40 MG/0.4ML IJ SOSY
40.0000 mg | PREFILLED_SYRINGE | INTRAMUSCULAR | Status: DC
  Administered 2021-09-10: 40 mg via SUBCUTANEOUS
  Filled 2021-09-08: qty 0.4

## 2021-09-08 MED ORDER — METOCLOPRAMIDE HCL 10 MG PO TABS
5.0000 mg | ORAL_TABLET | Freq: Three times a day (TID) | ORAL | Status: DC | PRN
Start: 2021-09-08 — End: 2021-09-18

## 2021-09-08 MED ORDER — FENTANYL CITRATE (PF) 100 MCG/2ML IJ SOLN
INTRAMUSCULAR | Status: AC
  Filled 2021-09-08: qty 2

## 2021-09-08 MED ORDER — BUPIVACAINE HCL (PF) 0.5 % IJ SOLN
INTRAMUSCULAR | Status: AC
  Filled 2021-09-08: qty 30

## 2021-09-08 MED ORDER — PHENYLEPHRINE HCL (PRESSORS) 10 MG/ML IV SOLN
INTRAVENOUS | Status: DC | PRN
  Administered 2021-09-08: 50 ug via INTRAVENOUS
  Administered 2021-09-08: 150 ug via INTRAVENOUS
  Administered 2021-09-08: 100 ug via INTRAVENOUS
  Administered 2021-09-08: 150 ug via INTRAVENOUS

## 2021-09-08 MED ORDER — ONDANSETRON HCL 4 MG/2ML IJ SOLN
INTRAMUSCULAR | Status: DC | PRN
  Administered 2021-09-08: 4 mg via INTRAVENOUS

## 2021-09-08 MED ORDER — BUPIVACAINE LIPOSOME 1.3 % IJ SUSP
INTRAMUSCULAR | Status: AC
  Filled 2021-09-08: qty 20

## 2021-09-08 MED ORDER — ACETAMINOPHEN 500 MG PO TABS
1000.0000 mg | ORAL_TABLET | Freq: Three times a day (TID) | ORAL | Status: DC
  Administered 2021-09-08 – 2021-09-18 (×28): 1000 mg via ORAL
  Filled 2021-09-08 (×29): qty 2

## 2021-09-08 MED ORDER — OXYCODONE HCL 5 MG PO TABS: 2.5000 mg | ORAL_TABLET | ORAL | Status: DC | PRN

## 2021-09-08 MED ORDER — METOCLOPRAMIDE HCL 5 MG/ML IJ SOLN: 5.0000 mg | Freq: Three times a day (TID) | INTRAMUSCULAR | Status: DC | PRN

## 2021-09-08 MED ORDER — CHLORHEXIDINE GLUCONATE CLOTH 2 % EX PADS
6.0000 | MEDICATED_PAD | Freq: Every day | CUTANEOUS | Status: DC
  Administered 2021-09-08 – 2021-09-17 (×8): 6 via TOPICAL

## 2021-09-08 MED ORDER — EPHEDRINE SULFATE 50 MG/ML IJ SOLN
INTRAMUSCULAR | Status: DC | PRN
  Administered 2021-09-08: 5 mg via INTRAVENOUS

## 2021-09-08 MED ORDER — FLEET ENEMA 7-19 GM/118ML RE ENEM: 1.0000 | ENEMA | Freq: Once | RECTAL | Status: DC | PRN

## 2021-09-08 MED ORDER — OXYCODONE HCL 5 MG PO TABS: 5.0000 mg | ORAL_TABLET | ORAL | Status: DC | PRN

## 2021-09-08 MED ORDER — SODIUM CHLORIDE 0.9 % IV SOLN
INTRAVENOUS | Status: DC | PRN
  Administered 2021-09-08: 40 ug/min via INTRAVENOUS

## 2021-09-08 MED ORDER — METHOCARBAMOL 500 MG PO TABS: 500.0000 mg | ORAL_TABLET | Freq: Four times a day (QID) | ORAL | Status: DC | PRN

## 2021-09-08 MED ORDER — SODIUM CHLORIDE 0.9 % IR SOLN
Status: DC | PRN
  Administered 2021-09-08: 500 mL

## 2021-09-08 MED ORDER — TRANEXAMIC ACID 1000 MG/10ML IV SOLN
INTRAVENOUS | Status: AC
  Filled 2021-09-08: qty 10

## 2021-09-08 MED ORDER — SENNOSIDES-DOCUSATE SODIUM 8.6-50 MG PO TABS: 1.0000 | ORAL_TABLET | Freq: Every evening | ORAL | Status: DC | PRN

## 2021-09-08 MED ORDER — BUPIVACAINE HCL (PF) 0.5 % IJ SOLN
INTRAMUSCULAR | Status: DC | PRN
  Administered 2021-09-08: 2.5 mL via INTRATHECAL

## 2021-09-08 MED ORDER — FENTANYL CITRATE (PF) 100 MCG/2ML IJ SOLN
INTRAMUSCULAR | Status: DC | PRN
  Administered 2021-09-08: 12.5 ug via INTRAVENOUS
  Administered 2021-09-08: 25 ug via INTRAVENOUS
  Administered 2021-09-08: 12.5 ug via INTRAVENOUS
  Administered 2021-09-08: 25 ug via INTRAVENOUS

## 2021-09-08 MED ORDER — ONDANSETRON HCL 4 MG PO TABS: 4.0000 mg | ORAL_TABLET | Freq: Four times a day (QID) | ORAL | Status: DC | PRN

## 2021-09-08 MED ORDER — BISACODYL 10 MG RE SUPP: 10.0000 mg | Freq: Every day | RECTAL | Status: DC | PRN

## 2021-09-08 MED ORDER — BUPIVACAINE LIPOSOME 1.3 % IJ SUSP
INTRAMUSCULAR | Status: DC | PRN
  Administered 2021-09-08: 50 mL

## 2021-09-08 MED ORDER — CEFAZOLIN SODIUM-DEXTROSE 2-4 GM/100ML-% IV SOLN
2.0000 g | Freq: Four times a day (QID) | INTRAVENOUS | Status: AC
Start: 2021-09-08 — End: 2021-09-09
  Administered 2021-09-08 – 2021-09-09 (×3): 2 g via INTRAVENOUS
  Filled 2021-09-08 (×3): qty 100

## 2021-09-08 MED ORDER — DOCUSATE SODIUM 100 MG PO CAPS
100.0000 mg | ORAL_CAPSULE | Freq: Two times a day (BID) | ORAL | Status: DC
  Administered 2021-09-08 – 2021-09-11 (×6): 100 mg via ORAL
  Filled 2021-09-08 (×6): qty 1

## 2021-09-08 SURGICAL SUPPLY — 48 items
BIT DRILL INTERTAN LAG SCREW (BIT) ×2 IMPLANT
BIT DRILL LONG 4.0 (BIT) ×1 IMPLANT
BLADE SURG 15 STRL LF DISP TIS (BLADE) ×1 IMPLANT
BLADE SURG 15 STRL SS (BLADE) ×1
CHLORAPREP W/TINT 26 (MISCELLANEOUS) ×2 IMPLANT
DRAPE 3/4 80X56 (DRAPES) ×2 IMPLANT
DRAPE SURG 17X11 SM STRL (DRAPES) ×4 IMPLANT
DRAPE U-SHAPE 47X51 STRL (DRAPES) ×4 IMPLANT
DRILL BIT LONG 4.0 (BIT) ×2
DRSG OPSITE POSTOP 3X4 (GAUZE/BANDAGES/DRESSINGS) ×2 IMPLANT
DRSG OPSITE POSTOP 4X6 (GAUZE/BANDAGES/DRESSINGS) ×2 IMPLANT
ELECT REM PT RETURN 9FT ADLT (ELECTROSURGICAL) ×2
ELECTRODE REM PT RTRN 9FT ADLT (ELECTROSURGICAL) ×1 IMPLANT
GAUZE 4X4 16PLY ~~LOC~~+RFID DBL (SPONGE) ×2 IMPLANT
GLOVE SRG 8 PF TXTR STRL LF DI (GLOVE) ×1 IMPLANT
GLOVE SURG SYN 7.5  E (GLOVE) ×1
GLOVE SURG SYN 7.5 E (GLOVE) ×1 IMPLANT
GLOVE SURG UNDER POLY LF SZ8 (GLOVE) ×1
GOWN STRL REUS W/ TWL LRG LVL3 (GOWN DISPOSABLE) ×1 IMPLANT
GOWN STRL REUS W/ TWL XL LVL3 (GOWN DISPOSABLE) ×1 IMPLANT
GOWN STRL REUS W/TWL LRG LVL3 (GOWN DISPOSABLE) ×1
GOWN STRL REUS W/TWL XL LVL3 (GOWN DISPOSABLE) ×1
GUIDE PIN 3.2X343 (PIN) ×2
GUIDE PIN 3.2X343MM (PIN) ×2
KIT PATIENT CARE HANA TABLE (KITS) ×2 IMPLANT
KIT TURNOVER KIT A (KITS) ×2 IMPLANT
MANIFOLD NEPTUNE II (INSTRUMENTS) ×2 IMPLANT
MAT ABSORB  FLUID 56X50 GRAY (MISCELLANEOUS) ×2
MAT ABSORB FLUID 56X50 GRAY (MISCELLANEOUS) ×2 IMPLANT
NAIL TRIGEN INTERTAN 10X18CM (Nail) ×2 IMPLANT
NEEDLE FILTER BLUNT 18X 1/2SAF (NEEDLE) ×1
NEEDLE FILTER BLUNT 18X1 1/2 (NEEDLE) ×1 IMPLANT
NEEDLE HYPO 22GX1.5 SAFETY (NEEDLE) ×2 IMPLANT
NS IRRIG 1000ML POUR BTL (IV SOLUTION) ×2 IMPLANT
NS IRRIG 500ML POUR BTL (IV SOLUTION) ×2 IMPLANT
PACK HIP COMPR (MISCELLANEOUS) ×2 IMPLANT
PENCIL ELECTRO HAND CTR (MISCELLANEOUS) ×2 IMPLANT
PENCIL SMOKE EVACUATOR (MISCELLANEOUS) ×2 IMPLANT
PIN GUIDE 3.2X343MM (PIN) ×2 IMPLANT
SCREW LAG COMPR KIT 90/85 (Screw) ×2 IMPLANT
SCREW TRIGEN LOW PROF 5.0X30 (Screw) ×2 IMPLANT
SPONGE T-LAP 18X18 ~~LOC~~+RFID (SPONGE) ×4 IMPLANT
STAPLER SKIN PROX 35W (STAPLE) ×2 IMPLANT
SUT VIC AB 2-0 CT2 27 (SUTURE) ×2 IMPLANT
SYR 10ML LL (SYRINGE) ×2 IMPLANT
SYR 30ML LL (SYRINGE) ×2 IMPLANT
TAPE CLOTH 3X10 WHT NS LF (GAUZE/BANDAGES/DRESSINGS) ×4 IMPLANT
WATER STERILE IRR 500ML POUR (IV SOLUTION) IMPLANT

## 2021-09-08 NOTE — H&P (Signed)
H&P reviewed. No significant changes noted.  

## 2021-09-08 NOTE — Anesthesia Procedure Notes (Signed)
Spinal  Patient location during procedure: OR Start time: 09/08/2021 2:00 PM End time: 09/08/2021 2:08 PM Reason for block: surgical anesthesia Staffing Performed: anesthesiologist  Anesthesiologist: , , MD Preanesthetic Checklist Completed: patient identified, IV checked, site marked, risks and benefits discussed, surgical consent, monitors and equipment checked, pre-op evaluation and timeout performed Spinal Block Patient position: right lateral decubitus Prep: ChloraPrep Patient monitoring: heart rate, continuous pulse ox, blood pressure and cardiac monitor Approach: midline Location: L3-4 Injection technique: single-shot Needle Needle type: Quincke  Needle gauge: 22 G Needle length: 9 cm Assessment Sensory level: T10 Events: CSF return Additional Notes Negative paresthesia. Negative blood return. Positive free-flowing CSF. Expiration date of kit checked and confirmed. Patient tolerated procedure well, without complications.      

## 2021-09-08 NOTE — Progress Notes (Signed)
Patient ID: Autumn Johnston, female   DOB: [birth year], 87 y.o.   MRN: [id number] [facility] PROGRESS NOTE  Loyd Salvador PIR:[id number] DOB: 07/05/1922 DOA: 09/07/2021 PCP: Idelle Crouch, MD  HPI/Subjective: Patient had a full and cannot tell the details about the fall.  Not really complaining of too much pain.  No shortness of breath or cough.  A little bit of runny nose.  Found to be COVID-positive.  Objective: Vitals:   09/08/21 0900 09/08/21 1130  BP: (!) 165/77 130/88  Pulse: 89 90  Resp: 13 20  Temp:    SpO2: 100% 98%    Intake/Output Summary (Last 24 hours) at 09/08/2021 1240 Last data filed at 09/08/2021 0841 Gross per 24 hour  Intake 549.2 ml  Output --  Net 549.2 ml   Filed Weights   09/07/21 2018  Weight: 82.1 kg    ROS: Review of Systems  Respiratory:  Negative for shortness of breath.   Cardiovascular:  Negative for chest pain.  Gastrointestinal:  Negative for abdominal pain, nausea and vomiting.  Musculoskeletal:  Positive for joint pain.  Exam: Physical Exam HENT:     Head: Normocephalic.     Mouth/Throat:     Pharynx: No oropharyngeal exudate.  Eyes:     General: Lids are normal.     Conjunctiva/sclera: Conjunctivae normal.  Cardiovascular:     Rate and Rhythm: Normal rate and regular rhythm.     Heart sounds: Normal heart sounds, S1 normal and S2 normal.  Pulmonary:     Breath sounds: Examination of the right-lower field reveals decreased breath sounds. Examination of the left-lower field reveals decreased breath sounds. Decreased breath sounds present. No wheezing, rhonchi or rales.  Abdominal:     Palpations: Abdomen is soft.     Tenderness: There is no abdominal tenderness.  Musculoskeletal:     Right lower leg: No swelling.     Left lower leg: No swelling.  Skin:    General: Skin is warm.     Findings: No rash.  Neurological:     Mental Status: She is alert.     Comments: Answer some questions.  Left leg shortened and  externally rotated      Scheduled Meds:  metoprolol tartrate  25 mg Oral BID   pantoprazole (PROTONIX) IV  40 mg Intravenous Q12H   sertraline  50 mg Oral Daily   simvastatin  20 mg Oral QHS   sodium chloride flush  3 mL Intravenous Q12H   Continuous Infusions:  sodium chloride 40 mL/hr at 09/08/21 0840    ceFAZolin (ANCEF) IV Stopped (09/08/21 1011)    Assessment/Plan:  Preop evaluation for left hip fracture requiring operative repair.  Likely will go to the operating room today.  No contraindications to surgery at this time. COVID-19 infection.  Patient is not the best historian so hard to tell if she is having a lot of symptoms or not.  Chest x-ray negative for pneumonia.  Will consider Paxlovid after surgery. Anemia of chronic disease.  Hemoglobin 8.1 today.  May end up needing a blood transfusion during the hospital course. Chronic kidney disease stage IIIb.  Continue to monitor Hyperlipidemia on Zocor Suspect some underlying dementia.  On Seroquel at night and Zoloft during the day Essential hypertension on metoprolol.  Holding other BP meds.        Code Status:     Code Status Orders  (From admission, onward)           Start  Ordered   09/07/21 2304  Do not attempt resuscitation (DNR)  Continuous       Question Answer Comment  In the event of cardiac or respiratory ARREST Do not call a "code blue"   In the event of cardiac or respiratory ARREST Do not perform Intubation, CPR, defibrillation or ACLS   In the event of cardiac or respiratory ARREST Use medication by any route, position, wound care, and other measures to relive pain and suffering. May use oxygen, suction and manual treatment of airway obstruction as needed for comfort.      09/07/21 2310           Code Status History     Date Active Date Inactive Code Status Order ID Comments User Context   07/30/2021 1027 08/01/2021 1834 DNR [id number]  Sharen Hones, MD Inpatient   07/28/2021 0122  07/30/2021 1027 Full Code [id number]  Athena Masse, MD ED      Family Communication: Spoke with husband on the phone Disposition Plan: Status is: Inpatient  Dispo: The patient is from: Home              Anticipated d/c is to: Rehab After COVID isolation              Patient currently to go to the operating room today   Difficult to place patient.  No.  Consultants: Orthopedic surgery  Time spent: 27 minutes  Goodnight

## 2021-09-08 NOTE — ED Notes (Signed)
Olivia RN aware of assigned bed 

## 2021-09-08 NOTE — Transfer of Care (Signed)
Immediate Anesthesia Transfer of Care Note  Patient: Autumn Johnston  Procedure(s) Performed: INTRAMEDULLARY (IM) NAIL INTERTROCHANTRIC (Left: Hip)  Patient Location: Nursing Unit  Anesthesia Type:Spinal  Level of Consciousness: awake, alert  and oriented  Airway & Oxygen Therapy: Patient Spontanous Breathing  Post-op Assessment: Report given to RN and Post -op Vital signs reviewed and stable  Post vital signs: Reviewed and stable  Last Vitals:  Vitals Value Taken Time  BP 144/70 09/08/21 1548  Temp 35.8 C 09/08/21 1548  Pulse 78 09/08/21 1548  Resp 20 09/08/21 1548  SpO2 100 % 09/08/21 1548    Last Pain:  Vitals:   09/08/21 1333  TempSrc: Oral  PainSc:          Complications: No notable events documented.

## 2021-09-08 NOTE — Anesthesia Postprocedure Evaluation (Signed)
Anesthesia Post Note  Patient: Autumn Johnston  Procedure(s) Performed: INTRAMEDULLARY (IM) NAIL INTERTROCHANTRIC (Left: Hip)  Patient location during evaluation: PACU Anesthesia Type: Spinal Level of consciousness: oriented and awake and alert Pain management: pain level controlled Vital Signs Assessment: post-procedure vital signs reviewed and stable Respiratory status: spontaneous breathing and respiratory function stable Cardiovascular status: blood pressure returned to baseline and stable Postop Assessment: no headache, no backache and no apparent nausea or vomiting Anesthetic complications: no   No notable events documented.   Last Vitals:  Vitals:   09/08/21 1333 09/08/21 1548  BP: (!) 186/69 (!) 144/70  Pulse: 96 78  Resp: 18 20  Temp: 36.9 C (!) 35.8 C  SpO2: 98% 100%    Last Pain:  Vitals:   09/08/21 1550  TempSrc:   PainSc: 2                  Iran Ouch

## 2021-09-08 NOTE — Anesthesia Preprocedure Evaluation (Addendum)
Anesthesia Evaluation  Patient identified by MRN, date of birth, ID band Patient confused    Reviewed: Allergy & Precautions, NPO status , Patient's Chart, lab work & pertinent test results  History of Anesthesia Complications Negative for: history of anesthetic complications  Airway Mallampati: III  TM Distance: >3 FB Neck ROM: Full    Dental  (+) Poor Dentition, Missing, Chipped   Pulmonary neg sleep apnea, neg COPD, Patient abstained from smoking.Not current smoker,  Covid +, not obviously symptomatic   Pulmonary exam normal breath sounds clear to auscultation       Cardiovascular Exercise Tolerance: Poor METShypertension, (-) CAD and (-) Past MI (-) dysrhythmias  Rhythm:Regular Rate:Normal - Systolic murmurs    Neuro/Psych PSYCHIATRIC DISORDERS Possible dementianegative neurological ROS     GI/Hepatic neg GERD  ,(+)     (-) substance abuse  ,   Endo/Other  neg diabetes  Renal/GU CRFRenal disease     Musculoskeletal   Abdominal   Peds  Hematology  (+) anemia ,   Anesthesia Other Findings Past Medical History: No date: CKD (chronic kidney disease) stage 4, GFR 15-29 ml/min (HCC) No date: Colon cancer ([place]) No date: Hypertension  Reproductive/Obstetrics                            Anesthesia Physical Anesthesia Plan  ASA: 3  Anesthesia Plan: Spinal   Post-op Pain Management:    Induction: Intravenous  PONV Risk Score and Plan: 2 and Ondansetron, Dexamethasone, Propofol infusion, TIVA and Treatment may vary due to age or medical condition  Airway Management Planned: Natural Airway  Additional Equipment: None  Intra-op Plan:   Post-operative Plan:   Informed Consent: I have reviewed the patients History and Physical, chart, labs and discussed the procedure including the risks, benefits and alternatives for the proposed anesthesia with the patient or authorized  representative who has indicated his/her understanding and acceptance.   Patient has DNR.  Discussed DNR with power of attorney and Suspend DNR.   Consent reviewed with POA  Plan Discussed with: CRNA and Surgeon  Anesthesia Plan Comments: (Discussed R/B/A of neuraxial anesthesia technique with patient's spouse Nataleigh Griffin via phone due to patient's baseline confusion: - rare risks of spinal/epidural hematoma, nerve damage, infection - Risk of PDPH - Risk of nausea and vomiting - Risk of conversion to general anesthesia and its associated risks, including sore throat, damage to lips/teeth/oropharynx, and rare risks such as cardiac and respiratory events. - Risk of allergic reactions counseled on being higher risk for anesthesia due to comorbidities: Covid+, age. Patient was told about increased risk of cardiac and respiratory events, including death. Husband understands and had a clear understanding of the situation. Thinks that suspending DNR perioperatively is in patient's wishes.)        Anesthesia Quick Evaluation

## 2021-09-08 NOTE — ED Notes (Signed)
Phlebotomist at bedside.

## 2021-09-08 NOTE — ED Notes (Signed)
Attending MD at bedside.

## 2021-09-08 NOTE — Plan of Care (Signed)

## 2021-09-08 NOTE — Consult Note (Addendum)
ORTHOPAEDIC CONSULTATION  REQUESTING PHYSICIAN: Loletha Grayer, MD  Chief Complaint:   L hip pain  History of Present Illness: History obtained from patient, husband, daughter, review of chart, and discussion with medical providers.   Of note, patient tested positive for COVID in the ED.  Autumn Johnston is a 87 y.o. female who had a fall yesterday in her kitchen.  The patient noted immediate hip pain and inability to ambulate.  The patient ambulates with a walker or cane most of the time.  The patient lives at home with her husband. Pain is worse with any sort of movement.  X-rays in the emergency department show a left intertrochanteric hip fracture.  Patient has a medical history significant for CKD, history of colon cancer s/p colectomy with current colostomy and hypertension. She was also admitted recently in August 2022 for hyponatremia and UTI. She has baseline low Hg of 8.1 with anemia workup pending.   Past Medical History:  Diagnosis Date   CKD (chronic kidney disease) stage 4, GFR 15-29 ml/min (HCC)    Colon cancer (HCC)    Hypertension    Past Surgical History:  Procedure Laterality Date   COLECTOMY WITH COLOSTOMY CREATION/HARTMANN PROCEDURE     Social History   Socioeconomic History   Marital status: Married    Spouse name: Not on file   Number of children: Not on file   Years of education: Not on file   Highest education level: Not on file  Occupational History   Not on file  Tobacco Use   Smoking status: Never   Smokeless tobacco: Never  Substance and Sexual Activity   Alcohol use: Never   Drug use: Never   Sexual activity: Not on file  Other Topics Concern   Not on file  Social History Narrative   Not on file   Social Determinants of Health   Financial Resource Strain: Not on file  Food Insecurity: Not on file  Transportation Needs: Not on file  Physical Activity: Not on file   Stress: Not on file  Social Connections: Not on file   No family history on file. No Known Allergies Prior to Admission medications   Medication Sig Start Date End Date Taking? Authorizing Provider  ALPRAZolam (XANAX) 0.25 MG tablet Take 1 tablet (0.25 mg total) by mouth 2 (two) times daily as needed. 08/01/21  Yes Sharen Hones, MD  amLODipine (NORVASC) 10 MG tablet Take 10 mg by mouth daily.   Yes [provider]  aspirin EC 81 MG EC tablet Take 1 tablet (81 mg total) by mouth daily. Swallow whole. 08/02/21  Yes Sharen Hones, MD  metoprolol tartrate (LOPRESSOR) 25 MG tablet Take 1 tablet (25 mg total) by mouth 2 (two) times daily. 08/01/21  Yes Sharen Hones, MD  sertraline (ZOLOFT) 50 MG tablet Take 50 mg by mouth daily. 05/24/21  Yes [provider]  simvastatin (ZOCOR) 20 MG tablet Take 20 mg by mouth at bedtime. 05/24/21  Yes [provider]  zolpidem (AMBIEN) 10 MG tablet Take 10 mg by mouth at bedtime as needed. 08/22/21  Yes [provider]  feeding supplement (ENSURE ENLIVE / ENSURE PLUS) LIQD Take 237 mLs by mouth 3 (three) times daily between meals. 08/01/21   Sharen Hones, MD  QUEtiapine (SEROQUEL) 25 MG tablet Take 1 tablet (25 mg total) by mouth at bedtime. Patient not taking: Reported on 09/07/2021 08/01/21   Sharen Hones, MD   Recent Labs    09/07/21 2027 09/08/21 3267  WBC 7.5 7.0  HGB 8.6* 8.1*  HCT 26.1* 24.9*  PLT 245 258  K 4.4 4.3  CL 101 101  CO2 23 20*  BUN 39* 38*  CREATININE 1.55* 1.50*  GLUCOSE 123* 148*  CALCIUM 8.5* 8.5*  INR 1.1 1.0   DG Chest Portable 1 View  Result Date: 09/07/2021 CLINICAL DATA:  Pain after a fall. EXAM: PORTABLE CHEST 1 VIEW COMPARISON:  07/27/2021 FINDINGS: Cardiac enlargement. No vascular congestion, edema, or consolidation in the lungs. Emphysematous changes. Peribronchial thickening consistent with chronic bronchitis. No pleural effusions. No pneumothorax. Calcification of the aorta. Similar  appearance to previous study. IMPRESSION: Emphysematous and chronic bronchitic changes in the lungs. No evidence of active pulmonary disease. Electronically Signed   By: Lucienne Capers M.D.   On: 09/07/2021 21:53   DG Hip Unilat W or Wo Pelvis 2-3 Views Left  Result Date: 09/07/2021 CLINICAL DATA:  Left hip pain EXAM: DG HIP (WITH OR WITHOUT PELVIS) 2-3V LEFT COMPARISON:  None. FINDINGS: There is a minimally displaced intertrochanteric fracture of the proximal left femur. IMPRESSION: Minimally displaced intertrochanteric fracture of the proximal left femur. Electronically Signed   By: Ulyses Jarred M.D.   On: 09/07/2021 22:05     Positive ROS: All other systems have been reviewed and were otherwise negative with the exception of those mentioned in the HPI and as above.  Physical Exam: BP (!) 165/77   Pulse 89   Temp 98 F (36.7 C) (Oral)   Resp 13   Ht 5\' 3"  (1.6 m)   Wt 82.1 kg   SpO2 100%   BMI 32.06 kg/m  General:  Alert, no acute distress Psychiatric:  Patient is competent for consent with normal mood and affect   Cardiovascular:  No pedal edema, regular rate and rhythm Respiratory:  No wheezing, non-labored breathing GI:  Abdomen is soft and non-tender Skin:  No lesions in the area of chief complaint, no erythema Neurologic:  Sensation intact distally, CN grossly intact Lymphatic:  No axillary or cervical lymphadenopathy  Orthopedic Exam:  LLE: + DF/PF/EHL SILT grossly over foot Foot wwp +Log roll/axial load   X-rays:  As above: L intertrochanteric hip fracture  Autumn Johnston is a 87 y.o. female with a L intertrochanteric hip fracture   1. I discussed the various treatment options including both surgical and non-surgical management of the fracture with the patient and/or family (medical PoA). We discussed the high risk of perioperative complications due to patient's age and other co-morbidities. After discussion of risks, benefits, and alternatives to surgery,  the family and/or patient were in agreement to proceed with surgery. The goals of surgery would be to provide adequate pain relief and allow for mobilization. Plan for surgery is L hip cephalomedullary nailing today, 09/08/2021. 2. NPO until OR 3. Hold anticoagulation in advance of OR      Leim Fabry   09/08/2021 11:19 AM

## 2021-09-08 NOTE — Op Note (Signed)
DATE OF SURGERY: 09/08/2021  PREOPERATIVE DIAGNOSIS: Left intertrochanteric hip fracture  POSTOPERATIVE DIAGNOSIS: Left intertrochanteric hip fracture  PROCEDURE: Intramedullary nailing of Left femur with cephalomedullary device  SURGEON: Cato Mulligan, MD  ANESTHESIA: spinal  EBL: 100 cc  IVF: per anesthesia record  COMPONENTS:  [employer] Trigen Intertan Short Nail: 10x110mm; 53mm lag screw with 32mm compression screw; 5x 60mm distal cortical interlocking screw  INDICATIONS: Autumn Johnston is a 87 y.o. female who sustained an intertrochanteric fracture after a fall. Risks and benefits of intramedullary nailing were explained to the patient and/or family . Risks include but are not limited to bleeding, infection, injury to tissues, nerves, vessels, nonunion/malunion, hardware failure, limb length discrepancy/hip rotation mismatch and risks of anesthesia. The patient and/or family understand these risks, have completed an informed consent, and wish to proceed.   PROCEDURE:  The patient was brought into the operating room. After administering anesthesia, the patient was placed in the supine position on the Hana table. The uninjured leg was placed in an extended position while the injured lower extremity was placed in longitudinal traction. The fracture was reduced using longitudinal traction and internal rotation. The adequacy of reduction was verified fluoroscopically in AP and lateral projections and found to be acceptable. The lateral aspect of the hip and thigh were prepped with ChloraPrep solution before being draped sterilely. Preoperative IV antibiotics were administered. A timeout was performed to verify the appropriate surgical site, patient, and procedure.    The greater trochanter was identified and an approximately 6 cm incision was made about 3 fingerbreadths above the tip of the greater trochanter. The incision was carried down through the subcutaneous tissues to expose  the gluteal fascia. This was split the length of the incision, providing access to the tip of the trochanter. Under fluoroscopic guidance, a guidewire was drilled through the tip of the trochanter into the proximal metaphysis to the level of the lesser trochanter. After verifying its position fluoroscopically in AP and lateral projections, it was overreamed with the opening reamer to the level of the lesser trochanter. The nail was selected and advanced to the appropriate depth as verified fluoroscopically.    The guide system for the lag screw was positioned and advanced through an approximately 5cm incision over the lateral aspect of the proximal femur. The guidewire was drilled up through the femoral nail and into the femoral neck to rest within 5 mm of subchondral bone. After verifying its position in the femoral neck and head in both AP and lateral projections, the guidewire was measured and appropriate sized lag screw was selected.  The channel for the compression screw was drilled and antirotation bar was placed.  Lag screw was drilled and placed in appropriate position.  Compression screw was then placed.  Appropriate compression was achieved.  The set screw was locked in place. Again, the adequacy of hardware position and fracture reduction was verified fluoroscopically in AP and lateral projections.   Attention was then turned to the distal interlocking screw in the diaphysis. Using a targeted assembly, a stab incision was made and hole was drilled through the nail. An interlocking screw was placed with excellent purchase.  Appropriate screw position was verified fluoroscopically in AP and lateral projections.   The wounds were irrigated thoroughly with sterile saline solution. Local anesthetic was injected into the wounds. Deep fascia was closed with 0-Vicryl. The subcutaneous tissues were closed using 2-0 Vicryl interrupted sutures. The skin was closed using staples. Sterile occlusive dressings  were applied to all wounds. The patient was then transferred to the recovery room in satisfactory condition.   POSTOPERATIVE PLAN: The patient will be WBAT on the operative extremity. Lovenox '40mg'$ /day x 4 weeks to start on POD#1. Perioperative IV antibiotics x 24 hours. PT/OT on POD#1.

## 2021-09-08 NOTE — Addendum Note (Signed)
Addendum  created 09/08/21 2034 by Iran Ouch, MD   Review and Sign - Ready for Procedure

## 2021-09-09 ENCOUNTER — Encounter: Payer: Self-pay | Admitting: Orthopedic Surgery

## 2021-09-09 DIAGNOSIS — D62 Acute posthemorrhagic anemia: Secondary | ICD-10-CM

## 2021-09-09 DIAGNOSIS — S72002S Fracture of unspecified part of neck of left femur, sequela: Secondary | ICD-10-CM | POA: Diagnosis not present

## 2021-09-09 DIAGNOSIS — U071 COVID-19: Secondary | ICD-10-CM

## 2021-09-09 DIAGNOSIS — R1904 Left lower quadrant abdominal swelling, mass and lump: Secondary | ICD-10-CM | POA: Diagnosis not present

## 2021-09-09 DIAGNOSIS — I959 Hypotension, unspecified: Secondary | ICD-10-CM

## 2021-09-09 LAB — CBC
HCT: 17.3 % — ABNORMAL LOW (ref 36.0–46.0)
Hemoglobin: 5.5 g/dL — ABNORMAL LOW (ref 12.0–15.0)
MCH: 29.7 pg (ref 26.0–34.0)
MCHC: 31.8 g/dL (ref 30.0–36.0)
MCV: 93.5 fL (ref 80.0–100.0)
Platelets: 168 10*3/uL (ref 150–400)
RBC: 1.85 MIL/uL — ABNORMAL LOW (ref 3.87–5.11)
RDW: 14 % (ref 11.5–15.5)
WBC: 5 10*3/uL (ref 4.0–10.5)
nRBC: 0 % (ref 0.0–0.2)

## 2021-09-09 LAB — BASIC METABOLIC PANEL
Anion gap: 5 (ref 5–15)
BUN: 38 mg/dL — ABNORMAL HIGH (ref 8–23)
CO2: 22 mmol/L (ref 22–32)
Calcium: 7.5 mg/dL — ABNORMAL LOW (ref 8.9–10.3)
Chloride: 109 mmol/L (ref 98–111)
Creatinine, Ser: 1.48 mg/dL — ABNORMAL HIGH (ref 0.44–1.00)
GFR, Estimated: 32 mL/min — ABNORMAL LOW (ref >60)
Glucose, Bld: 120 mg/dL — ABNORMAL HIGH (ref 70–99)
Potassium: 4.7 mmol/L (ref 3.5–5.1)
Sodium: 136 mmol/L (ref 135–145)

## 2021-09-09 LAB — PREPARE RBC (CROSSMATCH)

## 2021-09-09 LAB — HEMOGLOBIN: Hemoglobin: 8.1 g/dL — ABNORMAL LOW (ref 12.0–15.0)

## 2021-09-09 MED ORDER — ENOXAPARIN SODIUM 40 MG/0.4ML IJ SOSY: 40.0000 mg | PREFILLED_SYRINGE | INTRAMUSCULAR | 0 refills | Status: DC

## 2021-09-09 MED ORDER — OXYCODONE HCL 5 MG PO TABS: 2.5000 mg | ORAL_TABLET | Freq: Four times a day (QID) | ORAL | 0 refills | Status: DC | PRN

## 2021-09-09 MED ORDER — SODIUM CHLORIDE 0.9% IV SOLUTION: Freq: Once | INTRAVENOUS | Status: AC

## 2021-09-09 MED ORDER — TRAMADOL HCL 50 MG PO TABS: 50.0000 mg | ORAL_TABLET | Freq: Four times a day (QID) | ORAL | 0 refills | Status: DC | PRN

## 2021-09-09 MED ORDER — POLYETHYLENE GLYCOL 3350 17 G PO PACK
17.0000 g | PACK | Freq: Two times a day (BID) | ORAL | Status: DC
  Administered 2021-09-09 – 2021-09-18 (×10): 17 g via ORAL
  Filled 2021-09-09 (×13): qty 1

## 2021-09-09 MED ORDER — POLYETHYLENE GLYCOL 3350 17 G PO PACK
17.0000 g | PACK | Freq: Every day | ORAL | Status: DC
  Administered 2021-09-09: 17 g via ORAL
  Filled 2021-09-09: qty 1

## 2021-09-09 NOTE — Progress Notes (Signed)
OT Cancellation Note  Patient Details Name: Autumn Johnston MRN: [id number] DOB: 11-23-1922   Cancelled Treatment:    Reason Eval/Treat Not Completed: Medical issues which prohibited therapy;Patient not medically ready. Consult received, chart reviewed. Pt's Hgb 5.5 and trending down falling outside guidelines for participation with OT services.  Will attempt to see pt at a future date/time as medically appropriate.   Ardeth Perfect., MPH, MS, OTR/L ascom [phone] 09/09/21, 8:51 AM

## 2021-09-09 NOTE — Progress Notes (Signed)
PT Cancellation Note  Patient Details Name: Marva Hendryx MRN: [id number] DOB: 07-Sep-1922   Cancelled Treatment:    Reason Eval/Treat Not Completed: Medical issues which prohibited therapy; Pt's Hgb 5.5 and trending down falling outside guidelines for participation with PT services.  Will attempt to see pt at a future date/time as medically appropriate.     Linus Salmons PT, DPT 09/09/21, 8:46 AM

## 2021-09-09 NOTE — Progress Notes (Signed)
  Subjective: 1 Day Post-Op Procedure(s) (LRB): INTRAMEDULLARY (IM) NAIL INTERTROCHANTRIC (Left) Patient reports pain as mild.   Patient is well, and has had no acute complaints or problems Plan is to go Rehab after hospital stay. Negative for chest pain and shortness of breath Fever: no Gastrointestinal:Negative for nausea and vomiting  Objective: Vital signs in last 24 hours: Temp:  [96.5 F (35.8 C)-98.5 F (36.9 C)] 98.5 F (36.9 C) (09/19 2217) Pulse Rate:  [78-96] 80 (09/19 2217) Resp:  [13-20] 17 (09/19 2217) BP: (102-186)/(44-88) 102/44 (09/19 2217) SpO2:  [98 %-100 %] 98 % (09/19 2217)  Intake/Output from previous day:  Intake/Output Summary (Last 24 hours) at 09/09/2021 0701 Last data filed at 09/09/2021 0420 Gross per 24 hour  Intake 1315.6 ml  Output 400 ml  Net 915.6 ml    Intake/Output this shift: No intake/output data recorded.  Labs: Recent Labs    09/07/21 2027 09/08/21 0813 09/09/21 0511  HGB 8.6* 8.1* 5.5*   Recent Labs    09/08/21 0813 09/09/21 0511  WBC 7.0 5.0  RBC 2.67* 1.85*  HCT 24.9* 17.3*  PLT 258 168   Recent Labs    09/08/21 0813 09/09/21 0511  NA 132* 136  K 4.3 4.7  CL 101 109  CO2 20* 22  BUN 38* 38*  CREATININE 1.50* 1.48*  GLUCOSE 148* 120*  CALCIUM 8.5* 7.5*   Recent Labs    09/07/21 2027 09/08/21 0813  INR 1.1 1.0     EXAM General - Patient is Alert and Confused Extremity - Sensation intact distally Dorsiflexion/Plantar flexion intact Compartment soft Dressing/Incision - clean, dry, scant and blood tinged drainage Motor Function - intact, moving foot and toes well on exam.   Past Medical History:  Diagnosis Date   CKD (chronic kidney disease) stage 4, GFR 15-29 ml/min (HCC)    Colon cancer (HCC)    Hypertension     Assessment/Plan: 1 Day Post-Op Procedure(s) (LRB): INTRAMEDULLARY (IM) NAIL INTERTROCHANTRIC (Left) Principal Problem:   Hip fracture (HCC) Active Problems:   Anemia of chronic  disease   Essential hypertension   Chronic kidney disease (CKD), stage IV (severe) ([place])   Fall at home, initial encounter  Estimated body mass index is 32.06 kg/m as calculated from the following:   Height as of this encounter: 5\' 3"  (1.6 m).   Weight as of this encounter: 82.1 kg. Advance diet Up with therapy  Acute blood loss anemia post surgical.  Hgb 5.5.  Await Internal Medicine transfusion  DVT Prophylaxis - Lovenox, Foot Pumps, and TED hose Weight-Bearing as tolerated to Left leg  Reche Dixon, PA-C Orthopaedic Surgery 09/09/2021, 7:01 AM

## 2021-09-09 NOTE — Progress Notes (Signed)
Patient ID: Autumn Johnston, female   DOB: Dec 07, 1922, 87 y.o.   MRN: [id number] [facility] PROGRESS NOTE  Tarshia Kot MBE:[id number] DOB: November 06, 1922 DOA: 09/07/2021 PCP: Idelle Crouch, MD  HPI/Subjective: Patient seen this morning and was refusing blood transfusion with her hemoglobin of 5.5.  I had to call the patient's husband and have him call into the room and speak with the patient and nurse.  Patient eventually went along with transfusion.  Patient did not complain of much pain.  Had hip repair surgery yesterday.  Patient very hard to focus this morning.  Stool was brown and hard as per nursing staff.  Objective: Vitals:   09/09/21 0838 09/09/21 1218  BP: (!) 92/45 (!) 122/53  Pulse: 67 69  Resp: 16 16  Temp: 98.2 F (36.8 C) 98.8 F (37.1 C)  SpO2: 98% 98%    Intake/Output Summary (Last 24 hours) at 09/09/2021 1544 Last data filed at 09/09/2021 1218 Gross per 24 hour  Intake 1327.57 ml  Output --  Net 1327.57 ml   Filed Weights   09/07/21 2018  Weight: 82.1 kg    ROS: Review of Systems  Unable to perform ROS: Acuity of condition  Respiratory:  Negative for shortness of breath.   Cardiovascular:  Negative for chest pain.  Gastrointestinal:  Negative for abdominal pain.  Musculoskeletal:  Positive for joint pain.  Exam: Physical Exam HENT:     Head: Normocephalic.     Mouth/Throat:     Pharynx: No oropharyngeal exudate.  Eyes:     General: Lids are normal.     Comments: Conjunctiva pale  Cardiovascular:     Rate and Rhythm: Normal rate and regular rhythm.     Heart sounds: Normal heart sounds, S1 normal and S2 normal.  Pulmonary:     Breath sounds: No decreased breath sounds, wheezing, rhonchi or rales.  Abdominal:     Palpations: Abdomen is soft.     Tenderness: There is no abdominal tenderness.     Comments: In the left lower quadrant some bulging of the abdomen and feeling which could be hard stool versus mass  Musculoskeletal:      Right lower leg: No swelling.     Left lower leg: No swelling.  Skin:    General: Skin is warm.     Findings: No rash.  Neurological:     Mental Status: She is alert.      Scheduled Meds:  acetaminophen  1,000 mg Oral Q8H   Chlorhexidine Gluconate Cloth  6 each Topical Daily   docusate sodium  100 mg Oral BID   enoxaparin (LOVENOX) injection  40 mg Subcutaneous Q24H   pantoprazole (PROTONIX) IV  40 mg Intravenous Q12H   polyethylene glycol  17 g Oral BID   QUEtiapine  12.5 mg Oral QHS   sertraline  50 mg Oral Daily   simvastatin  20 mg Oral QHS   Continuous Infusions:  methocarbamol (ROBAXIN) IV     Brief history.  87 year old female with history of colon cancer status post colostomy, chronic kidney disease stage IIIb, hypertension hyperlipidemia coming in after a fall and found to have a left hip fracture.  Patient had surgery on 09/08/2021.  Hemoglobin dropped down to 5.5 postoperatively and given 1 unit of packed red blood cells and hemoglobin came up to 8.1 this afternoon.  CT scan abdomen pelvis ordered for mass versus hard stools felt in the left lower quadrant.  Assessment/Plan:  Acute blood loss anemia with  surgery and dilution with IV fluids.  Patient has history of anemia of chronic disease.  Hemoglobin 5.5 this morning.  Received 1 unit of packed red blood cells and hemoglobin came up to 8.1 this afternoon.  Recheck hemoglobin tomorrow.  Discontinue IV fluids. Left hip fracture requiring operative repair on 09/08/2021 by Dr. Posey Pronto.  Patient had intramedullary nailing of the left femur with cephalomedullary device. COVID-19 infection asymptomatic.  She is not hypoxic.  Hold off on treatment right now. Hard stool versus mass in left colon.  With history of colon cancer we will get a CT scan of the abdomen and pelvis. Chronic kidney disease stage IIIb.  Creatinine 1.55 on presentation down to 1.48 today.  Baseline creatinine 1.28. Suspect underlying dementia on Seroquel at  night and Zoloft during the day Relative hypotension this morning holding metoprolol and other BP meds. Hyperlipidemia unspecified on Zocor        Code Status:     Code Status Orders  (From admission, onward)           Start     Ordered   09/07/21 2304  Do not attempt resuscitation (DNR)  Continuous       Question Answer Comment  In the event of cardiac or respiratory ARREST Do not call a "code blue"   In the event of cardiac or respiratory ARREST Do not perform Intubation, CPR, defibrillation or ACLS   In the event of cardiac or respiratory ARREST Use medication by any route, position, wound care, and other measures to relive pain and suffering. May use oxygen, suction and manual treatment of airway obstruction as needed for comfort.      09/07/21 2310           Code Status History     Date Active Date Inactive Code Status Order ID Comments User Context   07/30/2021 1027 08/01/2021 1834 DNR [id number]  Sharen Hones, MD Inpatient   07/28/2021 0122 07/30/2021 1027 Full Code [id number]  Athena Masse, MD ED      Family Communication: Spoke with husband on the phone this morning Disposition Plan: Status is: Inpatient  Dispo: The patient is from: Home              Anticipated d/c is to: Rehab              Patient currently being transfused a unit of packed red blood cells secondary to very low hemoglobin of 5.5 this morning   Difficult to place patient.  No.  Consultants: Orthopedic surgery  Procedures: Left hip repair  Time spent: 28 minutes, case discussed with nursing staff  Loletha Grayer  [facility]

## 2021-09-09 NOTE — Progress Notes (Signed)
Notified Hollace Hayward NP of HGB 5.5

## 2021-09-09 NOTE — Progress Notes (Signed)
Pt refusing blood transfusion at this time. Pt is slightly confused, RN attempted to reorient and educate, pt continuing to refuse. Daughter called twice with no answer. At this time I am unable to obtain consent to administer blood transfusion. MD notified.

## 2021-09-09 NOTE — Plan of Care (Signed)

## 2021-09-09 NOTE — TOC Progression Note (Signed)
Transition of Care [facility]) - Progression Note    Patient Details  Name: Markelle Asaro MRN: [id number] Date of Birth: [birth year]  Transition of Care [facility]) CM/SW [place], RN Phone Number: 09/09/2021, 11:30 AM  Clinical Narrative:   RNCM spoke with patient's daughter, at bedside and patient's spouse.  Patient will accept blood transfusion today.  Awaiting disposition recommendations at this time.  TOC contact informaiton given to daughter, TOC to follow to discharge.           Expected Discharge Plan and Services                                                 Social Determinants of Health (SDOH) Interventions    Readmission Risk Interventions No flowsheet data found.

## 2021-09-09 NOTE — Discharge Instructions (Signed)
INSTRUCTIONS AFTER Surgery  Remove items at home which could result in a fall. This includes throw rugs or furniture in walking pathways ICE to the affected joint every three hours while awake for 30 minutes at a time, for at least the first 3-5 days, and then as needed for pain and swelling.  Continue to use ice for pain and swelling. You may notice swelling that will progress down to the foot and ankle.  This is normal after surgery.  Elevate your leg when you are not up walking on it.   Continue to use the breathing machine you got in the hospital (incentive spirometer) which will help keep your temperature down.  It is common for your temperature to cycle up and down following surgery, especially at night when you are not up moving around and exerting yourself.  The breathing machine keeps your lungs expanded and your temperature down.   DIET:  As you were doing prior to hospitalization, we recommend a well-balanced diet.  DRESSING / WOUND CARE / SHOWERING  Dressing change as needed.  No showering.  Follow up in 2 weeks with [place] for xrays of the Left Hip and Staple removal.  ACTIVITY  Increase activity slowly as tolerated, but follow the weight bearing instructions below.   No driving for 6 weeks or until further direction given by your physician.  You cannot drive while taking narcotics.  No lifting or carrying greater than 10 lbs. until further directed by your surgeon. Avoid periods of inactivity such as sitting longer than an hour when not asleep. This helps prevent blood clots.  You may return to work once you are authorized by your doctor.     WEIGHT BEARING  WBAT   EXERCISES Gait training and ROM exercises.   CONSTIPATION  Constipation is defined medically as fewer than three stools per week and severe constipation as less than one stool per week.  Even if you have a regular bowel pattern at home, your normal regimen is likely to be disrupted due to  multiple reasons following surgery.  Combination of anesthesia, postoperative narcotics, change in appetite and fluid intake all can affect your bowels.   YOU MUST use at least one of the following options; they are listed in order of increasing strength to get the job done.  They are all available over the counter, and you may need to use some, POSSIBLY even all of these options:    Drink plenty of fluids (prune juice may be helpful) and high fiber foods Colace 100 mg by mouth twice a day  Senokot for constipation as directed and as needed Dulcolax (bisacodyl), take with full glass of water  Miralax (polyethylene glycol) once or twice a day as needed.  If you have tried all these things and are unable to have a bowel movement in the first 3-4 days after surgery call either your surgeon or your primary doctor.    If you experience loose stools or diarrhea, hold the medications until you stool forms back up.  If your symptoms do not get better within 1 week or if they get worse, check with your doctor.  If you experience "the worst abdominal pain ever" or develop nausea or vomiting, please contact the office immediately for further recommendations for treatment.   ITCHING:  If you experience itching with your medications, try taking only a single pain pill, or even half a pain pill at a time.  You can also use Benadryl over the  counter for itching or also to help with sleep.   TED HOSE STOCKINGS:  Use stockings on both legs until for at least 2 weeks or as directed by physician office. They may be removed at night for sleeping.  MEDICATIONS:  See your medication summary on the "After Visit Summary" that nursing will review with you.  You may have some home medications which will be placed on hold until you complete the course of blood thinner medication.  It is important for you to complete the blood thinner medication as prescribed.  PRECAUTIONS:  If you experience chest pain or shortness of  breath - call 911 immediately for transfer to the hospital emergency department.   If you develop a fever greater that 101 F, purulent drainage from wound, increased redness or drainage from wound, foul odor from the wound/dressing, or calf pain - CONTACT YOUR SURGEON.                                                   FOLLOW-UP APPOINTMENTS:  If you do not already have a post-op appointment, please call the office for an appointment to be seen by your surgeon.  Guidelines for how soon to be seen are listed in your "After Visit Summary", but are typically between 1-4 weeks after surgery.  OTHER INSTRUCTIONS:     MAKE SURE YOU:  Understand these instructions.  Get help right away if you are not doing well or get worse.    Thank you for letting us be a part of your medical care team.  It is a privilege we respect greatly.  We hope these instructions will help you stay on track for a fast and full recovery!

## 2021-09-10 ENCOUNTER — Inpatient Hospital Stay: Payer: Medicare Other

## 2021-09-10 DIAGNOSIS — S72142A Displaced intertrochanteric fracture of left femur, initial encounter for closed fracture: Principal | ICD-10-CM

## 2021-09-10 DIAGNOSIS — S72002S Fracture of unspecified part of neck of left femur, sequela: Secondary | ICD-10-CM | POA: Diagnosis not present

## 2021-09-10 DIAGNOSIS — U071 COVID-19: Secondary | ICD-10-CM | POA: Diagnosis not present

## 2021-09-10 DIAGNOSIS — D62 Acute posthemorrhagic anemia: Secondary | ICD-10-CM | POA: Diagnosis not present

## 2021-09-10 LAB — TYPE AND SCREEN
ABO/RH(D): A POS
Antibody Screen: NEGATIVE
Unit division: 0

## 2021-09-10 LAB — CBC
HCT: 22.9 % — ABNORMAL LOW (ref 36.0–46.0)
Hemoglobin: 7.8 g/dL — ABNORMAL LOW (ref 12.0–15.0)
MCH: 29.9 pg (ref 26.0–34.0)
MCHC: 34.1 g/dL (ref 30.0–36.0)
MCV: 87.7 fL (ref 80.0–100.0)
Platelets: 188 10*3/uL (ref 150–400)
RBC: 2.61 MIL/uL — ABNORMAL LOW (ref 3.87–5.11)
RDW: 15 % (ref 11.5–15.5)
WBC: 5.6 10*3/uL (ref 4.0–10.5)
nRBC: 0 % (ref 0.0–0.2)

## 2021-09-10 LAB — BASIC METABOLIC PANEL
Anion gap: 7 (ref 5–15)
BUN: 37 mg/dL — ABNORMAL HIGH (ref 8–23)
CO2: 22 mmol/L (ref 22–32)
Calcium: 7.7 mg/dL — ABNORMAL LOW (ref 8.9–10.3)
Chloride: 105 mmol/L (ref 98–111)
Creatinine, Ser: 1.6 mg/dL — ABNORMAL HIGH (ref 0.44–1.00)
GFR, Estimated: 29 mL/min — ABNORMAL LOW (ref >60)
Glucose, Bld: 86 mg/dL (ref 70–99)
Potassium: 4.3 mmol/L (ref 3.5–5.1)
Sodium: 134 mmol/L — ABNORMAL LOW (ref 135–145)

## 2021-09-10 LAB — BPAM RBC
Blood Product Expiration Date: 202209202359
ISSUE DATE / TIME: 202209200815
Unit Type and Rh: 600

## 2021-09-10 MED ORDER — SODIUM CHLORIDE 0.9 % IV SOLN: INTRAVENOUS | Status: AC

## 2021-09-10 MED ORDER — ENOXAPARIN SODIUM 30 MG/0.3ML IJ SOSY
30.0000 mg | PREFILLED_SYRINGE | INTRAMUSCULAR | Status: DC
  Administered 2021-09-11 – 2021-09-17 (×7): 30 mg via SUBCUTANEOUS
  Filled 2021-09-10 (×7): qty 0.3

## 2021-09-10 MED ORDER — LACTULOSE 10 GM/15ML PO SOLN
20.0000 g | Freq: Once | ORAL | Status: DC
  Filled 2021-09-10: qty 30

## 2021-09-10 MED ORDER — IOHEXOL 9 MG/ML PO SOLN
500.0000 mL | ORAL | Status: AC
  Administered 2021-09-10 (×2): 500 mL via ORAL

## 2021-09-10 MED ORDER — SODIUM CHLORIDE 0.9 % IV SOLN
300.0000 mg | Freq: Once | INTRAVENOUS | Status: AC
  Administered 2021-09-10: 300 mg via INTRAVENOUS
  Filled 2021-09-10: qty 300

## 2021-09-10 NOTE — Progress Notes (Signed)
PHARMACIST - PHYSICIAN COMMUNICATION  CONCERNING:  Enoxaparin (Lovenox) for DVT Prophylaxis    RECOMMENDATION: Patient was prescribed enoxaprin 40mg  q24 hours for VTE prophylaxis.   Filed Weights   09/07/21 2018  Weight: 82.1 kg (181 lb)    Body mass index is 32.06 kg/m.  Estimated Creatinine Clearance: 20.9 mL/min (A) (by C-G formula based on SCr of 1.6 mg/dL (H)).   Patient is candidate for enoxaparin 30mg  every 24 hours based on CrCl <37ml/min or Weight <45kg  DESCRIPTION: Pharmacy has adjusted enoxaparin dose per [facility] policy.  Patient is now receiving enoxaparin 30 mg every 24 hours    Pearla Dubonnet, PharmD Clinical Pharmacist  09/10/2021 2:13 PM

## 2021-09-10 NOTE — Evaluation (Signed)
Occupational Therapy Evaluation Patient Details Name: Autumn Johnston MRN: [id number] DOB: 09-Jan-1922 Today's Date: 09/10/2021   History of Present Illness 87 year old female with history of colon cancer status post colostomy, chronic kidney disease stage IIIb, hypertension hyperlipidemia coming in after a fall and found to have a left hip fracture.  S/p L hip IM nail on 09/08/2021.   Clinical Impression   Ms Trinidad was seen for OT evaluation this date. Prior to hospital admission, pt was MOD I for mobility and ADLs. Pt lives with husband in home c level entry. Pt presents to acute OT demonstrating impaired ADL performance and functional mobility 2/2 decreased activity tolerance, decreased LB access, and functional balance/strength deficits. RN in room start of session to deliver pain medication and examine colostomy bag - pt noted with hard stool and encouraged to sit up to address.   Pt currently requires MAX A don/doff LBD at bed level. CGA improving to SBA for seated UB tasks, 1 posterior LOB noted requiring MIN A to correct. Pt tolerated ~15 min sitting EOB while completing hair brushing, drinking contrast, and managing colostomy bag. CGA + RW sit<>stand -  requires BUE support and unable to advance to standing grooming tasks. Pt would benefit from skilled OT to address noted impairments to maximize safety and independence while minimizing falls risk and caregiver burden. Upon hospital discharge, recommend STR to maximize pt safety and return to PLOF.       Recommendations for follow up therapy are one component of a multi-disciplinary discharge planning process, led by the attending physician.  Recommendations may be updated based on patient status, additional functional criteria and insurance authorization.   Follow Up Recommendations  SNF    Equipment Recommendations  3 in 1 bedside commode    Recommendations for Other Services       Precautions / Restrictions  Precautions Precautions: Fall Restrictions Weight Bearing Restrictions: Yes LLE Weight Bearing: Weight bearing as tolerated      Mobility Bed Mobility Overal bed mobility: Needs Assistance Bed Mobility: Supine to Sit;Sit to Supine     Supine to sit: Mod assist;HOB elevated Sit to supine: Mod assist        Transfers Overall transfer level: Needs assistance Equipment used: Rolling walker (2 wheeled) Transfers: Sit to/from Stand Sit to Stand: Min assist         General transfer comment: assist to stabilize RW sit>stand and for eecentric control stand>sit    Balance Overall balance assessment: Needs assistance Sitting-balance support: No upper extremity supported;Feet supported Sitting balance-Leahy Scale: Fair     Standing balance support: Bilateral upper extremity supported Standing balance-Leahy Scale: Fair                             ADL either performed or assessed with clinical judgement   ADL Overall ADL's : Needs assistance/impaired                                       General ADL Comments: MAX A don/doff LBD at bed level. CGA improving to SBA for seated UB tasks, 1 posterior LOB noted requiring MIN A to correct. CGA + RW for ADL t/f, requires BUE support and unable to advance to standing grooming tasks      Pertinent Vitals/Pain Pain Assessment: Faces Faces Pain Scale: Hurts even more Pain Location: LLE with mobility Pain  Descriptors / Indicators: Discomfort;Grimacing;Operative site guarding Pain Intervention(s): Limited activity within patient's tolerance;Patient requesting pain meds-RN notified;RN gave pain meds during session     Hand Dominance     Extremity/Trunk Assessment Upper Extremity Assessment Upper Extremity Assessment: Overall WFL for tasks assessed   Lower Extremity Assessment Lower Extremity Assessment: Generalized weakness       Communication Communication Communication: HOH   Cognition  Arousal/Alertness: Awake/alert Behavior During Therapy: WFL for tasks assessed/performed Overall Cognitive Status: Difficult to assess                                 General Comments: follows commands with repetition (likely r/t [facility])   General Comments       Exercises Exercises: Other exercises Other Exercises Other Exercises: Pt edcuated re: OT role, DME recs, d/c recs, falls prevention, ECS Other Exercises: LBD, grooming, sup<>sit, sit<>stand, sitting/standing balance/tolerance, ~3 ft forward/backward mobility   Shoulder Instructions      Home Living Family/patient expects to be discharged to:: Private residence Living Arrangements: Spouse/significant other Available Help at Discharge: Family;Available 24 hours/day (96yo husband) Type of Home: House Home Access: Level entry     Home Layout:  (3-4 steps from living room to kitchen/bedrooms with B railings)               Home Equipment: Cane - single point;Walker - 2 wheels          Prior Functioning/Environment Level of Independence: Independent with assistive device(s)                 OT Problem List: Decreased strength;Decreased range of motion;Decreased activity tolerance;Impaired balance (sitting and/or standing);Decreased safety awareness      OT Treatment/Interventions: Therapeutic exercise;Self-care/ADL training;Energy conservation;DME and/or AE instruction;Therapeutic activities;Patient/family education;Balance training    OT Goals(Current goals can be found in the care plan section) Acute Rehab OT Goals Patient Stated Goal: to feel better then go home OT Goal Formulation: With patient Time For Goal Achievement: 09/24/21 Potential to Achieve Goals: Good ADL Goals Pt Will Perform Grooming: standing;with min guard assist (c LRAD PRN) Pt Will Perform Lower Body Dressing: sit to/from stand;with supervision (c LRAD PRN) Pt Will Transfer to Toilet: with supervision;ambulating;regular  height toilet (c LRAD PRN)  OT Frequency: Min 2X/week   Barriers to D/C: Decreased caregiver support          Co-evaluation              AM-PAC OT "6 Clicks" Daily Activity     Outcome Measure Help from another person eating meals?: A Little Help from another person taking care of personal grooming?: A Little Help from another person toileting, which includes using toliet, bedpan, or urinal?: A Little Help from another person bathing (including washing, rinsing, drying)?: A Lot Help from another person to put on and taking off regular upper body clothing?: A Little Help from another person to put on and taking off regular lower body clothing?: A Lot 6 Click Score: 16   End of Session Equipment Utilized During Treatment: Rolling walker Nurse Communication: Mobility status  Activity Tolerance: Patient tolerated treatment well Patient left: in bed;with call bell/phone within reach;with bed alarm set  OT Visit Diagnosis: Other abnormalities of gait and mobility (R26.89);Muscle weakness (generalized) (M62.81)                Time: 3614-4315 OT Time Calculation (min): 38 min Charges:  OT General Charges $OT Visit:  1 Visit OT Evaluation $OT Eval Low Complexity: 1 Low OT Treatments $Self Care/Home Management : 23-37 mins  Dessie Coma, M.S. OTR/L  09/10/21, 1:28 PM  ascom [phone]

## 2021-09-10 NOTE — Progress Notes (Signed)
  Subjective: 2 Days Post-Op Procedure(s) (LRB): INTRAMEDULLARY (IM) NAIL INTERTROCHANTRIC (Left) Patient reports pain as mild.   Patient is well, and has had no acute complaints or problems Plan is to go Rehab after hospital stay. Negative for chest pain and shortness of breath Fever: no Gastrointestinal:Negative for nausea and vomiting  Objective: Vital signs in last 24 hours: Temp:  [98 F (36.7 C)-98.8 F (37.1 C)] 98.2 F (36.8 C) (09/21 0554) Pulse Rate:  [67-79] 67 (09/21 0554) Resp:  [16-18] 16 (09/21 0554) BP: (92-145)/(45-57) 121/51 (09/21 0554) SpO2:  [97 %-99 %] 97 % (09/21 0554)  Intake/Output from previous day:  Intake/Output Summary (Last 24 hours) at 09/10/2021 0706 Last data filed at 09/09/2021 2022 Gross per 24 hour  Intake 470 ml  Output 350 ml  Net 120 ml    Intake/Output this shift: No intake/output data recorded.  Labs: Recent Labs    09/07/21 2027 09/08/21 0813 09/09/21 0511 09/09/21 1509 09/10/21 0517  HGB 8.6* 8.1* 5.5* 8.1* 7.8*   Recent Labs    09/09/21 0511 09/10/21 0517  WBC 5.0 5.6  RBC 1.85* 2.61*  HCT 17.3* 22.9*  PLT 168 188   Recent Labs    09/09/21 0511 09/10/21 0517  NA 136 134*  K 4.7 4.3  CL 109 105  CO2 22 22  BUN 38* 37*  CREATININE 1.48* 1.60*  GLUCOSE 120* 86  CALCIUM 7.5* 7.7*   Recent Labs    09/07/21 2027 09/08/21 0813  INR 1.1 1.0     EXAM General - Patient is Alert and Confused Extremity - Sensation intact distally Dorsiflexion/Plantar flexion intact Compartment soft Dressing/Incision - clean, dry, scant and blood tinged drainage Motor Function - intact, moving foot and toes well on exam.   Past Medical History:  Diagnosis Date   CKD (chronic kidney disease) stage 4, GFR 15-29 ml/min (HCC)    Colon cancer (HCC)    Hypertension     Assessment/Plan: 2 Days Post-Op Procedure(s) (LRB): INTRAMEDULLARY (IM) NAIL INTERTROCHANTRIC (Left) Principal Problem:   Closed hip fracture requiring  operative repair, left, sequela Active Problems:   Anemia of chronic disease   Essential hypertension   Chronic kidney disease (CKD), stage IV (severe) (HCC)   Fall at home, initial encounter   Acute blood loss anemia   COVID-19 virus infection   Left lower quadrant abdominal mass   Hypotension  Estimated body mass index is 32.06 kg/m as calculated from the following:   Height as of this encounter: 5\' 3"  (1.6 m).   Weight as of this encounter: 82.1 kg. Advance diet Up with therapy  Acute blood loss anemia post surgical.  Hgb 7.8.  Received 1 unit of transfused blood yesterday.  Vital stable.  DVT Prophylaxis - Lovenox, Foot Pumps, and TED hose Weight-Bearing as tolerated to Left leg  Reche Dixon, PA-C Orthopaedic Surgery 09/10/2021, 7:06 AM

## 2021-09-10 NOTE — Progress Notes (Signed)
PROGRESS NOTE    Autumn Johnston  ZGY:[id number] DOB: 1922-03-21 DOA: 09/07/2021 PCP: Idelle Crouch, MD    Brief Narrative:   87 year old female with history of colon cancer status post colostomy, chronic kidney disease stage IIIb, hypertension hyperlipidemia coming in after a fall and found to have a left hip fracture.  Patient had surgery on 09/08/2021.  Hemoglobin dropped down to 5.5 postoperatively and given 1 unit of packed red blood cells and hemoglobin came up to 8.1 this afternoon.  CT scan abdomen pelvis ordered for mass versus hard stools felt in the left lower quadrant.   Assessment & Plan:   Principal Problem:   Closed hip fracture requiring operative repair, left, sequela Active Problems:   Anemia of chronic disease   Essential hypertension   Chronic kidney disease (CKD), stage IV (severe) (HCC)   Fall at home, initial encounter   Acute blood loss anemia   COVID-19 virus infection   Left lower quadrant abdominal mass   Hypotension  #1.  Left hip fracture s/p repair 9/19. Acute blood loss anemia. Iron deficient anemia. Patient required 2 units PRBC yesterday, hemoglobin is better. Iron is low, will give IV iron today. Recheck a CBC tomorrow. Continue PT/OT, pending decision for placement.  2.  Asymptomatic COVID-19 infection. Hold off any treatment.  3.  Left sided abdominal mass. Constipation. Suspect fecal impaction versus renal mass. CT scan is pending. Give a dose of lactulose.  4.  Chronic kidney disease stage IIIb. Renal function started worse today probably due to poor p.o. intake. Give 500 mL of fluids.  5.  Relative hypotension. Blood pressure medicine on hold.    DVT prophylaxis: Lovenox Code Status: DNR Family Communication:  Disposition Plan:    Status is: Inpatient  Remains inpatient appropriate because:IV treatments appropriate due to intensity of illness or inability to take PO and Inpatient level of care appropriate due to  severity of illness  Dispo: The patient is from: Home              Anticipated d/c is to: SNF              Patient currently is not medically stable to d/c.   Difficult to place patient No        I/O last 3 completed shifts: In: 1327.6 [I.V.:757.6; Blood:470; IV Piggyback:100] Out: 350 [Urine:350] No intake/output data recorded.     Consultants:  Orthopedics  Procedures: Hip  Antimicrobials: None  Subjective: Patient still very weak, poor appetite. Denies any short of breath or cough. No abdominal pain nausea vomiting.  No recorded bowel movement since admission No dysuria hematuria. No fever or chills.  Objective: Vitals:   09/09/21 2019 09/10/21 0036 09/10/21 0554 09/10/21 0910  BP: (!) 145/56 (!) 102/46 (!) 121/51 (!) 159/71  Pulse: 79 71 67 76  Resp: 18 16 16 16   Temp: 98.8 F (37.1 C) 98.4 F (36.9 C) 98.2 F (36.8 C) 98.1 F (36.7 C)  TempSrc:  Oral Oral   SpO2: 99% 97% 97% 99%  Weight:      Height:        Intake/Output Summary (Last 24 hours) at 09/10/2021 1212 Last data filed at 09/09/2021 2022 Gross per 24 hour  Intake 470 ml  Output 350 ml  Net 120 ml   Filed Weights   09/07/21 2018  Weight: 82.1 kg    Examination:  General exam: Appears calm and comfortable  Respiratory system: Clear to auscultation. Respiratory effort normal. Cardiovascular system:  S1 & S2 heard, RRR. No JVD, murmurs, rubs, gallops or clicks. No pedal edema. Gastrointestinal system: Abdomen is nondistended, soft and nontender. No organomegaly or masses felt. Normal bowel sounds heard. Central nervous system: Alert and oriented x2.  No focal neurological deficits. Extremities: Symmetric 5 x 5 power. Skin: No rashes, lesions or ulcers Psychiatry: Mood & affect appropriate.     Data Reviewed: I have personally reviewed following labs and imaging studies  CBC: Recent Labs  Lab 09/07/21 2027 09/08/21 0813 09/09/21 0511 09/09/21 1509 09/10/21 0517  WBC 7.5 7.0  5.0  --  5.6  NEUTROABS 4.9  --   --   --   --   HGB 8.6* 8.1* 5.5* 8.1* 7.8*  HCT 26.1* 24.9* 17.3*  --  22.9*  MCV 93.2 93.3 93.5  --  87.7  PLT 245 258 168  --  010   Basic Metabolic Panel: Recent Labs  Lab 09/07/21 2027 09/08/21 0813 09/09/21 0511 09/10/21 0517  NA 133* 132* 136 134*  K 4.4 4.3 4.7 4.3  CL 101 101 109 105  CO2 23 20* 22 22  GLUCOSE 123* 148* 120* 86  BUN 39* 38* 38* 37*  CREATININE 1.55* 1.50* 1.48* 1.60*  CALCIUM 8.5* 8.5* 7.5* 7.7*   GFR: Estimated Creatinine Clearance: 20.9 mL/min (A) (by C-G formula based on SCr of 1.6 mg/dL (H)). Liver Function Tests: Recent Labs  Lab 09/07/21 2027  AST 23  ALT 13  ALKPHOS 60  BILITOT 0.7  PROT 6.2*  ALBUMIN 3.2*   No results for input(s): LIPASE, AMYLASE in the last 168 hours. No results for input(s): AMMONIA in the last 168 hours. Coagulation Profile: Recent Labs  Lab 09/07/21 2027 09/08/21 0813  INR 1.1 1.0   Cardiac Enzymes: No results for input(s): CKTOTAL, CKMB, CKMBINDEX, TROPONINI in the last 168 hours. BNP (last 3 results) No results for input(s): PROBNP in the last 8760 hours. HbA1C: No results for input(s): HGBA1C in the last 72 hours. CBG: No results for input(s): GLUCAP in the last 168 hours. Lipid Profile: No results for input(s): CHOL, HDL, LDLCALC, TRIG, CHOLHDL, LDLDIRECT in the last 72 hours. Thyroid Function Tests: No results for input(s): TSH, T4TOTAL, FREET4, T3FREE, THYROIDAB in the last 72 hours. Anemia Panel: Recent Labs    09/07/21 2027 09/08/21 0813  VITAMINB12  --  186  FOLATE 22.0  --   FERRITIN 202  --   TIBC 235*  --   IRON 16*  --   RETICCTPCT 1.6  --    Sepsis Labs: No results for input(s): PROCALCITON, LATICACIDVEN in the last 168 hours.  Recent Results (from the past 240 hour(s))  Resp Panel by RT-PCR (Flu A&B, Covid) Nasopharyngeal Swab     Status: Abnormal   Collection Time: 09/07/21 10:07 PM   Specimen: Nasopharyngeal Swab; Nasopharyngeal(NP)  swabs in vial transport medium  Result Value Ref Range Status   SARS Coronavirus 2 by RT PCR POSITIVE (A) NEGATIVE Final    Comment: RESULT CALLED TO, READ BACK BY AND VERIFIED WITH: ALYCIA BEVERLY @2310  ON 09/07/21 SK[facility] (NOTE) SARS-CoV-2 target nucleic acids are DETECTED.  The SARS-CoV-2 RNA is generally detectable in upper respiratory specimens during the acute phase of infection. Positive results are indicative of the presence of the identified virus, but do not rule out bacterial infection or co-infection with other pathogens not detected by the test. Clinical correlation with patient history and other diagnostic information is necessary to determine patient infection status. The expected result is  Negative.  Fact Sheet for Patients: EntrepreneurPulse.com.au  Fact Sheet for Healthcare Providers: IncredibleEmployment.be  This test is not yet approved or cleared by the [place] FDA and  has been authorized for detection and/or diagnosis of SARS-CoV-2 by FDA under an Emergency Use Authorization (EUA).  This EUA will remain in effect (meaning this test can  be used) for the duration of  the COVID-19 declaration under Section 564(b)(1) of the Act, 21 U.S.C. section 360bbb-3(b)(1), unless the authorization is terminated or revoked sooner.     Influenza A by PCR NEGATIVE NEGATIVE Final   Influenza B by PCR NEGATIVE NEGATIVE Final    Comment: (NOTE) The Xpert Xpress SARS-CoV-2/FLU/RSV plus assay is intended as an aid in the diagnosis of influenza from Nasopharyngeal swab specimens and should not be used as a sole basis for treatment. Nasal washings and aspirates are unacceptable for Xpert Xpress SARS-CoV-2/FLU/RSV testing.  Fact Sheet for Patients: EntrepreneurPulse.com.au  Fact Sheet for Healthcare Providers: IncredibleEmployment.be  This test is not yet approved or cleared by the [place] FDA  and has been authorized for detection and/or diagnosis of SARS-CoV-2 by FDA under an Emergency Use Authorization (EUA). This EUA will remain in effect (meaning this test can be used) for the duration of the COVID-19 declaration under Section 564(b)(1) of the Act, 21 U.S.C. section 360bbb-3(b)(1), unless the authorization is terminated or revoked.  Performed at [facility], [address], [place], [place] [address]          Radiology Studies: DG HIP OPERATIVE UNILAT W OR W/O PELVIS LEFT  Result Date: 09/08/2021 CLINICAL DATA:  Left hip IM nail. EXAM: OPERATIVE LEFT HIP (WITH PELVIS IF PERFORMED) TECHNIQUE: Fluoroscopic spot image(s) were submitted for interpretation post-operatively. COMPARISON:  Preoperative imaging yesterday. FINDINGS: Two fluoroscopic spot views of the left hip obtained in the operating room. Intramedullary nail with 2 trans trochanteric and single distal locking screw traverse intertrochanteric femur fracture. Fluoroscopy time 1 minutes 9 seconds. IMPRESSION: Fluoroscopic spot views for left hip intertrochanteric fracture fixation. Electronically Signed   By: Keith Rake M.D.   On: 09/08/2021 17:00        Scheduled Meds:  acetaminophen  1,000 mg Oral Q8H   Chlorhexidine Gluconate Cloth  6 each Topical Daily   docusate sodium  100 mg Oral BID   enoxaparin (LOVENOX) injection  40 mg Subcutaneous Q24H   pantoprazole (PROTONIX) IV  40 mg Intravenous Q12H   polyethylene glycol  17 g Oral BID   QUEtiapine  12.5 mg Oral QHS   sertraline  50 mg Oral Daily   simvastatin  20 mg Oral QHS   Continuous Infusions:  iron sucrose     methocarbamol (ROBAXIN) IV       LOS: 3 days    Time spent: 32 minutes    Sharen Hones, MD [facility]   To contact the attending provider between 7A-7P or the covering provider during after hours 7P-7A, please log into the web site [email or web address] and access using universal [place] password for that web  site. If you do not have the password, please call the hospital operator.  09/10/2021, 12:12 PM

## 2021-09-10 NOTE — Plan of Care (Signed)

## 2021-09-10 NOTE — Progress Notes (Signed)
Physical Therapy Treatment Patient Details Name: Autumn Johnston MRN: [id number] DOB: 06/05/1922 Today's Date: 09/10/2021   History of Present Illness Pt is a 87 year old female with history of colon cancer status post colostomy, chronic kidney disease stage IIIb, hypertension hyperlipidemia coming in after a fall and found to have a left hip fracture.  S/p L hip IM nail on 09/08/2021.    PT Comments    Pt was pleasant and motivated to participate during the session and put forth good effort during the session.  Pt had difficultly mobilizing the LLE in bed but with log roll training was able to reduce the amount of physical assistance grossly needed to get to sitting at EOB.  Pt was able to stand with min A and cues for sequencing and was able to amb 15 feet before fatiguing and needing to return to sitting.  Pt is making progress towards goals but remains at an elevated risk for falls and would not be safe to return to her prior living situation at this time.  Pt will benefit from PT services in a SNF setting upon discharge to safely address deficits listed in patient problem list for decreased caregiver assistance and eventual return to PLOF.     Recommendations for follow up therapy are one component of a multi-disciplinary discharge planning process, led by the attending physician.  Recommendations may be updated based on patient status, additional functional criteria and insurance authorization.  Follow Up Recommendations  SNF;Supervision/Assistance - 24 hour     Equipment Recommendations  Other (comment) (TBD at next venue of care)    Recommendations for Other Services       Precautions / Restrictions Precautions Precautions: Fall Restrictions Weight Bearing Restrictions: Yes LLE Weight Bearing: Weight bearing as tolerated Other Position/Activity Restrictions: Colostomy bag     Mobility  Bed Mobility Overal bed mobility: Needs Assistance Bed Mobility: Rolling Rolling:  Min assist   Supine to sit: Mod assist Sit to supine: Mod assist   General bed mobility comments: Mod A for BLE and trunk control; log roll training to R provided with pillow between knees for L hip support    Transfers Overall transfer level: Needs assistance Equipment used: Rolling walker (2 wheeled) Transfers: Sit to/from Stand Sit to Stand: Min assist;From elevated surface         General transfer comment: Min A to come to full upright standing with cues for sequencing  Ambulation/Gait Ambulation/Gait assistance: Min guard Gait Distance (Feet): 15 Feet Assistive device: Rolling walker (2 wheeled) Gait Pattern/deviations: Step-to pattern;Antalgic;Decreased stance time - left;Trunk flexed Gait velocity: decreased   General Gait Details: Mildly antalgic gait pattern with decreased LLE stance time but steady without LOB   Stairs             Wheelchair Mobility    Modified Rankin (Stroke Patients Only)       Balance Overall balance assessment: Needs assistance Sitting-balance support: No upper extremity supported;Feet supported Sitting balance-Leahy Scale: Fair     Standing balance support: Bilateral upper extremity supported;During functional activity Standing balance-Leahy Scale: Fair Standing balance comment: Mod lean on the RW for support                            Cognition Arousal/Alertness: Awake/alert Behavior During Therapy: WFL for tasks assessed/performed Overall Cognitive Status: Difficult to assess  General Comments: follows commands with repetition, very HOH      Exercises Total Joint Exercises Ankle Circles/Pumps: AROM;Strengthening;Both;10 reps Quad Sets: Strengthening;Both;10 reps Heel Slides: AROM;AAROM;Strengthening;Both;5 reps Hip ABduction/ADduction: AROM;AAROM;Strengthening;Both;5 reps Straight Leg Raises: 5 reps;Both;AROM;AAROM;Strengthening Long Arc Quad:  AROM;Strengthening;Both;10 reps    General Comments        Pertinent Vitals/Pain Pain Assessment: Faces Faces Pain Scale: Hurts little more Pain Location: LLE with mobility Pain Descriptors / Indicators: Discomfort;Grimacing;Guarding Pain Intervention(s): Monitored during session;Premedicated before session;Repositioned    Home Living Family/patient expects to be discharged to:: Private residence Living Arrangements: Spouse/significant other Available Help at Discharge: Family;Available 24 hours/day Type of Home: House Home Access: Level entry   Home Layout: Two level Home Equipment: Cane - single point;Walker - 2 wheels      Prior Function Level of Independence: Independent with assistive device(s)      Comments: Per patient Mod Ind with either a SPC or a RW but more often with the RW, Ind with ADLs, one other fall in the last 6 months   PT Goals (current goals can now be found in the care plan section) Acute Rehab PT Goals Patient Stated Goal: To feel better then go home PT Goal Formulation: With patient Time For Goal Achievement: 09/23/21 Potential to Achieve Goals: Fair Progress towards PT goals: Progressing toward goals    Frequency    BID      PT Plan Current plan remains appropriate    Co-evaluation              AM-PAC PT "6 Clicks" Mobility   Outcome Measure  Help needed turning from your back to your side while in a flat bed without using bedrails?: A Little Help needed moving from lying on your back to sitting on the side of a flat bed without using bedrails?: A Lot Help needed moving to and from a bed to a chair (including a wheelchair)?: A Lot Help needed standing up from a chair using your arms (e.g., wheelchair or bedside chair)?: A Little Help needed to walk in hospital room?: A Little Help needed climbing 3-5 steps with a railing? : A Lot 6 Click Score: 15    End of Session Equipment Utilized During Treatment: Gait belt;Other (comment)  (Under arms to avoid colostomy) Activity Tolerance: Patient tolerated treatment well Patient left: in chair;with call bell/phone within reach;with chair alarm set;with SCD's reapplied;with family/visitor present Nurse Communication: Mobility status;Weight bearing status PT Visit Diagnosis: Unsteadiness on feet (R26.81);History of falling (Z91.81);Other abnormalities of gait and mobility (R26.89);Muscle weakness (generalized) (M62.81);Pain Pain - Right/Left: Left Pain - part of body: Hip     Time: 1625-1710 PT Time Calculation (min) (ACUTE ONLY): 45 min  Charges:  $Gait Training: 8-22 mins $Therapeutic Exercise: 8-22 mins $Therapeutic Activity: 8-22 mins                     D. Scott Maveric Debono PT, DPT 09/10/21, 5:23 PM

## 2021-09-10 NOTE — TOC Progression Note (Signed)
Transition of Care [facility]) - Progression Note    Patient Details  Name: Autumn Johnston MRN: [id number] Date of Birth: 08/10/1922  Transition of Care [facility]) CM/SW [place], RN Phone Number: 09/10/2021, 3:38 PM  Clinical Narrative:  Patient's spouse states he would like to speak with his daughter about SNF recommendation prior to giving consent for bed search.  He will call TOC when they decide if they would like patient to come home or go to SNF.  TOC contact information given, TOC to follow to discharge.          Expected Discharge Plan and Services                                                 Social Determinants of Health (SDOH) Interventions    Readmission Risk Interventions No flowsheet data found.

## 2021-09-10 NOTE — Evaluation (Signed)
Physical Therapy Evaluation Patient Details Name: Autumn Johnston MRN: [id number] DOB: [birth year] Today's Date: 09/10/2021  History of Present Illness  Pt is a 87 year old female with history of colon cancer status post colostomy, chronic kidney disease stage IIIb, hypertension hyperlipidemia coming in after a fall and found to have a left hip fracture.  S/p L hip IM nail on 09/08/2021.   Clinical Impression  Pt was pleasant and put forth good effort during the session.  Pt required physical assistance with bed mobility and transfers but was able to amb 12 feet with only close CGA with slow, antalgic gait pattern but steady without LOB or buckling.  Pt reported no adverse symptoms during the session other than mild to mod LLE pain with movement/WB.  Pt will benefit from PT services in a SNF setting upon discharge to safely address deficits listed in patient problem list for decreased caregiver assistance and eventual return to PLOF.      Recommendations for follow up therapy are one component of a multi-disciplinary discharge planning process, led by the attending physician.  Recommendations may be updated based on patient status, additional functional criteria and insurance authorization.  Follow Up Recommendations SNF;Supervision/Assistance - 24 hour    Equipment Recommendations  Other (comment) (TBD at next venue of care)    Recommendations for Other Services       Precautions / Restrictions Precautions Precautions: Fall Restrictions Weight Bearing Restrictions: Yes LLE Weight Bearing: Weight bearing as tolerated Other Position/Activity Restrictions: Colostomy bag      Mobility  Bed Mobility Overal bed mobility: Needs Assistance Bed Mobility: Supine to Sit;Sit to Supine     Supine to sit: Mod assist Sit to supine: Mod assist   General bed mobility comments: Mod A for BLE and trunk control    Transfers Overall transfer level: Needs assistance Equipment used: Rolling  walker (2 wheeled) Transfers: Sit to/from Stand Sit to Stand: Min assist         General transfer comment: Min A to come to full upright standing with cues for sequencing  Ambulation/Gait Ambulation/Gait assistance: Min guard Gait Distance (Feet): 12 Feet Assistive device: Rolling walker (2 wheeled) Gait Pattern/deviations: Step-to pattern;Antalgic;Decreased stance time - left;Trunk flexed Gait velocity: decreased   General Gait Details: Mildly antalgic gait pattern with decreased LLE stance time but steady without LOB  Stairs            Wheelchair Mobility    Modified Rankin (Stroke Patients Only)       Balance Overall balance assessment: Needs assistance Sitting-balance support: No upper extremity supported;Feet supported Sitting balance-Leahy Scale: Fair     Standing balance support: Bilateral upper extremity supported;During functional activity Standing balance-Leahy Scale: Fair Standing balance comment: Mod lean on the RW for support                             Pertinent Vitals/Pain Pain Assessment: Faces Faces Pain Scale: Hurts little more Pain Location: LLE with mobility Pain Descriptors / Indicators: Discomfort;Grimacing;Guarding Pain Intervention(s): Premedicated before session;Monitored during session;Repositioned    Home Living Family/patient expects to be discharged to:: Private residence Living Arrangements: Spouse/significant other Available Help at Discharge: Family;Available 24 hours/day Type of Home: House Home Access: Level entry     Home Layout: Two level Home Equipment: Cane - single point;Walker - 2 wheels      Prior Function Level of Independence: Independent with assistive device(s)  Comments: Per patient Mod Ind with either a SPC or a RW but more often with the RW, Ind with ADLs, one other fall in the last 6 months     Hand Dominance        Extremity/Trunk Assessment   Upper Extremity  Assessment Upper Extremity Assessment: Generalized weakness    Lower Extremity Assessment Lower Extremity Assessment: Generalized weakness;LLE deficits/detail LLE: Unable to fully assess due to pain       Communication   Communication: HOH  Cognition Arousal/Alertness: Awake/alert Behavior During Therapy: WFL for tasks assessed/performed Overall Cognitive Status: Difficult to assess                                 General Comments: follows commands with repetition, very HOH      General Comments      Exercises Total Joint Exercises Ankle Circles/Pumps: AROM;Strengthening;Both;10 reps Quad Sets: Strengthening;Both;10 reps Heel Slides: AROM;AAROM;Strengthening;Both;10 reps Hip ABduction/ADduction: AROM;AAROM;Strengthening;Both;10 reps Straight Leg Raises: AROM;AAROM;Strengthening;Both;5 reps Long Arc Quad: AROM;Strengthening;Both;10 reps Other Exercises Other Exercises: Pt edcuated re: OT role, DME recs, d/c recs, falls prevention, ECS Other Exercises: LBD, grooming, sup<>sit, sit<>stand, sitting/standing balance/tolerance, ~3 ft forward/backward mobility   Assessment/Plan    PT Assessment Patient needs continued PT services  PT Problem List Decreased strength;Decreased activity tolerance;Decreased balance;Decreased mobility;Decreased knowledge of use of DME;Pain       PT Treatment Interventions DME instruction;Gait training;Stair training;Functional mobility training;Therapeutic activities;Therapeutic exercise;Balance training;Patient/family education    PT Goals (Current goals can be found in the Care Plan section)  Acute Rehab PT Goals Patient Stated Goal: To feel better then go home PT Goal Formulation: With patient Time For Goal Achievement: 09/23/21 Potential to Achieve Goals: Fair    Frequency BID   Barriers to discharge Inaccessible home environment;Decreased caregiver support      Co-evaluation               AM-PAC PT "6 Clicks"  Mobility  Outcome Measure Help needed turning from your back to your side while in a flat bed without using bedrails?: A Little Help needed moving from lying on your back to sitting on the side of a flat bed without using bedrails?: A Little Help needed moving to and from a bed to a chair (including a wheelchair)?: A Little Help needed standing up from a chair using your arms (e.g., wheelchair or bedside chair)?: A Little Help needed to walk in hospital room?: A Little Help needed climbing 3-5 steps with a railing? : A Lot 6 Click Score: 17    End of Session Equipment Utilized During Treatment: Gait belt Activity Tolerance: Patient tolerated treatment well Patient left: in bed;with call bell/phone within reach;with bed alarm set;with SCD's reapplied;Other (comment) (Pt left in bed secondary to upcoming transport for CT) Nurse Communication: Mobility status PT Visit Diagnosis: Unsteadiness on feet (R26.81);History of falling (Z91.81);Other abnormalities of gait and mobility (R26.89);Muscle weakness (generalized) (M62.81);Pain Pain - Right/Left: Left Pain - part of body: Hip    Time: 5697-9480 PT Time Calculation (min) (ACUTE ONLY): 43 min   Charges:   PT Evaluation $PT Eval Moderate Complexity: 1 Mod PT Treatments $Therapeutic Exercise: 8-22 mins       D. Scott Zenovia Justman PT, DPT 09/10/21, 2:00 PM

## 2021-09-11 DIAGNOSIS — D62 Acute posthemorrhagic anemia: Secondary | ICD-10-CM | POA: Diagnosis not present

## 2021-09-11 DIAGNOSIS — S72002S Fracture of unspecified part of neck of left femur, sequela: Secondary | ICD-10-CM | POA: Diagnosis not present

## 2021-09-11 DIAGNOSIS — S72142A Displaced intertrochanteric fracture of left femur, initial encounter for closed fracture: Secondary | ICD-10-CM | POA: Diagnosis not present

## 2021-09-11 DIAGNOSIS — U071 COVID-19: Secondary | ICD-10-CM | POA: Diagnosis not present

## 2021-09-11 LAB — BASIC METABOLIC PANEL
Anion gap: 8 (ref 5–15)
BUN: 25 mg/dL — ABNORMAL HIGH (ref 8–23)
CO2: 21 mmol/L — ABNORMAL LOW (ref 22–32)
Calcium: 8 mg/dL — ABNORMAL LOW (ref 8.9–10.3)
Chloride: 104 mmol/L (ref 98–111)
Creatinine, Ser: 1.29 mg/dL — ABNORMAL HIGH (ref 0.44–1.00)
GFR, Estimated: 38 mL/min — ABNORMAL LOW (ref >60)
Glucose, Bld: 84 mg/dL (ref 70–99)
Potassium: 4 mmol/L (ref 3.5–5.1)
Sodium: 133 mmol/L — ABNORMAL LOW (ref 135–145)

## 2021-09-11 LAB — CBC
HCT: 25.6 % — ABNORMAL LOW (ref 36.0–46.0)
Hemoglobin: 8.7 g/dL — ABNORMAL LOW (ref 12.0–15.0)
MCH: 30 pg (ref 26.0–34.0)
MCHC: 34 g/dL (ref 30.0–36.0)
MCV: 88.3 fL (ref 80.0–100.0)
Platelets: 222 10*3/uL (ref 150–400)
RBC: 2.9 MIL/uL — ABNORMAL LOW (ref 3.87–5.11)
RDW: 15 % (ref 11.5–15.5)
WBC: 6 10*3/uL (ref 4.0–10.5)
nRBC: 0 % (ref 0.0–0.2)

## 2021-09-11 LAB — MAGNESIUM: Magnesium: 1.8 mg/dL (ref 1.7–2.4)

## 2021-09-11 MED ORDER — SENNOSIDES-DOCUSATE SODIUM 8.6-50 MG PO TABS
2.0000 | ORAL_TABLET | Freq: Two times a day (BID) | ORAL | Status: DC
  Administered 2021-09-11 – 2021-09-18 (×9): 2 via ORAL
  Filled 2021-09-11 (×10): qty 2

## 2021-09-11 MED ORDER — CYANOCOBALAMIN 1000 MCG/ML IJ SOLN
1000.0000 ug | Freq: Once | INTRAMUSCULAR | Status: AC
  Administered 2021-09-11: 1000 ug via INTRAMUSCULAR
  Filled 2021-09-11: qty 1

## 2021-09-11 NOTE — Progress Notes (Signed)
Patient is on COVID isolation until 09/18/2021, tested positive 09/07/2021

## 2021-09-11 NOTE — Plan of Care (Signed)
No acute events during the night. VSS. Pain under control.  Problem: Education: Goal: Knowledge of General Education information will improve Description: Including pain rating scale, medication(s)/side effects and non-pharmacologic comfort measures Outcome: Progressing   Problem: Health Behavior/Discharge Planning: Goal: Ability to manage health-related needs will improve Outcome: Progressing   Problem: Clinical Measurements: Goal: Ability to maintain clinical measurements within normal limits will improve Outcome: Progressing Goal: Will remain free from infection Outcome: Progressing Goal: Diagnostic test results will improve Outcome: Progressing Goal: Respiratory complications will improve Outcome: Progressing Goal: Cardiovascular complication will be avoided Outcome: Progressing   Problem: Activity: Goal: Risk for activity intolerance will decrease Outcome: Progressing   Problem: Nutrition: Goal: Adequate nutrition will be maintained Outcome: Progressing   Problem: Coping: Goal: Level of anxiety will decrease Outcome: Progressing   Problem: Elimination: Goal: Will not experience complications related to bowel motility Outcome: Progressing Goal: Will not experience complications related to urinary retention Outcome: Progressing   Problem: Pain Managment: Goal: General experience of comfort will improve Outcome: Progressing   Problem: Safety: Goal: Ability to remain free from injury will improve Outcome: Progressing   Problem: Skin Integrity: Goal: Risk for impaired skin integrity will decrease Outcome: Progressing   Problem: Education: Goal: Verbalization of understanding the information provided (i.e., activity precautions, restrictions, etc) will improve Outcome: Progressing Goal: Individualized Educational Video(s) Outcome: Progressing   Problem: Activity: Goal: Ability to ambulate and perform ADLs will improve Outcome: Progressing   Problem:  Clinical Measurements: Goal: Postoperative complications will be avoided or minimized Outcome: Progressing   Problem: Self-Concept: Goal: Ability to maintain and perform role responsibilities to the fullest extent possible will improve Outcome: Progressing   Problem: Pain Management: Goal: Pain level will decrease Outcome: Progressing   Problem: Education: Goal: Verbalization of understanding the information provided (i.e., activity precautions, restrictions, etc) will improve Outcome: Progressing Goal: Individualized Educational Video(s) Outcome: Progressing   Problem: Activity: Goal: Ability to ambulate and perform ADLs will improve Outcome: Progressing   Problem: Clinical Measurements: Goal: Postoperative complications will be avoided or minimized Outcome: Progressing   Problem: Self-Concept: Goal: Ability to maintain and perform role responsibilities to the fullest extent possible will improve Outcome: Progressing   Problem: Pain Management: Goal: Pain level will decrease Outcome: Progressing

## 2021-09-11 NOTE — TOC Progression Note (Signed)
Transition of Care [facility]) - Progression Note    Patient Details  Name: Autumn Johnston MRN: [id number] Date of Birth: 1922-06-28  [facility] CM/SW [place], RN Phone Number: 09/11/2021, 11:02 AM  Clinical Narrative:   Patient's daughter gave permission to do a bed search for SNF.  Bed search started, will communicate bed offers to family when available.  TOC contact information given, TOC to follow to discharge.         Expected Discharge Plan and Services                                                 Social Determinants of Health (SDOH) Interventions    Readmission Risk Interventions No flowsheet data found.

## 2021-09-11 NOTE — Progress Notes (Signed)
Physical Therapy Treatment Patient Details Name: Autumn Johnston MRN: [id number] DOB: [birth year] Today's Date: 09/11/2021   History of Present Illness Pt is a 87 year old female with history of colon cancer status post colostomy, chronic kidney disease stage IIIb, hypertension hyperlipidemia coming in after a fall and found to have a left hip fracture.  S/p L hip IM nail on 09/08/2021.    PT Comments    Pt was pleasant but stated was fatigued after just having finished taking a bath while sitting at the EOB.  Pt stated she was hoping to have a nap but agreed to supine therex.  Pt put forth good effort with below exercises and reported only mild pain to the LLE with movement.  Pt will benefit from PT services in a SNF setting upon discharge to safely address deficits listed in patient problem list for decreased caregiver assistance and eventual return to PLOF.    Recommendations for follow up therapy are one component of a multi-disciplinary discharge planning process, led by the attending physician.  Recommendations may be updated based on patient status, additional functional criteria and insurance authorization.  Follow Up Recommendations  SNF;Supervision/Assistance - 24 hour     Equipment Recommendations  Other (comment) (TBD at next venue of care)    Recommendations for Other Services       Precautions / Restrictions Precautions Precautions: Fall Restrictions Weight Bearing Restrictions: Yes LLE Weight Bearing: Weight bearing as tolerated Other Position/Activity Restrictions: Colostomy bag     Mobility  Bed Mobility               General bed mobility comments: Pt declined all but supine therex this session secondary to fatigue from having just had a bath while sitting at EOB    Transfers                    Ambulation/Gait                 Stairs             Wheelchair Mobility    Modified Rankin (Stroke Patients Only)        Balance                                            Cognition Arousal/Alertness: Awake/alert Behavior During Therapy: WFL for tasks assessed/performed Overall Cognitive Status: Difficult to assess                                 General Comments: follows commands with repetition, very HOH      Exercises Total Joint Exercises Ankle Circles/Pumps: AROM;Strengthening;Both;10 reps (with manual resistance) Quad Sets: Strengthening;Both;10 reps Gluteal Sets: Strengthening;Both;10 reps Towel Squeeze: Strengthening;Both;10 reps Short Arc Quad: Strengthening;Both;10 reps (with manual resistance) Heel Slides: AAROM;Strengthening;Both;10 reps Hip ABduction/ADduction: AROM;AAROM;Strengthening;Both;10 reps Straight Leg Raises: Both;AAROM;Strengthening;10 reps    General Comments        Pertinent Vitals/Pain Pain Assessment: Faces Faces Pain Scale: Hurts a little bit Pain Location: LLE with mobility Pain Descriptors / Indicators: Sore Pain Intervention(s): Repositioned;Premedicated before session;Monitored during session    Home Living                      Prior Function            PT Goals (  current goals can now be found in the care plan section) Progress towards PT goals: Progressing toward goals    Frequency    BID      PT Plan Current plan remains appropriate    Co-evaluation              AM-PAC PT "6 Clicks" Mobility   Outcome Measure  Help needed turning from your back to your side while in a flat bed without using bedrails?: A Little Help needed moving from lying on your back to sitting on the side of a flat bed without using bedrails?: A Lot Help needed moving to and from a bed to a chair (including a wheelchair)?: A Lot Help needed standing up from a chair using your arms (e.g., wheelchair or bedside chair)?: A Little Help needed to walk in hospital room?: A Little Help needed climbing 3-5 steps with a railing?  : A Lot 6 Click Score: 15    End of Session   Activity Tolerance: Patient tolerated treatment well Patient left: in bed;with call bell/phone within reach;with bed alarm set Nurse Communication: Mobility status;Weight bearing status PT Visit Diagnosis: Unsteadiness on feet (R26.81);History of falling (Z91.81);Other abnormalities of gait and mobility (R26.89);Muscle weakness (generalized) (M62.81);Pain Pain - Right/Left: Left Pain - part of body: Hip     Time: 6384-5364 PT Time Calculation (min) (ACUTE ONLY): 27 min  Charges:  $Therapeutic Exercise: 8-22 mins $Therapeutic Activity: 8-22 mins                     D. Scott Eudelia Hiltunen PT, DPT 09/11/21, 4:50 PM

## 2021-09-11 NOTE — Progress Notes (Signed)
  Subjective: 3 Days Post-Op Procedure(s) (LRB): INTRAMEDULLARY (IM) NAIL INTERTROCHANTRIC (Left) Patient reports pain as mild.   Patient is well, and has had no acute complaints or problems Plan is to go Rehab after hospital stay. Negative for chest pain and shortness of breath Fever: no Gastrointestinal:Negative for nausea and vomiting  Objective: Vital signs in last 24 hours: Temp:  [97.6 F (36.4 C)-98.2 F (36.8 C)] 97.8 F (36.6 C) (09/22 0412) Pulse Rate:  [64-81] 73 (09/22 0412) Resp:  [16] 16 (09/22 0412) BP: (134-172)/(56-76) 170/76 (09/22 0412) SpO2:  [97 %-99 %] 98 % (09/22 0412)  Intake/Output from previous day:  Intake/Output Summary (Last 24 hours) at 09/11/2021 0600 Last data filed at 09/10/2021 1806 Gross per 24 hour  Intake --  Output 600 ml  Net -600 ml    Intake/Output this shift: No intake/output data recorded.  Labs: Recent Labs    09/08/21 0813 09/09/21 0511 09/09/21 1509 09/10/21 0517 09/11/21 0421  HGB 8.1* 5.5* 8.1* 7.8* 8.7*   Recent Labs    09/10/21 0517 09/11/21 0421  WBC 5.6 6.0  RBC 2.61* 2.90*  HCT 22.9* 25.6*  PLT 188 222   Recent Labs    09/10/21 0517 09/11/21 0421  NA 134* 133*  K 4.3 4.0  CL 105 104  CO2 22 21*  BUN 37* 25*  CREATININE 1.60* 1.29*  GLUCOSE 86 84  CALCIUM 7.7* 8.0*   Recent Labs    09/08/21 0813  INR 1.0     EXAM General - Patient is Alert and Confused Extremity - Sensation intact distally Dorsiflexion/Plantar flexion intact Compartment soft Dressing/Incision - clean, dry, scant and blood tinged drainage Motor Function - intact, moving foot and toes well on exam.   Past Medical History:  Diagnosis Date   CKD (chronic kidney disease) stage 4, GFR 15-29 ml/min (HCC)    Colon cancer (HCC)    Hypertension     Assessment/Plan: 3 Days Post-Op Procedure(s) (LRB): INTRAMEDULLARY (IM) NAIL INTERTROCHANTRIC (Left) Principal Problem:   Closed hip fracture requiring operative repair, left,  sequela Active Problems:   Anemia of chronic disease   Essential hypertension   Chronic kidney disease (CKD), stage IV (severe) (HCC)   Fall at home, initial encounter   Acute blood loss anemia   COVID-19 virus infection   Left lower quadrant abdominal mass   Hypotension  Estimated body mass index is 32.06 kg/m as calculated from the following:   Height as of this encounter: 5\' 3"  (1.6 m).   Weight as of this encounter: 82.1 kg. Advance diet Up with therapy  Acute blood loss anemia post surgical.  Hgb 8.7  Received 1 unit of transfused blood Monday.  Vitals stable.  DVT Prophylaxis - Lovenox, Foot Pumps, and TED hose Weight-Bearing as tolerated to Left leg  Autumn Dixon, PA-C Orthopaedic Surgery 09/11/2021, 6:00 AM

## 2021-09-11 NOTE — NC FL2 (Signed)
[place] LEVEL OF CARE SCREENING TOOL     IDENTIFICATION  Patient Name: Autumn Johnston Birthdate: Oct 06, 1922 Sex: female Admission Date (Current Location): 09/07/2021  [facility] and [place] Number:  Engineering geologist and Address:  [facility], [address], [place], [place] [address]      Provider Number: [id number]  Attending Physician Name and Address:  Sharen Hones, MD  Relative Name and Phone Number:  sykes,jennie Daughter     [phone]    Current Level of Care: Hospital Recommended Level of Care: [place] Prior Approval Number:    Date Approved/Denied:   PASRR Number: [id number]  Discharge Plan: SNF    Current Diagnoses: Patient Active Problem List   Diagnosis Date Noted   Acute blood loss anemia    COVID-19 virus infection    Left lower quadrant abdominal mass    Hypotension    Pre-op evaluation    Stage 3b chronic kidney disease ([place])    Closed hip fracture requiring operative repair, left, sequela 09/07/2021   Fall at home, initial encounter 09/07/2021   Protein-calorie malnutrition, severe 07/31/2021   Nonsustained ventricular tachycardia ([place]) 07/30/2021   Hyponatremia 07/29/2021   Demand ischemia ([place])    Hypertensive urgency 07/28/2021   Protein calorie malnutrition ([place]) 07/28/2021   Essential hypertension 07/27/2021   Hyperlipidemia 07/27/2021   Pernicious anemia 07/27/2021   Renal failure (ARF), acute on chronic ([place]) 07/27/2021   Acute hyponatremia 07/27/2021   Elevated troponin 50/02/7047   Acute metabolic encephalopathy 88/91/6945   Chronic insomnia 04/05/2017   Chronic kidney disease (CKD), stage IV (severe) (HCC) 12/25/2015   Anemia of chronic disease 07/20/2014    Orientation RESPIRATION BLADDER Height & Weight     Self, Situation  Normal Incontinent Weight: 82.1 kg Height:  5\' 3"  (160 cm)  BEHAVIORAL SYMPTOMS/MOOD NEUROLOGICAL BOWEL NUTRITION STATUS       Incontinent Diet  AMBULATORY STATUS COMMUNICATION OF NEEDS Skin   Limited Assist (Amublated 15 feet) Verbally Other (Comment) (Honeycomb dressing L hip, Scant discharge)                       Personal Care Assistance Level of Assistance  Bathing, Feeding, Dressing Bathing Assistance: Limited assistance Feeding assistance: Limited assistance Dressing Assistance: Limited assistance     Functional Limitations Info  Sight, Speech, Hearing Sight Info: Impaired Hearing Info: Impaired Speech Info: Adequate    SPECIAL CARE FACTORS FREQUENCY  PT (By licensed PT), OT (By licensed OT)     PT Frequency: 5x weekly OT Frequency: 5x weekly            Contractures Contractures Info: Not present    Additional Factors Info  Code Status, Allergies Code Status Info: DNR Allergies Info: No Known Allergies           Current Medications (09/11/2021):  This is the current hospital active medication list Current Facility-Administered Medications  Medication Dose Route Frequency Provider Last Rate Last Admin   acetaminophen (TYLENOL) tablet 1,000 mg  1,000 mg Oral Q8H Leim Fabry, MD   1,000 mg at 09/11/21 0604   ALPRAZolam Duanne Moron) tablet 0.25 mg  0.25 mg Oral BID PRN Leim Fabry, MD       bisacodyl (DULCOLAX) suppository 10 mg  10 mg Rectal Daily PRN Leim Fabry, MD       Chlorhexidine Gluconate Cloth 2 % PADS 6 each  6 each Topical Daily Loletha Grayer, MD   6 each at 09/11/21  1105   cyanocobalamin ((VITAMIN B-12)) injection 1,000 mcg  1,000 mcg Intramuscular Once Sharen Hones, MD       enoxaparin (LOVENOX) injection 30 mg  30 mg Subcutaneous Q24H Nazari, Walid A, RPH       HYDROmorphone (DILAUDID) injection 0.2-0.4 mg  0.2-0.4 mg Intravenous Q4H PRN Leim Fabry, MD       methocarbamol (ROBAXIN) tablet 500 mg  500 mg Oral Q6H PRN Leim Fabry, MD       Or   methocarbamol (ROBAXIN) 500 mg in dextrose 5 % 50 mL IVPB  500 mg Intravenous Q6H PRN Leim Fabry, MD        metoCLOPramide (REGLAN) tablet 5-10 mg  5-10 mg Oral Q8H PRN Leim Fabry, MD       Or   metoCLOPramide (REGLAN) injection 5-10 mg  5-10 mg Intravenous Q8H PRN Leim Fabry, MD       ondansetron [facility]) tablet 4 mg  4 mg Oral Q6H PRN Leim Fabry, MD       Or   ondansetron [facility]) injection 4 mg  4 mg Intravenous Q6H PRN Leim Fabry, MD       oxyCODONE (Oxy IR/ROXICODONE) immediate release tablet 2.5-5 mg  2.5-5 mg Oral Q4H PRN Leim Fabry, MD       oxyCODONE (Oxy IR/ROXICODONE) immediate release tablet 5-10 mg  5-10 mg Oral Q4H PRN Leim Fabry, MD       pantoprazole (PROTONIX) injection 40 mg  40 mg Intravenous Q12H Leim Fabry, MD   40 mg at 09/11/21 7588   polyethylene glycol (MIRALAX / GLYCOLAX) packet 17 g  17 g Oral BID Loletha Grayer, MD   17 g at 09/11/21 3254   prochlorperazine (COMPAZINE) injection 5 mg  5 mg Intravenous Q6H PRN Leim Fabry, MD   5 mg at 09/08/21 0017   QUEtiapine (SEROQUEL) tablet 12.5 mg  12.5 mg Oral QHS Leim Fabry, MD   12.5 mg at 09/10/21 2101   senna-docusate (Senokot-S) tablet 2 tablet  2 tablet Oral BID Sharen Hones, MD       sertraline (ZOLOFT) tablet 50 mg  50 mg Oral Daily Leim Fabry, MD   50 mg at 09/11/21 9826   simvastatin (ZOCOR) tablet 20 mg  20 mg Oral QHS Leim Fabry, MD   20 mg at 09/10/21 2101   sodium phosphate (FLEET) 7-19 GM/118ML enema 1 enema  1 enema Rectal Once PRN Leim Fabry, MD       traMADol Veatrice Bourbon) tablet 50 mg  50 mg Oral Q6H PRN Leim Fabry, MD   50 mg at 09/10/21 4158     Discharge Medications: Please see discharge summary for a list of discharge medications.  Relevant Imaging Results:  Relevant Lab Results:   Additional Information [id number]  Pete Pelt, RN

## 2021-09-11 NOTE — Progress Notes (Signed)
Physical Therapy Treatment Patient Details Name: Autumn Johnston MRN: [id number] DOB: [birth year] Today's Date: 09/11/2021   History of Present Illness Pt is a 87 year old female with history of colon cancer status post colostomy, chronic kidney disease stage IIIb, hypertension hyperlipidemia coming in after a fall and found to have a left hip fracture.  S/p L hip IM nail on 09/08/2021.    PT Comments    Pt was pleasant and motivated to participate during the session and continued to put forth good effort.  Pt required physical assistance with bed mobility tasks but grossly less so than during the prior session.  Pt ambulated with very slow and mildly antalgic cadence but was steady without LOB.  Pt ambulated 2 x 10 feet this session with 10 feet being her self-selected maximum distance before needing a seated rest break.  Pt will benefit from PT services in a SNF setting upon discharge to safely address deficits listed in patient problem list for decreased caregiver assistance and eventual return to PLOF.      Recommendations for follow up therapy are one component of a multi-disciplinary discharge planning process, led by the attending physician.  Recommendations may be updated based on patient status, additional functional criteria and insurance authorization.  Follow Up Recommendations  SNF;Supervision/Assistance - 24 hour     Equipment Recommendations  Other (comment) (TBD at next venue of care)    Recommendations for Other Services       Precautions / Restrictions Precautions Precautions: Fall Restrictions Weight Bearing Restrictions: Yes LLE Weight Bearing: Weight bearing as tolerated Other Position/Activity Restrictions: Colostomy bag     Mobility  Bed Mobility Overal bed mobility: Needs Assistance       Supine to sit: Mod assist     General bed mobility comments: Mod A for BLE and trunk control    Transfers Overall transfer level: Needs assistance Equipment  used: Rolling walker (2 wheeled) Transfers: Sit to/from Stand Sit to Stand: From elevated surface;Min guard         General transfer comment: No physical assistance needed to come to standing this session  Ambulation/Gait Ambulation/Gait assistance: Min guard Gait Distance (Feet): 10 Feet x 2 Assistive device: Rolling walker (2 wheeled) Gait Pattern/deviations: Step-to pattern;Antalgic;Decreased stance time - left;Trunk flexed Gait velocity: decreased   General Gait Details: Mildly antalgic gait pattern with decreased LLE stance time but steady without LOB   Stairs             Wheelchair Mobility    Modified Rankin (Stroke Patients Only)       Balance Overall balance assessment: Needs assistance Sitting-balance support: No upper extremity supported;Feet supported Sitting balance-Leahy Scale: Fair     Standing balance support: Bilateral upper extremity supported;During functional activity Standing balance-Leahy Scale: Fair Standing balance comment: Min to mod lean on the RW for support                            Cognition Arousal/Alertness: Awake/alert Behavior During Therapy: WFL for tasks assessed/performed Overall Cognitive Status: Difficult to assess                                 General Comments: follows commands with repetition, very HOH      Exercises Total Joint Exercises Ankle Circles/Pumps: AROM;Strengthening;Both;10 reps Quad Sets: Strengthening;Both;10 reps Gluteal Sets: Strengthening;Both;10 reps Hip ABduction/ADduction: AROM;AAROM;Strengthening;Both;5 reps Straight Leg Raises: 5 reps;Both;AROM;AAROM;Strengthening  Long Arc Quad: AROM;Strengthening;Both;10 reps;5 reps Knee Flexion: AROM;Strengthening;Both;5 reps;10 reps    General Comments        Pertinent Vitals/Pain Pain Assessment: Faces Faces Pain Scale: Hurts a little bit Pain Location: LLE with mobility Pain Descriptors / Indicators: Sore Pain  Intervention(s): Premedicated before session;Repositioned;Monitored during session    Home Living                      Prior Function            PT Goals (current goals can now be found in the care plan section) Progress towards PT goals: Progressing toward goals    Frequency    BID      PT Plan Current plan remains appropriate    Co-evaluation              AM-PAC PT "6 Clicks" Mobility   Outcome Measure  Help needed turning from your back to your side while in a flat bed without using bedrails?: A Little Help needed moving from lying on your back to sitting on the side of a flat bed without using bedrails?: A Lot Help needed moving to and from a bed to a chair (including a wheelchair)?: A Lot Help needed standing up from a chair using your arms (e.g., wheelchair or bedside chair)?: A Little Help needed to walk in hospital room?: A Little Help needed climbing 3-5 steps with a railing? : A Lot 6 Click Score: 15    End of Session Equipment Utilized During Treatment: Gait belt;Other (comment) (Under arms to avoid colostomy) Activity Tolerance: Patient tolerated treatment well Patient left: in chair;with call bell/phone within reach;with chair alarm set;with SCD's reapplied;with family/visitor present Nurse Communication: Mobility status;Weight bearing status PT Visit Diagnosis: Unsteadiness on feet (R26.81);History of falling (Z91.81);Other abnormalities of gait and mobility (R26.89);Muscle weakness (generalized) (M62.81);Pain Pain - Right/Left: Left Pain - part of body: Hip     Time: 1950-9326 PT Time Calculation (min) (ACUTE ONLY): 41 min  Charges:  $Gait Training: 8-22 mins $Therapeutic Exercise: 8-22 mins $Therapeutic Activity: 8-22 mins                     D. Scott Anuradha Chabot PT, DPT 09/11/21, 12:12 PM

## 2021-09-11 NOTE — Progress Notes (Signed)
PROGRESS NOTE    Autumn Johnston  FXT:[id number] DOB: 10/16/1922 DOA: 09/07/2021 PCP: Idelle Crouch, MD    Brief Narrative:   87 year old female with history of colon cancer status post colostomy, chronic kidney disease stage IIIb, hypertension hyperlipidemia coming in after a fall and found to have a left hip fracture.  Patient had surgery on 09/08/2021.  Hemoglobin dropped down to 5.5 postoperatively and given 1 unit of packed red blood cells and hemoglobin came up to 8.1 this afternoon.  CT scan abdomen pelvis ordered for mass versus hard stools felt in the left lower quadrant.   Assessment & Plan:   Principal Problem:   Closed hip fracture requiring operative repair, left, sequela Active Problems:   Anemia of chronic disease   Essential hypertension   Chronic kidney disease (CKD), stage IV (severe) (HCC)   Fall at home, initial encounter   Acute blood loss anemia   COVID-19 virus infection   Left lower quadrant abdominal mass   Hypotension    #1.  Left hip fracture s/p repair 9/19. Acute blood loss anemia. Iron deficient anemia. Patient is doing well today, hemoglobin is better after giving IV iron. B12 level is borderline, will give a B12 injection, check homocystine level. Continue PT/OT, pending placement  #2.  Asymptomatic COVID infection. No need for treatment.  3.  Constipation. Peristomal hernia. CT scan abdomen/pelvis showed peristomal hernia, no mass. Patient had a large bowel movement yesterday.  We will continue stool softener today.  4.  Chronic kidney disease stage IIIb. hyponatremia Renal function minimally improved after giving fluids.  5.  Relative hypotension. Continue hold blood pressure medicines.  DVT prophylaxis: Lovenox Code Status: DNR Family Communication: daughter updated on the phone Disposition Plan:      Status is: Inpatient   Remains inpatient appropriate because:Unsafe discharge plan   Dispo: The patient is from:  Home              Anticipated d/c is to: SNF              Patient currently is not medically stable to d/c.              Difficult to place patient Yes        I/O last 3 completed shifts: In: -  Out: 950 [Urine:700; Stool:250] No intake/output data recorded.     Consultants:  Orthopedics  Procedures: Hip  Antimicrobials: None  Subjective: Patient doing well today, she is pleasant, she slept well last night. She denies any short of breath or cough, off oxygen. No abdominal pain or nausea vomiting.  She had a large bowel movement yesterday.  Left side abdominal mass improved. No fever or chills. No dysuria hematuria. No headache or dizziness.  Objective: Vitals:   09/10/21 2028 09/10/21 2344 09/11/21 0412 09/11/21 0838  BP: (!) 172/65 134/67 (!) 170/76 (!) 152/65  Pulse: 64 71 73 70  Resp: 16 16 16 15   Temp: 97.9 F (36.6 C) 98.2 F (36.8 C) 97.8 F (36.6 C) 97.9 F (36.6 C)  TempSrc:      SpO2: 97% 97% 98% 97%  Weight:      Height:        Intake/Output Summary (Last 24 hours) at 09/11/2021 1107 Last data filed at 09/10/2021 1806 Gross per 24 hour  Intake --  Output 600 ml  Net -600 ml   Filed Weights   09/07/21 2018  Weight: 82.1 kg    Examination:  General exam: Appears calm  and comfortable  Respiratory system: Clear to auscultation. Respiratory effort normal. Cardiovascular system: S1 & S2 heard, RRR. No JVD, murmurs, rubs, gallops or clicks. No pedal edema. Gastrointestinal system: Abdomen is nondistended, soft and nontender. No organomegaly or masses felt. Normal bowel sounds heard. Central nervous system: Alert and oriented x2. No focal neurological deficits. Extremities: Symmetric 5 x 5 power. Skin: No rashes, lesions or ulcers Psychiatry: Mood & affect appropriate.     Data Reviewed: I have personally reviewed following labs and imaging studies  CBC: Recent Labs  Lab 09/07/21 2027 09/08/21 0813 09/09/21 0511 09/09/21 1509  09/10/21 0517 09/11/21 0421  WBC 7.5 7.0 5.0  --  5.6 6.0  NEUTROABS 4.9  --   --   --   --   --   HGB 8.6* 8.1* 5.5* 8.1* 7.8* 8.7*  HCT 26.1* 24.9* 17.3*  --  22.9* 25.6*  MCV 93.2 93.3 93.5  --  87.7 88.3  PLT 245 258 168  --  188 539   Basic Metabolic Panel: Recent Labs  Lab 09/07/21 2027 09/08/21 0813 09/09/21 0511 09/10/21 0517 09/11/21 0421  NA 133* 132* 136 134* 133*  K 4.4 4.3 4.7 4.3 4.0  CL 101 101 109 105 104  CO2 23 20* 22 22 21*  GLUCOSE 123* 148* 120* 86 84  BUN 39* 38* 38* 37* 25*  CREATININE 1.55* 1.50* 1.48* 1.60* 1.29*  CALCIUM 8.5* 8.5* 7.5* 7.7* 8.0*  MG  --   --   --   --  1.8   GFR: Estimated Creatinine Clearance: 25.9 mL/min (A) (by C-G formula based on SCr of 1.29 mg/dL (H)). Liver Function Tests: Recent Labs  Lab 09/07/21 2027  AST 23  ALT 13  ALKPHOS 60  BILITOT 0.7  PROT 6.2*  ALBUMIN 3.2*   No results for input(s): LIPASE, AMYLASE in the last 168 hours. No results for input(s): AMMONIA in the last 168 hours. Coagulation Profile: Recent Labs  Lab 09/07/21 2027 09/08/21 0813  INR 1.1 1.0   Cardiac Enzymes: No results for input(s): CKTOTAL, CKMB, CKMBINDEX, TROPONINI in the last 168 hours. BNP (last 3 results) No results for input(s): PROBNP in the last 8760 hours. HbA1C: No results for input(s): HGBA1C in the last 72 hours. CBG: No results for input(s): GLUCAP in the last 168 hours. Lipid Profile: No results for input(s): CHOL, HDL, LDLCALC, TRIG, CHOLHDL, LDLDIRECT in the last 72 hours. Thyroid Function Tests: No results for input(s): TSH, T4TOTAL, FREET4, T3FREE, THYROIDAB in the last 72 hours. Anemia Panel: No results for input(s): VITAMINB12, FOLATE, FERRITIN, TIBC, IRON, RETICCTPCT in the last 72 hours. Sepsis Labs: No results for input(s): PROCALCITON, LATICACIDVEN in the last 168 hours.  Recent Results (from the past 240 hour(s))  Resp Panel by RT-PCR (Flu A&B, Covid) Nasopharyngeal Swab     Status: Abnormal    Collection Time: 09/07/21 10:07 PM   Specimen: Nasopharyngeal Swab; Nasopharyngeal(NP) swabs in vial transport medium  Result Value Ref Range Status   SARS Coronavirus 2 by RT PCR POSITIVE (A) NEGATIVE Final    Comment: RESULT CALLED TO, READ BACK BY AND VERIFIED WITH: ALYCIA BEVERLY @2310  ON 09/07/21 SK[facility] (NOTE) SARS-CoV-2 target nucleic acids are DETECTED.  The SARS-CoV-2 RNA is generally detectable in upper respiratory specimens during the acute phase of infection. Positive results are indicative of the presence of the identified virus, but do not rule out bacterial infection or co-infection with other pathogens not detected by the test. Clinical correlation with patient history  and other diagnostic information is necessary to determine patient infection status. The expected result is Negative.  Fact Sheet for Patients: EntrepreneurPulse.com.au  Fact Sheet for Healthcare Providers: IncredibleEmployment.be  This test is not yet approved or cleared by the [place] FDA and  has been authorized for detection and/or diagnosis of SARS-CoV-2 by FDA under an Emergency Use Authorization (EUA).  This EUA will remain in effect (meaning this test can  be used) for the duration of  the COVID-19 declaration under Section 564(b)(1) of the Act, 21 U.S.C. section 360bbb-3(b)(1), unless the authorization is terminated or revoked sooner.     Influenza A by PCR NEGATIVE NEGATIVE Final   Influenza B by PCR NEGATIVE NEGATIVE Final    Comment: (NOTE) The Xpert Xpress SARS-CoV-2/FLU/RSV plus assay is intended as an aid in the diagnosis of influenza from Nasopharyngeal swab specimens and should not be used as a sole basis for treatment. Nasal washings and aspirates are unacceptable for Xpert Xpress SARS-CoV-2/FLU/RSV testing.  Fact Sheet for Patients: EntrepreneurPulse.com.au  Fact Sheet for Healthcare  Providers: IncredibleEmployment.be  This test is not yet approved or cleared by the [place] FDA and has been authorized for detection and/or diagnosis of SARS-CoV-2 by FDA under an Emergency Use Authorization (EUA). This EUA will remain in effect (meaning this test can be used) for the duration of the COVID-19 declaration under Section 564(b)(1) of the Act, 21 U.S.C. section 360bbb-3(b)(1), unless the authorization is terminated or revoked.  Performed at [facility], [address] [place], [place] [address]          Radiology Studies: CT ABDOMEN PELVIS WO CONTRAST  Result Date: 09/10/2021 CLINICAL DATA:  87 year old female with history of colorectal cancer. Evaluate for potential mass versus hard stool in the left lower quadrant. EXAM: CT ABDOMEN AND PELVIS WITHOUT CONTRAST TECHNIQUE: Multidetector CT imaging of the abdomen and pelvis was performed following the standard protocol without IV contrast. COMPARISON:  No priors. FINDINGS: Lower chest: Trace bilateral pleural effusions lying dependently. Atherosclerotic calcifications in the distal descending thoracic aorta as well as the right coronary artery. Hepatobiliary: Multiple low-attenuation lesions noted throughout the hepatic parenchyma, incompletely characterized on today's non-contrast CT examination, but statistically likely to represent cysts. Multiple calcified and partially calcified gallstones are noted in the lumen of the gallbladder. Gallbladder is nearly decompressed, and otherwise unremarkable in appearance. Pancreas: No definite pancreatic mass or peripancreatic fluid collections or inflammatory changes are noted on today's noncontrast CT examination. Spleen: Unremarkable. Adrenals/Urinary Tract: Exophytic low-attenuation lesions in the kidneys bilaterally, incompletely characterized but statistically likely to represent cysts, largest of which is on the right side in the interpolar region  posterolaterally measuring 1.8 x 1.4 cm. No hydroureteronephrosis. Small amount of gas non dependently in the lumen of the urinary bladder. Urinary bladder is otherwise unremarkable in appearance on today's noncontrast examination. Calcifications in the right adrenal gland, likely related to remote right adrenal hemorrhage or infection. Left adrenal gland is unremarkable in appearance. Stomach/Bowel: Unenhanced appearance of the stomach is normal. No pathologic dilatation of small bowel or colon. Postoperative changes in the low anatomic pelvis likely from prior low anterior resection, poorly demonstrated on today's examination secondary to beam hardening artifact from multiple metallic clips. Left lower quadrant colostomy with large peristomal hernia containing multiple loops of small bowel. No other unexpected soft tissue mass noted in the left lower quadrant of the abdomen/pelvis. Normal appendix. Vascular/Lymphatic: Aortic atherosclerosis. No definite lymphadenopathy noted in the abdomen or pelvis on today's noncontrast examination. Reproductive:  Uterus and ovaries are not confidently identified may be surgically absent or atrophic. Other: No significant volume of ascites.  No pneumoperitoneum. Musculoskeletal: Postoperative changes of recent ORIF in the left hip with extensive gas in the deep musculature of the left gluteal region and upper thigh, and soft tissue stranding in the overlying subcutaneous fat with adjacent skin staple. Old healed fracture of the left inferior pubic ramus. There are no aggressive appearing lytic or blastic lesions noted in the visualized portions of the skeleton. IMPRESSION: 1. The only mass in the left lower quadrant is a large peristomal hernia associated with the patient's left lower quadrant colostomy which contains multiple loops of small bowel and colon. 2. Postoperative changes of recent left hip ORIF, as above. 3. Trace bilateral pleural effusions lying dependently. 4.  Small amount of gas non dependently in the lumen of the urinary bladder. This is presumably iatrogenic related to recent catheterization in this patient who has recently undergone surgery on the left hip. If there is no recent history of catheterization, correlation with urinalysis would be recommended to exclude the possibility of urinary tract infection with gas-forming organisms. 5. Aortic atherosclerosis. 6. Additional incidental findings, as above. Electronically Signed   By: Vinnie Langton M.D.   On: 09/10/2021 15:02        Scheduled Meds:  acetaminophen  1,000 mg Oral Q8H   Chlorhexidine Gluconate Cloth  6 each Topical Daily   enoxaparin (LOVENOX) injection  30 mg Subcutaneous Q24H   pantoprazole (PROTONIX) IV  40 mg Intravenous Q12H   polyethylene glycol  17 g Oral BID   QUEtiapine  12.5 mg Oral QHS   senna-docusate  2 tablet Oral BID   sertraline  50 mg Oral Daily   simvastatin  20 mg Oral QHS   Continuous Infusions:  methocarbamol (ROBAXIN) IV       LOS: 4 days    Time spent: 32 minutes, more than 50% time involving direct patient care.    Sharen Hones, MD [facility]   To contact the attending provider between 7A-7P or the covering provider during after hours 7P-7A, please log into the web site [email or web address] and access using universal [place] password for that web site. If you do not have the password, please call the hospital operator.  09/11/2021, 11:07 AM

## 2021-09-12 DIAGNOSIS — S72142A Displaced intertrochanteric fracture of left femur, initial encounter for closed fracture: Secondary | ICD-10-CM | POA: Diagnosis not present

## 2021-09-12 DIAGNOSIS — D62 Acute posthemorrhagic anemia: Secondary | ICD-10-CM | POA: Diagnosis not present

## 2021-09-12 DIAGNOSIS — S72002S Fracture of unspecified part of neck of left femur, sequela: Secondary | ICD-10-CM | POA: Diagnosis not present

## 2021-09-12 DIAGNOSIS — L8990 Pressure ulcer of unspecified site, unspecified stage: Secondary | ICD-10-CM | POA: Insufficient documentation

## 2021-09-12 DIAGNOSIS — U071 COVID-19: Secondary | ICD-10-CM | POA: Diagnosis not present

## 2021-09-12 LAB — HOMOCYSTEINE: Homocysteine: 23.4 umol/L — ABNORMAL HIGH (ref 0.0–21.3)

## 2021-09-12 MED ORDER — ENSURE ENLIVE PO LIQD
237.0000 mL | Freq: Two times a day (BID) | ORAL | Status: DC
  Administered 2021-09-12 – 2021-09-13 (×2): 237 mL via ORAL

## 2021-09-12 MED ORDER — CYANOCOBALAMIN 1000 MCG/ML IJ SOLN
1000.0000 ug | Freq: Once | INTRAMUSCULAR | Status: AC
  Administered 2021-09-12: 1000 ug via INTRAMUSCULAR
  Filled 2021-09-12: qty 1

## 2021-09-12 NOTE — Progress Notes (Signed)
Physical Therapy Treatment Patient Details Name: Autumn Johnston MRN: [id number] DOB: Nov 18, 1922 Today's Date: 09/12/2021   History of Present Illness Pt is a 87 year old female with history of colon cancer status post colostomy, chronic kidney disease stage IIIb, hypertension hyperlipidemia coming in after a fall and found to have a left hip fracture.  S/p L hip IM nail on 09/08/2021.    PT Comments    Pt was pleasant and motivated to participate during the session and put forth good effort throughout. Pt required decreased physical assistance with bed mobility tasks and was steady with transfers and gait.  Pt ambulated with step-through pattern but with decreased RLE step length with SpO2 100% and HR WNL.  Pt fatigued during amb after a max of 12 feet and needed several minutes in sitting before able to ambulate a second time. Pt now POD#4 with d/c to SNF delayed secondary to Covid as well as has had some difficulty tolerating BID secondary to fatigue after AM session, will decrease frequency to QD. Pt will benefit from PT services in a SNF setting upon discharge to safely address deficits listed in patient problem list for decreased caregiver assistance and eventual return to PLOF.     Recommendations for follow up therapy are one component of a multi-disciplinary discharge planning process, led by the attending physician.  Recommendations may be updated based on patient status, additional functional criteria and insurance authorization.  Follow Up Recommendations  SNF;Supervision/Assistance - 24 hour     Equipment Recommendations  Other (comment) (TBD at next venue of care)    Recommendations for Other Services       Precautions / Restrictions Precautions Precautions: Fall Restrictions Weight Bearing Restrictions: Yes LLE Weight Bearing: Weight bearing as tolerated Other Position/Activity Restrictions: Colostomy bag     Mobility  Bed Mobility Overal bed mobility: Needs  Assistance       Supine to sit: Min assist     General bed mobility comments: Min A for LLE control, improved from prior sessions    Transfers Overall transfer level: Needs assistance Equipment used: Rolling walker (2 wheeled) Transfers: Sit to/from Stand Sit to Stand: From elevated surface;Min guard         General transfer comment: No physical assistance needed to come to standing this session  Ambulation/Gait Ambulation/Gait assistance: Min guard Gait Distance (Feet): 12 Feet x 1, 10 Feet x 1 Assistive device: Rolling walker (2 wheeled) Gait Pattern/deviations: Antalgic;Decreased stance time - left;Trunk flexed;Step-through pattern;Decreased step length - right Gait velocity: decreased   General Gait Details: Step-through pattern but with decreased RLE step length; pt steady without LOB with SpO2 100% and HR WNL   Stairs             Wheelchair Mobility    Modified Rankin (Stroke Patients Only)       Balance Overall balance assessment: Needs assistance Sitting-balance support: No upper extremity supported;Feet supported Sitting balance-Leahy Scale: Good     Standing balance support: Bilateral upper extremity supported;During functional activity Standing balance-Leahy Scale: Fair Standing balance comment: Min to mod lean on the RW for support                            Cognition Arousal/Alertness: Awake/alert Behavior During Therapy: WFL for tasks assessed/performed Overall Cognitive Status: Difficult to assess  Exercises Total Joint Exercises Ankle Circles/Pumps: AROM;Strengthening;Both;10 reps Quad Sets: Strengthening;Both;10 reps Gluteal Sets: Strengthening;Both;10 reps Hip ABduction/ADduction: AROM;AAROM;Strengthening;Both;5 reps Straight Leg Raises: Both;AAROM;Strengthening;5 reps Long Arc Quad: AROM;Strengthening;Both;10 reps;5 reps Knee Flexion: AROM;Strengthening;Both;5  reps;10 reps    General Comments        Pertinent Vitals/Pain Pain Assessment: Faces Pain Score: 6  Pain Location: LLE with mobility Pain Descriptors / Indicators: Sore Pain Intervention(s): Repositioned;Premedicated before session;Monitored during session    Home Living                      Prior Function            PT Goals (current goals can now be found in the care plan section) Progress towards PT goals: Progressing toward goals    Frequency    7X/week      PT Plan Frequency needs to be updated    Co-evaluation              AM-PAC PT "6 Clicks" Mobility   Outcome Measure  Help needed turning from your back to your side while in a flat bed without using bedrails?: A Little Help needed moving from lying on your back to sitting on the side of a flat bed without using bedrails?: A Little Help needed moving to and from a bed to a chair (including a wheelchair)?: A Little Help needed standing up from a chair using your arms (e.g., wheelchair or bedside chair)?: A Little Help needed to walk in hospital room?: A Little Help needed climbing 3-5 steps with a railing? : A Lot 6 Click Score: 17    End of Session Equipment Utilized During Treatment: Gait belt;Other (comment) (Under arms to avoid colostomy) Activity Tolerance: Patient tolerated treatment well Patient left: in chair;with call bell/phone within reach;with chair alarm set;with SCD's reapplied Nurse Communication: Mobility status;Weight bearing status PT Visit Diagnosis: Unsteadiness on feet (R26.81);History of falling (Z91.81);Other abnormalities of gait and mobility (R26.89);Muscle weakness (generalized) (M62.81);Pain Pain - Right/Left: Left Pain - part of body: Hip     Time: 4235-3614 PT Time Calculation (min) (ACUTE ONLY): 38 min  Charges:  $Gait Training: 8-22 mins $Therapeutic Exercise: 8-22 mins $Therapeutic Activity: 8-22 mins                     D. Scott Aizik Reh PT,  DPT 09/12/21, 10:07 AM

## 2021-09-12 NOTE — TOC Progression Note (Signed)
Transition of Care [facility]) - Progression Note    Patient Details  Name: Autumn Johnston MRN: [id number] Date of Birth: [birth year]  [facility]) CM/SW [place], RN Phone Number: 09/12/2021, 10:20 AM  Clinical Narrative:    This morning there are no bed offers, I resent the referral request asking for a bed out to the facilities thru the [facility]        Expected Discharge Plan and Services                                                 Social Determinants of Health (SDOH) Interventions    Readmission Risk Interventions No flowsheet data found.

## 2021-09-12 NOTE — Progress Notes (Signed)
Occupational Therapy Treatment Patient Details Name: Autumn Johnston MRN: [id number] DOB: 15-Mar-1922 Today's Date: 09/12/2021   History of present illness Pt is a 87 year old female with history of colon cancer status post colostomy, chronic kidney disease stage IIIb, hypertension hyperlipidemia coming in after a fall and found to have a left hip fracture.  S/p L hip IM nail on 09/08/2021.   OT comments  [name] was seen for OT treatment on this date. Upon arrival to room pt reclined in chair, bottom near edge of seat, daughter in room. Pt requires CGA + RW for BSC t/f, toelrated x3 bouts of ~46ft mobility in room. SETUP pericare seated on BSC. Pt making good progress toward goals. Pt continues to benefit from skilled OT services to maximize return to PLOF and minimize risk of future falls, injury, caregiver burden, and readmission. Will continue to follow POC. Discharge recommendation remains appropriate.     Recommendations for follow up therapy are one component of a multi-disciplinary discharge planning process, led by the attending physician.  Recommendations may be updated based on patient status, additional functional criteria and insurance authorization.    Follow Up Recommendations  SNF    Equipment Recommendations  3 in 1 bedside commode    Recommendations for Other Services      Precautions / Restrictions Precautions Precautions: Fall Restrictions Weight Bearing Restrictions: Yes LLE Weight Bearing: Weight bearing as tolerated Other Position/Activity Restrictions: Colostomy bag       Mobility Bed Mobility               General bed mobility comments: recieved and left in chair    Transfers Overall transfer level: Needs assistance Equipment used: Rolling walker (2 wheeled) Transfers: Sit to/from Stand Sit to Stand: From elevated surface;Min guard         General transfer comment: No physical assistance needed to come to standing this session     Balance Overall balance assessment: Needs assistance Sitting-balance support: No upper extremity supported;Feet supported Sitting balance-Leahy Scale: Good     Standing balance support: Bilateral upper extremity supported;During functional activity Standing balance-Leahy Scale: Fair Standing balance comment: Min to mod lean on the RW for support                           ADL either performed or assessed with clinical judgement   ADL Overall ADL's : Needs assistance/impaired                                       General ADL Comments: CGA + RW for BSC t/f, toelrated x3 bouts of ~69ft mobility in room. SETUP pericare seated on BSC.      Cognition Arousal/Alertness: Awake/alert Behavior During Therapy: WFL for tasks assessed/performed Overall Cognitive Status: Within Functional Limits for tasks assessed                                 General Comments: follows commands with repetition, very HOH        Exercises Exercises: Other exercises Other Exercises: Dtr edcuated re: OT role, DME recs, d/c recs, falls prevention, ECS Other Exercises: sit<>stand, sitting/standing balance/tolerance, ~6 ft x3 mobility       Pertinent Vitals/ Pain       Pain Assessment: No/denies pain   Frequency  Min 2X/week  Progress Toward Goals  OT Goals(current goals can now be found in the care plan section)  Progress towards OT goals: Progressing toward goals  Acute Rehab OT Goals Patient Stated Goal: To feel better then go home OT Goal Formulation: With patient Time For Goal Achievement: 09/24/21 Potential to Achieve Goals: Good ADL Goals Pt Will Perform Grooming: standing;with min guard assist Pt Will Perform Lower Body Dressing: sit to/from stand;with supervision Pt Will Transfer to Toilet: with supervision;ambulating;regular height toilet  Plan Discharge plan remains appropriate;Frequency remains appropriate    Co-evaluation                  AM-PAC OT "6 Clicks" Daily Activity     Outcome Measure   Help from another person eating meals?: A Little Help from another person taking care of personal grooming?: A Little Help from another person toileting, which includes using toliet, bedpan, or urinal?: A Little Help from another person bathing (including washing, rinsing, drying)?: A Lot Help from another person to put on and taking off regular upper body clothing?: A Little Help from another person to put on and taking off regular lower body clothing?: A Lot 6 Click Score: 16    End of Session Equipment Utilized During Treatment: Rolling walker  OT Visit Diagnosis: Other abnormalities of gait and mobility (R26.89);Muscle weakness (generalized) (M62.81)   Activity Tolerance Patient tolerated treatment well   Patient Left in bed;with call bell/phone within reach;with bed alarm set   Nurse Communication Mobility status        Time: 8469-6295 OT Time Calculation (min): 26 min  Charges: OT General Charges $OT Visit: 1 Visit OT Treatments $Self Care/Home Management : 23-37 mins  Dessie Coma, M.S. OTR/L  09/12/21, 1:55 PM  ascom [phone]

## 2021-09-12 NOTE — Progress Notes (Addendum)
PROGRESS NOTE    Autumn Johnston  [id number] DOB: 1922-06-11 DOA: 09/07/2021 PCP: Idelle Crouch, MD    Brief Narrative:  87 year old female with history of colon cancer status post colostomy, chronic kidney disease stage IIIb, hypertension hyperlipidemia coming in after a fall and found to have a left hip fracture.  Patient had surgery on 09/08/2021.  Hemoglobin dropped down to 5.5 postoperatively and given 1 unit of packed red blood cells and hemoglobin came up to 8.1 this afternoon.  CT scan abdomen pelvis showed peristomal hernia.   Assessment & Plan:   Principal Problem:   Closed hip fracture requiring operative repair, left, sequela Active Problems:   Anemia of chronic disease   Essential hypertension   Chronic kidney disease (CKD), stage IV (severe) (HCC)   Fall at home, initial encounter   Acute blood loss anemia   COVID-19 virus infection   Left lower quadrant abdominal mass   Hypotension   Pressure injury of skin  Left hip fracture s/p repair 9/19. Acute blood loss anemia. Iron deficient anemia. Patient received IV iron, recheck a CBC tomorrow. Will give another dose of B12 injection while pending homocystine level.  Patient B12 level is borderline low.  Asymptomatic COVID infection. No need for treatment.  Constipation. Peristomal hernia Patient had a bowel movements.  Chronic kidney disease stage IIIb. Renal function still stable.  Stage 2 pressure injury to sacrum. POA  DVT prophylaxis: Home  Code Status: SNF Family Communication:  Disposition Plan:    Status is: Inpatient  Remains inpatient appropriate because:Unsafe d/c plan  Dispo: The patient is from: Home              Anticipated d/c is to: SNF              Patient currently is medically stable to d/c.   Difficult to place patient No        No intake/output data recorded. No intake/output data recorded.     Consultants:  Ortho  Procedures: Hip  Antimicrobials:  None  Subjective: Patient doing well.  Denies any short of breath or cough. No abdominal pain or nausea vomiting. No fever or chills. No headache or dizziness. No dysuria hematuria.  Objective: Vitals:   09/11/21 1941 09/12/21 0009 09/12/21 0456 09/12/21 0850  BP: (!) 181/67 (!) 102/53 130/78 (!) 147/72  Pulse: 84 71 75 83  Resp: 16 16 16 16   Temp: 98.9 F (37.2 C) 99.1 F (37.3 C) (!) 97.2 F (36.2 C) (!) 97.5 F (36.4 C)  TempSrc:      SpO2: 99% 97% 98% 98%  Weight:      Height:       No intake or output data in the 24 hours ending 09/12/21 1217 Filed Weights   09/07/21 2018  Weight: 82.1 kg    Examination:  General exam: Appears calm and comfortable  Respiratory system: Clear to auscultation. Respiratory effort normal. Cardiovascular system: S1 & S2 heard, RRR. No JVD, murmurs, rubs, gallops or clicks. No pedal edema. Gastrointestinal system: Abdomen is nondistended, soft and nontender. No organomegaly or masses felt. Normal bowel sounds heard. Central nervous system: Alert and oriented x1. No focal neurological deficits. Extremities: Symmetric 5 x 5 power. Skin: No rashes, lesions or ulcers Psychiatry: Mood & affect appropriate.     Data Reviewed: I have personally reviewed following labs and imaging studies  CBC: Recent Labs  Lab 09/07/21 2027 09/08/21 0813 09/09/21 0511 09/09/21 1509 09/10/21 0517 09/11/21 0421  WBC 7.5  7.0 5.0  --  5.6 6.0  NEUTROABS 4.9  --   --   --   --   --   HGB 8.6* 8.1* 5.5* 8.1* 7.8* 8.7*  HCT 26.1* 24.9* 17.3*  --  22.9* 25.6*  MCV 93.2 93.3 93.5  --  87.7 88.3  PLT 245 258 168  --  188 035   Basic Metabolic Panel: Recent Labs  Lab 09/07/21 2027 09/08/21 0813 09/09/21 0511 09/10/21 0517 09/11/21 0421  NA 133* 132* 136 134* 133*  K 4.4 4.3 4.7 4.3 4.0  CL 101 101 109 105 104  CO2 23 20* 22 22 21*  GLUCOSE 123* 148* 120* 86 84  BUN 39* 38* 38* 37* 25*  CREATININE 1.55* 1.50* 1.48* 1.60* 1.29*  CALCIUM 8.5*  8.5* 7.5* 7.7* 8.0*  MG  --   --   --   --  1.8   GFR: Estimated Creatinine Clearance: 25.9 mL/min (A) (by C-G formula based on SCr of 1.29 mg/dL (H)). Liver Function Tests: Recent Labs  Lab 09/07/21 2027  AST 23  ALT 13  ALKPHOS 60  BILITOT 0.7  PROT 6.2*  ALBUMIN 3.2*   No results for input(s): LIPASE, AMYLASE in the last 168 hours. No results for input(s): AMMONIA in the last 168 hours. Coagulation Profile: Recent Labs  Lab 09/07/21 2027 09/08/21 0813  INR 1.1 1.0   Cardiac Enzymes: No results for input(s): CKTOTAL, CKMB, CKMBINDEX, TROPONINI in the last 168 hours. BNP (last 3 results) No results for input(s): PROBNP in the last 8760 hours. HbA1C: No results for input(s): HGBA1C in the last 72 hours. CBG: No results for input(s): GLUCAP in the last 168 hours. Lipid Profile: No results for input(s): CHOL, HDL, LDLCALC, TRIG, CHOLHDL, LDLDIRECT in the last 72 hours. Thyroid Function Tests: No results for input(s): TSH, T4TOTAL, FREET4, T3FREE, THYROIDAB in the last 72 hours. Anemia Panel: No results for input(s): VITAMINB12, FOLATE, FERRITIN, TIBC, IRON, RETICCTPCT in the last 72 hours. Sepsis Labs: No results for input(s): PROCALCITON, LATICACIDVEN in the last 168 hours.  Recent Results (from the past 240 hour(s))  Resp Panel by RT-PCR (Flu A&B, Covid) Nasopharyngeal Swab     Status: Abnormal   Collection Time: 09/07/21 10:07 PM   Specimen: Nasopharyngeal Swab; Nasopharyngeal(NP) swabs in vial transport medium  Result Value Ref Range Status   SARS Coronavirus 2 by RT PCR POSITIVE (A) NEGATIVE Final    Comment: RESULT CALLED TO, READ BACK BY AND VERIFIED WITH: ALYCIA BEVERLY @2310  ON 09/07/21 SKL (NOTE) SARS-CoV-2 target nucleic acids are DETECTED.  The SARS-CoV-2 RNA is generally detectable in upper respiratory specimens during the acute phase of infection. Positive results are indicative of the presence of the identified virus, but do not rule out bacterial  infection or co-infection with other pathogens not detected by the test. Clinical correlation with patient history and other diagnostic information is necessary to determine patient infection status. The expected result is Negative.  Fact Sheet for Patients: EntrepreneurPulse.com.au  Fact Sheet for Healthcare Providers: IncredibleEmployment.be  This test is not yet approved or cleared by the [place] FDA and  has been authorized for detection and/or diagnosis of SARS-CoV-2 by FDA under an Emergency Use Authorization (EUA).  This EUA will remain in effect (meaning this test can  be used) for the duration of  the COVID-19 declaration under Section 564(b)(1) of the Act, 21 U.S.C. section 360bbb-3(b)(1), unless the authorization is terminated or revoked sooner.     Influenza A by  PCR NEGATIVE NEGATIVE Final   Influenza B by PCR NEGATIVE NEGATIVE Final    Comment: (NOTE) The Xpert Xpress SARS-CoV-2/FLU/RSV plus assay is intended as an aid in the diagnosis of influenza from Nasopharyngeal swab specimens and should not be used as a sole basis for treatment. Nasal washings and aspirates are unacceptable for Xpert Xpress SARS-CoV-2/FLU/RSV testing.  Fact Sheet for Patients: EntrepreneurPulse.com.au  Fact Sheet for Healthcare Providers: IncredibleEmployment.be  This test is not yet approved or cleared by the [place] FDA and has been authorized for detection and/or diagnosis of SARS-CoV-2 by FDA under an Emergency Use Authorization (EUA). This EUA will remain in effect (meaning this test can be used) for the duration of the COVID-19 declaration under Section 564(b)(1) of the Act, 21 U.S.C. section 360bbb-3(b)(1), unless the authorization is terminated or revoked.  Performed at [facility], [address] [place], [place] [address]          Radiology Studies: No results  found.      Scheduled Meds:  acetaminophen  1,000 mg Oral Q8H   Chlorhexidine Gluconate Cloth  6 each Topical Daily   cyanocobalamin  1,000 mcg Intramuscular Once   enoxaparin (LOVENOX) injection  30 mg Subcutaneous Q24H   feeding supplement  237 mL Oral BID BM   pantoprazole (PROTONIX) IV  40 mg Intravenous Q12H   polyethylene glycol  17 g Oral BID   QUEtiapine  12.5 mg Oral QHS   senna-docusate  2 tablet Oral BID   sertraline  50 mg Oral Daily   simvastatin  20 mg Oral QHS   Continuous Infusions:  methocarbamol (ROBAXIN) IV       LOS: 5 days    Time spent: 27 minutes    Sharen Hones, MD [facility]   To contact the attending provider between 7A-7P or the covering provider during after hours 7P-7A, please log into the web site [email or web address] and access using universal [place] password for that web site. If you do not have the password, please call the hospital operator.  09/12/2021, 12:17 PM

## 2021-09-12 NOTE — TOC Progression Note (Signed)
Transition of Care [facility]) - Progression Note    Patient Details  Name: Autumn Johnston MRN: [id number] Date of Birth: 04-18-1922  Transition of Care [facility]) CM/SW [place], RN Phone Number: 09/12/2021, 3:53 PM  Clinical Narrative:    Reached out to [facility] and [place] [facility] to confirm bed offer with Covid Positive, awaiting a call back        Expected Discharge Plan and Services                                                 Social Determinants of Health (SDOH) Interventions    Readmission Risk Interventions No flowsheet data found.

## 2021-09-13 DIAGNOSIS — D62 Acute posthemorrhagic anemia: Secondary | ICD-10-CM | POA: Diagnosis not present

## 2021-09-13 DIAGNOSIS — S72142A Displaced intertrochanteric fracture of left femur, initial encounter for closed fracture: Secondary | ICD-10-CM | POA: Diagnosis not present

## 2021-09-13 DIAGNOSIS — U071 COVID-19: Secondary | ICD-10-CM | POA: Diagnosis not present

## 2021-09-13 DIAGNOSIS — S72002S Fracture of unspecified part of neck of left femur, sequela: Secondary | ICD-10-CM | POA: Diagnosis not present

## 2021-09-13 LAB — BASIC METABOLIC PANEL
Anion gap: 8 (ref 5–15)
BUN: 29 mg/dL — ABNORMAL HIGH (ref 8–23)
CO2: 25 mmol/L (ref 22–32)
Calcium: 8.2 mg/dL — ABNORMAL LOW (ref 8.9–10.3)
Chloride: 100 mmol/L (ref 98–111)
Creatinine, Ser: 1.14 mg/dL — ABNORMAL HIGH (ref 0.44–1.00)
GFR, Estimated: 44 mL/min — ABNORMAL LOW (ref >60)
Glucose, Bld: 88 mg/dL (ref 70–99)
Potassium: 3.9 mmol/L (ref 3.5–5.1)
Sodium: 133 mmol/L — ABNORMAL LOW (ref 135–145)

## 2021-09-13 LAB — CBC WITH DIFFERENTIAL/PLATELET
Abs Immature Granulocytes: 0.09 10*3/uL — ABNORMAL HIGH (ref 0.00–0.07)
Basophils Absolute: 0 10*3/uL (ref 0.0–0.1)
Basophils Relative: 0 %
Eosinophils Absolute: 0.1 10*3/uL (ref 0.0–0.5)
Eosinophils Relative: 2 %
HCT: 22.9 % — ABNORMAL LOW (ref 36.0–46.0)
Hemoglobin: 7.7 g/dL — ABNORMAL LOW (ref 12.0–15.0)
Immature Granulocytes: 2 %
Lymphocytes Relative: 26 %
Lymphs Abs: 1.5 10*3/uL (ref 0.7–4.0)
MCH: 30.8 pg (ref 26.0–34.0)
MCHC: 33.6 g/dL (ref 30.0–36.0)
MCV: 91.6 fL (ref 80.0–100.0)
Monocytes Absolute: 0.5 10*3/uL (ref 0.1–1.0)
Monocytes Relative: 9 %
Neutro Abs: 3.5 10*3/uL (ref 1.7–7.7)
Neutrophils Relative %: 61 %
Platelets: 252 10*3/uL (ref 150–400)
RBC: 2.5 MIL/uL — ABNORMAL LOW (ref 3.87–5.11)
RDW: 14.8 % (ref 11.5–15.5)
WBC: 5.7 10*3/uL (ref 4.0–10.5)
nRBC: 0 % (ref 0.0–0.2)

## 2021-09-13 MED ORDER — CYANOCOBALAMIN 1000 MCG/ML IJ SOLN
1000.0000 ug | Freq: Once | INTRAMUSCULAR | Status: AC
  Administered 2021-09-13: 1000 ug via INTRAMUSCULAR
  Filled 2021-09-13: qty 1

## 2021-09-13 MED ORDER — ENSURE ENLIVE PO LIQD
237.0000 mL | Freq: Three times a day (TID) | ORAL | Status: DC
  Administered 2021-09-13 – 2021-09-18 (×13): 237 mL via ORAL

## 2021-09-13 MED ORDER — PANTOPRAZOLE SODIUM 40 MG PO TBEC
40.0000 mg | DELAYED_RELEASE_TABLET | Freq: Two times a day (BID) | ORAL | Status: DC
  Administered 2021-09-13 – 2021-09-18 (×10): 40 mg via ORAL
  Filled 2021-09-13 (×10): qty 1

## 2021-09-13 NOTE — Progress Notes (Signed)
PROGRESS NOTE    Autumn Johnston  XMI:[id number] DOB: [birth year] DOA: 09/07/2021 PCP: Idelle Crouch, MD    Brief Narrative:   87 year old female with history of colon cancer status post colostomy, chronic kidney disease stage IIIb, hypertension hyperlipidemia coming in after a fall and found to have a left hip fracture.  Patient had surgery on 09/08/2021.  Hemoglobin dropped down to 5.5 postoperatively and given 1 unit of packed red blood cells and hemoglobin came up to 8.1 this afternoon.  CT scan abdomen pelvis showed peristomal hernia.  Assessment & Plan:   Principal Problem:   Closed hip fracture requiring operative repair, left, sequela Active Problems:   Anemia of chronic disease   Essential hypertension   Chronic kidney disease (CKD), stage IV (severe) (HCC)   Fall at home, initial encounter   Acute blood loss anemia   COVID-19 virus infection   Left lower quadrant abdominal mass   Hypotension   Pressure injury of skin  Left hip fracture s/p repair 9/19. Acute blood loss anemia. Iron deficient anemia. Vitamin B12 deficient anemia. Condition stable, received IV iron, will give a third dose of B12 injection as patient homocystine level is elevated.  Then we will start oral B12 tomorrow. Hemoglobin is lower today, recheck level tomorrow.  Asymptomatic COVID infection. No need for treatment.  Constipation. Peristomal hernia. Patient had a bowel movements.  Chronic kidney disease stage IIIb. Renal function stable.    DVT prophylaxis: Home  Code Status: SNF Family Communication:  Disposition Plan:      Status is: Inpatient   Remains inpatient appropriate because:Unsafe d/c plan   Dispo: The patient is from: Home              Anticipated d/c is to: SNF              Patient currently is medically stable to d/c.              Difficult to place patient No        I/O last 3 completed shifts: In: -  Out: 250 [Stool:250] Total I/O In: 240  [P.O.:240] Out: 250 [Stool:250]      Consultants:  Ortho   Procedures: Hip   Antimicrobials: None   Subjective: Spoke with the nurse, patient was drinking all her insurance, but does not have appetite to eat solid food. No abdominal pain or nausea vomiting. No short of breath or cough. No dysuria hematuria  Objective: Vitals:   09/12/21 2057 09/12/21 2347 09/13/21 0521 09/13/21 0831  BP: (!) 169/87 (!) 146/77 (!) 133/55 138/62  Pulse: 91 81 72 70  Resp: 20  20 18   Temp: 98.6 F (37 C)  98.3 F (36.8 C) (!) 97.5 F (36.4 C)  TempSrc:      SpO2: 98%  100% 99%  Weight:      Height:        Intake/Output Summary (Last 24 hours) at 09/13/2021 1009 Last data filed at 09/13/2021 1005 Gross per 24 hour  Intake 240 ml  Output 500 ml  Net -260 ml   Filed Weights   09/07/21 2018  Weight: 82.1 kg    Examination:  General exam: Appears calm and comfortable  Respiratory system: Clear to auscultation. Respiratory effort normal. Cardiovascular system: S1 & S2 heard, RRR. No JVD, murmurs, rubs, gallops or clicks. No pedal edema. Gastrointestinal system: Abdomen is nondistended, soft and nontender. No organomegaly or masses felt. Normal bowel sounds heard. Central nervous system: Alert and oriented  x2. No focal neurological deficits. Extremities: Symmetric 5 x 5 power. Skin: No rashes, lesions or ulcers Psychiatry: Mood & affect appropriate.     Data Reviewed: I have personally reviewed following labs and imaging studies  CBC: Recent Labs  Lab 09/07/21 2027 09/08/21 0813 09/09/21 0511 09/09/21 1509 09/10/21 0517 09/11/21 0421 09/13/21 0537  WBC 7.5 7.0 5.0  --  5.6 6.0 5.7  NEUTROABS 4.9  --   --   --   --   --  3.5  HGB 8.6* 8.1* 5.5* 8.1* 7.8* 8.7* 7.7*  HCT 26.1* 24.9* 17.3*  --  22.9* 25.6* 22.9*  MCV 93.2 93.3 93.5  --  87.7 88.3 91.6  PLT 245 258 168  --  188 222 176   Basic Metabolic Panel: Recent Labs  Lab 09/08/21 0813 09/09/21 0511  09/10/21 0517 09/11/21 0421 09/13/21 0537  NA 132* 136 134* 133* 133*  K 4.3 4.7 4.3 4.0 3.9  CL 101 109 105 104 100  CO2 20* 22 22 21* 25  GLUCOSE 148* 120* 86 84 88  BUN 38* 38* 37* 25* 29*  CREATININE 1.50* 1.48* 1.60* 1.29* 1.14*  CALCIUM 8.5* 7.5* 7.7* 8.0* 8.2*  MG  --   --   --  1.8  --    GFR: Estimated Creatinine Clearance: 29.3 mL/min (A) (by C-G formula based on SCr of 1.14 mg/dL (H)). Liver Function Tests: Recent Labs  Lab 09/07/21 2027  AST 23  ALT 13  ALKPHOS 60  BILITOT 0.7  PROT 6.2*  ALBUMIN 3.2*   No results for input(s): LIPASE, AMYLASE in the last 168 hours. No results for input(s): AMMONIA in the last 168 hours. Coagulation Profile: Recent Labs  Lab 09/07/21 2027 09/08/21 0813  INR 1.1 1.0   Cardiac Enzymes: No results for input(s): CKTOTAL, CKMB, CKMBINDEX, TROPONINI in the last 168 hours. BNP (last 3 results) No results for input(s): PROBNP in the last 8760 hours. HbA1C: No results for input(s): HGBA1C in the last 72 hours. CBG: No results for input(s): GLUCAP in the last 168 hours. Lipid Profile: No results for input(s): CHOL, HDL, LDLCALC, TRIG, CHOLHDL, LDLDIRECT in the last 72 hours. Thyroid Function Tests: No results for input(s): TSH, T4TOTAL, FREET4, T3FREE, THYROIDAB in the last 72 hours. Anemia Panel: No results for input(s): VITAMINB12, FOLATE, FERRITIN, TIBC, IRON, RETICCTPCT in the last 72 hours. Sepsis Labs: No results for input(s): PROCALCITON, LATICACIDVEN in the last 168 hours.  Recent Results (from the past 240 hour(s))  Resp Panel by RT-PCR (Flu A&B, Covid) Nasopharyngeal Swab     Status: Abnormal   Collection Time: 09/07/21 10:07 PM   Specimen: Nasopharyngeal Swab; Nasopharyngeal(NP) swabs in vial transport medium  Result Value Ref Range Status   SARS Coronavirus 2 by RT PCR POSITIVE (A) NEGATIVE Final    Comment: RESULT CALLED TO, READ BACK BY AND VERIFIED WITH: ALYCIA BEVERLY @2310  ON 09/07/21  SKL (NOTE) SARS-CoV-2 target nucleic acids are DETECTED.  The SARS-CoV-2 RNA is generally detectable in upper respiratory specimens during the acute phase of infection. Positive results are indicative of the presence of the identified virus, but do not rule out bacterial infection or co-infection with other pathogens not detected by the test. Clinical correlation with patient history and other diagnostic information is necessary to determine patient infection status. The expected result is Negative.  Fact Sheet for Patients: EntrepreneurPulse.com.au  Fact Sheet for Healthcare Providers: IncredibleEmployment.be  This test is not yet approved or cleared by the [place] FDA  and  has been authorized for detection and/or diagnosis of SARS-CoV-2 by FDA under an Emergency Use Authorization (EUA).  This EUA will remain in effect (meaning this test can  be used) for the duration of  the COVID-19 declaration under Section 564(b)(1) of the Act, 21 U.S.C. section 360bbb-3(b)(1), unless the authorization is terminated or revoked sooner.     Influenza A by PCR NEGATIVE NEGATIVE Final   Influenza B by PCR NEGATIVE NEGATIVE Final    Comment: (NOTE) The Xpert Xpress SARS-CoV-2/FLU/RSV plus assay is intended as an aid in the diagnosis of influenza from Nasopharyngeal swab specimens and should not be used as a sole basis for treatment. Nasal washings and aspirates are unacceptable for Xpert Xpress SARS-CoV-2/FLU/RSV testing.  Fact Sheet for Patients: EntrepreneurPulse.com.au  Fact Sheet for Healthcare Providers: IncredibleEmployment.be  This test is not yet approved or cleared by the [place] FDA and has been authorized for detection and/or diagnosis of SARS-CoV-2 by FDA under an Emergency Use Authorization (EUA). This EUA will remain in effect (meaning this test can be used) for the duration of the COVID-19  declaration under Section 564(b)(1) of the Act, 21 U.S.C. section 360bbb-3(b)(1), unless the authorization is terminated or revoked.  Performed at [facility], [address], [place], [place] [address]          Radiology Studies: No results found.      Scheduled Meds:  acetaminophen  1,000 mg Oral Q8H   Chlorhexidine Gluconate Cloth  6 each Topical Daily   enoxaparin (LOVENOX) injection  30 mg Subcutaneous Q24H   feeding supplement  237 mL Oral TID BM   pantoprazole (PROTONIX) IV  40 mg Intravenous Q12H   polyethylene glycol  17 g Oral BID   QUEtiapine  12.5 mg Oral QHS   senna-docusate  2 tablet Oral BID   sertraline  50 mg Oral Daily   simvastatin  20 mg Oral QHS   Continuous Infusions:  methocarbamol (ROBAXIN) IV       LOS: 6 days    Time spent: 28 minutes    Sharen Hones, MD [facility]   To contact the attending provider between 7A-7P or the covering provider during after hours 7P-7A, please log into the web site [email or web address] and access using universal [place] password for that web site. If you do not have the password, please call the hospital operator.  09/13/2021, 10:09 AM

## 2021-09-13 NOTE — Progress Notes (Signed)
Physical Therapy Treatment Patient Details Name: Autumn Johnston MRN: [id number] DOB: Nov 04, 1922 Today's Date: 09/13/2021   History of Present Illness [name] is a 51yoF history of colon cancer status post colostomy, CKD3b, HTN, HLD, coming in after a fall and found to have a left hip fracture.  S/p L hip IM nail on 09/08/2021. Pt (+) COVID19, appears to be incidental finding.    PT Comments    Pt in bed at entry, finished eating breakfast, ~50% of meal eaten. Pt agreeable to session. Continues to have good pain control at rest but when moving is much more weak from pain. MinA required for OOB and rising to standing as in previous days AMB progressed to ~71ft, limited by general fatigue and DOE. Gait speed I suspect is quite a bit faster. Will continue to follow.   Recommendations for follow up therapy are one component of a multi-disciplinary discharge planning process, led by the attending physician.  Recommendations may be updated based on patient status, additional functional criteria and insurance authorization.  Follow Up Recommendations  SNF;Supervision/Assistance - 24 hour     Equipment Recommendations       Recommendations for Other Services       Precautions / Restrictions Precautions Precautions: Fall Restrictions Weight Bearing Restrictions: Yes LLE Weight Bearing: Weight bearing as tolerated     Mobility  Bed Mobility Overal bed mobility: Needs Assistance Bed Mobility: Supine to Sit     Supine to sit: Min assist          Transfers Overall transfer level: Needs assistance Equipment used: Rolling walker (2 wheeled) Transfers: Sit to/from Stand Sit to Stand: Min assist         General transfer comment: max effort required, multiple starts  Ambulation/Gait Ambulation/Gait assistance: Min guard;Supervision Gait Distance (Feet): 45 Feet Assistive device: Rolling walker (2 wheeled) Gait Pattern/deviations: Antalgic;WFL(Within Functional  Limits) Gait velocity: 0.26m/s   General Gait Details: obvious antalgia but not anxiety provoking, no grimacing   Stairs             Wheelchair Mobility    Modified Rankin (Stroke Patients Only)       Balance                                            Cognition Arousal/Alertness: Awake/alert Behavior During Therapy: WFL for tasks assessed/performed Overall Cognitive Status: Within Functional Limits for tasks assessed                                 General Comments: disoriented in time; asks how many weeks it's been since her hip surgery      Exercises Total Joint Exercises Heel Slides: AAROM;Left;10 reps;Supine;Limitations Heel Slides Limitations: needs modA or more, pain makes following instructions difficult despite ability to perfrom on Right without confusion. Pt starts out with heel slides and transitions slowly toward SLR each rep Hip ABduction/ADduction: AAROM;Left;10 reps;Supine;Limitations Hip Abduction/Adduction Limitations: modA required General Exercises - Lower Extremity Ankle Circles/Pumps: AROM;Both;10 reps;Supine Heel Slides: AROM;Right;10 reps;Supine Hip ABduction/ADduction: AROM;Right;10 reps;Supine    General Comments        Pertinent Vitals/Pain Pain Assessment: Faces Faces Pain Scale: Hurts even more Pain Location: LLE with mobility Pain Descriptors / Indicators: Operative site guarding Pain Intervention(s): Limited activity within patient's tolerance;Repositioned    Home Living  Prior Function            PT Goals (current goals can now be found in the care plan section) Acute Rehab PT Goals Patient Stated Goal: To feel better then go home PT Goal Formulation: With patient Time For Goal Achievement: 09/23/21 Potential to Achieve Goals: Fair Progress towards PT goals: Progressing toward goals    Frequency    7X/week      PT Plan Current plan remains  appropriate    Co-evaluation              AM-PAC PT "6 Clicks" Mobility   Outcome Measure  Help needed turning from your back to your side while in a flat bed without using bedrails?: A Lot Help needed moving from lying on your back to sitting on the side of a flat bed without using bedrails?: A Lot Help needed moving to and from a bed to a chair (including a wheelchair)?: A Lot Help needed standing up from a chair using your arms (e.g., wheelchair or bedside chair)?: A Lot Help needed to walk in hospital room?: A Little Help needed climbing 3-5 steps with a railing? : A Lot 6 Click Score: 13    End of Session Equipment Utilized During Treatment: Gait belt Activity Tolerance: Patient tolerated treatment well;Patient limited by fatigue Patient left: in chair;with call bell/phone within reach;with chair alarm set   PT Visit Diagnosis: Unsteadiness on feet (R26.81);History of falling (Z91.81);Other abnormalities of gait and mobility (R26.89);Muscle weakness (generalized) (M62.81);Pain Pain - Right/Left: Left Pain - part of body: Hip     Time: 6283-1517 PT Time Calculation (min) (ACUTE ONLY): 27 min  Charges:  $Gait Training: 8-22 mins $Therapeutic Exercise: 8-22 mins                    10:46 AM, 09/13/21 Etta Grandchild, PT, DPT Physical Therapist - [facility]  [phone] ([place])     [employer] 09/13/2021, 10:44 AM

## 2021-09-13 NOTE — Progress Notes (Signed)
  Subjective: 5 Days Post-Op Procedure(s) (LRB): INTRAMEDULLARY (IM) NAIL INTERTROCHANTRIC (Left) Patient reports pain as mild.   Patient is well, and has had no acute complaints or problems Plan is to go Rehab after hospital stay. Negative for chest pain and shortness of breath Fever: no Gastrointestinal:Negative for nausea and vomiting  Objective: Vital signs in last 24 hours: Temp:  [97.5 F (36.4 C)-98.6 F (37 C)] 97.5 F (36.4 C) (09/24 0831) Pulse Rate:  [70-91] 70 (09/24 0831) Resp:  [16-20] 18 (09/24 0831) BP: (133-169)/(55-87) 138/62 (09/24 0831) SpO2:  [98 %-100 %] 99 % (09/24 0831)  Intake/Output from previous day:  Intake/Output Summary (Last 24 hours) at 09/13/2021 1057 Last data filed at 09/13/2021 1005 Gross per 24 hour  Intake 240 ml  Output 500 ml  Net -260 ml    Intake/Output this shift: Total I/O In: 240 [P.O.:240] Out: 250 [Stool:250]  Labs: Recent Labs    09/11/21 0421 09/13/21 0537  HGB 8.7* 7.7*   Recent Labs    09/11/21 0421 09/13/21 0537  WBC 6.0 5.7  RBC 2.90* 2.50*  HCT 25.6* 22.9*  PLT 222 252   Recent Labs    09/11/21 0421 09/13/21 0537  NA 133* 133*  K 4.0 3.9  CL 104 100  CO2 21* 25  BUN 25* 29*  CREATININE 1.29* 1.14*  GLUCOSE 84 88  CALCIUM 8.0* 8.2*   No results for input(s): LABPT, INR in the last 72 hours.    EXAM General - Patient is Alert and Oriented Extremity - ABD soft Neurovascular intact Sensation intact distally Dorsiflexion/Plantar flexion intact Compartment soft Dressing/Incision - clean, dry, scant and blood tinged drainage Motor Function - intact, moving foot and toes well on exam.   Past Medical History:  Diagnosis Date   CKD (chronic kidney disease) stage 4, GFR 15-29 ml/min (HCC)    Colon cancer (HCC)    Hypertension     Assessment/Plan: 5 Days Post-Op Procedure(s) (LRB): INTRAMEDULLARY (IM) NAIL INTERTROCHANTRIC (Left) Principal Problem:   Closed hip fracture requiring operative  repair, left, sequela Active Problems:   Anemia of chronic disease   Essential hypertension   Chronic kidney disease (CKD), stage IV (severe) (HCC)   Fall at home, initial encounter   Acute blood loss anemia   COVID-19 virus infection   Left lower quadrant abdominal mass   Hypotension   Pressure injury of skin  Estimated body mass index is 32.06 kg/m as calculated from the following:   Height as of this encounter: 5\' 3"  (1.6 m).   Weight as of this encounter: 82.1 kg. Advance diet Up with therapy  Vitals stable this morning. Hg 7.7 but denies any dizziness or HA.  Monitor for possible repeat transfusion. Up with therapy as much as possible. Discharge to SNF when bed available.  DVT Prophylaxis - Lovenox, Foot Pumps, and TED hose Weight-Bearing as tolerated to Left leg  J. Cameron Proud, PA-C Orthopaedic Surgery 09/13/2021, 10:57 AM

## 2021-09-13 NOTE — Progress Notes (Signed)
PHARMACIST - PHYSICIAN COMMUNICATION  CONCERNING: IV to Oral Route Change Policy  RECOMMENDATION: This patient is receiving pantoprazole by the intravenous route.  Based on criteria approved by the Pharmacy and Therapeutics Committee, the intravenous medication(s) is/are being converted to the equivalent oral dose form(s).   DESCRIPTION: These criteria include: The patient is eating (either orally or via tube) and/or has been taking other orally administered medications for a least 24 hours The patient has no evidence of active gastrointestinal bleeding or impaired GI absorption (gastrectomy, short bowel, patient on TNA or NPO).  If you have questions about this conversion, please contact the [place], [facility] 09/13/2021 10:17 AM

## 2021-09-14 DIAGNOSIS — S72002S Fracture of unspecified part of neck of left femur, sequela: Secondary | ICD-10-CM | POA: Diagnosis not present

## 2021-09-14 DIAGNOSIS — S72142A Displaced intertrochanteric fracture of left femur, initial encounter for closed fracture: Secondary | ICD-10-CM | POA: Diagnosis not present

## 2021-09-14 DIAGNOSIS — U071 COVID-19: Secondary | ICD-10-CM | POA: Diagnosis not present

## 2021-09-14 DIAGNOSIS — D62 Acute posthemorrhagic anemia: Secondary | ICD-10-CM | POA: Diagnosis not present

## 2021-09-14 LAB — BASIC METABOLIC PANEL
Anion gap: 9 (ref 5–15)
BUN: 35 mg/dL — ABNORMAL HIGH (ref 8–23)
CO2: 24 mmol/L (ref 22–32)
Calcium: 8.3 mg/dL — ABNORMAL LOW (ref 8.9–10.3)
Chloride: 99 mmol/L (ref 98–111)
Creatinine, Ser: 1.25 mg/dL — ABNORMAL HIGH (ref 0.44–1.00)
GFR, Estimated: 39 mL/min — ABNORMAL LOW (ref >60)
Glucose, Bld: 94 mg/dL (ref 70–99)
Potassium: 3.8 mmol/L (ref 3.5–5.1)
Sodium: 132 mmol/L — ABNORMAL LOW (ref 135–145)

## 2021-09-14 LAB — CBC WITH DIFFERENTIAL/PLATELET
Abs Immature Granulocytes: 0.11 10*3/uL — ABNORMAL HIGH (ref 0.00–0.07)
Basophils Absolute: 0 10*3/uL (ref 0.0–0.1)
Basophils Relative: 0 %
Eosinophils Absolute: 0.1 10*3/uL (ref 0.0–0.5)
Eosinophils Relative: 3 %
HCT: 22.1 % — ABNORMAL LOW (ref 36.0–46.0)
Hemoglobin: 7.3 g/dL — ABNORMAL LOW (ref 12.0–15.0)
Immature Granulocytes: 2 %
Lymphocytes Relative: 26 %
Lymphs Abs: 1.4 10*3/uL (ref 0.7–4.0)
MCH: 30.8 pg (ref 26.0–34.0)
MCHC: 33 g/dL (ref 30.0–36.0)
MCV: 93.2 fL (ref 80.0–100.0)
Monocytes Absolute: 0.6 10*3/uL (ref 0.1–1.0)
Monocytes Relative: 11 %
Neutro Abs: 3 10*3/uL (ref 1.7–7.7)
Neutrophils Relative %: 58 %
Platelets: 270 10*3/uL (ref 150–400)
RBC: 2.37 MIL/uL — ABNORMAL LOW (ref 3.87–5.11)
RDW: 14.9 % (ref 11.5–15.5)
WBC: 5.2 10*3/uL (ref 4.0–10.5)
nRBC: 0 % (ref 0.0–0.2)

## 2021-09-14 MED ORDER — TRAZODONE HCL 50 MG PO TABS
25.0000 mg | ORAL_TABLET | Freq: Every evening | ORAL | Status: DC | PRN
  Administered 2021-09-14 – 2021-09-17 (×4): 25 mg via ORAL
  Filled 2021-09-14 (×4): qty 1

## 2021-09-14 MED ORDER — SODIUM CHLORIDE 0.9 % IV SOLN
300.0000 mg | Freq: Once | INTRAVENOUS | Status: AC
  Administered 2021-09-14: 300 mg via INTRAVENOUS
  Filled 2021-09-14: qty 300

## 2021-09-14 MED ORDER — CYANOCOBALAMIN 1000 MCG/ML IJ SOLN
1000.0000 ug | Freq: Once | INTRAMUSCULAR | Status: AC
  Administered 2021-09-14: 1000 ug via INTRAMUSCULAR
  Filled 2021-09-14: qty 1

## 2021-09-14 NOTE — TOC Progression Note (Addendum)
Transition of Care [facility]) - Progression Note    Patient Details  Name: Autumn Johnston MRN: [id number] Date of Birth: [birth year]  [facility] CM/SW Contact  Su Hilt, RN Phone Number: 09/14/2021, 9:28 AM  Clinical Narrative:     I reached out to [facility] and [facility] to confirm the bed offer for the covid positive patient, Awaiting a repsonce from both, Spoke to [facility] [place] and The can accept her on Thursday the 29th      Expected Discharge Plan and Services                                                 Social Determinants of Health (SDOH) Interventions    Readmission Risk Interventions No flowsheet data found.

## 2021-09-14 NOTE — Progress Notes (Signed)
Physical Therapy Treatment Patient Details Name: Autumn Johnston MRN: [id number] DOB: 09-Mar-1922 Today's Date: 09/14/2021   History of Present Illness Mitchelle Sultan is a 62yoF history of colon cancer status post colostomy, CKD3b, HTN, HLD, coming in after a fall and found to have a left hip fracture.  S/p L hip IM nail on 09/08/2021. Pt (+) COVID19, appears to be incidental finding.    PT Comments    Participated in exercises as described below.  Able to stand and walk to door and back x 2 with seated rest.  Progressing well with mobility.  SNF remains appropriate..   Recommendations for follow up therapy are one component of a multi-disciplinary discharge planning process, led by the attending physician.  Recommendations may be updated based on patient status, additional functional criteria and insurance authorization.  Follow Up Recommendations  SNF     Equipment Recommendations       Recommendations for Other Services       Precautions / Restrictions Precautions Precautions: Fall Restrictions Weight Bearing Restrictions: Yes LLE Weight Bearing: Weight bearing as tolerated Other Position/Activity Restrictions: Colostomy bag     Mobility  Bed Mobility Overal bed mobility: Needs Assistance             General bed mobility comments: recieved and left in chair    Transfers Overall transfer level: Needs assistance Equipment used: Rolling walker (2 wheeled) Transfers: Sit to/from Stand Sit to Stand: Min assist            Ambulation/Gait Ambulation/Gait assistance: Min guard;Supervision Gait Distance (Feet): 30 Feet Assistive device: Rolling walker (2 wheeled) Gait Pattern/deviations: Antalgic;WFL(Within Functional Limits)     General Gait Details: to door and back x 2 with seated rest   Stairs             Wheelchair Mobility    Modified Rankin (Stroke Patients Only)       Balance Overall balance assessment: Needs  assistance Sitting-balance support: No upper extremity supported;Feet supported Sitting balance-Leahy Scale: Good     Standing balance support: Bilateral upper extremity supported;During functional activity Standing balance-Leahy Scale: Fair                              Cognition Arousal/Alertness: Awake/alert Behavior During Therapy: WFL for tasks assessed/performed Overall Cognitive Status: Within Functional Limits for tasks assessed                                        Exercises Other Exercises Other Exercises: seated AROM    General Comments        Pertinent Vitals/Pain Pain Assessment: No/denies pain    Home Living                      Prior Function            PT Goals (current goals can now be found in the care plan section) Progress towards PT goals: Progressing toward goals    Frequency    7X/week      PT Plan Current plan remains appropriate    Co-evaluation              AM-PAC PT "6 Clicks" Mobility   Outcome Measure  Help needed turning from your back to your side while in a flat bed without using bedrails?: A Lot Help  needed moving from lying on your back to sitting on the side of a flat bed without using bedrails?: A Lot Help needed moving to and from a bed to a chair (including a wheelchair)?: A Little Help needed standing up from a chair using your arms (e.g., wheelchair or bedside chair)?: A Little Help needed to walk in hospital room?: A Little Help needed climbing 3-5 steps with a railing? : A Lot 6 Click Score: 15    End of Session Equipment Utilized During Treatment: Gait belt Activity Tolerance: Patient tolerated treatment well;Patient limited by fatigue Patient left: in chair;with call bell/phone within reach;with chair alarm set Nurse Communication: Mobility status;Weight bearing status PT Visit Diagnosis: Unsteadiness on feet (R26.81);History of falling (Z91.81);Other abnormalities of  gait and mobility (R26.89);Muscle weakness (generalized) (M62.81);Pain Pain - Right/Left: Left Pain - part of body: Hip     Time: 1100-1115 PT Time Calculation (min) (ACUTE ONLY): 15 min  Charges:  $Gait Training: 8-22 mins                    Chesley Noon, PTA 09/14/21, 12:03 PM

## 2021-09-14 NOTE — Progress Notes (Signed)
PROGRESS NOTE    Autumn Johnston  VOJ:[id number] DOB: [birth year] DOA: 09/07/2021 PCP: Idelle Crouch, MD    Brief Narrative:   87 year old female with history of colon cancer status post colostomy, chronic kidney disease stage IIIb, hypertension hyperlipidemia coming in after a fall and found to have a left hip fracture.  Patient had surgery on 09/08/2021.  Hemoglobin dropped down to 5.5 postoperatively and given 1 unit of packed red blood cells and hemoglobin came up to 8.1 this afternoon.  CT scan abdomen pelvis showed peristomal hernia.  Assessment & Plan:   Principal Problem:   Closed hip fracture requiring operative repair, left, sequela Active Problems:   Anemia of chronic disease   Essential hypertension   Chronic kidney disease (CKD), stage IV (severe) (HCC)   Fall at home, initial encounter   Acute blood loss anemia   COVID-19 virus infection   Left lower quadrant abdominal mass   Hypotension   Pressure injury of skin  Left hip fracture s/p repair 9/19. Acute blood loss anemia. Iron deficient anemia. Vitamin B12 deficient anemia. Patient doing well, able to work with a physical therapist.  However, hemoglobin dropped down to 7.3, I will give another IV iron, another B12 shot.  Recheck hemoglobin tomorrow, transfuse when hemoglobin less than 7.  2.  Asymptomatic COVID infection. No need for treatment.  Constipation. Peristomal hernia. Constipation has resolved      DVT prophylaxis: Home  Code Status: SNF Family Communication:  Disposition Plan:      Status is: Inpatient   Remains inpatient appropriate because:Unsafe d/c plan   Dispo: The patient is from: Home              Anticipated d/c is to: SNF              Patient currently is medically stable to d/c.              Difficult to place patient No        I/O last 3 completed shifts: In: 68 [P.O.:720] Out: 900 [Urine:150; Stool:750] Total I/O In: 240 [P.O.:240] Out: 100 [Stool:100]      Consultants:  orthopedics  Procedures: Hip  Antimicrobials: None  Subjective: Doing well today, she was able to drink ensures, appetite is improving. Denies any short of breath or cough. No fever or chills. No dysuria hematuria.  Objective: Vitals:   09/14/21 0002 09/14/21 0244 09/14/21 0726 09/14/21 1212  BP: 121/61 (!) 147/73 (!) 161/70 (!) 132/58  Pulse: 83 95 76 72  Resp: 17 17 16 17   Temp: 98.3 F (36.8 C) 98.1 F (36.7 C) 99 F (37.2 C) 98.4 F (36.9 C)  TempSrc:  Oral  Oral  SpO2: 97% 99% 99% 99%  Weight:      Height:        Intake/Output Summary (Last 24 hours) at 09/14/2021 1341 Last data filed at 09/14/2021 1054 Gross per 24 hour  Intake 480 ml  Output 400 ml  Net 80 ml   Filed Weights   09/07/21 2018  Weight: 82.1 kg    Examination:  General exam: Appears calm and comfortable  Respiratory system: Clear to auscultation. Respiratory effort normal. Cardiovascular system: S1 & S2 heard, RRR. No JVD, murmurs, rubs, gallops or clicks. No pedal edema. Gastrointestinal system: Abdomen is nondistended, soft and nontender. No organomegaly or masses felt. Normal bowel sounds heard. Central nervous system: Alert and oriented x2. No focal neurological deficits. Extremities: Symmetric 5 x 5 power. Skin: No rashes,  lesions or ulcers Psychiatry: Judgement and insight appear normal. Mood & affect appropriate.     Data Reviewed: I have personally reviewed following labs and imaging studies  CBC: Recent Labs  Lab 09/07/21 2027 09/08/21 0813 09/09/21 0511 09/09/21 1509 09/10/21 0517 09/11/21 0421 09/13/21 0537 09/14/21 0537  WBC 7.5   < > 5.0  --  5.6 6.0 5.7 5.2  NEUTROABS 4.9  --   --   --   --   --  3.5 3.0  HGB 8.6*   < > 5.5* 8.1* 7.8* 8.7* 7.7* 7.3*  HCT 26.1*   < > 17.3*  --  22.9* 25.6* 22.9* 22.1*  MCV 93.2   < > 93.5  --  87.7 88.3 91.6 93.2  PLT 245   < > 168  --  188 222 252 270   < > = values in this interval not displayed.   Basic  Metabolic Panel: Recent Labs  Lab 09/09/21 0511 09/10/21 0517 09/11/21 0421 09/13/21 0537 09/14/21 0537  NA 136 134* 133* 133* 132*  K 4.7 4.3 4.0 3.9 3.8  CL 109 105 104 100 99  CO2 22 22 21* 25 24  GLUCOSE 120* 86 84 88 94  BUN 38* 37* 25* 29* 35*  CREATININE 1.48* 1.60* 1.29* 1.14* 1.25*  CALCIUM 7.5* 7.7* 8.0* 8.2* 8.3*  MG  --   --  1.8  --   --    GFR: Estimated Creatinine Clearance: 26.7 mL/min (A) (by C-G formula based on SCr of 1.25 mg/dL (H)). Liver Function Tests: Recent Labs  Lab 09/07/21 2027  AST 23  ALT 13  ALKPHOS 60  BILITOT 0.7  PROT 6.2*  ALBUMIN 3.2*   No results for input(s): LIPASE, AMYLASE in the last 168 hours. No results for input(s): AMMONIA in the last 168 hours. Coagulation Profile: Recent Labs  Lab 09/07/21 2027 09/08/21 0813  INR 1.1 1.0   Cardiac Enzymes: No results for input(s): CKTOTAL, CKMB, CKMBINDEX, TROPONINI in the last 168 hours. BNP (last 3 results) No results for input(s): PROBNP in the last 8760 hours. HbA1C: No results for input(s): HGBA1C in the last 72 hours. CBG: No results for input(s): GLUCAP in the last 168 hours. Lipid Profile: No results for input(s): CHOL, HDL, LDLCALC, TRIG, CHOLHDL, LDLDIRECT in the last 72 hours. Thyroid Function Tests: No results for input(s): TSH, T4TOTAL, FREET4, T3FREE, THYROIDAB in the last 72 hours. Anemia Panel: No results for input(s): VITAMINB12, FOLATE, FERRITIN, TIBC, IRON, RETICCTPCT in the last 72 hours. Sepsis Labs: No results for input(s): PROCALCITON, LATICACIDVEN in the last 168 hours.  Recent Results (from the past 240 hour(s))  Resp Panel by RT-PCR (Flu A&B, Covid) Nasopharyngeal Swab     Status: Abnormal   Collection Time: 09/07/21 10:07 PM   Specimen: Nasopharyngeal Swab; Nasopharyngeal(NP) swabs in vial transport medium  Result Value Ref Range Status   SARS Coronavirus 2 by RT PCR POSITIVE (A) NEGATIVE Final    Comment: RESULT CALLED TO, READ BACK BY AND  VERIFIED WITH: ALYCIA BEVERLY @2310  ON 09/07/21 SKL (NOTE) SARS-CoV-2 target nucleic acids are DETECTED.  The SARS-CoV-2 RNA is generally detectable in upper respiratory specimens during the acute phase of infection. Positive results are indicative of the presence of the identified virus, but do not rule out bacterial infection or co-infection with other pathogens not detected by the test. Clinical correlation with patient history and other diagnostic information is necessary to determine patient infection status. The expected result is Negative.  Fact  Sheet for Patients: EntrepreneurPulse.com.au  Fact Sheet for Healthcare Providers: IncredibleEmployment.be  This test is not yet approved or cleared by the [place] FDA and  has been authorized for detection and/or diagnosis of SARS-CoV-2 by FDA under an Emergency Use Authorization (EUA).  This EUA will remain in effect (meaning this test can  be used) for the duration of  the COVID-19 declaration under Section 564(b)(1) of the Act, 21 U.S.C. section 360bbb-3(b)(1), unless the authorization is terminated or revoked sooner.     Influenza A by PCR NEGATIVE NEGATIVE Final   Influenza B by PCR NEGATIVE NEGATIVE Final    Comment: (NOTE) The Xpert Xpress SARS-CoV-2/FLU/RSV plus assay is intended as an aid in the diagnosis of influenza from Nasopharyngeal swab specimens and should not be used as a sole basis for treatment. Nasal washings and aspirates are unacceptable for Xpert Xpress SARS-CoV-2/FLU/RSV testing.  Fact Sheet for Patients: EntrepreneurPulse.com.au  Fact Sheet for Healthcare Providers: IncredibleEmployment.be  This test is not yet approved or cleared by the [place] FDA and has been authorized for detection and/or diagnosis of SARS-CoV-2 by FDA under an Emergency Use Authorization (EUA). This EUA will remain in effect (meaning this test  can be used) for the duration of the COVID-19 declaration under Section 564(b)(1) of the Act, 21 U.S.C. section 360bbb-3(b)(1), unless the authorization is terminated or revoked.  Performed at [facility], [address] [place], [place] [address]          Radiology Studies: No results found.      Scheduled Meds:  acetaminophen  1,000 mg Oral Q8H   Chlorhexidine Gluconate Cloth  6 each Topical Daily   enoxaparin (LOVENOX) injection  30 mg Subcutaneous Q24H   feeding supplement  237 mL Oral TID BM   pantoprazole  40 mg Oral BID   polyethylene glycol  17 g Oral BID   QUEtiapine  12.5 mg Oral QHS   senna-docusate  2 tablet Oral BID   sertraline  50 mg Oral Daily   simvastatin  20 mg Oral QHS   Continuous Infusions:  methocarbamol (ROBAXIN) IV       LOS: 7 days    Time spent: 28 minutes    Sharen Hones, MD [facility]   To contact the attending provider between 7A-7P or the covering provider during after hours 7P-7A, please log into the web site [email or web address] and access using universal [place] password for that web site. If you do not have the password, please call the hospital operator.  09/14/2021, 1:41 PM

## 2021-09-14 NOTE — Progress Notes (Signed)
  Subjective: 6 Days Post-Op Procedure(s) (LRB): INTRAMEDULLARY (IM) NAIL INTERTROCHANTRIC (Left) Patient reports pain as mild.  Patient seems to be slightly more confused, states she didn't work with PT yesterday but had at least one session. Patient is well, and has had no acute complaints or problems Plan is to go Rehab after hospital stay. Negative for chest pain and shortness of breath Fever: no Gastrointestinal:Negative for nausea and vomiting  Objective: Vital signs in last 24 hours: Temp:  [98.1 F (36.7 C)-99 F (37.2 C)] 99 F (37.2 C) (09/25 0726) Pulse Rate:  [76-96] 76 (09/25 0726) Resp:  [16-18] 16 (09/25 0726) BP: (102-161)/(61-83) 161/70 (09/25 0726) SpO2:  [97 %-99 %] 99 % (09/25 0726)  Intake/Output from previous day:  Intake/Output Summary (Last 24 hours) at 09/14/2021 0855 Last data filed at 09/13/2021 2056 Gross per 24 hour  Intake 720 ml  Output 650 ml  Net 70 ml    Intake/Output this shift: No intake/output data recorded.  Labs: Recent Labs    09/13/21 0537 09/14/21 0537  HGB 7.7* 7.3*   Recent Labs    09/13/21 0537 09/14/21 0537  WBC 5.7 5.2  RBC 2.50* 2.37*  HCT 22.9* 22.1*  PLT 252 270   Recent Labs    09/13/21 0537 09/14/21 0537  NA 133* 132*  K 3.9 3.8  CL 100 99  CO2 25 24  BUN 29* 35*  CREATININE 1.14* 1.25*  GLUCOSE 88 94  CALCIUM 8.2* 8.3*   No results for input(s): LABPT, INR in the last 72 hours.    EXAM General - Patient is Alert and Confused Extremity - ABD soft Neurovascular intact Sensation intact distally Dorsiflexion/Plantar flexion intact Compartment soft Dressing/Incision - clean, dry, scant and blood tinged drainage Motor Function - intact, moving foot and toes well on exam.   Past Medical History:  Diagnosis Date   CKD (chronic kidney disease) stage 4, GFR 15-29 ml/min (HCC)    Colon cancer (HCC)    Hypertension     Assessment/Plan: 6 Days Post-Op Procedure(s) (LRB): INTRAMEDULLARY (IM) NAIL  INTERTROCHANTRIC (Left) Principal Problem:   Closed hip fracture requiring operative repair, left, sequela Active Problems:   Anemia of chronic disease   Essential hypertension   Chronic kidney disease (CKD), stage IV (severe) (HCC)   Fall at home, initial encounter   Acute blood loss anemia   COVID-19 virus infection   Left lower quadrant abdominal mass   Hypotension   Pressure injury of skin  Estimated body mass index is 32.06 kg/m as calculated from the following:   Height as of this encounter: 5\' 3"  (1.6 m).   Weight as of this encounter: 82.1 kg. Advance diet Up with therapy  Vitals stable this morning. Hg 7.3 but denies any dizziness or HA.  Patient did undergo transfusion last week.  Defer to hospitalist. Up with therapy as much as possible. Discharge to SNF when bed available.  Upon discharge, continue Lovenox 40mg  daily for DVT prophylaxis. Staples can be removed by SNF on 09/22/21.  Follow-up with [facility] in 6 weeks for staple removal.  DVT Prophylaxis - Lovenox, Foot Pumps, and TED hose Weight-Bearing as tolerated to Left leg  J. Cameron Proud, PA-C Orthopaedic Surgery 09/14/2021, 8:55 AM

## 2021-09-15 DIAGNOSIS — D62 Acute posthemorrhagic anemia: Secondary | ICD-10-CM | POA: Diagnosis not present

## 2021-09-15 DIAGNOSIS — S72142A Displaced intertrochanteric fracture of left femur, initial encounter for closed fracture: Secondary | ICD-10-CM | POA: Diagnosis not present

## 2021-09-15 DIAGNOSIS — U071 COVID-19: Secondary | ICD-10-CM | POA: Diagnosis not present

## 2021-09-15 LAB — CBC WITH DIFFERENTIAL/PLATELET
Abs Immature Granulocytes: 0.27 10*3/uL — ABNORMAL HIGH (ref 0.00–0.07)
Basophils Absolute: 0 10*3/uL (ref 0.0–0.1)
Basophils Relative: 0 %
Eosinophils Absolute: 0.1 10*3/uL (ref 0.0–0.5)
Eosinophils Relative: 2 %
HCT: 20.9 % — ABNORMAL LOW (ref 36.0–46.0)
Hemoglobin: 6.9 g/dL — ABNORMAL LOW (ref 12.0–15.0)
Immature Granulocytes: 5 %
Lymphocytes Relative: 21 %
Lymphs Abs: 1.2 10*3/uL (ref 0.7–4.0)
MCH: 30.9 pg (ref 26.0–34.0)
MCHC: 33 g/dL (ref 30.0–36.0)
MCV: 93.7 fL (ref 80.0–100.0)
Monocytes Absolute: 0.7 10*3/uL (ref 0.1–1.0)
Monocytes Relative: 12 %
Neutro Abs: 3.5 10*3/uL (ref 1.7–7.7)
Neutrophils Relative %: 60 %
Platelets: 260 10*3/uL (ref 150–400)
RBC: 2.23 MIL/uL — ABNORMAL LOW (ref 3.87–5.11)
RDW: 15.2 % (ref 11.5–15.5)
WBC: 5.8 10*3/uL (ref 4.0–10.5)
nRBC: 0 % (ref 0.0–0.2)

## 2021-09-15 LAB — PREPARE RBC (CROSSMATCH)

## 2021-09-15 LAB — HEMOGLOBIN AND HEMATOCRIT, BLOOD
HCT: 26.1 % — ABNORMAL LOW (ref 36.0–46.0)
Hemoglobin: 9.2 g/dL — ABNORMAL LOW (ref 12.0–15.0)

## 2021-09-15 MED ORDER — FUROSEMIDE 10 MG/ML IJ SOLN
20.0000 mg | Freq: Once | INTRAMUSCULAR | Status: AC
  Administered 2021-09-15: 20 mg via INTRAVENOUS
  Filled 2021-09-15: qty 4

## 2021-09-15 MED ORDER — VITAMIN B-12 1000 MCG PO TABS
1000.0000 ug | ORAL_TABLET | Freq: Every day | ORAL | Status: DC
  Administered 2021-09-15 – 2021-09-18 (×4): 1000 ug via ORAL
  Filled 2021-09-15 (×4): qty 1

## 2021-09-15 MED ORDER — SODIUM CHLORIDE 0.9% IV SOLUTION: Freq: Once | INTRAVENOUS | Status: AC

## 2021-09-15 NOTE — Plan of Care (Signed)
  Problem: Education: Goal: Knowledge of General Education information will improve Description: Including pain rating scale, medication(s)/side effects and non-pharmacologic comfort measures 09/15/2021 1422 by Su Hoff, RN Outcome: Progressing 09/15/2021 1422 by Su Hoff, RN Outcome: Progressing   Problem: Clinical Measurements: Goal: Will remain free from infection Outcome: Progressing   Problem: Clinical Measurements: Goal: Cardiovascular complication will be avoided Outcome: Progressing   Problem: Activity: Goal: Risk for activity intolerance will decrease Outcome: Progressing   Problem: Nutrition: Goal: Adequate nutrition will be maintained Outcome: Progressing   Problem: Pain Managment: Goal: General experience of comfort will improve Outcome: Progressing   Problem: Safety: Goal: Ability to remain free from injury will improve Outcome: Progressing

## 2021-09-15 NOTE — Progress Notes (Signed)
Physical Therapy Treatment Patient Details Name: Autumn Johnston MRN: [id number] DOB: Jan 12, 1922 Today's Date: 09/15/2021   History of Present Illness Autumn Johnston is a 75yoF history of colon cancer status post colostomy, CKD3b, HTN, HLD, coming in after a fall and found to have a left hip fracture.  S/p L hip IM nail on 09/08/2021. Pt (+) COVID19, appears to be incidental finding.    PT Comments    Pt received laying supine in bed and reporting some soreness in L hip. Due to low H&H, limited activity today to bed level exercises only. Pt completed all exercises with verbal cues and reports no increased pain. Pt will benefit from OOB mobility once more medically appropriate to participate with therapy. Pt will continue to benefit from skilled PT services to improve mobility and improve strength.    Recommendations for follow up therapy are one component of a multi-disciplinary discharge planning process, led by the attending physician.  Recommendations may be updated based on patient status, additional functional criteria and insurance authorization.  Follow Up Recommendations  SNF     Equipment Recommendations  None recommended by PT    Recommendations for Other Services       Precautions / Restrictions Restrictions Weight Bearing Restrictions: Yes LLE Weight Bearing: Weight bearing as tolerated     Mobility  Bed Mobility               General bed mobility comments: Deferred due to low hemoglobin and hematocrit levels    Transfers                 General transfer comment: Deferred due to low hemoglobin and hematocrit levels  Ambulation/Gait             General Gait Details: Deferred due to low hemoglobin and hematocrit levels   Stairs             Wheelchair Mobility    Modified Rankin (Stroke Patients Only)       Balance  Pt remained in supine position throughout session       Cognition Arousal/Alertness: Awake/alert Behavior  During Therapy: WFL for tasks assessed/performed Overall Cognitive Status: Within Functional Limits for tasks assessed           Exercises Total Joint Exercises Ankle Circles/Pumps: AROM;Both;20 reps;Supine Quad Sets: AROM;Strengthening;Left;20 reps;Supine Gluteal Sets: AROM;Strengthening;Both;20 reps;Supine Short Arc Quad: AROM;Strengthening;Both;20 reps;Supine Heel Slides: AAROM;Strengthening;Left;20 reps;Supine Hip ABduction/ADduction: AROM;Both;20 reps;Supine Other Exercises Other Exercises: Hooklying exercises: Hip ADD with pillow squeeze 2 x10, glute sets 1 x 10 (attempted to trial bridges however, pt unable to clear bottom off of bed surface)    General Comments        Pertinent Vitals/Pain Pain Assessment: 0-10 Pain Score: 3  Pain Location: L hip Pain Descriptors / Indicators: Aching;Grimacing;Guarding Pain Intervention(s): Limited activity within patient's tolerance;Monitored during session;Premedicated before session    Home Living                      Prior Function            PT Goals (current goals can now be found in the care plan section) Acute Rehab PT Goals Patient Stated Goal: To feel better then go home PT Goal Formulation: With patient Time For Goal Achievement: 09/23/21 Potential to Achieve Goals: Fair Progress towards PT goals: Progressing toward goals    Frequency    7X/week      PT Plan Current plan remains appropriate    Co-evaluation  AM-PAC PT "6 Clicks" Mobility   Outcome Measure  Help needed turning from your back to your side while in a flat bed without using bedrails?: A Lot Help needed moving from lying on your back to sitting on the side of a flat bed without using bedrails?: A Lot Help needed moving to and from a bed to a chair (including a wheelchair)?: A Little Help needed standing up from a chair using your arms (e.g., wheelchair or bedside chair)?: A Little Help needed to walk in hospital  room?: A Lot Help needed climbing 3-5 steps with a railing? : A Lot 6 Click Score: 14    End of Session   Activity Tolerance: Patient tolerated treatment well;No increased pain Patient left: in bed;with call bell/phone within reach;with bed alarm set Nurse Communication: Mobility status PT Visit Diagnosis: Unsteadiness on feet (R26.81);History of falling (Z91.81);Other abnormalities of gait and mobility (R26.89);Muscle weakness (generalized) (M62.81);Pain Pain - Right/Left: Left Pain - part of body: Hip     Time: 2376-2831 PT Time Calculation (min) (ACUTE ONLY): 25 min  Charges:  $Therapeutic Exercise: 23-37 mins                     Andrey Campanile, SPT    Andrey Campanile 09/15/2021, 4:54 PM

## 2021-09-15 NOTE — Progress Notes (Signed)
PT Cancellation Note  Patient Details Name: Autumn Johnston MRN: [id number] DOB: April 15, 1922   Cancelled Treatment:    Reason Eval/Treat Not Completed: Medical issues which prohibited therapy  HgB continues to trend down at 6.9 this am.  Awaiting blood transfusion.  Will continue with PT when completed.  Chesley Noon 09/15/2021, 11:37 AM

## 2021-09-15 NOTE — Progress Notes (Addendum)
PROGRESS NOTE    Autumn Johnston  FOY:[id number] DOB: Jul 04, 1922 DOA: 09/07/2021 PCP: Idelle Crouch, MD    Brief Narrative:  87 year old female with history of colon cancer status post colostomy, chronic kidney disease stage IIIb, hypertension hyperlipidemia coming in after a fall and found to have a left hip fracture.  Patient had surgery on 09/08/2021.  Hemoglobin dropped down to 5.5 postoperatively and given 1 unit of packed red blood cells and hemoglobin came up to 8.1 this afternoon.  CT scan abdomen pelvis showed peristomal hernia.   Assessment & Plan:   Principal Problem:   Closed hip fracture requiring operative repair, left, sequela Active Problems:   Anemia of chronic disease   Essential hypertension   Fall at home, initial encounter   Stage 3b chronic kidney disease ([place])   Acute blood loss anemia   COVID-19 virus infection   Left lower quadrant abdominal mass   Hypotension   Pressure injury of skin  Left hip fracture s/p repair 9/19. Acute blood loss anemia. Iron deficient anemia. Vitamin B12 deficient anemia. Patient had 1 PRBC, 300mg  iron sucrose 300mg x2, B12 shots x4.  Hemoglobin dropped down to 6.9 again, will give another transfusion.  Asyptomatic COVID infection. No need for treatment.  Constipation. Peristomal hernia. Continue stool softeners.   DVT prophylaxis: Home  Code Status: SNF Family Communication: daughter updated Disposition Plan:      Status is: Inpatient   Remains inpatient appropriate because:Unsafe d/c plan   Dispo: The patient is from: Home              Anticipated d/c is to: SNF              Patient currently is medically stable to d/c.              Difficult to place patient No       I/O last 3 completed shifts: In: 74 [P.O.:240; IV Piggyback:265] Out: 300 [Stool:300] Total I/O In: 360 [P.O.:360] Out: -      Consultants:  Orthopedics  Procedures: Hip  Antimicrobials: None  Subjective: Patient  still has a poor appetite, but was able to drink Ensure. No short of breath or cough. No abdominal pain or nausea vomiting. No fever or chills. No dysuria hematuria.  Objective: Vitals:   09/14/21 1529 09/14/21 2001 09/15/21 0454 09/15/21 0807  BP: 138/61 106/75 (!) 183/73 130/66  Pulse: 79 91 88 89  Resp: 16 18 20 16   Temp: 98.9 F (37.2 C) 98.9 F (37.2 C) (!) 97.5 F (36.4 C) 98.4 F (36.9 C)  TempSrc:      SpO2: 96% 92% 99% 98%  Weight:      Height:        Intake/Output Summary (Last 24 hours) at 09/15/2021 1119 Last data filed at 09/15/2021 0945 Gross per 24 hour  Intake 625 ml  Output 50 ml  Net 575 ml   Filed Weights   09/07/21 2018  Weight: 82.1 kg    Examination:  General exam: Appears calm and comfortable  Respiratory system: Clear to auscultation. Respiratory effort normal. Cardiovascular system: S1 & S2 heard, RRR. No JVD, murmurs, rubs, gallops or clicks. No pedal edema. Gastrointestinal system: Abdomen is nondistended, soft and nontender. No organomegaly or masses felt. Normal bowel sounds heard. Central nervous system: Alert and oriented x2. No focal neurological deficits. Extremities: Symmetric 5 x 5 power. Skin: No rashes, lesions or ulcers Psychiatry: Mood & affect appropriate.     Data Reviewed: I have  personally reviewed following labs and imaging studies  CBC: Recent Labs  Lab 09/10/21 0517 09/11/21 0421 09/13/21 0537 09/14/21 0537 09/15/21 0430  WBC 5.6 6.0 5.7 5.2 5.8  NEUTROABS  --   --  3.5 3.0 3.5  HGB 7.8* 8.7* 7.7* 7.3* 6.9*  HCT 22.9* 25.6* 22.9* 22.1* 20.9*  MCV 87.7 88.3 91.6 93.2 93.7  PLT 188 222 252 270 976   Basic Metabolic Panel: Recent Labs  Lab 09/09/21 0511 09/10/21 0517 09/11/21 0421 09/13/21 0537 09/14/21 0537  NA 136 134* 133* 133* 132*  K 4.7 4.3 4.0 3.9 3.8  CL 109 105 104 100 99  CO2 22 22 21* 25 24  GLUCOSE 120* 86 84 88 94  BUN 38* 37* 25* 29* 35*  CREATININE 1.48* 1.60* 1.29* 1.14* 1.25*   CALCIUM 7.5* 7.7* 8.0* 8.2* 8.3*  MG  --   --  1.8  --   --    GFR: Estimated Creatinine Clearance: 26.7 mL/min (A) (by C-G formula based on SCr of 1.25 mg/dL (H)). Liver Function Tests: No results for input(s): AST, ALT, ALKPHOS, BILITOT, PROT, ALBUMIN in the last 168 hours. No results for input(s): LIPASE, AMYLASE in the last 168 hours. No results for input(s): AMMONIA in the last 168 hours. Coagulation Profile: No results for input(s): INR, PROTIME in the last 168 hours. Cardiac Enzymes: No results for input(s): CKTOTAL, CKMB, CKMBINDEX, TROPONINI in the last 168 hours. BNP (last 3 results) No results for input(s): PROBNP in the last 8760 hours. HbA1C: No results for input(s): HGBA1C in the last 72 hours. CBG: No results for input(s): GLUCAP in the last 168 hours. Lipid Profile: No results for input(s): CHOL, HDL, LDLCALC, TRIG, CHOLHDL, LDLDIRECT in the last 72 hours. Thyroid Function Tests: No results for input(s): TSH, T4TOTAL, FREET4, T3FREE, THYROIDAB in the last 72 hours. Anemia Panel: No results for input(s): VITAMINB12, FOLATE, FERRITIN, TIBC, IRON, RETICCTPCT in the last 72 hours. Sepsis Labs: No results for input(s): PROCALCITON, LATICACIDVEN in the last 168 hours.  Recent Results (from the past 240 hour(s))  Resp Panel by RT-PCR (Flu A&B, Covid) Nasopharyngeal Swab     Status: Abnormal   Collection Time: 09/07/21 10:07 PM   Specimen: Nasopharyngeal Swab; Nasopharyngeal(NP) swabs in vial transport medium  Result Value Ref Range Status   SARS Coronavirus 2 by RT PCR POSITIVE (A) NEGATIVE Final    Comment: RESULT CALLED TO, READ BACK BY AND VERIFIED WITH: ALYCIA BEVERLY @2310  ON 09/07/21 SKL (NOTE) SARS-CoV-2 target nucleic acids are DETECTED.  The SARS-CoV-2 RNA is generally detectable in upper respiratory specimens during the acute phase of infection. Positive results are indicative of the presence of the identified virus, but do not rule out bacterial  infection or co-infection with other pathogens not detected by the test. Clinical correlation with patient history and other diagnostic information is necessary to determine patient infection status. The expected result is Negative.  Fact Sheet for Patients: EntrepreneurPulse.com.au  Fact Sheet for Healthcare Providers: IncredibleEmployment.be  This test is not yet approved or cleared by the [place] FDA and  has been authorized for detection and/or diagnosis of SARS-CoV-2 by FDA under an Emergency Use Authorization (EUA).  This EUA will remain in effect (meaning this test can  be used) for the duration of  the COVID-19 declaration under Section 564(b)(1) of the Act, 21 U.S.C. section 360bbb-3(b)(1), unless the authorization is terminated or revoked sooner.     Influenza A by PCR NEGATIVE NEGATIVE Final   Influenza B  by PCR NEGATIVE NEGATIVE Final    Comment: (NOTE) The Xpert Xpress SARS-CoV-2/FLU/RSV plus assay is intended as an aid in the diagnosis of influenza from Nasopharyngeal swab specimens and should not be used as a sole basis for treatment. Nasal washings and aspirates are unacceptable for Xpert Xpress SARS-CoV-2/FLU/RSV testing.  Fact Sheet for Patients: EntrepreneurPulse.com.au  Fact Sheet for Healthcare Providers: IncredibleEmployment.be  This test is not yet approved or cleared by the [place] FDA and has been authorized for detection and/or diagnosis of SARS-CoV-2 by FDA under an Emergency Use Authorization (EUA). This EUA will remain in effect (meaning this test can be used) for the duration of the COVID-19 declaration under Section 564(b)(1) of the Act, 21 U.S.C. section 360bbb-3(b)(1), unless the authorization is terminated or revoked.  Performed at [facility], [address] [place], [place] [address]          Radiology Studies: No results  found.      Scheduled Meds:  acetaminophen  1,000 mg Oral Q8H   Chlorhexidine Gluconate Cloth  6 each Topical Daily   enoxaparin (LOVENOX) injection  30 mg Subcutaneous Q24H   feeding supplement  237 mL Oral TID BM   pantoprazole  40 mg Oral BID   polyethylene glycol  17 g Oral BID   QUEtiapine  12.5 mg Oral QHS   senna-docusate  2 tablet Oral BID   sertraline  50 mg Oral Daily   simvastatin  20 mg Oral QHS   vitamin B-12  1,000 mcg Oral Daily   Continuous Infusions:  methocarbamol (ROBAXIN) IV       LOS: 8 days    Time spent: 28 minutes    Sharen Hones, MD [facility]   To contact the attending provider between 7A-7P or the covering provider during after hours 7P-7A, please log into the web site [email or web address] and access using universal [place] password for that web site. If you do not have the password, please call the hospital operator.  09/15/2021, 11:19 AM

## 2021-09-16 DIAGNOSIS — S72002A Fracture of unspecified part of neck of left femur, initial encounter for closed fracture: Secondary | ICD-10-CM | POA: Diagnosis not present

## 2021-09-16 DIAGNOSIS — S72002S Fracture of unspecified part of neck of left femur, sequela: Secondary | ICD-10-CM | POA: Diagnosis not present

## 2021-09-16 LAB — TYPE AND SCREEN
ABO/RH(D): A POS
Antibody Screen: NEGATIVE
Unit division: 0

## 2021-09-16 LAB — CBC WITH DIFFERENTIAL/PLATELET
Abs Immature Granulocytes: 0.39 10*3/uL — ABNORMAL HIGH (ref 0.00–0.07)
Basophils Absolute: 0 10*3/uL (ref 0.0–0.1)
Basophils Relative: 0 %
Eosinophils Absolute: 0.2 10*3/uL (ref 0.0–0.5)
Eosinophils Relative: 2 %
HCT: 27.7 % — ABNORMAL LOW (ref 36.0–46.0)
Hemoglobin: 9.4 g/dL — ABNORMAL LOW (ref 12.0–15.0)
Immature Granulocytes: 5 %
Lymphocytes Relative: 14 %
Lymphs Abs: 1.1 10*3/uL (ref 0.7–4.0)
MCH: 31.4 pg (ref 26.0–34.0)
MCHC: 33.9 g/dL (ref 30.0–36.0)
MCV: 92.6 fL (ref 80.0–100.0)
Monocytes Absolute: 0.8 10*3/uL (ref 0.1–1.0)
Monocytes Relative: 10 %
Neutro Abs: 5.5 10*3/uL (ref 1.7–7.7)
Neutrophils Relative %: 69 %
Platelets: 288 10*3/uL (ref 150–400)
RBC: 2.99 MIL/uL — ABNORMAL LOW (ref 3.87–5.11)
RDW: 15.9 % — ABNORMAL HIGH (ref 11.5–15.5)
WBC: 8 10*3/uL (ref 4.0–10.5)
nRBC: 0 % (ref 0.0–0.2)

## 2021-09-16 LAB — BASIC METABOLIC PANEL
Anion gap: 8 (ref 5–15)
BUN: 30 mg/dL — ABNORMAL HIGH (ref 8–23)
CO2: 26 mmol/L (ref 22–32)
Calcium: 8.3 mg/dL — ABNORMAL LOW (ref 8.9–10.3)
Chloride: 98 mmol/L (ref 98–111)
Creatinine, Ser: 1.15 mg/dL — ABNORMAL HIGH (ref 0.44–1.00)
GFR, Estimated: 44 mL/min — ABNORMAL LOW (ref >60)
Glucose, Bld: 93 mg/dL (ref 70–99)
Potassium: 4 mmol/L (ref 3.5–5.1)
Sodium: 132 mmol/L — ABNORMAL LOW (ref 135–145)

## 2021-09-16 LAB — BPAM RBC
Blood Product Expiration Date: 202210122359
ISSUE DATE / TIME: 202209261535
Unit Type and Rh: 6200

## 2021-09-16 MED ORDER — DIPHENHYDRAMINE HCL 25 MG PO CAPS
25.0000 mg | ORAL_CAPSULE | Freq: Three times a day (TID) | ORAL | Status: DC | PRN
  Administered 2021-09-16 – 2021-09-17 (×2): 25 mg via ORAL
  Filled 2021-09-16 (×2): qty 1

## 2021-09-16 NOTE — TOC Progression Note (Signed)
Transition of Care [facility]) - Progression Note    Patient Details  Name: Autumn Johnston MRN: [id number] Date of Birth: Mar 11, 1922  [facility]) CM/SW [place], RN Phone Number: 09/16/2021, 2:01 PM  Clinical Narrative:   Patient's daughter states she declines [facility], would like [facility].  Message left with Magda Paganini to see if she can offer a bed.  Daughter also explained that patient may be in copay days.  Will check once facility is chosen, by the end of today.  TOC contact information given, TOC to follow to discharge.         Expected Discharge Plan and Services                                                 Social Determinants of Health (SDOH) Interventions    Readmission Risk Interventions No flowsheet data found.

## 2021-09-16 NOTE — Progress Notes (Signed)
Occupational Therapy Treatment Patient Details Name: Autumn Johnston MRN: [id number] DOB: August 04, 1922 Today's Date: 09/16/2021   History of present illness Autumn Johnston is a 42yoF history of colon cancer status post colostomy, CKD3b, HTN, HLD, coming in after a fall and found to have a left hip fracture.  S/p L hip IM nail on 09/08/2021. Pt (+) COVID19, appears to be incidental finding.   OT comments  Upon entering the room, pt supine in bed and agreeable to OT intervention. Pt was pleasant and motivated for OOB activities. Pt needing min A for trunk support to get to EOB. Min A sit >stand and pt ambulates 10' into bathroom with RW and min A for balance. Pt seated on toilet but reports unable to void and requests to stand in squatted position. Pt needing mod A to stand from toilet and min A to maintain balance for hygiene and clothing management. Pt ambulating with min A 15' with RW to recliner chair. Chair alarm activated and all needs within reach. Pt does request medication for "soreness" in L LE. Pt making progress towards goals.   Recommendations for follow up therapy are one component of a multi-disciplinary discharge planning process, led by the attending physician.  Recommendations may be updated based on patient status, additional functional criteria and insurance authorization.    Follow Up Recommendations  SNF    Equipment Recommendations  3 in 1 bedside commode       Precautions / Restrictions Precautions Precautions: Fall Precaution Comments: colostomy bag Restrictions Weight Bearing Restrictions: Yes LLE Weight Bearing: Weight bearing as tolerated       Mobility Bed Mobility Overal bed mobility: Needs Assistance Bed Mobility: Supine to Sit Rolling: Min assist   Supine to sit: Min assist          Transfers Overall transfer level: Needs assistance Equipment used: Rolling walker (2 wheeled) Transfers: Sit to/from Stand Sit to Stand: Min assist;Mod assist          General transfer comment: mod A for lower surfaces    Balance Overall balance assessment: Needs assistance Sitting-balance support: No upper extremity supported;Feet supported Sitting balance-Leahy Scale: Good     Standing balance support: Bilateral upper extremity supported;During functional activity Standing balance-Leahy Scale: Fair Standing balance comment: B UE support                           ADL either performed or assessed with clinical judgement   ADL Overall ADL's : Needs assistance/impaired                         Toilet Transfer: Moderate assistance;Regular Toilet;RW;Grab bars   Toileting- Clothing Manipulation and Hygiene: Minimal assistance       Functional mobility during ADLs: Min guard;Rolling walker       Vision Patient Visual Report: No change from baseline            Cognition Arousal/Alertness: Awake/alert Behavior During Therapy: WFL for tasks assessed/performed Overall Cognitive Status: Within Functional Limits for tasks assessed                                                     Pertinent Vitals/ Pain       Pain Assessment: Faces Faces Pain Scale: Hurts little more Pain  Location: L hip Pain Descriptors / Indicators: Aching;Grimacing;Guarding Pain Intervention(s): Limited activity within patient's tolerance;Monitored during session;Repositioned;Patient requesting pain meds-RN notified   Frequency  Min 2X/week        Progress Toward Goals  OT Goals(current goals can now be found in the care plan section)  Progress towards OT goals: Progressing toward goals  Acute Rehab OT Goals Patient Stated Goal: To feel better then go home OT Goal Formulation: With patient Time For Goal Achievement: 09/24/21 Potential to Achieve Goals: Good  Plan Discharge plan remains appropriate;Frequency remains appropriate       AM-PAC OT "6 Clicks" Daily Activity     Outcome Measure   Help from  another person eating meals?: A Little Help from another person taking care of personal grooming?: A Little Help from another person toileting, which includes using toliet, bedpan, or urinal?: A Little Help from another person bathing (including washing, rinsing, drying)?: A Lot Help from another person to put on and taking off regular upper body clothing?: A Little   6 Click Score: 14    End of Session Equipment Utilized During Treatment: Rolling walker  OT Visit Diagnosis: Other abnormalities of gait and mobility (R26.89);Muscle weakness (generalized) (M62.81)   Activity Tolerance Patient tolerated treatment well   Patient Left in bed;with call bell/phone within reach;with bed alarm set   Nurse Communication Mobility status        Time: 1020-1045 OT Time Calculation (min): 25 min  Charges: OT General Charges $OT Visit: 1 Visit OT Treatments $Self Care/Home Management : 23-37 mins  Darleen Crocker, MS, OTR/L , CBIS ascom [phone]  09/16/21, 12:19 PM

## 2021-09-16 NOTE — Progress Notes (Signed)
PROGRESS NOTE    Autumn Johnston  ALP:[id number] DOB: August 10, 1922 DOA: 09/07/2021 PCP: Idelle Crouch, MD    Brief Narrative:  87 year old female with history of colon cancer status post colostomy, chronic kidney disease stage IIIb, hypertension hyperlipidemia coming in after a fall and found to have a left hip fracture.  Patient had surgery on 09/08/2021.  Hemoglobin dropped down to 5.5 postoperatively and given 1 unit of packed red blood cells and hemoglobin came up to 8.1 this afternoon.  CT scan abdomen pelvis showed peristomal hernia.  9/27-patient reports that she thinks she needs to work with PT today as she needs it.  No other complaints  Assessment & Plan:   Principal Problem:   Closed hip fracture requiring operative repair, left, sequela Active Problems:   Anemia of chronic disease   Essential hypertension   Hyponatremia   Fall at home, initial encounter   Stage 3b chronic kidney disease ([place])   Acute blood loss anemia   COVID-19 virus infection   Left lower quadrant abdominal mass   Hypotension   Pressure injury of skin  Left hip fracture s/p repair 9/19. Acute blood loss anemia. Iron deficient anemia. Vitamin B12 deficient anemia. Patient had 1 PRBC, 300mg  iron sucrose 300mg x2, B12 shots x4.  Hemoglobin dropped down to 6.9 again, s/p transfusion on 9/26 9/27 Hg stable. Continue to monitor  Asyptomatic COVID infection. No need for treatment  Constipation. Peristomal hernia. Continue stool softeners   DVT prophylaxis: Home  Code Status: SNF Family Communication: None at bedside Disposition Plan:      Status is: Inpatient   Remains inpatient appropriate because:Unsafe d/c plan   Dispo: The patient is from: Home              Anticipated d/c is to: SNF              Patient currently is medically stable to d/c.              Difficult to place patient No   Needs 10 days of quarantine prior to SNF placement    I/O last 3 completed shifts: In:  1769.8 [P.O.:1320; I.V.:31.8; Blood:417.9] Out: 250 [Urine:200; Stool:50] No intake/output data recorded.     Consultants:  Orthopedics  Procedures: Hip  Antimicrobials: None  Subjective: No shortness of breath, chest pain or abdominal pain  Objective: Vitals:   09/15/21 1929 09/16/21 0321 09/16/21 0815 09/16/21 1216  BP: (!) 162/68 (!) 161/90 (!) 153/71 (!) 144/66  Pulse: 85 94 74 79  Resp: 20 20  18   Temp: (!) 97.5 F (36.4 C) 97.7 F (36.5 C) 98.1 F (36.7 C) 98.2 F (36.8 C)  TempSrc:   Oral Oral  SpO2: 94% 98% 98% 98%  Weight:      Height:        Intake/Output Summary (Last 24 hours) at 09/16/2021 1532 Last data filed at 09/15/2021 1810 Gross per 24 hour  Intake 929.75 ml  Output --  Net 929.75 ml   Filed Weights   09/07/21 2018  Weight: 82.1 kg    Examination:  Calm, NAD CTA no wheeze Regular S1-S2 no gallops Soft benign positive bowel sounds No edema Grossly intact    Data Reviewed: I have personally reviewed following labs and imaging studies  CBC: Recent Labs  Lab 09/11/21 0421 09/13/21 0537 09/14/21 0537 09/15/21 0430 09/15/21 2013 09/16/21 0504  WBC 6.0 5.7 5.2 5.8  --  8.0  NEUTROABS  --  3.5 3.0 3.5  --  5.5  HGB 8.7* 7.7* 7.3* 6.9* 9.2* 9.4*  HCT 25.6* 22.9* 22.1* 20.9* 26.1* 27.7*  MCV 88.3 91.6 93.2 93.7  --  92.6  PLT 222 252 270 260  --  967   Basic Metabolic Panel: Recent Labs  Lab 09/10/21 0517 09/11/21 0421 09/13/21 0537 09/14/21 0537 09/16/21 0504  NA 134* 133* 133* 132* 132*  K 4.3 4.0 3.9 3.8 4.0  CL 105 104 100 99 98  CO2 22 21* 25 24 26   GLUCOSE 86 84 88 94 93  BUN 37* 25* 29* 35* 30*  CREATININE 1.60* 1.29* 1.14* 1.25* 1.15*  CALCIUM 7.7* 8.0* 8.2* 8.3* 8.3*  MG  --  1.8  --   --   --    GFR: Estimated Creatinine Clearance: 29 mL/min (A) (by C-G formula based on SCr of 1.15 mg/dL (H)). Liver Function Tests: No results for input(s): AST, ALT, ALKPHOS, BILITOT, PROT, ALBUMIN in the last 168  hours. No results for input(s): LIPASE, AMYLASE in the last 168 hours. No results for input(s): AMMONIA in the last 168 hours. Coagulation Profile: No results for input(s): INR, PROTIME in the last 168 hours. Cardiac Enzymes: No results for input(s): CKTOTAL, CKMB, CKMBINDEX, TROPONINI in the last 168 hours. BNP (last 3 results) No results for input(s): PROBNP in the last 8760 hours. HbA1C: No results for input(s): HGBA1C in the last 72 hours. CBG: No results for input(s): GLUCAP in the last 168 hours. Lipid Profile: No results for input(s): CHOL, HDL, LDLCALC, TRIG, CHOLHDL, LDLDIRECT in the last 72 hours. Thyroid Function Tests: No results for input(s): TSH, T4TOTAL, FREET4, T3FREE, THYROIDAB in the last 72 hours. Anemia Panel: No results for input(s): VITAMINB12, FOLATE, FERRITIN, TIBC, IRON, RETICCTPCT in the last 72 hours. Sepsis Labs: No results for input(s): PROCALCITON, LATICACIDVEN in the last 168 hours.  Recent Results (from the past 240 hour(s))  Resp Panel by RT-PCR (Flu A&B, Covid) Nasopharyngeal Swab     Status: Abnormal   Collection Time: 09/07/21 10:07 PM   Specimen: Nasopharyngeal Swab; Nasopharyngeal(NP) swabs in vial transport medium  Result Value Ref Range Status   SARS Coronavirus 2 by RT PCR POSITIVE (A) NEGATIVE Final    Comment: RESULT CALLED TO, READ BACK BY AND VERIFIED WITH: ALYCIA BEVERLY @2310  ON 09/07/21 SKL (NOTE) SARS-CoV-2 target nucleic acids are DETECTED.  The SARS-CoV-2 RNA is generally detectable in upper respiratory specimens during the acute phase of infection. Positive results are indicative of the presence of the identified virus, but do not rule out bacterial infection or co-infection with other pathogens not detected by the test. Clinical correlation with patient history and other diagnostic information is necessary to determine patient infection status. The expected result is Negative.  Fact Sheet for  Patients: EntrepreneurPulse.com.au  Fact Sheet for Healthcare Providers: IncredibleEmployment.be  This test is not yet approved or cleared by the [place] FDA and  has been authorized for detection and/or diagnosis of SARS-CoV-2 by FDA under an Emergency Use Authorization (EUA).  This EUA will remain in effect (meaning this test can  be used) for the duration of  the COVID-19 declaration under Section 564(b)(1) of the Act, 21 U.S.C. section 360bbb-3(b)(1), unless the authorization is terminated or revoked sooner.     Influenza A by PCR NEGATIVE NEGATIVE Final   Influenza B by PCR NEGATIVE NEGATIVE Final    Comment: (NOTE) The Xpert Xpress SARS-CoV-2/FLU/RSV plus assay is intended as an aid in the diagnosis of influenza from Nasopharyngeal swab specimens and should  not be used as a sole basis for treatment. Nasal washings and aspirates are unacceptable for Xpert Xpress SARS-CoV-2/FLU/RSV testing.  Fact Sheet for Patients: EntrepreneurPulse.com.au  Fact Sheet for Healthcare Providers: IncredibleEmployment.be  This test is not yet approved or cleared by the [place] FDA and has been authorized for detection and/or diagnosis of SARS-CoV-2 by FDA under an Emergency Use Authorization (EUA). This EUA will remain in effect (meaning this test can be used) for the duration of the COVID-19 declaration under Section 564(b)(1) of the Act, 21 U.S.C. section 360bbb-3(b)(1), unless the authorization is terminated or revoked.  Performed at [facility], [address], [place], [place] [address]          Radiology Studies: No results found.      Scheduled Meds:  acetaminophen  1,000 mg Oral Q8H   Chlorhexidine Gluconate Cloth  6 each Topical Daily   enoxaparin (LOVENOX) injection  30 mg Subcutaneous Q24H   feeding supplement  237 mL Oral TID BM   pantoprazole  40 mg Oral BID    polyethylene glycol  17 g Oral BID   QUEtiapine  12.5 mg Oral QHS   senna-docusate  2 tablet Oral BID   sertraline  50 mg Oral Daily   simvastatin  20 mg Oral QHS   vitamin B-12  1,000 mcg Oral Daily   Continuous Infusions:  methocarbamol (ROBAXIN) IV       LOS: 9 days    Time spent: 35 minutes with more than 50% on [place], MD [facility]   To contact the attending provider between 7A-7P or the covering provider during after hours 7P-7A, please log into the web site [email or web address] and access using universal [place] password for that web site. If you do not have the password, please call the hospital operator.  09/16/2021, 3:32 PM

## 2021-09-16 NOTE — Progress Notes (Signed)
Physical Therapy Treatment Patient Details Name: Autumn Johnston MRN: [id number] DOB: 09-Apr-1922 Today's Date: 09/16/2021   History of Present Illness Autumn Johnston is a 39yoF history of colon cancer status post colostomy, CKD3b, HTN, HLD, coming in after a fall and found to have a left hip fracture.  S/p L hip IM nail on 09/08/2021. Pt (+) COVID19, appears to be incidental finding.    PT Comments    Pt received laying supine in bed and motivated to participate in treatment despite reporting 6/10 pain prior to intervention. Pt able to demonstrate sit to stand transfers with minA from bed and recliner chair and ambulate with CGA 20 ft x 2 with rest breaks in between due to reports of shortness of breath. Oxygen monitored throughout and remained >90% on room air. Pt able to complete all exercises with verbal cues for improved form. Pt continues to be at increased risk of falls and would benefit from further therapy to maximize independence and return to PLOF.    Recommendations for follow up therapy are one component of a multi-disciplinary discharge planning process, led by the attending physician.  Recommendations may be updated based on patient status, additional functional criteria and insurance authorization.  Follow Up Recommendations  SNF     Equipment Recommendations  None recommended by PT    Recommendations for Other Services       Precautions / Restrictions Precautions Precautions: Fall Precaution Comments: colostomy bag Restrictions Weight Bearing Restrictions: Yes LLE Weight Bearing: Weight bearing as tolerated Other Position/Activity Restrictions: Colostomy bag     Mobility  Bed Mobility Overal bed mobility: Needs Assistance Bed Mobility: Supine to Sit;Sit to Supine     Supine to sit: Min assist Sit to supine: Min assist   General bed mobility comments: MinA for assistance with trunk and verbal cues for movement of B LEs off EOB    Transfers Overall  transfer level: Needs assistance Equipment used: Rolling walker (2 wheeled) Transfers: Sit to/from Stand Sit to Stand: Min assist         General transfer comment: MinA for lower surfaces with verbal cues for usage of B UEs to assist with pushing from recliner chair  Ambulation/Gait Ambulation/Gait assistance: Min guard Gait Distance (Feet): 20 Feet x 2 Assistive device: Rolling walker (2 wheeled) Gait Pattern/deviations: Step-through pattern;Decreased step length - right;Decreased step length - left;Decreased stance time - left;Antalgic     General Gait Details: Decreased fluidity of gait however, no LOB throughout with usage of RW. Pt continues to lack heel strike and terminal stance bilaterally   Stairs             Wheelchair Mobility    Modified Rankin (Stroke Patients Only)       Balance Overall balance assessment: Needs assistance Sitting-balance support: No upper extremity supported;Feet supported Sitting balance-Leahy Scale: Good     Standing balance support: Bilateral upper extremity supported;During functional activity Standing balance-Leahy Scale: Fair Standing balance comment: Light usage of RW during ambulation. No LOB during turns with verbal cues for improved RW management                            Cognition Arousal/Alertness: Awake/alert Behavior During Therapy: WFL for tasks assessed/performed Overall Cognitive Status: Within Functional Limits for tasks assessed         Exercises Total Joint Exercises Ankle Circles/Pumps: AROM;Strengthening;Both;20 reps;Supine Gluteal Sets: AROM;Both;20 reps;Supine Heel Slides: AAROM;Strengthening;Left;20 reps;Supine Hip ABduction/ADduction: AAROM;Left;20 reps;Supine Long Arc  Quad: AROM;Strengthening;Both;20 reps;Seated Other Exercises Other Exercises: Standing therex: Calf raises 1 x 15 , Marching 1 x 10 alternating Other Exercises: Additional seated therex: adductor squeeze 2 x 10, seated  marching AROM 2 x 10 bilaterally (decreased ROM on L LE)    General Comments        Pertinent Vitals/Pain Pain Assessment: 0-10 Pain Score: 6  Pain Location: L hip Pain Descriptors / Indicators: Aching;Grimacing;Guarding Pain Intervention(s): Limited activity within patient's tolerance;Monitored during session;Repositioned;Patient requesting pain meds-RN notified    Home Living                      Prior Function            PT Goals (current goals can now be found in the care plan section) Acute Rehab PT Goals Patient Stated Goal: To feel better then go home PT Goal Formulation: With patient Time For Goal Achievement: 09/23/21 Potential to Achieve Goals: Fair Progress towards PT goals: Progressing toward goals    Frequency    7X/week      PT Plan Current plan remains appropriate    Co-evaluation              AM-PAC PT "6 Clicks" Mobility   Outcome Measure  Help needed turning from your back to your side while in a flat bed without using bedrails?: A Little Help needed moving from lying on your back to sitting on the side of a flat bed without using bedrails?: A Little Help needed moving to and from a bed to a chair (including a wheelchair)?: A Little Help needed standing up from a chair using your arms (e.g., wheelchair or bedside chair)?: A Little Help needed to walk in hospital room?: A Little Help needed climbing 3-5 steps with a railing? : Total 6 Click Score: 16    End of Session Equipment Utilized During Treatment: Gait belt Activity Tolerance: Patient tolerated treatment well;No increased pain Patient left: in bed;with call bell/phone within reach;with bed alarm set   PT Visit Diagnosis: Unsteadiness on feet (R26.81);History of falling (Z91.81);Other abnormalities of gait and mobility (R26.89);Muscle weakness (generalized) (M62.81);Pain Pain - Right/Left: Left Pain - part of body: Hip     Time: 2336-1224 PT Time Calculation (min)  (ACUTE ONLY): 33 min  Charges:  $Gait Training: 8-22 mins $Therapeutic Exercise: 8-22 mins                     Andrey Campanile, SPT    Andrey Campanile 09/16/2021, 3:45 PM

## 2021-09-17 ENCOUNTER — Encounter: Payer: Self-pay | Admitting: Internal Medicine

## 2021-09-17 DIAGNOSIS — S72002S Fracture of unspecified part of neck of left femur, sequela: Secondary | ICD-10-CM | POA: Diagnosis not present

## 2021-09-17 DIAGNOSIS — S72002A Fracture of unspecified part of neck of left femur, initial encounter for closed fracture: Secondary | ICD-10-CM | POA: Diagnosis not present

## 2021-09-17 NOTE — TOC Progression Note (Addendum)
Transition of Care [facility]) - Progression Note    Patient Details  Name: Euphemia Lingerfelt MRN: [id number] Date of Birth: Sep 09, 1922  Transition [facility]) CM/SW [place], RN Phone Number: 09/17/2021, 1:23 PM  Clinical Narrative:   As per Neoma Laming at [facility], patient will be on Copay days, but she has a Consulting civil engineer of [employer].  Deborah cannot 100% guarantee payment by the secondary insurance.  Daughter will contact [employer] to get further information prior to consenting to [facility].  Addendum: Patient's daughter verified payment with [employer].  Daughter consented to [facility] to transfer tomorrow if medically ready to discharge.   Vara Guardian aware.       Expected Discharge Plan and Services                                                 Social Determinants of Health (SDOH) Interventions    Readmission Risk Interventions Readmission Risk Prevention Plan 09/17/2021  Transportation Screening Complete  Medication Review Press photographer) Complete  PCP or Specialist appointment within 3-5 days of discharge Complete  HRI or [place] Complete  SW Recovery Care/Counseling Consult Not Complete  SW Consult Not Complete Comments RNCM assigned to case  Palliative Care Screening Not Applicable  Comments na  Skilled Nursing Facility Complete  Some recent data might be hidden

## 2021-09-17 NOTE — Care Management Important Message (Signed)
Important Message  Patient Details  Name: Autumn Johnston MRN: [id number] Date of Birth: Dec 30, 1921   Medicare Important Message Given:  Yes  I reviewed the Important Message from Medicare with her daughter, [name] [phone]) and she said they were in agreement with the discharge plan for tomorrow.  I asked if she would like a copy and she replied yes.  I will send via certified mail to: [name], [address] [place], [place] [address] as directed.  I thanked her for her time.   Juliann Pulse A Aaidyn San 09/17/2021, 3:50 PM

## 2021-09-17 NOTE — Progress Notes (Signed)
Physical Therapy Treatment Patient Details Name: Taje Tondreau MRN: [id number] DOB: 21-Feb-1922 Today's Date: 09/17/2021   History of Present Illness Allannah Kempen is a 13yoF history of colon cancer status post colostomy, CKD3b, HTN, HLD, coming in after a fall and found to have a left hip fracture.  S/p L hip IM nail on 09/08/2021. Pt (+) COVID19, appears to be incidental finding.    PT Comments    Pt continues to make progress towards her therapy goals however, continues to require minA/CGA for safety with mobility. Pt continues to be a high fall risk and will benefit from further skilled PT to improve ROM, strength, balance. Pt will benefit from SNF to address impairments, reduce fall risk, and improve independence.     Recommendations for follow up therapy are one component of a multi-disciplinary discharge planning process, led by the attending physician.  Recommendations may be updated based on patient status, additional functional criteria and insurance authorization.  Follow Up Recommendations  SNF     Equipment Recommendations  None recommended by PT    Recommendations for Other Services       Precautions / Restrictions Restrictions Weight Bearing Restrictions: Yes LLE Weight Bearing: Weight bearing as tolerated     Mobility  Bed Mobility Overal bed mobility: Needs Assistance Bed Mobility: Supine to Sit     Supine to sit: Min assist     General bed mobility comments: MinA at trunk and verbal cues for movement of B LEs off EOB    Transfers Overall transfer level: Needs assistance Equipment used: Rolling walker (2 wheeled) Transfers: Sit to/from Stand Sit to Stand: Min guard;Min assist         General transfer comment: MinA from lower surfaces, CGA for higher surfaces  Ambulation/Gait Ambulation/Gait assistance: Min guard Gait Distance (Feet): 20 Feet Assistive device: Rolling walker (2 wheeled) Gait Pattern/deviations: Step-through  pattern;Decreased stride length;Narrow base of support     General Gait Details: Pt demonstrating lack of heel strike bilaterally and unable to change gait pattern with cues likely due to difficulty hearing   Stairs             Wheelchair Mobility    Modified Rankin (Stroke Patients Only)       Balance Overall balance assessment: Needs assistance Sitting-balance support: Feet supported;No upper extremity supported Sitting balance-Leahy Scale: Good     Standing balance support: Single extremity supported;During functional activity Standing balance-Leahy Scale: Fair Standing balance comment: Demonstrated good balance with 1 UE usage during tolieting for peri care. Pt prefers to complete peri care in partial squat position.         Cognition Arousal/Alertness: Awake/alert Behavior During Therapy: WFL for tasks assessed/performed Overall Cognitive Status: Within Functional Limits for tasks assessed               Exercises Total Joint Exercises Ankle Circles/Pumps: AROM;Strengthening;Both;20 reps;Supine Short Arc Quad: AROM;Strengthening;Both;20 reps;Supine Heel Slides: AAROM;Strengthening;Left;20 reps;Supine Hip ABduction/ADduction: AAROM;Strengthening;Both;20 reps;Supine Other Exercises Other Exercises: Pt assisted to bathroom during ambulation trial requiring CGA. Pt prefers to complete partial squat to complete pericare and usage of 1 UE assist to maintain balance with close SBA. Pt set up in chair at end of session with breakfast tray and assisted with opening all containers.    General Comments        Pertinent Vitals/Pain Pain Assessment: Faces Faces Pain Scale: Hurts little more Pain Location: L hip Pain Descriptors / Indicators: Aching;Grimacing;Guarding Pain Intervention(s): Limited activity within patient's tolerance;Monitored during session;Patient requesting  pain meds-RN notified    Home Living                      Prior Function             PT Goals (current goals can now be found in the care plan section) Acute Rehab PT Goals Patient Stated Goal: To feel better then go home PT Goal Formulation: With patient Time For Goal Achievement: 09/23/21 Potential to Achieve Goals: Fair Progress towards PT goals: Progressing toward goals    Frequency    7X/week      PT Plan Current plan remains appropriate    Co-evaluation              AM-PAC PT "6 Clicks" Mobility   Outcome Measure  Help needed turning from your back to your side while in a flat bed without using bedrails?: A Little Help needed moving from lying on your back to sitting on the side of a flat bed without using bedrails?: A Little Help needed moving to and from a bed to a chair (including a wheelchair)?: A Little Help needed standing up from a chair using your arms (e.g., wheelchair or bedside chair)?: A Little Help needed to walk in hospital room?: A Little Help needed climbing 3-5 steps with a railing? : A Lot 6 Click Score: 17    End of Session Equipment Utilized During Treatment: Gait belt Activity Tolerance: Patient tolerated treatment well;No increased pain Patient left: in chair;with call bell/phone within reach;with chair alarm set Nurse Communication: Mobility status;Patient requests pain meds PT Visit Diagnosis: Unsteadiness on feet (R26.81);History of falling (Z91.81);Other abnormalities of gait and mobility (R26.89);Muscle weakness (generalized) (M62.81);Pain Pain - Right/Left: Left Pain - part of body: Hip     Time: 0926-0953 PT Time Calculation (min) (ACUTE ONLY): 27 min  Charges:  $Gait Training: 8-22 mins $Therapeutic Activity: 8-22 mins                     Andrey Campanile, SPT    Andrey Campanile 09/17/2021, 1:38 PM

## 2021-09-17 NOTE — Progress Notes (Signed)
Occupational Therapy Treatment Patient Details Name: Autumn Johnston MRN: [id number] DOB: May 04, 1922 Today's Date: 09/17/2021   History of present illness Autumn Johnston is a 83yoF history of colon cancer status post colostomy, CKD3b, HTN, HLD, coming in after a fall and found to have a left hip fracture.  S/p L hip IM nail on 09/08/2021. Pt (+) COVID19, appears to be incidental finding.   OT comments  Upon entering the room, pt supine in bed and concerned over colostomy and reports abdomen and stool feels hard. OT notifying RN of pt concerns. Pt requests to "move around to see if it helps". Pt needing min A to stand from lower bed surface and ambulating 30' with min guard and use of RW. Pt taking seated rest break and needing CGA from higher surface to stand and performs 30' of ambulation with CGA  and stopping to wash hands at sink without LOB. RN enters the room and pt returns to bed for RN to assess pt's concerns. Min guard for sit >supine. All needs within reach.    Recommendations for follow up therapy are one component of a multi-disciplinary discharge planning process, led by the attending physician.  Recommendations may be updated based on patient status, additional functional criteria and insurance authorization.    Follow Up Recommendations  SNF    Equipment Recommendations  3 in 1 bedside commode       Precautions / Restrictions Precautions Precautions: Fall Precaution Comments: colostomy bag Restrictions Weight Bearing Restrictions: Yes LLE Weight Bearing: Weight bearing as tolerated Other Position/Activity Restrictions: Colostomy bag       Mobility Bed Mobility Overal bed mobility: Needs Assistance Bed Mobility: Supine to Sit;Sit to Supine     Supine to sit: Min assist Sit to supine: Min assist   General bed mobility comments: MinA at trunk and verbal cues for movement of B LEs off EOB    Transfers Overall transfer level: Needs assistance Equipment used:  Rolling walker (2 wheeled) Transfers: Sit to/from Stand Sit to Stand: Min guard;Min assist         General transfer comment: MinA from lower surfaces, CGA for higher surfaces    Balance Overall balance assessment: Needs assistance Sitting-balance support: Feet supported;No upper extremity supported Sitting balance-Leahy Scale: Good     Standing balance support: Single extremity supported;During functional activity Standing balance-Leahy Scale: Fair Standing balance comment: Demonstrated good balance with 1 UE usage during tolieting for peri care. Pt prefers to complete peri care in partial squat position.                           ADL either performed or assessed with clinical judgement     Vision Patient Visual Report: No change from baseline            Cognition Arousal/Alertness: Awake/alert Behavior During Therapy: WFL for tasks assessed/performed Overall Cognitive Status: Within Functional Limits for tasks assessed                                          Exercises Total Joint Exercises Ankle Circles/Pumps: AROM;Strengthening;Both;20 reps;Supine Short Arc Quad: AROM;Strengthening;Both;20 reps;Supine Heel Slides: AAROM;Strengthening;Left;20 reps;Supine Hip ABduction/ADduction: AAROM;Strengthening;Both;20 reps;Supine Other Exercises Other Exercises: Pt assisted to bathroom during ambulation trial requiring CGA. Pt prefers to complete partial squat to complete pericare and usage of 1 UE assist to maintain balance with close  SBA. Pt set up in chair at end of session with breakfast tray and assisted with opening all containers.           Pertinent Vitals/ Pain       Pain Assessment: Faces Faces Pain Scale: Hurts little more Pain Location: abdomen Pain Descriptors / Indicators: Discomfort Pain Intervention(s): Limited activity within patient's tolerance;Monitored during session;Repositioned         Frequency  Min 2X/week         Progress Toward Goals  OT Goals(current goals can now be found in the care plan section)  Progress towards OT goals: Progressing toward goals  Acute Rehab OT Goals Patient Stated Goal: To feel better then go home OT Goal Formulation: With patient Time For Goal Achievement: 09/24/21 Potential to Achieve Goals: Good  Plan Discharge plan remains appropriate;Frequency remains appropriate       AM-PAC OT "6 Clicks" Daily Activity     Outcome Measure   Help from another person eating meals?: A Little Help from another person taking care of personal grooming?: A Little Help from another person toileting, which includes using toliet, bedpan, or urinal?: A Little Help from another person bathing (including washing, rinsing, drying)?: A Lot Help from another person to put on and taking off regular upper body clothing?: A Little Help from another person to put on and taking off regular lower body clothing?: A Lot 6 Click Score: 16    End of Session Equipment Utilized During Treatment: Rolling walker  OT Visit Diagnosis: Other abnormalities of gait and mobility (R26.89);Muscle weakness (generalized) (M62.81)   Activity Tolerance Patient tolerated treatment well   Patient Left in bed;with call bell/phone within reach;with bed alarm set   Nurse Communication Mobility status        Time: 5284-1324 OT Time Calculation (min): 24 min  Charges: OT General Charges $OT Visit: 1 Visit OT Treatments $Therapeutic Activity: 23-37 mins  Darleen Crocker, MS, OTR/L , CBIS ascom [phone]  09/17/21, 4:02 PM

## 2021-09-17 NOTE — TOC Progression Note (Signed)
Transition of Care [facility]) - Progression Note    Patient Details  Name: Autumn Johnston MRN: [id number] Date of Birth: Jun 20, 1922  Transition of Care [facility]) CM/SW [place], RN Phone Number: 09/17/2021, 9:38 AM  Clinical Narrative:   Daughter advised that patient was not accepted to [facility].  Presented choices and daughter chose [facility] in [place].  Message left with Winnifred Friar, awaiting return call.  Patient will be off of precautions tomorrow, and can be discharged at that time.           Expected Discharge Plan and Services                                                 Social Determinants of Health (SDOH) Interventions    Readmission Risk Interventions Readmission Risk Prevention Plan 09/17/2021  Transportation Screening Complete  Medication Review Press photographer) Complete  PCP or Specialist appointment within 3-5 days of discharge Complete  HRI or [place] Complete  SW Recovery Care/Counseling Consult Not Complete  SW Consult Not Complete Comments RNCM assigned to case  Palliative Care Screening Not Applicable  Comments na  Skilled Nursing Facility Complete  Some recent data might be hidden

## 2021-09-17 NOTE — Progress Notes (Signed)
PROGRESS NOTE    Autumn Johnston  OZD:[id number] DOB: [birth year] DOA: 09/07/2021 PCP: Idelle Crouch, MD    Brief Narrative:  87 year old female with history of colon cancer status post colostomy, chronic kidney disease stage IIIb, hypertension hyperlipidemia coming in after a fall and found to have a left hip fracture.  Patient had surgery on 09/08/2021.  Hemoglobin dropped down to 5.5 postoperatively and given 1 unit of packed red blood cells and hemoglobin came up to 8.1 this afternoon.  CT scan abdomen pelvis showed peristomal hernia.  9/28 no complaints , no overnight issues   Assessment & Plan:   Principal Problem:   Closed hip fracture requiring operative repair, left, sequela Active Problems:   Anemia of chronic disease   Essential hypertension   Hyponatremia   Fall at home, initial encounter   Stage 3b chronic kidney disease ([place])   Acute blood loss anemia   COVID-19 virus infection   Left lower quadrant abdominal mass   Hypotension   Pressure injury of skin  Left hip fracture s/p repair 9/19. Acute blood loss anemia. Iron deficient anemia. Vitamin B12 deficient anemia. Patient had 1 PRBC, 300mg  iron sucrose 300mg x2, B12 shots x4.  Hemoglobin dropped down to 6.9 again, s/p transfusion on 9/26 9/28 hemoglobin stable  Asyptomatic COVID infection. No need for treatment  Constipation. Peristomal hernia. Continue stool softeners   DVT prophylaxis: Home  Code Status: SNF Family Communication: None at bedside Disposition Plan:      Status is: Inpatient   Remains inpatient appropriate because:Unsafe d/c plan   Dispo: The patient is from: Home              Anticipated d/c is to: SNF              Patient currently is medically stable to d/c.              Difficult to place patient No   Needs 10 days of quarantine prior to SNF placement.  Possible DC in a.m. to [facility]    No intake/output data recorded. No intake/output data recorded.      Consultants:  Orthopedics  Procedures: Hip  Antimicrobials: None  Subjective: No shortness of breath, chest pain or diarrhea  Objective: Vitals:   09/16/21 1610 09/16/21 2333 09/17/21 0818 09/17/21 1149  BP: (!) 151/72 130/65 (!) 149/62 134/61  Pulse: 82 75 74 73  Resp: 18 17 16 16   Temp: 98.1 F (36.7 C) 98 F (36.7 C) 97.6 F (36.4 C) 97.8 F (36.6 C)  TempSrc: Oral Oral Oral Oral  SpO2: 99% 94% 96% 98%  Weight:      Height:       No intake or output data in the 24 hours ending 09/17/21 1506  Filed Weights   09/07/21 2018  Weight: 82.1 kg    Examination: Calm, NAD CTA no wheeze Regular S1-S2 no gallops Soft benign positive bowel sounds No edema Awake and alert   Data Reviewed: I have personally reviewed following labs and imaging studies  CBC: Recent Labs  Lab 09/11/21 0421 09/13/21 0537 09/14/21 0537 09/15/21 0430 09/15/21 2013 09/16/21 0504  WBC 6.0 5.7 5.2 5.8  --  8.0  NEUTROABS  --  3.5 3.0 3.5  --  5.5  HGB 8.7* 7.7* 7.3* 6.9* 9.2* 9.4*  HCT 25.6* 22.9* 22.1* 20.9* 26.1* 27.7*  MCV 88.3 91.6 93.2 93.7  --  92.6  PLT 222 252 270 260  --  288   Basic  Metabolic Panel: Recent Labs  Lab 09/11/21 0421 09/13/21 0537 09/14/21 0537 09/16/21 0504  NA 133* 133* 132* 132*  K 4.0 3.9 3.8 4.0  CL 104 100 99 98  CO2 21* 25 24 26   GLUCOSE 84 88 94 93  BUN 25* 29* 35* 30*  CREATININE 1.29* 1.14* 1.25* 1.15*  CALCIUM 8.0* 8.2* 8.3* 8.3*  MG 1.8  --   --   --    GFR: Estimated Creatinine Clearance: 29 mL/min (A) (by C-G formula based on SCr of 1.15 mg/dL (H)). Liver Function Tests: No results for input(s): AST, ALT, ALKPHOS, BILITOT, PROT, ALBUMIN in the last 168 hours. No results for input(s): LIPASE, AMYLASE in the last 168 hours. No results for input(s): AMMONIA in the last 168 hours. Coagulation Profile: No results for input(s): INR, PROTIME in the last 168 hours. Cardiac Enzymes: No results for input(s): CKTOTAL, CKMB, CKMBINDEX,  TROPONINI in the last 168 hours. BNP (last 3 results) No results for input(s): PROBNP in the last 8760 hours. HbA1C: No results for input(s): HGBA1C in the last 72 hours. CBG: No results for input(s): GLUCAP in the last 168 hours. Lipid Profile: No results for input(s): CHOL, HDL, LDLCALC, TRIG, CHOLHDL, LDLDIRECT in the last 72 hours. Thyroid Function Tests: No results for input(s): TSH, T4TOTAL, FREET4, T3FREE, THYROIDAB in the last 72 hours. Anemia Panel: No results for input(s): VITAMINB12, FOLATE, FERRITIN, TIBC, IRON, RETICCTPCT in the last 72 hours. Sepsis Labs: No results for input(s): PROCALCITON, LATICACIDVEN in the last 168 hours.  Recent Results (from the past 240 hour(s))  Resp Panel by RT-PCR (Flu A&B, Covid) Nasopharyngeal Swab     Status: Abnormal   Collection Time: 09/07/21 10:07 PM   Specimen: Nasopharyngeal Swab; Nasopharyngeal(NP) swabs in vial transport medium  Result Value Ref Range Status   SARS Coronavirus 2 by RT PCR POSITIVE (A) NEGATIVE Final    Comment: RESULT CALLED TO, READ BACK BY AND VERIFIED WITH: ALYCIA BEVERLY @2310  ON 09/07/21 SKL (NOTE) SARS-CoV-2 target nucleic acids are DETECTED.  The SARS-CoV-2 RNA is generally detectable in upper respiratory specimens during the acute phase of infection. Positive results are indicative of the presence of the identified virus, but do not rule out bacterial infection or co-infection with other pathogens not detected by the test. Clinical correlation with patient history and other diagnostic information is necessary to determine patient infection status. The expected result is Negative.  Fact Sheet for Patients: EntrepreneurPulse.com.au  Fact Sheet for Healthcare Providers: IncredibleEmployment.be  This test is not yet approved or cleared by the [place] FDA and  has been authorized for detection and/or diagnosis of SARS-CoV-2 by FDA under an Emergency Use  Authorization (EUA).  This EUA will remain in effect (meaning this test can  be used) for the duration of  the COVID-19 declaration under Section 564(b)(1) of the Act, 21 U.S.C. section 360bbb-3(b)(1), unless the authorization is terminated or revoked sooner.     Influenza A by PCR NEGATIVE NEGATIVE Final   Influenza B by PCR NEGATIVE NEGATIVE Final    Comment: (NOTE) The Xpert Xpress SARS-CoV-2/FLU/RSV plus assay is intended as an aid in the diagnosis of influenza from Nasopharyngeal swab specimens and should not be used as a sole basis for treatment. Nasal washings and aspirates are unacceptable for Xpert Xpress SARS-CoV-2/FLU/RSV testing.  Fact Sheet for Patients: EntrepreneurPulse.com.au  Fact Sheet for Healthcare Providers: IncredibleEmployment.be  This test is not yet approved or cleared by the [place] FDA and has been authorized for detection  and/or diagnosis of SARS-CoV-2 by FDA under an Emergency Use Authorization (EUA). This EUA will remain in effect (meaning this test can be used) for the duration of the COVID-19 declaration under Section 564(b)(1) of the Act, 21 U.S.C. section 360bbb-3(b)(1), unless the authorization is terminated or revoked.  Performed at [facility], [address] [place], [place] [address]          Radiology Studies: No results found.      Scheduled Meds:  acetaminophen  1,000 mg Oral Q8H   Chlorhexidine Gluconate Cloth  6 each Topical Daily   enoxaparin (LOVENOX) injection  30 mg Subcutaneous Q24H   feeding supplement  237 mL Oral TID BM   pantoprazole  40 mg Oral BID   polyethylene glycol  17 g Oral BID   QUEtiapine  12.5 mg Oral QHS   senna-docusate  2 tablet Oral BID   sertraline  50 mg Oral Daily   simvastatin  20 mg Oral QHS   vitamin B-12  1,000 mcg Oral Daily   Continuous Infusions:  methocarbamol (ROBAXIN) IV       LOS: 10 days    Time spent: 25 minutes with  more than 50% on [place], MD [facility]   To contact the attending provider between 7A-7P or the covering provider during after hours 7P-7A, please log into the web site [email or web address] and access using universal [place] password for that web site. If you do not have the password, please call the hospital operator.  09/17/2021, 3:06 PM

## 2021-09-18 DIAGNOSIS — S72002A Fracture of unspecified part of neck of left femur, initial encounter for closed fracture: Secondary | ICD-10-CM | POA: Diagnosis not present

## 2021-09-18 DIAGNOSIS — S72002S Fracture of unspecified part of neck of left femur, sequela: Secondary | ICD-10-CM | POA: Diagnosis not present

## 2021-09-18 MED ORDER — CYANOCOBALAMIN 1000 MCG PO TABS: 1000.0000 ug | ORAL_TABLET | Freq: Every day | ORAL | 0 refills | Status: AC

## 2021-09-18 MED ORDER — POLYETHYLENE GLYCOL 3350 17 G PO PACK: 17.0000 g | PACK | Freq: Two times a day (BID) | ORAL | 0 refills | Status: DC

## 2021-09-18 MED ORDER — PANTOPRAZOLE SODIUM 40 MG PO TBEC: 40.0000 mg | DELAYED_RELEASE_TABLET | Freq: Two times a day (BID) | ORAL | Status: DC

## 2021-09-18 MED ORDER — SENNOSIDES-DOCUSATE SODIUM 8.6-50 MG PO TABS: 2.0000 | ORAL_TABLET | Freq: Two times a day (BID) | ORAL | Status: DC

## 2021-09-18 MED ORDER — QUETIAPINE FUMARATE 25 MG PO TABS: 12.5000 mg | ORAL_TABLET | Freq: Every day | ORAL | Status: DC

## 2021-09-18 NOTE — TOC Progression Note (Addendum)
Transition of Care [facility]) - Progression Note    Patient Details  Name: Autumn Johnston MRN: [id number] Date of Birth: 1922/09/03  Transition of Care [facility]) CM/SW [place], RN Phone Number: 09/18/2021, 10:35 AM  Clinical Narrative:  Patient is discharged and can transfer to [facility], awaiting  Neoma Laming from [place] to return call.    Addendum:  Patient will go to room 320 at [facility] as per Vara Guardian.  EMS called to pick patient up at noon.  Family aware of transfer.         Expected Discharge Plan and Services           Expected Discharge Date: 09/18/21                                     Social Determinants of Health (SDOH) Interventions    Readmission Risk Interventions Readmission Risk Prevention Plan 09/17/2021  Transportation Screening Complete  Medication Review Press photographer) Complete  PCP or Specialist appointment within 3-5 days of discharge Complete  HRI or [place] Complete  SW Recovery Care/Counseling Consult Not Complete  SW Consult Not Complete Comments RNCM assigned to case  Palliative Care Screening Not Applicable  Comments na  Skilled Nursing Facility Complete  Some recent data might be hidden

## 2021-09-18 NOTE — Progress Notes (Signed)
Report given to Engineer, site at [facility]

## 2021-09-18 NOTE — Discharge Summary (Signed)
Autumn Johnston VHQ:[id number] DOB: Jun 18, 1922 DOA: 09/07/2021  PCP: Idelle Crouch, MD  Admit date: 09/07/2021 Discharge date: 09/18/2021  Admitted From: home Disposition:  Autumn Johnston  Recommendations for Outpatient Follow-up:  Follow up with PCP in 1 week Please obtain BMP/CBC in one week Please follow up with Orthopedicsin 2 weeks for staple remoal and Xray of left hip     Discharge Condition:Stable CODE STATUS:DNR  Diet recommendation: Regular    Brief/Interim Summary: PER HPI: Autumn Johnston is a 87 y.o. female with medical history significant of HTN, CKD4, colon cancer s/p colectomy with colostomy, hyponatremia, elevated troponin, malnutrition presented with fall, left hip pain.Left hip x-ray: Minimally displaced intertrochanteric fracture of the proximal left femur.  Orthopedics was consulted. Patient had surgery on 09/08/2021.  Hemoglobin dropped down to 5.5 postoperatively and given 1 unit of packed red blood cells .CT scan abdomen pelvis showed peristomal hernia (see full report below). It appears her Hg dropped again later on to 6.9. Was transfused again on 9/26. Her Hg has remained stable. She was found to be Covid positive and had to quarantine for 10 days prior to being transferred to SNF. She is stable for discharge today.   Left hip fracture s/p repair 9/19. Acute blood loss anemia.Post op Iron deficient anemia. Vitamin B12 deficient anemia. Patient had 1 PRBC, 300mg  iron sucrose 300mg x2, B12 shots x4.  Hemoglobin dropped down to 6.9 again, s/p transfusion on 9/26. Hg remains stable. continue Lovenox 40mg  daily for DVT prophylaxis. Staples can be removed by SNF on 09/22/21.  Follow-up with [facility] in 6 weeks for staple removal. Weight-Bearing as tolerated to Left leg   Asyptomatic COVID infection. No need for treatment Has quarantine for 10 days prior to going to SNF today   Constipation. Peristomal hernia. Continue stool softeners, adjust  dosing as required.    Discharge Diagnoses:  Principal Problem:   Closed hip fracture requiring operative repair, left, sequela Active Problems:   Anemia of chronic disease   Essential hypertension   Hyponatremia   Fall at home, initial encounter   Stage 3b chronic kidney disease ([place])   Acute blood loss anemia   COVID-19 virus infection   Left lower quadrant abdominal mass   Hypotension   Pressure injury of skin    Discharge Instructions   Allergies as of 09/18/2021   No Known Allergies      Medication List     STOP taking these medications    amLODipine 10 MG tablet Commonly known as: NORVASC   aspirin 81 MG EC tablet   metoprolol tartrate 25 MG tablet Commonly known as: LOPRESSOR   zolpidem 10 MG tablet Commonly known as: AMBIEN       TAKE these medications    ALPRAZolam 0.25 MG tablet Commonly known as: XANAX Take 1 tablet (0.25 mg total) by mouth 2 (two) times daily as needed.   cyanocobalamin 1000 MCG tablet Take 1 tablet (1,000 mcg total) by mouth daily.   enoxaparin 40 MG/0.4ML injection Commonly known as: LOVENOX Inject 0.4 mLs (40 mg total) into the skin daily for 14 days.   feeding supplement Liqd Take 237 mLs by mouth 3 (three) times daily between meals.   oxyCODONE 5 MG immediate release tablet Commonly known as: Oxy IR/ROXICODONE Take 0.5-1 tablets (2.5-5 mg total) by mouth every 6 (six) hours as needed for moderate pain (pain score 4-6).   pantoprazole 40 MG tablet Commonly known as: PROTONIX Take 1 tablet (40 mg total) by mouth  2 (two) times daily.   polyethylene glycol 17 g packet Commonly known as: MIRALAX / GLYCOLAX Take 17 g by mouth 2 (two) times daily.   QUEtiapine 25 MG tablet Commonly known as: SEROQUEL Take 0.5 tablets (12.5 mg total) by mouth at bedtime. What changed: how much to take   senna-docusate 8.6-50 MG tablet Commonly known as: Senokot-S Take 2 tablets by mouth 2 (two) times daily.   sertraline 50  MG tablet Commonly known as: ZOLOFT Take 50 mg by mouth daily.   simvastatin 20 MG tablet Commonly known as: ZOCOR Take 20 mg by mouth at bedtime.   traMADol 50 MG tablet Commonly known as: ULTRAM Take 1 tablet (50 mg total) by mouth every 6 (six) hours as needed for moderate pain.        Contact information for follow-up providers     Reche Dixon, PA-C Follow up in 2 week(s).   Specialty: Orthopedic Surgery Why: For staple removal and Xrays of Left Hip Contact information: [address] [place] [address] [phone]              Contact information for after-discharge care     Destination     [facility] .   Service: Skilled Nursing Contact information: [address] [place] [phone]                    No Known Allergies  Consultations: Orthopedics   Procedures/Studies: CT ABDOMEN PELVIS WO CONTRAST  Result Date: 09/10/2021 CLINICAL DATA:  87 year old female with history of colorectal cancer. Evaluate for potential mass versus hard stool in the left lower quadrant. EXAM: CT ABDOMEN AND PELVIS WITHOUT CONTRAST TECHNIQUE: Multidetector CT imaging of the abdomen and pelvis was performed following the standard protocol without IV contrast. COMPARISON:  No priors. FINDINGS: Lower chest: Trace bilateral pleural effusions lying dependently. Atherosclerotic calcifications in the distal descending thoracic aorta as well as the right coronary artery. Hepatobiliary: Multiple low-attenuation lesions noted throughout the hepatic parenchyma, incompletely characterized on today's non-contrast CT examination, but statistically likely to represent cysts. Multiple calcified and partially calcified gallstones are noted in the lumen of the gallbladder. Gallbladder is nearly decompressed, and otherwise unremarkable in appearance. Pancreas: No definite pancreatic mass or  peripancreatic fluid collections or inflammatory changes are noted on today's noncontrast CT examination. Spleen: Unremarkable. Adrenals/Urinary Tract: Exophytic low-attenuation lesions in the kidneys bilaterally, incompletely characterized but statistically likely to represent cysts, largest of which is on the right side in the interpolar region posterolaterally measuring 1.8 x 1.4 cm. No hydroureteronephrosis. Small amount of gas non dependently in the lumen of the urinary bladder. Urinary bladder is otherwise unremarkable in appearance on today's noncontrast examination. Calcifications in the right adrenal gland, likely related to remote right adrenal hemorrhage or infection. Left adrenal gland is unremarkable in appearance. Stomach/Bowel: Unenhanced appearance of the stomach is normal. No pathologic dilatation of small bowel or colon. Postoperative changes in the low anatomic pelvis likely from prior low anterior resection, poorly demonstrated on today's examination secondary to beam hardening artifact from multiple metallic clips. Left lower quadrant colostomy with large peristomal hernia containing multiple loops of small bowel. No other unexpected soft tissue mass noted in the left lower quadrant of the abdomen/pelvis. Normal appendix. Vascular/Lymphatic: Aortic atherosclerosis. No definite lymphadenopathy noted in the abdomen or pelvis on today's noncontrast examination. Reproductive: Uterus and ovaries are not confidently identified may be surgically absent or atrophic. Other: No  significant volume of ascites.  No pneumoperitoneum. Musculoskeletal: Postoperative changes of recent ORIF in the left hip with extensive gas in the deep musculature of the left gluteal region and upper thigh, and soft tissue stranding in the overlying subcutaneous fat with adjacent skin staple. Old healed fracture of the left inferior pubic ramus. There are no aggressive appearing lytic or blastic lesions noted in the visualized  portions of the skeleton. IMPRESSION: 1. The only mass in the left lower quadrant is a large peristomal hernia associated with the patient's left lower quadrant colostomy which contains multiple loops of small bowel and colon. 2. Postoperative changes of recent left hip ORIF, as above. 3. Trace bilateral pleural effusions lying dependently. 4. Small amount of gas non dependently in the lumen of the urinary bladder. This is presumably iatrogenic related to recent catheterization in this patient who has recently undergone surgery on the left hip. If there is no recent history of catheterization, correlation with urinalysis would be recommended to exclude the possibility of urinary tract infection with gas-forming organisms. 5. Aortic atherosclerosis. 6. Additional incidental findings, as above. Electronically Signed   By: Vinnie Langton M.D.   On: 09/10/2021 15:02   DG Chest Portable 1 View  Result Date: 09/07/2021 CLINICAL DATA:  Pain after a fall. EXAM: PORTABLE CHEST 1 VIEW COMPARISON:  07/27/2021 FINDINGS: Cardiac enlargement. No vascular congestion, edema, or consolidation in the lungs. Emphysematous changes. Peribronchial thickening consistent with chronic bronchitis. No pleural effusions. No pneumothorax. Calcification of the aorta. Similar appearance to previous study. IMPRESSION: Emphysematous and chronic bronchitic changes in the lungs. No evidence of active pulmonary disease. Electronically Signed   By: Lucienne Capers M.D.   On: 09/07/2021 21:53   DG HIP OPERATIVE UNILAT W OR W/O PELVIS LEFT  Result Date: 09/08/2021 CLINICAL DATA:  Left hip IM nail. EXAM: OPERATIVE LEFT HIP (WITH PELVIS IF PERFORMED) TECHNIQUE: Fluoroscopic spot image(s) were submitted for interpretation post-operatively. COMPARISON:  Preoperative imaging yesterday. FINDINGS: Two fluoroscopic spot views of the left hip obtained in the operating room. Intramedullary nail with 2 trans trochanteric and single distal locking screw  traverse intertrochanteric femur fracture. Fluoroscopy time 1 minutes 9 seconds. IMPRESSION: Fluoroscopic spot views for left hip intertrochanteric fracture fixation. Electronically Signed   By: Keith Rake M.D.   On: 09/08/2021 17:00   DG Hip Unilat W or Wo Pelvis 2-3 Views Left  Result Date: 09/07/2021 CLINICAL DATA:  Left hip pain EXAM: DG HIP (WITH OR WITHOUT PELVIS) 2-3V LEFT COMPARISON:  None. FINDINGS: There is a minimally displaced intertrochanteric fracture of the proximal left femur. IMPRESSION: Minimally displaced intertrochanteric fracture of the proximal left femur. Electronically Signed   By: Ulyses Jarred M.D.   On: 09/07/2021 22:05      Subjective: Has no complaints this am. Denies sob, cp, abd pain  Discharge Exam: Vitals:   09/18/21 0543 09/18/21 0801  BP: (!) 147/63 138/72  Pulse: 80 78  Resp: 16 15  Temp: 98 F (36.7 C) 98 F (36.7 C)  SpO2: 95% 97%   Vitals:   09/17/21 1604 09/17/21 2257 09/18/21 0543 09/18/21 0801  BP: (!) 126/52 132/74 (!) 147/63 138/72  Pulse: 77 81 80 78  Resp: 15 18 16 15   Temp: 97.7 F (36.5 C) 98.6 F (37 C) 98 F (36.7 C) 98 F (36.7 C)  TempSrc:  Oral Oral   SpO2: 97% 98% 95% 97%  Weight:      Height:        General: Pt  is alert, awake, not in acute distress Cardiovascular: RRR, S1/S2 +, no rubs, no gallops Respiratory: CTA bilaterally, no wheezing, no rhonchi Abdominal: Soft, NT, ND, bowel sounds +, Colostomy bag with stool. Extremities: no edema    The results of significant diagnostics from this hospitalization (including imaging, microbiology, ancillary and laboratory) are listed below for reference.     Microbiology: No results found for this or any previous visit (from the past 240 hour(s)).   Labs: BNP (last 3 results) Recent Labs    07/27/21 2136  BNP 947.0*   Basic Metabolic Panel: Recent Labs  Lab 09/13/21 0537 09/14/21 0537 09/16/21 0504  NA 133* 132* 132*  K 3.9 3.8 4.0  CL 100 99 98   CO2 25 24 26   GLUCOSE 88 94 93  BUN 29* 35* 30*  CREATININE 1.14* 1.25* 1.15*  CALCIUM 8.2* 8.3* 8.3*   Liver Function Tests: No results for input(s): AST, ALT, ALKPHOS, BILITOT, PROT, ALBUMIN in the last 168 hours. No results for input(s): LIPASE, AMYLASE in the last 168 hours. No results for input(s): AMMONIA in the last 168 hours. CBC: Recent Labs  Lab 09/13/21 0537 09/14/21 0537 09/15/21 0430 09/15/21 2013 09/16/21 0504  WBC 5.7 5.2 5.8  --  8.0  NEUTROABS 3.5 3.0 3.5  --  5.5  HGB 7.7* 7.3* 6.9* 9.2* 9.4*  HCT 22.9* 22.1* 20.9* 26.1* 27.7*  MCV 91.6 93.2 93.7  --  92.6  PLT 252 270 260  --  288   Cardiac Enzymes: No results for input(s): CKTOTAL, CKMB, CKMBINDEX, TROPONINI in the last 168 hours. BNP: Invalid input(s): POCBNP CBG: No results for input(s): GLUCAP in the last 168 hours. D-Dimer No results for input(s): DDIMER in the last 72 hours. Hgb A1c No results for input(s): HGBA1C in the last 72 hours. Lipid Profile No results for input(s): CHOL, HDL, LDLCALC, TRIG, CHOLHDL, LDLDIRECT in the last 72 hours. Thyroid function studies No results for input(s): TSH, T4TOTAL, T3FREE, THYROIDAB in the last 72 hours.  Invalid input(s): FREET3 Anemia work up No results for input(s): VITAMINB12, FOLATE, FERRITIN, TIBC, IRON, RETICCTPCT in the last 72 hours. Urinalysis    Component Value Date/Time   COLORURINE RED (A) 07/30/2021 1101   APPEARANCEUR TURBID (A) 07/30/2021 1101   LABSPEC 1.018 07/30/2021 1101   PHURINE  07/30/2021 1101    TEST NOT REPORTED DUE TO COLOR INTERFERENCE OF URINE PIGMENT   GLUCOSEU (A) 07/30/2021 1101    TEST NOT REPORTED DUE TO COLOR INTERFERENCE OF URINE PIGMENT   HGBUR (A) 07/30/2021 1101    TEST NOT REPORTED DUE TO COLOR INTERFERENCE OF URINE PIGMENT   BILIRUBINUR (A) 07/30/2021 1101    TEST NOT REPORTED DUE TO COLOR INTERFERENCE OF URINE PIGMENT   KETONESUR (A) 07/30/2021 1101    TEST NOT REPORTED DUE TO COLOR INTERFERENCE OF URINE  PIGMENT   PROTEINUR (A) 07/30/2021 1101    TEST NOT REPORTED DUE TO COLOR INTERFERENCE OF URINE PIGMENT   NITRITE (A) 07/30/2021 1101    TEST NOT REPORTED DUE TO COLOR INTERFERENCE OF URINE PIGMENT   LEUKOCYTESUR (A) 07/30/2021 1101    TEST NOT REPORTED DUE TO COLOR INTERFERENCE OF URINE PIGMENT   Sepsis Labs Invalid input(s): PROCALCITONIN,  WBC,  LACTICIDVEN Microbiology No results found for this or any previous visit (from the past 240 hour(s)).   Time coordinating discharge: Over 30 minutes  SIGNED:   Nolberto Hanlon, MD  [facility] 09/18/2021, 9:06 AM Pager   If 7PM-7AM, please contact  night-coverage [email or web address] Password [email or web address]

## 2021-10-14 ENCOUNTER — Other Ambulatory Visit: Payer: Self-pay | Admitting: Internal Medicine

## 2021-10-14 DIAGNOSIS — M7989 Other specified soft tissue disorders: Secondary | ICD-10-CM

## 2021-10-21 ENCOUNTER — Other Ambulatory Visit: Payer: Self-pay

## 2021-10-21 ENCOUNTER — Ambulatory Visit
Admission: RE | Admit: 2021-10-21 | Discharge: 2021-10-21 | Disposition: A | Discharge status: Home / Self Care | Payer: Medicare Other | Source: Ambulatory Visit | Attending: Internal Medicine | Admitting: Internal Medicine

## 2021-10-21 DIAGNOSIS — M7989 Other specified soft tissue disorders: Secondary | ICD-10-CM | POA: Diagnosis not present

## 2022-03-30 ENCOUNTER — Inpatient Hospital Stay
Admission: EM | Admit: 2022-03-30 | Discharge: 2022-04-02 | DRG: 194 | Disposition: A | Discharge status: Home / Self Care | Payer: Medicare Other | Attending: Internal Medicine | Admitting: Internal Medicine

## 2022-03-30 ENCOUNTER — Emergency Department: Payer: Medicare Other

## 2022-03-30 ENCOUNTER — Encounter: Payer: Self-pay | Admitting: Radiology

## 2022-03-30 DIAGNOSIS — J189 Pneumonia, unspecified organism: Principal | ICD-10-CM

## 2022-03-30 DIAGNOSIS — R54 Age-related physical debility: Secondary | ICD-10-CM | POA: Diagnosis present

## 2022-03-30 DIAGNOSIS — N1831 Chronic kidney disease, stage 3a: Secondary | ICD-10-CM | POA: Diagnosis present

## 2022-03-30 DIAGNOSIS — E440 Moderate protein-calorie malnutrition: Secondary | ICD-10-CM | POA: Diagnosis present

## 2022-03-30 DIAGNOSIS — F419 Anxiety disorder, unspecified: Secondary | ICD-10-CM | POA: Diagnosis present

## 2022-03-30 DIAGNOSIS — R531 Weakness: Principal | ICD-10-CM

## 2022-03-30 DIAGNOSIS — E785 Hyperlipidemia, unspecified: Secondary | ICD-10-CM | POA: Diagnosis present

## 2022-03-30 DIAGNOSIS — R778 Other specified abnormalities of plasma proteins: Secondary | ICD-10-CM

## 2022-03-30 DIAGNOSIS — N179 Acute kidney failure, unspecified: Secondary | ICD-10-CM

## 2022-03-30 DIAGNOSIS — R7989 Other specified abnormal findings of blood chemistry: Secondary | ICD-10-CM

## 2022-03-30 DIAGNOSIS — E861 Hypovolemia: Secondary | ICD-10-CM | POA: Diagnosis present

## 2022-03-30 DIAGNOSIS — Z79899 Other long term (current) drug therapy: Secondary | ICD-10-CM

## 2022-03-30 DIAGNOSIS — R200 Anesthesia of skin: Secondary | ICD-10-CM | POA: Diagnosis present

## 2022-03-30 DIAGNOSIS — I129 Hypertensive chronic kidney disease with stage 1 through stage 4 chronic kidney disease, or unspecified chronic kidney disease: Secondary | ICD-10-CM | POA: Diagnosis present

## 2022-03-30 DIAGNOSIS — F32A Depression, unspecified: Secondary | ICD-10-CM | POA: Diagnosis present

## 2022-03-30 DIAGNOSIS — E871 Hypo-osmolality and hyponatremia: Secondary | ICD-10-CM | POA: Diagnosis present

## 2022-03-30 DIAGNOSIS — Z6820 Body mass index (BMI) 20.0-20.9, adult: Secondary | ICD-10-CM

## 2022-03-30 DIAGNOSIS — Z85038 Personal history of other malignant neoplasm of large intestine: Secondary | ICD-10-CM

## 2022-03-30 DIAGNOSIS — H9190 Unspecified hearing loss, unspecified ear: Secondary | ICD-10-CM | POA: Diagnosis present

## 2022-03-30 DIAGNOSIS — Z20822 Contact with and (suspected) exposure to covid-19: Secondary | ICD-10-CM | POA: Diagnosis present

## 2022-03-30 DIAGNOSIS — R0902 Hypoxemia: Secondary | ICD-10-CM

## 2022-03-30 DIAGNOSIS — Z933 Colostomy status: Secondary | ICD-10-CM

## 2022-03-30 DIAGNOSIS — E860 Dehydration: Secondary | ICD-10-CM

## 2022-03-30 DIAGNOSIS — D631 Anemia in chronic kidney disease: Secondary | ICD-10-CM | POA: Diagnosis present

## 2022-03-30 LAB — BASIC METABOLIC PANEL
Anion gap: 12 (ref 5–15)
BUN: 20 mg/dL (ref 8–23)
CO2: 26 mmol/L (ref 22–32)
Calcium: 9.2 mg/dL (ref 8.9–10.3)
Chloride: 96 mmol/L — ABNORMAL LOW (ref 98–111)
Creatinine, Ser: 1.32 mg/dL — ABNORMAL HIGH (ref 0.44–1.00)
GFR, Estimated: 37 mL/min — ABNORMAL LOW (ref >60)
Glucose, Bld: 121 mg/dL — ABNORMAL HIGH (ref 70–99)
Potassium: 4.2 mmol/L (ref 3.5–5.1)
Sodium: 134 mmol/L — ABNORMAL LOW (ref 135–145)

## 2022-03-30 LAB — URINALYSIS, ROUTINE W REFLEX MICROSCOPIC
Bacteria, UA: NONE SEEN
Bilirubin Urine: NEGATIVE
Glucose, UA: NEGATIVE mg/dL
Ketones, ur: NEGATIVE mg/dL
Leukocytes,Ua: NEGATIVE
Nitrite: NEGATIVE
Protein, ur: 100 mg/dL — AB
Specific Gravity, Urine: 1.006 (ref 1.005–1.030)
pH: 8 (ref 5.0–8.0)

## 2022-03-30 LAB — CBC
HCT: 43.6 % (ref 36.0–46.0)
Hemoglobin: 13.8 g/dL (ref 12.0–15.0)
MCH: 28.2 pg (ref 26.0–34.0)
MCHC: 31.7 g/dL (ref 30.0–36.0)
MCV: 89 fL (ref 80.0–100.0)
Platelets: 267 10*3/uL (ref 150–400)
RBC: 4.9 MIL/uL (ref 3.87–5.11)
RDW: 12.7 % (ref 11.5–15.5)
WBC: 13.9 10*3/uL — ABNORMAL HIGH (ref 4.0–10.5)
nRBC: 0 % (ref 0.0–0.2)

## 2022-03-30 LAB — TROPONIN I (HIGH SENSITIVITY): Troponin I (High Sensitivity): 64 ng/L — ABNORMAL HIGH (ref <18)

## 2022-03-30 MED ORDER — SODIUM CHLORIDE 0.9 % IV SOLN
1.0000 g | Freq: Once | INTRAVENOUS | Status: AC
  Administered 2022-03-31: 1 g via INTRAVENOUS
  Filled 2022-03-30: qty 1

## 2022-03-30 MED ORDER — ZOLPIDEM TARTRATE 5 MG PO TABS
10.0000 mg | ORAL_TABLET | Freq: Once | ORAL | Status: AC
  Administered 2022-03-30: 10 mg via ORAL
  Filled 2022-03-30: qty 2

## 2022-03-30 MED ORDER — SODIUM CHLORIDE 0.9 % IV SOLN
500.0000 mg | Freq: Once | INTRAVENOUS | Status: AC
  Administered 2022-03-30: 500 mg via INTRAVENOUS
  Filled 2022-03-30: qty 5

## 2022-03-30 MED ORDER — SODIUM CHLORIDE 0.9 % IV BOLUS
1000.0000 mL | Freq: Once | INTRAVENOUS | Status: AC
  Administered 2022-03-30: 1000 mL via INTRAVENOUS

## 2022-03-30 NOTE — ED Triage Notes (Signed)
Pt presents via EMS c/o weakness x1 week. Reports episode of weakness while ambulating requires spouse's assistance to sit down. Also report cough and congestion at home.  ?

## 2022-03-30 NOTE — ED Notes (Signed)
ED Provider at bedside. 

## 2022-03-30 NOTE — ED Provider Notes (Signed)
? ?[facility] ?Provider Note ? ? ? Event Date/Time  ? First MD Initiated Contact with Patient 03/30/22 2308   ?  (approximate) ? ? ?History  ? ?Weakness ? ? ?HPI ? ?History obtained via patient and her daughter ? ?Autumn Johnston is a 88 y.o. female brought to the ED via EMS from home with a chief complaint of generalized weakness x1 week.  Daughter states both parents have had cough and congestion with increasing weakness over the past week.  Daughter noted decreased oral intake this week as well.  Today patient was not able to ambulate with her walker due to generalized weakness.  Patient reports chills and nausea.  Patient denies fever, chest pain, shortness of breath, vomiting or diarrhea. ?  ? ? ?Past Medical History  ? ?Past Medical History:  ?Diagnosis Date  ? CKD (chronic kidney disease) stage 4, GFR 15-29 ml/min (HCC)   ? Colon cancer ([place])   ? Hypertension   ? ? ? ?Active Problem List  ? ?Patient Active Problem List  ? Diagnosis Date Noted  ? CAP (community acquired pneumonia) 03/31/2022  ? Pressure injury of skin 09/12/2021  ? Acute blood loss anemia   ? COVID-19 virus infection   ? Left lower quadrant abdominal mass   ? Hypotension   ? Pre-op evaluation   ? Stage 3b chronic kidney disease ([place])   ? Closed hip fracture requiring operative repair, left, sequela 09/07/2021  ? Fall at home, initial encounter 09/07/2021  ? Protein-calorie malnutrition, severe 07/31/2021  ? Nonsustained ventricular tachycardia ([place]) 07/30/2021  ? Hyponatremia 07/29/2021  ? Demand ischemia ([place])   ? Hypertensive urgency 07/28/2021  ? Protein calorie malnutrition ([place]) 07/28/2021  ? Essential hypertension 07/27/2021  ? Hyperlipidemia 07/27/2021  ? Pernicious anemia 07/27/2021  ? Renal failure (ARF), acute on chronic (HCC) 07/27/2021  ? Acute hyponatremia 07/27/2021  ? Elevated troponin 07/27/2021  ? Acute metabolic encephalopathy 16/60/6301  ? Chronic insomnia 04/05/2017  ? Chronic kidney disease  (CKD), stage IV (severe) ([place]) 12/25/2015  ? Anemia of chronic disease 07/20/2014  ? ? ? ?Past Surgical History  ? ?Past Surgical History:  ?Procedure Laterality Date  ? COLECTOMY WITH COLOSTOMY CREATION/HARTMANN PROCEDURE    ? INTRAMEDULLARY (IM) NAIL INTERTROCHANTERIC Left 09/08/2021  ? Procedure: INTRAMEDULLARY (IM) NAIL INTERTROCHANTRIC;  Surgeon: Leim Fabry, MD;  Location: [facility] ORS;  Service: Orthopedics;  Laterality: Left;  ? ? ? ?Home Medications  ? ?Prior to Admission medications   ?Medication Sig Start Date End Date Taking? Authorizing Provider  ?ALPRAZolam (XANAX) 0.25 MG tablet Take 1 tablet (0.25 mg total) by mouth 2 (two) times daily as needed. 08/01/21  Yes Sharen Hones, MD  ?sertraline (ZOLOFT) 50 MG tablet Take 50 mg by mouth daily. 05/24/21  Yes [provider]  ?simvastatin (ZOCOR) 20 MG tablet Take 20 mg by mouth at bedtime. 05/24/21  Yes [provider]  ?zolpidem (AMBIEN) 10 MG tablet Take 10 mg by mouth at bedtime as needed. 03/18/22  Yes [provider]  ?QUEtiapine (SEROQUEL) 25 MG tablet Take 0.5 tablets (12.5 mg total) by mouth at bedtime. ?Patient not taking: Reported on 03/31/2022 09/18/21   Nolberto Hanlon, MD  ? ? ? ?Allergies  ?Patient has no known allergies. ? ? ?Family History  ?No family history on file. ? ? ?Physical Exam  ?Triage Vital Signs: ?ED Triage Vitals  ?Enc Vitals Group  ?   BP 03/30/22 2040 (!) 187/92  ?   Pulse Rate 03/30/22 2040  99  ?   Resp 03/30/22 2040 (!) 22  ?   Temp 03/30/22 2040 98.5 ?F (36.9 ?C)  ?   Temp Source 03/30/22 2040 Oral  ?   SpO2 03/30/22 2040 (!) 89 %  ?   Weight --   ?   Height --   ?   Head Circumference --   ?   Peak Flow --   ?   Pain Score 03/30/22 2041 0  ?   Pain Loc --   ?   Pain Edu? --   ?   Excl. in [place]? --   ? ? ?Updated Vital Signs: ?BP (!) 157/77   Pulse 80   Temp 98.5 ?F (36.9 ?C) (Oral)   Resp 17   Ht '5\' 2"'$  (1.575 m)   Wt 50 kg   SpO2 96%   BMI 20.16 kg/m?  ? ? ?General: Awake, mild distress.  ?CV:  RRR.  Good  peripheral perfusion.  ?Resp:  Normal effort.  Scattered rhonchi.  Loose sounding cough. ?Abd:  Nontender.  No distention.  ?Other:  No calf swelling or tenderness ? ? ?ED Results / Procedures / Treatments  ?Labs ?(all labs ordered are listed, but only abnormal results are displayed) ?Labs Reviewed  ?BASIC METABOLIC PANEL - Abnormal; Notable for the following components:  ?    Result Value  ? Sodium 134 (*)   ? Chloride 96 (*)   ? Glucose, Bld 121 (*)   ? Creatinine, Ser 1.32 (*)   ? GFR, Estimated 37 (*)   ? All other components within normal limits  ?CBC - Abnormal; Notable for the following components:  ? WBC 13.9 (*)   ? All other components within normal limits  ?URINALYSIS, ROUTINE W REFLEX MICROSCOPIC - Abnormal; Notable for the following components:  ? Color, Urine STRAW (*)   ? APPearance CLEAR (*)   ? Hgb urine dipstick SMALL (*)   ? Protein, ur 100 (*)   ? All other components within normal limits  ?CBC WITH DIFFERENTIAL/PLATELET - Abnormal; Notable for the following components:  ? WBC 14.5 (*)   ? RBC 3.79 (*)   ? Hemoglobin 10.9 (*)   ? HCT 34.0 (*)   ? Neutro Abs 12.7 (*)   ? Abs Immature Granulocytes 0.08 (*)   ? All other components within normal limits  ?COMPREHENSIVE METABOLIC PANEL - Abnormal; Notable for the following components:  ? Sodium 134 (*)   ? Glucose, Bld 123 (*)   ? Creatinine, Ser 1.15 (*)   ? Calcium 8.0 (*)   ? Albumin 3.1 (*)   ? GFR, Estimated 43 (*)   ? All other components within normal limits  ?TROPONIN I (HIGH SENSITIVITY) - Abnormal; Notable for the following components:  ? Troponin I (High Sensitivity) 64 (*)   ? All other components within normal limits  ?TROPONIN I (HIGH SENSITIVITY) - Abnormal; Notable for the following components:  ? Troponin I (High Sensitivity) 103 (*)   ? All other components within normal limits  ?CULTURE, BLOOD (ROUTINE X 2)  ?CULTURE, BLOOD (ROUTINE X 2)  ?RESP PANEL BY RT-PCR (FLU A&B, COVID) [id number]  ?URINE CULTURE  ?LACTIC ACID, PLASMA  ?MAGNESIUM   ?PHOSPHORUS  ?STREP PNEUMONIAE URINARY ANTIGEN  ?LEGIONELLA PNEUMOPHILA SEROGP 1 UR AG  ?PROCALCITONIN  ? ? ? ?EKG ? ?ED ECG REPORT ?I, Paulette Blanch, the attending physician, personally viewed and interpreted this ECG. ? ? Date: 03/30/2022 ? EKG Time: 2046 ? Rate: 107 ?  Rhythm: sinus tachycardia ? Axis: Normal ? Intervals:none ? ST&T Change: Nonspecific ? ? ? ?RADIOLOGY ?I have independently visualized and interpreted patient's chest x-ray as well as noted the radiology interpretation: ? ?Chest x-ray: Right lower lobe pneumonia ? ?Official radiology report(s): ?DG Chest 2 View ? ?Result Date: 03/30/2022 ?CLINICAL DATA:  Cough and weakness. EXAM: CHEST - 2 VIEW COMPARISON:  09/07/2021 FINDINGS: Anteriorly to attic positioning with patient's chin partially obscuring the apices despite repeat acquisition. Right lower lobe airspace opacity with small right pleural effusion. Stable upper normal heart size. Unchanged mediastinal contours. Aortic atherosclerosis. No pulmonary edema. No pneumothorax. The bones are under mineralized. No acute osseous abnormalities are seen IMPRESSION: Right lower lobe airspace opacity with small right pleural effusion suspicious for pneumonia. Electronically Signed   By: Keith Rake M.D.   On: 03/30/2022 21:21   ? ? ?PROCEDURES: ? ?Critical Care performed: Yes, see critical care procedure note(s) ? ?CRITICAL CARE ?Performed by: Paulette Blanch ? ? ?Total critical care time: 30 minutes ? ?Critical care time was exclusive of separately billable procedures and treating other patients. ? ?Critical care was necessary to treat or prevent imminent or life-threatening deterioration. ? ?Critical care was time spent personally by me on the following activities: development of treatment plan with patient and/or surrogate as well as nursing, discussions with consultants, evaluation of patient's response to treatment, examination of patient, obtaining history from patient or surrogate, ordering and  performing treatments and interventions, ordering and review of laboratory studies, ordering and review of radiographic studies, pulse oximetry and re-evaluation of patient's condition. ? ? ?.1-3 Lead EKG Interpretat

## 2022-03-31 ENCOUNTER — Other Ambulatory Visit: Payer: Self-pay

## 2022-03-31 DIAGNOSIS — R54 Age-related physical debility: Secondary | ICD-10-CM | POA: Diagnosis present

## 2022-03-31 DIAGNOSIS — N1831 Chronic kidney disease, stage 3a: Secondary | ICD-10-CM | POA: Diagnosis present

## 2022-03-31 DIAGNOSIS — E860 Dehydration: Secondary | ICD-10-CM | POA: Diagnosis present

## 2022-03-31 DIAGNOSIS — Z85038 Personal history of other malignant neoplasm of large intestine: Secondary | ICD-10-CM | POA: Diagnosis not present

## 2022-03-31 DIAGNOSIS — J189 Pneumonia, unspecified organism: Secondary | ICD-10-CM | POA: Diagnosis present

## 2022-03-31 DIAGNOSIS — E871 Hypo-osmolality and hyponatremia: Secondary | ICD-10-CM | POA: Diagnosis present

## 2022-03-31 DIAGNOSIS — Z933 Colostomy status: Secondary | ICD-10-CM | POA: Diagnosis not present

## 2022-03-31 DIAGNOSIS — F419 Anxiety disorder, unspecified: Secondary | ICD-10-CM | POA: Diagnosis present

## 2022-03-31 DIAGNOSIS — F32A Depression, unspecified: Secondary | ICD-10-CM | POA: Diagnosis present

## 2022-03-31 DIAGNOSIS — N179 Acute kidney failure, unspecified: Secondary | ICD-10-CM | POA: Diagnosis present

## 2022-03-31 DIAGNOSIS — E861 Hypovolemia: Secondary | ICD-10-CM | POA: Diagnosis present

## 2022-03-31 DIAGNOSIS — Z20822 Contact with and (suspected) exposure to covid-19: Secondary | ICD-10-CM | POA: Diagnosis present

## 2022-03-31 DIAGNOSIS — E440 Moderate protein-calorie malnutrition: Secondary | ICD-10-CM | POA: Diagnosis present

## 2022-03-31 DIAGNOSIS — E785 Hyperlipidemia, unspecified: Secondary | ICD-10-CM | POA: Diagnosis present

## 2022-03-31 DIAGNOSIS — Z79899 Other long term (current) drug therapy: Secondary | ICD-10-CM | POA: Diagnosis not present

## 2022-03-31 DIAGNOSIS — R200 Anesthesia of skin: Secondary | ICD-10-CM | POA: Diagnosis present

## 2022-03-31 DIAGNOSIS — H9190 Unspecified hearing loss, unspecified ear: Secondary | ICD-10-CM | POA: Diagnosis present

## 2022-03-31 DIAGNOSIS — I129 Hypertensive chronic kidney disease with stage 1 through stage 4 chronic kidney disease, or unspecified chronic kidney disease: Secondary | ICD-10-CM | POA: Diagnosis present

## 2022-03-31 DIAGNOSIS — D631 Anemia in chronic kidney disease: Secondary | ICD-10-CM | POA: Diagnosis present

## 2022-03-31 DIAGNOSIS — Z6820 Body mass index (BMI) 20.0-20.9, adult: Secondary | ICD-10-CM | POA: Diagnosis not present

## 2022-03-31 LAB — PHOSPHORUS: Phosphorus: 4.1 mg/dL (ref 2.5–4.6)

## 2022-03-31 LAB — CBC WITH DIFFERENTIAL/PLATELET
Abs Immature Granulocytes: 0.08 10*3/uL — ABNORMAL HIGH (ref 0.00–0.07)
Basophils Absolute: 0 10*3/uL (ref 0.0–0.1)
Basophils Relative: 0 %
Eosinophils Absolute: 0 10*3/uL (ref 0.0–0.5)
Eosinophils Relative: 0 %
HCT: 34 % — ABNORMAL LOW (ref 36.0–46.0)
Hemoglobin: 10.9 g/dL — ABNORMAL LOW (ref 12.0–15.0)
Immature Granulocytes: 1 %
Lymphocytes Relative: 5 %
Lymphs Abs: 0.8 10*3/uL (ref 0.7–4.0)
MCH: 28.8 pg (ref 26.0–34.0)
MCHC: 32.1 g/dL (ref 30.0–36.0)
MCV: 89.7 fL (ref 80.0–100.0)
Monocytes Absolute: 1 10*3/uL (ref 0.1–1.0)
Monocytes Relative: 7 %
Neutro Abs: 12.7 10*3/uL — ABNORMAL HIGH (ref 1.7–7.7)
Neutrophils Relative %: 87 %
Platelets: 211 10*3/uL (ref 150–400)
RBC: 3.79 MIL/uL — ABNORMAL LOW (ref 3.87–5.11)
RDW: 12.8 % (ref 11.5–15.5)
WBC: 14.5 10*3/uL — ABNORMAL HIGH (ref 4.0–10.5)
nRBC: 0 % (ref 0.0–0.2)

## 2022-03-31 LAB — COMPREHENSIVE METABOLIC PANEL
ALT: 15 U/L (ref 0–44)
AST: 21 U/L (ref 15–41)
Albumin: 3.1 g/dL — ABNORMAL LOW (ref 3.5–5.0)
Alkaline Phosphatase: 57 U/L (ref 38–126)
Anion gap: 8 (ref 5–15)
BUN: 19 mg/dL (ref 8–23)
CO2: 25 mmol/L (ref 22–32)
Calcium: 8 mg/dL — ABNORMAL LOW (ref 8.9–10.3)
Chloride: 101 mmol/L (ref 98–111)
Creatinine, Ser: 1.15 mg/dL — ABNORMAL HIGH (ref 0.44–1.00)
GFR, Estimated: 43 mL/min — ABNORMAL LOW (ref >60)
Glucose, Bld: 123 mg/dL — ABNORMAL HIGH (ref 70–99)
Potassium: 4 mmol/L (ref 3.5–5.1)
Sodium: 134 mmol/L — ABNORMAL LOW (ref 135–145)
Total Bilirubin: 0.6 mg/dL (ref 0.3–1.2)
Total Protein: 6.9 g/dL (ref 6.5–8.1)

## 2022-03-31 LAB — LACTIC ACID, PLASMA: Lactic Acid, Venous: 1.7 mmol/L (ref 0.5–1.9)

## 2022-03-31 LAB — RESP PANEL BY RT-PCR (FLU A&B, COVID) ARPGX2
Influenza A by PCR: NEGATIVE
Influenza B by PCR: NEGATIVE
SARS Coronavirus 2 by RT PCR: NEGATIVE

## 2022-03-31 LAB — TROPONIN I (HIGH SENSITIVITY): Troponin I (High Sensitivity): 103 ng/L (ref <18)

## 2022-03-31 LAB — MAGNESIUM: Magnesium: 1.8 mg/dL (ref 1.7–2.4)

## 2022-03-31 LAB — PROCALCITONIN: Procalcitonin: 0.37 ng/mL

## 2022-03-31 LAB — STREP PNEUMONIAE URINARY ANTIGEN: Strep Pneumo Urinary Antigen: NEGATIVE

## 2022-03-31 MED ORDER — LACTATED RINGERS IV SOLN: INTRAVENOUS | Status: DC

## 2022-03-31 MED ORDER — AZITHROMYCIN 500 MG PO TABS
500.0000 mg | ORAL_TABLET | Freq: Every day | ORAL | Status: AC
  Administered 2022-03-31: 500 mg via ORAL
  Filled 2022-03-31: qty 1

## 2022-03-31 MED ORDER — GUAIFENESIN ER 600 MG PO TB12
600.0000 mg | ORAL_TABLET | Freq: Two times a day (BID) | ORAL | Status: DC
  Administered 2022-03-31 (×2): 600 mg via ORAL
  Filled 2022-03-31 (×3): qty 1

## 2022-03-31 MED ORDER — ENSURE ENLIVE PO LIQD
237.0000 mL | Freq: Two times a day (BID) | ORAL | Status: DC
  Administered 2022-03-31 – 2022-04-02 (×4): 237 mL via ORAL

## 2022-03-31 MED ORDER — ACETAMINOPHEN 325 MG PO TABS: 650.0000 mg | ORAL_TABLET | Freq: Four times a day (QID) | ORAL | Status: DC | PRN

## 2022-03-31 MED ORDER — POLYETHYLENE GLYCOL 3350 17 G PO PACK: 17.0000 g | PACK | Freq: Every day | ORAL | Status: DC | PRN

## 2022-03-31 MED ORDER — HYDROXYZINE HCL 50 MG PO TABS
25.0000 mg | ORAL_TABLET | Freq: Three times a day (TID) | ORAL | Status: DC | PRN
  Administered 2022-03-31: 25 mg via ORAL
  Filled 2022-03-31: qty 1

## 2022-03-31 MED ORDER — SERTRALINE HCL 50 MG PO TABS
50.0000 mg | ORAL_TABLET | Freq: Every day | ORAL | Status: DC
  Administered 2022-03-31 – 2022-04-02 (×3): 50 mg via ORAL
  Filled 2022-03-31 (×3): qty 1

## 2022-03-31 MED ORDER — NITROGLYCERIN 2 % TD OINT
0.5000 [in_us] | TOPICAL_OINTMENT | Freq: Once | TRANSDERMAL | Status: AC
  Administered 2022-03-31: 0.5 [in_us] via TOPICAL
  Filled 2022-03-31: qty 1

## 2022-03-31 MED ORDER — ZOLPIDEM TARTRATE 5 MG PO TABS
5.0000 mg | ORAL_TABLET | Freq: Every evening | ORAL | Status: DC | PRN
  Administered 2022-03-31 – 2022-04-01 (×2): 5 mg via ORAL
  Filled 2022-03-31 (×2): qty 1

## 2022-03-31 MED ORDER — ENOXAPARIN SODIUM 30 MG/0.3ML IJ SOSY
30.0000 mg | PREFILLED_SYRINGE | INTRAMUSCULAR | Status: DC
  Administered 2022-03-31 – 2022-04-02 (×3): 30 mg via SUBCUTANEOUS
  Filled 2022-03-31 (×3): qty 0.3

## 2022-03-31 MED ORDER — SIMVASTATIN 20 MG PO TABS
20.0000 mg | ORAL_TABLET | Freq: Every day | ORAL | Status: DC
  Administered 2022-03-31 – 2022-04-01 (×3): 20 mg via ORAL
  Filled 2022-03-31: qty 1
  Filled 2022-03-31: qty 2
  Filled 2022-03-31: qty 1

## 2022-03-31 MED ORDER — HYDRALAZINE HCL 20 MG/ML IJ SOLN
20.0000 mg | Freq: Four times a day (QID) | INTRAMUSCULAR | Status: DC | PRN
  Administered 2022-03-31: 20 mg via INTRAVENOUS
  Filled 2022-03-31: qty 1

## 2022-03-31 MED ORDER — AZITHROMYCIN 500 MG PO TABS
250.0000 mg | ORAL_TABLET | Freq: Every day | ORAL | Status: DC
  Administered 2022-04-01 – 2022-04-02 (×2): 250 mg via ORAL
  Filled 2022-03-31 (×2): qty 1

## 2022-03-31 MED ORDER — ASPIRIN 81 MG PO CHEW
324.0000 mg | CHEWABLE_TABLET | Freq: Once | ORAL | Status: AC
  Administered 2022-03-31: 324 mg via ORAL
  Filled 2022-03-31: qty 4

## 2022-03-31 MED ORDER — SODIUM CHLORIDE 0.9 % IV SOLN
1.0000 g | Freq: Once | INTRAVENOUS | Status: AC
  Administered 2022-03-31: 1 g via INTRAVENOUS
  Filled 2022-03-31: qty 10

## 2022-03-31 MED ORDER — SODIUM CHLORIDE 0.9 % IV SOLN
2.0000 g | INTRAVENOUS | Status: DC
  Administered 2022-04-01: 2 g via INTRAVENOUS
  Filled 2022-03-31: qty 2

## 2022-03-31 MED ORDER — ONDANSETRON HCL 4 MG/2ML IJ SOLN
4.0000 mg | Freq: Four times a day (QID) | INTRAMUSCULAR | Status: DC | PRN
  Administered 2022-03-31: 4 mg via INTRAVENOUS
  Filled 2022-03-31: qty 2

## 2022-03-31 MED ORDER — IPRATROPIUM-ALBUTEROL 0.5-2.5 (3) MG/3ML IN SOLN
3.0000 mL | Freq: Two times a day (BID) | RESPIRATORY_TRACT | Status: DC
  Administered 2022-03-31 – 2022-04-02 (×4): 3 mL via RESPIRATORY_TRACT
  Filled 2022-03-31 (×4): qty 3

## 2022-03-31 MED ORDER — GUAIFENESIN-DM 100-10 MG/5ML PO SYRP
5.0000 mL | ORAL_SOLUTION | ORAL | Status: DC | PRN
  Administered 2022-03-31: 5 mL via ORAL
  Filled 2022-03-31: qty 5

## 2022-03-31 NOTE — Progress Notes (Signed)
Anticoagulation monitoring(Lovenox): ? ?88 yo female ordered Lovenox 40 mg Q24h ?   ?Filed Weights  ? 03/31/22 0107  ?Weight: 50 kg (110 lb 3.7 oz)  ? ?BMI 20.16  ? ?Lab Results  ?Component Value Date  ? CREATININE 1.32 (H) 03/30/2022  ? CREATININE 1.15 (H) 09/16/2021  ? CREATININE 1.25 (H) 09/14/2021  ? ?Estimated Creatinine Clearance: 19.2 mL/min (A) (by C-G formula based on SCr of 1.32 mg/dL (H)). ?Hemoglobin & Hematocrit  ?   ?Component Value Date/Time  ? HGB 13.8 03/30/2022 2043  ? HGB 12.7 09/19/2012 0942  ? HCT 43.6 03/30/2022 2043  ? HCT 37.5 09/19/2012 0942  ? ? ? ?Per Protocol for Patient with estCrcl < 30 ml/min and BMI < 40, will transition to Lovenox 30 mg Q24h.  ?  ? ? ?

## 2022-03-31 NOTE — ED Notes (Signed)
Admitting provider at bedside.

## 2022-03-31 NOTE — Evaluation (Signed)
Physical Therapy Evaluation ?Patient Details ?Name: Autumn Johnston ?MRN: [id number] ?DOB: [birth year] ?Today's Date: 03/31/2022 ? ?History of Present Illness ? Patient is an 88 yo female that presented to ED for cough and weakness, workup showed community acquired PNA. PMH of HTN, HLD, CKD, ventral hernia, colostomy bag s/p colon cancer, L hip fracture. ?  ?Clinical Impression ? Patient alert, agreeable PT, but attempting to get a hold of husband with case management at bedside. Pt stated that she lives with her husband, and is modI/I for most ADLs, sink baths at baseline and family assist with household tasks and ADLs as needed. Denied any pain during the session. ? ?She performed supine to sit with CGA, able tosit with fair balance (elevated gurney). Sit <> stand twice with RW and CGA, and ambulated ~87f, and then additional 221fwith RW and CGA. Pt did tend to walk quickly, and reported some SOB and cough with task, no unsteadiness or physical assistance needed. Overall the patient demonstrated near return to baseline level of function for mobility, but would benefit from HHPT to maximize activity tolerance and endurance. Recommend intermittent supervision/assistance for safety.  ?   ? ?Recommendations for follow up therapy are one component of a multi-disciplinary discharge planning process, led by the attending physician.  Recommendations may be updated based on patient status, additional functional criteria and insurance authorization. ? ?Follow Up Recommendations Home health PT ? ?  ?Assistance Recommended at Discharge Intermittent Supervision/Assistance  ?Patient can return home with the following ? Assistance with cooking/housework;Assist for transportation;Help with stairs or ramp for entrance ? ?  ?Equipment Recommendations None recommended by PT  ?Recommendations for Other Services ?    ?  ?Functional Status Assessment Patient has had a recent decline in their functional status and demonstrates the  ability to make significant improvements in function in a reasonable and predictable amount of time.  ? ?  ?Precautions / Restrictions Precautions ?Precautions: Fall ?Precaution Comments: colostomy bag ?Restrictions ?Weight Bearing Restrictions: No  ? ?  ? ?Mobility ? Bed Mobility ?Overal bed mobility: Needs Assistance ?Bed Mobility: Supine to Sit, Sit to Supine ?  ?  ?Supine to sit: Min guard, HOB elevated ?Sit to supine: Min assist ?  ?General bed mobility comments: assist for LEs due elevated surface ?  ? ?Transfers ?Overall transfer level: Needs assistance ?Equipment used: Rolling walker (2 wheels) ?Transfers: Sit to/from Stand ?Sit to Stand: Min guard, From elevated surface ?  ?  ?  ?  ?  ?  ?  ? ?Ambulation/Gait ?  ?Gait Distance (Feet):  (23f67fand then additional 69f423fAssistive device: Rolling walker (2 wheels) ?  ?  ?  ?  ?General Gait Details: pt ambulates quickly, did express SOB and cough with mobility, HR and spO2 WFLs ? ?Stairs ?  ?  ?  ?  ?  ? ?Wheelchair Mobility ?  ? ?Modified Rankin (Stroke Patients Only) ?  ? ?  ? ?Balance Overall balance assessment: Needs assistance ?Sitting-balance support: Feet supported ?Sitting balance-Leahy Scale: Good ?  ?  ?  ?Standing balance-Leahy Scale: Fair ?  ?  ?  ?  ?  ?  ?  ?  ?  ?  ?  ?  ?   ? ? ? ?Pertinent Vitals/Pain Pain Assessment ?Pain Assessment: No/denies pain  ? ? ?Home Living Family/patient expects to be discharged to:: Private residence ?Living Arrangements: Spouse/significant other ?Available Help at Discharge: Family;Available 24 hours/day ?Type of Home: House ?Home Access: Level entry ?  ?  ?  Alternate Level Stairs-Number of Steps: 3-4 steps inside home from living room to kitchen/bedrooms with narrow B rails ?Home Layout: Two level ?Home Equipment: Conservation officer, nature (2 wheels);Cane - single point ?   ?  ?Prior Function Prior Level of Function : Needs assist ?  ?  ?  ?Physical Assist : ADLs (physical) ?  ?ADLs (physical): IADLs ?Mobility Comments: per  reported she is normally able to mobilize independently/modI ?ADLs Comments: sink bath at baseline that family is available to help if needed, but she tries to do as much as she can for her and her husband ?  ? ? ?Hand Dominance  ?   ? ?  ?Extremity/Trunk Assessment  ? Upper Extremity Assessment ?Upper Extremity Assessment: Generalized weakness ?  ? ?Lower Extremity Assessment ?Lower Extremity Assessment: Generalized weakness ?  ? ?Cervical / Trunk Assessment ?Cervical / Trunk Assessment: Kyphotic  ?Communication  ? Communication: HOH  ?Cognition Arousal/Alertness: Awake/alert ?Behavior During Therapy: [facility] for tasks assessed/performed ?Overall Cognitive Status: Within Functional Limits for tasks assessed ?  ?  ?  ?  ?  ?  ?  ?  ?  ?  ?  ?  ?  ?  ?  ?  ?  ?  ?  ? ?  ?General Comments   ? ?  ?Exercises    ? ?Assessment/Plan  ?  ?PT Assessment Patient needs continued PT services  ?PT Problem List Decreased mobility;Decreased activity tolerance;Decreased balance;Cardiopulmonary status limiting activity ? ?   ?  ?PT Treatment Interventions DME instruction;Therapeutic exercise;Gait training;Balance training;Stair training;Neuromuscular re-education;Functional mobility training;Therapeutic activities;Patient/family education   ? ?PT Goals (Current goals can be found in the Care Plan section)  ?Acute Rehab PT Goals ?Patient Stated Goal: to breathe better ?PT Goal Formulation: With patient ?Time For Goal Achievement: 04/14/22 ?Potential to Achieve Goals: Good ? ?  ?Frequency Min 2X/week ?  ? ? ?Co-evaluation   ?  ?  ?  ?  ? ? ?  ?AM-PAC PT "6 Clicks" Mobility  ?Outcome Measure Help needed turning from your back to your side while in a flat bed without using bedrails?: None ?Help needed moving from lying on your back to sitting on the side of a flat bed without using bedrails?: None ?Help needed moving to and from a bed to a chair (including a wheelchair)?: A Little ?Help needed standing up from a chair using your arms (e.g.,  wheelchair or bedside chair)?: A Little ?Help needed to walk in hospital room?: A Little ?Help needed climbing 3-5 steps with a railing? : A Little ?6 Click Score: 20 ? ?  ?End of Session Equipment Utilized During Treatment: Gait belt ?Activity Tolerance: Patient tolerated treatment well ?Patient left: in bed;with call bell/phone within reach ?Nurse Communication: Mobility status ?PT Visit Diagnosis: Other abnormalities of gait and mobility (R26.89);Muscle weakness (generalized) (M62.81) ?  ? ?Time: 7614-7092 ?PT Time Calculation (min) (ACUTE ONLY): 21 min ? ? ?Charges:   PT Evaluation ?$PT Eval Low Complexity: 1 Low ?PT Treatments ?$Therapeutic Activity: 8-22 mins ?  ?   ? ?Lieutenant Diego PT, DPT ?12:49 PM,03/31/22 ? ?

## 2022-03-31 NOTE — Progress Notes (Signed)
Patient resting in bed when transport came to move patient to room. Vitals stable and in no acute distress. Patient transported off of unit.  ?

## 2022-03-31 NOTE — ED Notes (Signed)
Pt given phone and dialed number to call husband ?

## 2022-03-31 NOTE — H&P (Signed)
?History and Physical ? ?Autumn Johnston GQQ:[id number] DOB: [birth year] DOA: 03/30/2022 ? ?Referring physician: Dr. Beather Arbour, [place]  ?PCP: Autumn Crouch, MD  ?Outpatient Specialists: None ?Patient coming from: Home ?Chief Complaint: Coughing, congestion, generalized weakness. ? ?HPI: Autumn Johnston is a 88 y.o. female with medical history significant for essential hypertension, hyperlipidemia, CKD 4, anemia of chronic disease, B12 deficiency, ventral hernia, colostomy bag, who presented to [facility] ED with complaints of coughing, chest congestion x1 week.  Associated with generalized weakness and decrease in oral intake.  Patient is unable to provide a full history due to mild confusion and hearing impairment, no family members at bedside.  Patient endorses chills and nausea with no vomiting.  She was brought into the ED from home via EMS.  Work-up in the ED revealed right lower lobe pneumonia seen on chest x-ray.  WBC of 13.9 K.  She was started on IV antibiotics empirically for community-acquired pneumonia.  TRH, hospitalist team, was asked to admit. ? ?ED Course: Tmax 98.5.  BP 141/79, pulse 80, respiratory 15, saturation 94% on room air.  Lab studies remarkable for serum sodium 134, glucose 121, creatinine 1.32 with baseline creatinine of 1.15.  WBC 13.9 thousand. ? ?Review of Systems: ?Review of systems as noted in the HPI. All other systems reviewed and are negative. ? ? ?Past Medical History:  ?Diagnosis Date  ? CKD (chronic kidney disease) stage 4, GFR 15-29 ml/min (HCC)   ? Colon cancer ([place])   ? Hypertension   ? ?Past Surgical History:  ?Procedure Laterality Date  ? COLECTOMY WITH COLOSTOMY CREATION/HARTMANN PROCEDURE    ? INTRAMEDULLARY (IM) NAIL INTERTROCHANTERIC Left 09/08/2021  ? Procedure: INTRAMEDULLARY (IM) NAIL INTERTROCHANTRIC;  Surgeon: Leim Fabry, MD;  Location: [facility] ORS;  Service: Orthopedics;  Laterality: Left;  ? ? ?Social History:  reports that she has never smoked. She has never used  smokeless tobacco. She reports that she does not drink alcohol and does not use drugs. ? ? ?No Known Allergies ? ?Family history: ?Mother no known medical problems. ?Father no known medical problems. ? ?Prior to Admission medications   ?Medication Sig Start Date End Date Taking? Authorizing Provider  ?ALPRAZolam (XANAX) 0.25 MG tablet Take 1 tablet (0.25 mg total) by mouth 2 (two) times daily as needed. 08/01/21  Yes Sharen Hones, MD  ?sertraline (ZOLOFT) 50 MG tablet Take 50 mg by mouth daily. 05/24/21  Yes [provider]  ?simvastatin (ZOCOR) 20 MG tablet Take 20 mg by mouth at bedtime. 05/24/21  Yes [provider]  ?zolpidem (AMBIEN) 10 MG tablet Take 10 mg by mouth at bedtime as needed. 03/18/22  Yes [provider]  ?QUEtiapine (SEROQUEL) 25 MG tablet Take 0.5 tablets (12.5 mg total) by mouth at bedtime. ?Patient not taking: Reported on 03/31/2022 09/18/21   Nolberto Hanlon, MD  ? ? ?Physical Exam: ?BP (!) 145/87   Pulse 91   Temp 98.5 ?F (36.9 ?C) (Oral)   Resp 17   Ht '5\' 2"'$  (1.575 m)   Wt 50 kg   SpO2 93%   BMI 20.16 kg/m?  ? ?General: 88 y.o. year-old female frail-appearing in no acute distress.  Alert and interactive.  Very hard of hearing. ?Cardiovascular: Regular rate and rhythm with no rubs or gallops.  No thyromegaly or JVD noted.  No lower extremity edema. 2/4 pulses in all 4 extremities. ?Respiratory: Diffuse rales bilaterally.  No wheezing noted.  Poor inspiratory effort. ?Abdomen: Soft nontender nondistended with normal bowel sounds x4 quadrants. ?Muskuloskeletal:  No cyanosis, clubbing or edema noted bilaterally ?Neuro: CN II-XII intact, strength, sensation, reflexes ?Skin: No ulcerative lesions noted or rashes ?Psychiatry: Mood is appropriate for condition and setting ?   ?   ?   ?Labs on Admission:  ?Basic Metabolic Panel: ?Recent Labs  ?Lab 03/30/22 ?2043  ?NA 134*  ?K 4.2  ?CL 96*  ?CO2 26  ?GLUCOSE 121*  ?BUN 20  ?CREATININE 1.32*  ?CALCIUM 9.2  ? ?Liver Function  Tests: ?No results for input(s): AST, ALT, ALKPHOS, BILITOT, PROT, ALBUMIN in the last 168 hours. ?No results for input(s): LIPASE, AMYLASE in the last 168 hours. ?No results for input(s): AMMONIA in the last 168 hours. ?CBC: ?Recent Labs  ?Lab 03/30/22 ?2043  ?WBC 13.9*  ?HGB 13.8  ?HCT 43.6  ?MCV 89.0  ?PLT 267  ? ?Cardiac Enzymes: ?No results for input(s): CKTOTAL, CKMB, CKMBINDEX, TROPONINI in the last 168 hours. ? ?BNP (last 3 results) ?Recent Labs  ?  07/27/21 ?2136  ?BNP 260.0*  ? ? ?ProBNP (last 3 results) ?No results for input(s): PROBNP in the last 8760 hours. ? ?CBG: ?No results for input(s): GLUCAP in the last 168 hours. ? ?Radiological Exams on Admission: ?DG Chest 2 View ? ?Result Date: 03/30/2022 ?CLINICAL DATA:  Cough and weakness. EXAM: CHEST - 2 VIEW COMPARISON:  09/07/2021 FINDINGS: Anteriorly to attic positioning with patient's chin partially obscuring the apices despite repeat acquisition. Right lower lobe airspace opacity with small right pleural effusion. Stable upper normal heart size. Unchanged mediastinal contours. Aortic atherosclerosis. No pulmonary edema. No pneumothorax. The bones are under mineralized. No acute osseous abnormalities are seen IMPRESSION: Right lower lobe airspace opacity with small right pleural effusion suspicious for pneumonia. Electronically Signed   By: Keith Rake M.D.   On: 03/30/2022 21:21   ? ?EKG: I independently viewed the EKG done and my findings are as followed: Sinus tachycardia rate of 107.  Nonspecific ST-T changes.  QTc 443. ? ?Assessment/Plan ?Present on Admission: ? CAP (community acquired pneumonia) ? ?Principal Problem: ?  CAP (community acquired pneumonia) ? ?Right lower lobe community-acquired pneumonia, POA ?Started on Rocephin and azithromycin in the ED, continue. ?Obtain strep pneumonia urine antigen, Legionella urine antigen ?Obtain baseline procalcitonin level in the morning ?Bronchodilators, pulmonary toilet ?Not hypoxic, continue to  maintain O2 saturation greater than 92%. ?Aspiration precautions ? ?Hypovolemic hyponatremia ?Serum sodium 134 ?Start gentle IV fluid hydration LR 50 cc/h x 1 day. ?Encourage oral intake ?Repeat chemistry panel in the morning ? ?Chronic anxiety/depression ?Resume home Zoloft ? ?Hyperlipidemia ?Resume home Zocor ? ?Generalized weakness/physical debility ?Ambulates with a walker at baseline ?PT OT to assess ?Fall precautions ? ?Moderate protein calorie malnutrition ?BMI 20 ?Moderate muscle mass loss ?Oral supplement Ensure ? ? ? ? ?DVT prophylaxis: Subcu Lovenox daily ? ?Code Status: Full code by default, unable to obtain CODE STATUS from the patient, no family member at bedside. ? ?Family Communication: None at bedside. ? ?Disposition Plan: Admitted to telemetry medical unit ? ?Consults called: None ? ?Admission status: Inpatient status. ? ? ?Status is: Inpatient ?Patient requires at least 2 midnights for further evaluation and treatment of present condition. ? ? ?Kayleen Memos MD ?[facility] ?Pager [phone] ? ?If 7PM-7AM, please contact night-coverage ?[email or web address] ?Password [email or web address] ? ?03/31/2022, 1:08 AM    ?

## 2022-03-31 NOTE — ED Notes (Addendum)
Troponin=103 c/o lab .Marland Kitchen ERMD aware  ?

## 2022-03-31 NOTE — Progress Notes (Signed)
Patient appearing very anxious and states she is so. MD made aware. Hydroxyzine given per MAR.  ?

## 2022-03-31 NOTE — Evaluation (Signed)
Occupational Therapy Evaluation ?Patient Details ?Name: Autumn Johnston ?MRN: [id number] ?DOB: 25-Feb-1922 ?Today's Date: 03/31/2022 ? ? ?History of Present Illness Patient is an 88 yo female that presented to ED for cough and weakness, workup showed community acquired PNA. PMH of HTN, HLD, CKD, ventral hernia, colostomy bag s/p colon cancer, L hip fracture.  ? ?Clinical Impression ?  ?[name] was seen for OT evaluation this date. Prior to hospital admission, pt was MOD I for mobility and ADLs using RW. Pt lives with husband in home c 3 STE. Pt presents to acute OT demonstrating impaired ADL performance and functional mobility 2/2 decreased activity tolerance and functional strength/ROM/balance deficits. Pt currently requires SUPERVISION bed mobility. CGA + RW sit<>stand x2 at EOB, tolerates ~3 ft prior to stating "I need to sit down" and returning quickly to bed - pt does not report dizziness however plan to check orthostatics next session.  ? ?MIN A brief mgmt in standing - pt fatigues quickly and requires BUE support static standing. Pt would benefit from skilled OT to address noted impairments and functional limitations (see below for any additional details). Upon hospital discharge, recommend [place] with initial 24/7 assistance - of note pt's daughter reports pt's husband currently as ED. If pt unable to have assistance at home pt will qualify for STR.   ? ?Recommendations for follow up therapy are one component of a multi-disciplinary discharge planning process, led by the attending physician.  Recommendations may be updated based on patient status, additional functional criteria and insurance authorization.  ? ?Follow Up Recommendations ? Home health OT  ?  ?Assistance Recommended at Discharge Frequent or constant Supervision/Assistance (initial 24/7 assist)  ?Patient can return home with the following A little help with walking and/or transfers;A little help with bathing/dressing/bathroom;Help with stairs  or ramp for entrance ? ?  ?Functional Status Assessment ? Patient has had a recent decline in their functional status and demonstrates the ability to make significant improvements in function in a reasonable and predictable amount of time.  ?Equipment Recommendations ? BSC/3in1  ?  ?Recommendations for Other Services   ? ? ?  ?Precautions / Restrictions Precautions ?Precautions: Fall ?Precaution Comments: colostomy bag ?Restrictions ?Weight Bearing Restrictions: No  ? ?  ? ?Mobility Bed Mobility ?Overal bed mobility: Needs Assistance ?Bed Mobility: Supine to Sit, Sit to Supine ?  ?  ?Supine to sit: Supervision, HOB elevated ?Sit to supine: Min guard, HOB elevated ?  ?  ?  ? ?Transfers ?Overall transfer level: Needs assistance ?Equipment used: Rolling walker (2 wheels) ?Transfers: Sit to/from Stand ?Sit to Stand: Min guard, From elevated surface ?  ?  ?  ?  ?  ?General transfer comment: elevated stretcher height ?  ? ?  ?Balance Overall balance assessment: Needs assistance ?Sitting-balance support: Feet supported ?Sitting balance-Leahy Scale: Good ?  ?  ?Standing balance support: Single extremity supported, During functional activity ?Standing balance-Leahy Scale: Fair ?  ?  ?  ?  ?  ?  ?  ?  ?  ?  ?  ?  ?   ? ?ADL either performed or assessed with clinical judgement  ? ?ADL Overall ADL's : Needs assistance/impaired ?  ?  ?  ?  ?  ?  ?  ?  ?  ?  ?  ?  ?  ?  ?  ?  ?  ?  ?  ?General ADL Comments: CGA + RW for ADL t/f. MIN A brief mgmt in standing - pt  fatigues quickly and requires BUE support static standing.  ? ? ? ? ?Pertinent Vitals/Pain Pain Assessment ?Pain Assessment: No/denies pain  ? ? ? ?Hand Dominance   ?  ?Extremity/Trunk Assessment Upper Extremity Assessment ?Upper Extremity Assessment: Generalized weakness ?  ?Lower Extremity Assessment ?Lower Extremity Assessment: Generalized weakness ?  ?Cervical / Trunk Assessment ?Cervical / Trunk Assessment: Kyphotic ?  ?Communication Communication ?Communication:  HOH ?  ?Cognition Arousal/Alertness: Awake/alert ?Behavior During Therapy: [facility] for tasks assessed/performed, Anxious ?Overall Cognitive Status: Within Functional Limits for tasks assessed ?  ?  ?  ?  ?  ?  ?  ?  ?  ?  ?  ?  ?  ?  ?  ?  ?  ?  ?  ?   ?   ?   ? ? ?Home Living Family/patient expects to be discharged to:: Private residence ?Living Arrangements: Spouse/significant other ?Available Help at Discharge: Family;Available 24 hours/day ?Type of Home: House ?Home Access: Level entry ?  ?  ?Home Layout: Two level ?Alternate Level Stairs-Number of Steps: 3-4 steps inside home from living room to kitchen/bedrooms with narrow B rails ?  ?  ?  ?  ?  ?  ?Home Equipment: Conservation officer, nature (2 wheels);Cane - single point ?  ?  ?  ? ?  ?Prior Functioning/Environment Prior Level of Function : Needs assist ?  ?  ?  ?Physical Assist : ADLs (physical) ?  ?ADLs (physical): IADLs ?Mobility Comments: per reported she is normally able to mobilize independently/modI ?ADLs Comments: sink bath at baseline that family is available to help if needed, but she tries to do as much as she can for her and her husband ?  ? ?  ?  ?OT Problem List: Decreased strength;Decreased range of motion;Decreased activity tolerance;Impaired balance (sitting and/or standing);Decreased safety awareness ?  ?   ?OT Treatment/Interventions: Self-care/ADL training;Therapeutic exercise;Energy conservation;DME and/or AE instruction;Patient/family education;Balance training;Therapeutic activities  ?  ?OT Goals(Current goals can be found in the care plan section) Acute Rehab OT Goals ?Patient Stated Goal: to breathe better ?OT Goal Formulation: With patient/family ?Time For Goal Achievement: 04/14/22 ?Potential to Achieve Goals: Good ?ADL Goals ?Pt Will Perform Grooming: with supervision;standing (tolerate >8 mins) ?Pt Will Perform Lower Body Dressing: with modified independence;sit to/from stand ?Pt Will Transfer to Toilet: with modified  independence;ambulating;regular height toilet  ?OT Frequency: Min 3X/week ?  ? ?Co-evaluation   ?  ?  ?  ?  ? ?  ?AM-PAC OT "6 Clicks" Daily Activity     ?Outcome Measure Help from another person eating meals?: None ?Help from another person taking care of personal grooming?: A Little ?Help from another person toileting, which includes using toliet, bedpan, or urinal?: A Little ?Help from another person bathing (including washing, rinsing, drying)?: A Little ?Help from another person to put on and taking off regular upper body clothing?: None ?Help from another person to put on and taking off regular lower body clothing?: A Little ?6 Click Score: 20 ?  ?End of Session Equipment Utilized During Treatment: Rolling walker (2 wheels) ? ?Activity Tolerance: Patient tolerated treatment well ?Patient left: in bed;with call bell/phone within reach;with family/visitor present ? ?OT Visit Diagnosis: Other abnormalities of gait and mobility (R26.89);Muscle weakness (generalized) (M62.81)  ?              ?Time: 3664-4034 ?OT Time Calculation (min): 16 min ?Charges:  OT General Charges ?$OT Visit: 1 Visit ?OT Evaluation ?$OT Eval Moderate Complexity: 1 Mod ? ?Elva Breaker  Spero Curb, M.S. OTR/L  ?03/31/22, 2:37 PM  ?ascom [phone] ? ?

## 2022-03-31 NOTE — Progress Notes (Signed)
?PROGRESS NOTE ? ? ?HPI was taken from Dr. Nevada Crane: ?Autumn Johnston is a 88 y.o. female with medical history significant for essential hypertension, hyperlipidemia, CKD 4, anemia of chronic disease, B12 deficiency, ventral hernia, colostomy bag, who presented to [facility] ED with complaints of coughing, chest congestion x1 week.  Associated with generalized weakness and decrease in oral intake.  Patient is unable to provide a full history due to mild confusion and hearing impairment, no family members at bedside.  Patient endorses chills and nausea with no vomiting.  She was brought into the ED from home via EMS.  Work-up in the ED revealed right lower lobe pneumonia seen on chest x-ray.  WBC of 13.9 K.  She was started on IV antibiotics empirically for community-acquired pneumonia.  TRH, hospitalist team, was asked to admit. ?  ?ED Course: Tmax 98.5.  BP 141/79, pulse 80, respiratory 15, saturation 94% on room air.  Lab studies remarkable for serum sodium 134, glucose 121, creatinine 1.32 with baseline creatinine of 1.15.  WBC 13.9 thousand. ? ? ?Loys Hoselton  WVP:[id number] DOB: 1922-09-20 DOA: 03/30/2022 ?PCP: Idelle Crouch, MD  ? ?Assessment & Plan: ?  ?Principal Problem: ?  CAP (community acquired pneumonia) ? ?Right lower lobe community-acquired pneumonia: present on admission. Continue on IV rocephin, azithromycin, bronchodilators, mucinex & encourage incentive spirometry. Strep is neg and legionella is pending  ? ?Leukocytosis: likely secondary to above infection. Continue on abxs ? ?Likely CKD: baseline Cr/GFR is unknown, currently stage IIIa. Avoid nephrotoxic meds ?  ?Hypovolemic hyponatremia: stable from day prior, almost WNL  ?  ?Chronic depression: severity unknown. Continue on home dose of zoloft  ?  ?HLD: continue on statin ?  ?Generalized weakness: OT/PT consulted  ?  ?Moderate protein calorie malnutrition: BMI 20. Continue on nutritional supplements  ? ? ? ? ?DVT prophylaxis: lovenox  ?Code  Status:  full  ?Family Communication: discussed pt's care w/ pt's daughter, Patrici Ranks, and answered her questions  ?Disposition Plan: depends on PT/OT recs  ? ?Level of care: Telemetry Medical ? ?Status is: Inpatient ?Remains inpatient appropriate because: requiring on IV abxs ? ? ? ?Consultants:  ? ? ?Procedures:  ? ?Antimicrobials: rocephin, azithromycin  ? ? ?Subjective: ?Pt c/o mucus that she cant get cough off ? ?Objective: ?Vitals:  ? 03/31/22 0107 03/31/22 0200 03/31/22 0300 03/31/22 0500  ?BP:  (!) 141/79 (!) 157/77 (!) 151/75  ?Pulse:  80 80 66  ?Resp:  '15 17 17  '$ ?Temp:      ?TempSrc:      ?SpO2:  94% 96% 96%  ?Weight: 50 kg     ?Height: '5\' 2"'$  (1.575 m)     ? ? ?Intake/Output Summary (Last 24 hours) at 03/31/2022 0659 ?Last data filed at 03/31/2022 0216 ?Gross per 24 hour  ?Intake 1450 ml  ?Output --  ?Net 1450 ml  ? ?Filed Weights  ? 03/31/22 0107  ?Weight: 50 kg  ? ? ?Examination: ? ?General exam: Appears calm but uncomfortable. Frail appearing ?Respiratory system: course breath sounds b/l  ?Cardiovascular system: S1 & S2 +. No rubs, gallops or clicks.  ?Gastrointestinal system: Abdomen is nondistended, soft and nontender. Normal bowel sounds heard. ?Central nervous system: Alert and awake. Moves all extremities  ?Psychiatry: Judgement and insight appear at baseline. Flat mood and affect   ? ? ? ?Data Reviewed: I have personally reviewed following labs and imaging studies ? ?CBC: ?Recent Labs  ?Lab 03/30/22 ?2043 03/31/22 ?0337  ?WBC 13.9* 14.5*  ?NEUTROABS  --  12.7*  ?HGB 13.8 10.9*  ?HCT 43.6 34.0*  ?MCV 89.0 89.7  ?PLT 267 211  ? ?Basic Metabolic Panel: ?Recent Labs  ?Lab 03/30/22 ?2043 03/31/22 ?0337  ?NA 134* 134*  ?K 4.2 4.0  ?CL 96* 101  ?CO2 26 25  ?GLUCOSE 121* 123*  ?BUN 20 19  ?CREATININE 1.32* 1.15*  ?CALCIUM 9.2 8.0*  ?MG  --  1.8  ?PHOS  --  4.1  ? ?GFR: ?Estimated Creatinine Clearance: 22.1 mL/min (A) (by C-G formula based on SCr of 1.15 mg/dL (H)). ?Liver Function Tests: ?Recent Labs  ?Lab  03/31/22 ?0337  ?AST 21  ?ALT 15  ?ALKPHOS 57  ?BILITOT 0.6  ?PROT 6.9  ?ALBUMIN 3.1*  ? ?No results for input(s): LIPASE, AMYLASE in the last 168 hours. ?No results for input(s): AMMONIA in the last 168 hours. ?Coagulation Profile: ?No results for input(s): INR, PROTIME in the last 168 hours. ?Cardiac Enzymes: ?No results for input(s): CKTOTAL, CKMB, CKMBINDEX, TROPONINI in the last 168 hours. ?BNP (last 3 results) ?No results for input(s): PROBNP in the last 8760 hours. ?HbA1C: ?No results for input(s): HGBA1C in the last 72 hours. ?CBG: ?No results for input(s): GLUCAP in the last 168 hours. ?Lipid Profile: ?No results for input(s): CHOL, HDL, LDLCALC, TRIG, CHOLHDL, LDLDIRECT in the last 72 hours. ?Thyroid Function Tests: ?No results for input(s): TSH, T4TOTAL, FREET4, T3FREE, THYROIDAB in the last 72 hours. ?Anemia Panel: ?No results for input(s): VITAMINB12, FOLATE, FERRITIN, TIBC, IRON, RETICCTPCT in the last 72 hours. ?Sepsis Labs: ?Recent Labs  ?Lab 03/30/22 ?2316 03/31/22 ?0337  ?PROCALCITON  --  0.37  ?LATICACIDVEN 1.7  --   ? ? ?Recent Results (from the past 240 hour(s))  ?Culture, blood (routine x 2)     Status: None (Preliminary result)  ? Collection Time: 03/30/22 11:16 PM  ? Specimen: BLOOD  ?Result Value Ref Range Status  ? Specimen Description BLOOD RIGHT ASSIST CONTROL  Final  ? Special Requests   Final  ?  BOTTLES DRAWN AEROBIC AND ANAEROBIC Blood Culture results may not be optimal due to an inadequate volume of blood received in culture bottles  ? Culture   Final  ?  NO GROWTH < 12 HOURS ?Performed at [facility], [address] [place], [place] [address] ?  ? Report Status PENDING  Incomplete  ?Culture, blood (routine x 2)     Status: None (Preliminary result)  ? Collection Time: 03/30/22 11:16 PM  ? Specimen: BLOOD  ?Result Value Ref Range Status  ? Specimen Description BLOOD RIGHT ARM  Final  ? Special Requests   Final  ?  BOTTLES DRAWN AEROBIC AND ANAEROBIC Blood Culture results  may not be optimal due to an inadequate volume of blood received in culture bottles  ? Culture   Final  ?  NO GROWTH < 12 HOURS ?Performed at [facility], [address], [place], [place] [address] ?  ? Report Status PENDING  Incomplete  ?Resp Panel by RT-PCR (Flu A&B, Covid) Nasopharyngeal Swab     Status: None  ? Collection Time: 03/30/22 11:16 PM  ? Specimen: Nasopharyngeal Swab; Nasopharyngeal(NP) swabs in vial transport medium  ?Result Value Ref Range Status  ? SARS Coronavirus 2 by RT PCR NEGATIVE NEGATIVE Final  ?  Comment: (NOTE) ?SARS-CoV-2 target nucleic acids are NOT DETECTED. ? ?The SARS-CoV-2 RNA is generally detectable in upper respiratory ?specimens during the acute phase of infection. The lowest ?concentration of SARS-CoV-2 viral copies this assay can detect is ?[place]  copies/mL. A negative result does not preclude SARS-Cov-2 ?infection and should not be used as the sole basis for treatment or ?other patient management decisions. A negative result may occur with  ?improper specimen collection/handling, submission of specimen other ?than nasopharyngeal swab, presence of viral mutation(s) within the ?areas targeted by this assay, and inadequate number of viral ?copies(<138 copies/mL). A negative result must be combined with ?clinical observations, patient history, and epidemiological ?information. The expected result is Negative. ? ?Fact Sheet for Patients:  ?EntrepreneurPulse.com.au ? ?Fact Sheet for Healthcare Providers:  ?IncredibleEmployment.be ? ?This test is no t yet approved or cleared by the [place] FDA and  ?has been authorized for detection and/or diagnosis of SARS-CoV-2 by ?FDA under an Emergency Use Authorization (EUA). This EUA will remain  ?in effect (meaning this test can be used) for the duration of the ?COVID-19 declaration under Section 564(b)(1) of the Act, 21 ?U.S.C.section 360bbb-3(b)(1), unless the authorization is terminated  ?or  revoked sooner.  ? ? ?  ? Influenza A by PCR NEGATIVE NEGATIVE Final  ? Influenza B by PCR NEGATIVE NEGATIVE Final  ?  Comment: (NOTE) ?The Xpert Xpress SARS-CoV-2/FLU/RSV plus assay is intended as an aid

## 2022-03-31 NOTE — ED Notes (Signed)
Transport Requested  ?

## 2022-03-31 NOTE — Progress Notes (Signed)
?   03/31/22 2019  ?Assess: MEWS Score  ?Temp 99.7 ?F (37.6 ?C)  ?BP (!) 161/65  ?Pulse Rate (!) 101  ?Resp (!) 34  ?SpO2 97 %  ?O2 Device Nasal Cannula  ?Assess: MEWS Score  ?MEWS Temp 0  ?MEWS Systolic 0  ?MEWS Pulse 1  ?MEWS RR 2  ?MEWS LOC 0  ?MEWS Score 3  ?MEWS Score Color Yellow  ?Assess: if the MEWS score is Yellow or Red  ?Were vital signs taken at a resting state? Yes  ?Focused Assessment No change from prior assessment  ?Does the patient meet 2 or more of the SIRS criteria? Yes  ?Does the patient have a confirmed or suspected source of infection? Yes  ?Provider and Rapid Response Notified? No  ?MEWS guidelines implemented *See Row Information* Yes  ?Treat  ?MEWS Interventions Other (Comment) ?(repositioned, warm blanket applied)  ?Pain Scale 0-10  ?Pain Score 0  ?Take Vital Signs  ?Increase Vital Sign Frequency  Yellow: Q 2hr X 2 then Q 4hr X 2, if remains yellow, continue Q 4hrs  ?Escalate  ?MEWS: Escalate Yellow: discuss with charge nurse/RN and consider discussing with provider and RRT  ?Notify: Charge Nurse/RN  ?Name of Charge Nurse/RN Notified [place], RN  ?Date Charge Nurse/RN Notified 03/31/22  ?Time Charge Nurse/RN Notified 2022  ?Document  ?Patient Outcome Stabilized after interventions  ?Progress note created (see row info) Yes  ?Assess: SIRS CRITERIA  ?SIRS Temperature  0  ?SIRS Pulse 1  ?SIRS Respirations  1  ?SIRS WBC 0  ?SIRS Score Sum  2  ? ?Patient arrived to unit uncomfortable and cold. Vitals showed yellow MEWS for RR of  34 and HR 101. Patient was then repositioned and warm blankets applied. When rechecked 2 hours later, RR down to 24 and HR 87. Patient sleeping comfortably in bed.  ?

## 2022-03-31 NOTE — TOC Initial Note (Signed)
Transition of Care (TOC) - Initial/Assessment Note  ? ? ?Patient Details  ?Name: Autumn Johnston ?MRN: [id number] ?Date of Birth: 28-Mar-1922 ? ?Transition of Care (TOC) CM/SW Contact:    ?Shelbie Hutching, RN ?Phone Number: ?03/31/2022, 11:34 AM ? ?Clinical Narrative:                 ?Patient admitted to the hospital with Community Acquired pneumonia.  RNCM met with patient at the beside, introduced self and explained role in discharge planning.  Patient is trying to call her husband at home and is really concerned about reaching him.  RNCM assisted patient with calling but there was no answer and we tried 3 times. ?Patient lives with her husband he is 64 to, they have been married for 40 years.  She says they don't drive they are too old to drive.  Patient's daughter, Patrici Ranks, lives a couple miles from them and provides transportation and comes over to help with cooking and cleaning.   ?Patient is independent in ADL's and walks with a walker most of the time, she also has a cane..  She has had home health in the past but not currently, she reports using [facility] in the past.  RNCM reached out to [place] with [facility] and he accepted referral for RN, PT, and OT.  PCP is Dr. Doy Hutching and she uses [facility].   ? ?TOC will cont to follow.   ? ? ?Expected Discharge Plan: [place] ?Barriers to Discharge: Continued Medical Work up ? ? ?Patient Goals and CMS Choice ?Patient states their goals for this hospitalization and ongoing recovery are:: Patient wants to speak with her husband ?CMS Medicare.gov Compare Post Acute Care list provided to:: Patient ?Choice offered to / list presented to : Patient ? ?Expected Discharge Plan and Services ?Expected Discharge Plan: [place] ?  ?Discharge Planning Services: CM Consult ?Post Acute Care Choice: Home Health ?Living arrangements for the past 2 months: [place] ?                ?DME Arranged: N/A ?DME Agency: NA ?   ?  ?  ?HH Arranged: RN, PT, OT ?[place] Agency: [place] ([place]) ?Date HH Agency Contacted: 03/31/22 ?Time [place]: 1133 ?Representative spoke with at [place]: Corene Cornea ? ?Prior Living Arrangements/Services ?Living arrangements for the past 2 months: [place] ?Lives with:: Spouse ?Patient language and need for interpreter reviewed:: Yes ?Do you feel safe going back to the place where you live?: Yes      ?Need for Family Participation in Patient Care: Yes (Comment) ?Care giver support system in place?: Yes (comment) (husband and daughter) ?Current home services: DME (cane) ?Criminal Activity/Legal Involvement Pertinent to Current Situation/Hospitalization: No - Comment as needed ? ?Activities of Daily Living ?  ?  ? ?Permission Sought/Granted ?Permission sought to share information with : Case Manager, Family Supports, Other (comment) ?Permission granted to share information with : Yes, Verbal Permission Granted ? Share Information with NAME: [name] ? Permission granted to share info w AGENCY: home health agency ? Permission granted to share info w Relationship: daughter ? Permission granted to share info w Contact Information: [phone] ? ?Emotional Assessment ?Appearance:: Appears stated age ?Attitude/Demeanor/Rapport: Engaged ?Affect (typically observed): Accepting ?Orientation: : Oriented to Self, Oriented to Place, Oriented to  Time, Oriented to Situation ?Alcohol / Substance Use: Not Applicable ?Psych Involvement: No (comment) ? ?Admission diagnosis:  CAP (community acquired pneumonia) [J18.9] ?Patient Active Problem List  ? Diagnosis Date Noted  ? CAP (community acquired pneumonia) 03/31/2022  ? Pressure injury of skin 09/12/2021  ? Acute blood loss anemia   ? COVID-19 virus infection   ? Left lower quadrant abdominal mass   ? Hypotension   ? Pre-op evaluation   ? Stage 3b chronic kidney disease ([place])   ? Closed hip fracture requiring operative repair, left, sequela  09/07/2021  ? Fall at home, initial encounter 09/07/2021  ? Protein-calorie malnutrition, severe 07/31/2021  ? Nonsustained ventricular tachycardia ([place]) 07/30/2021  ? Hyponatremia 07/29/2021  ? Demand ischemia ([place])   ? Hypertensive urgency 07/28/2021  ? Protein calorie malnutrition ([place]) 07/28/2021  ? Essential hypertension 07/27/2021  ? Hyperlipidemia 07/27/2021  ? Pernicious anemia 07/27/2021  ? Renal failure (ARF), acute on chronic (HCC) 07/27/2021  ? Acute hyponatremia 07/27/2021  ? Elevated troponin 07/27/2021  ? Acute metabolic encephalopathy 54/08/8118  ? Chronic insomnia 04/05/2017  ? Chronic kidney disease (CKD), stage IV (severe) ([place]) 12/25/2015  ? Anemia of chronic disease 07/20/2014  ? ?PCP:  Idelle Crouch, MD ?Pharmacy:   ?[id number] [place] [facility] [address] ?[address] ?[id number]?Phone: [phone]: [phone]? ? ? ? ?Social Determinants of Health (SDOH) Interventions ?  ? ?Readmission Risk Interventions ? ?  09/17/2021  ?  9:37 AM  ?Readmission Risk Prevention Plan  ?Transportation Screening Complete  ?Medication Review (Press photographer Complete  ?PCP or Specialist appointment within 3-5 days of discharge Complete  ?[place] [facility] Consult Complete  ?SW Recovery Care/Counseling Consult Not Complete  ?SW Consult Not Complete Comments RNCM assigned to case  ?Palliative Care Screening Not Applicable  ?Comments na  ?Skilled Nursing Facility Complete  ? ? ? ?

## 2022-03-31 NOTE — ED Notes (Signed)
OT at bedside. 

## 2022-04-01 ENCOUNTER — Encounter: Payer: Self-pay | Admitting: Internal Medicine

## 2022-04-01 DIAGNOSIS — J189 Pneumonia, unspecified organism: Secondary | ICD-10-CM | POA: Diagnosis not present

## 2022-04-01 LAB — CBC
HCT: 30.6 % — ABNORMAL LOW (ref 36.0–46.0)
Hemoglobin: 10 g/dL — ABNORMAL LOW (ref 12.0–15.0)
MCH: 28.9 pg (ref 26.0–34.0)
MCHC: 32.7 g/dL (ref 30.0–36.0)
MCV: 88.4 fL (ref 80.0–100.0)
Platelets: 202 10*3/uL (ref 150–400)
RBC: 3.46 MIL/uL — ABNORMAL LOW (ref 3.87–5.11)
RDW: 13 % (ref 11.5–15.5)
WBC: 11 10*3/uL — ABNORMAL HIGH (ref 4.0–10.5)
nRBC: 0 % (ref 0.0–0.2)

## 2022-04-01 LAB — BASIC METABOLIC PANEL
Anion gap: 8 (ref 5–15)
BUN: 29 mg/dL — ABNORMAL HIGH (ref 8–23)
CO2: 25 mmol/L (ref 22–32)
Calcium: 8.2 mg/dL — ABNORMAL LOW (ref 8.9–10.3)
Chloride: 99 mmol/L (ref 98–111)
Creatinine, Ser: 1.29 mg/dL — ABNORMAL HIGH (ref 0.44–1.00)
GFR, Estimated: 38 mL/min — ABNORMAL LOW (ref >60)
Glucose, Bld: 104 mg/dL — ABNORMAL HIGH (ref 70–99)
Potassium: 4.1 mmol/L (ref 3.5–5.1)
Sodium: 132 mmol/L — ABNORMAL LOW (ref 135–145)

## 2022-04-01 LAB — URINE CULTURE

## 2022-04-01 MED ORDER — DM-GUAIFENESIN ER 30-600 MG PO TB12
1.0000 | ORAL_TABLET | Freq: Two times a day (BID) | ORAL | Status: DC
  Administered 2022-04-01 – 2022-04-02 (×2): 1 via ORAL
  Filled 2022-04-01 (×2): qty 1

## 2022-04-01 MED ORDER — ONDANSETRON HCL 4 MG/2ML IJ SOLN: 4.0000 mg | Freq: Four times a day (QID) | INTRAMUSCULAR | Status: DC | PRN

## 2022-04-01 MED ORDER — GUAIFENESIN 100 MG/5ML PO LIQD
5.0000 mL | Freq: Four times a day (QID) | ORAL | Status: DC
  Filled 2022-04-01: qty 10

## 2022-04-01 MED ORDER — GUAIFENESIN 100 MG/5ML PO LIQD: 5.0000 mL | ORAL | Status: DC | PRN

## 2022-04-01 MED ORDER — AMOXICILLIN-POT CLAVULANATE 500-125 MG PO TABS
500.0000 mg | ORAL_TABLET | Freq: Two times a day (BID) | ORAL | Status: DC
  Administered 2022-04-01 – 2022-04-02 (×2): 500 mg via ORAL
  Filled 2022-04-01 (×2): qty 1

## 2022-04-01 MED ORDER — ADULT MULTIVITAMIN W/MINERALS CH
1.0000 | ORAL_TABLET | Freq: Every day | ORAL | Status: DC
  Administered 2022-04-01 – 2022-04-02 (×2): 1 via ORAL
  Filled 2022-04-01 (×2): qty 1

## 2022-04-01 NOTE — Progress Notes (Signed)
[place] at [facility] ? ? ?PATIENT NAME: Autumn Johnston   ? ?MR#:  [id number] ? ?DATE OF BIRTH:  07-19-1922 ? ?SUBJECTIVE:  ?came in with increasing cough weakness and numbness in her feet. Daughter Autumn Johnston at bedside. Patient says she feels a bit better. She is the peak eater at home per daughter. Has some congestion. Mucinex made her sick. She is agreeable to take Robitussin. No fever. Sats more than 92% on room air. ? ? ? ?VITALS:  ?Blood pressure 133/61, pulse 85, temperature 98.7 ?F (37.1 ?C), resp. rate 16, height '5\' 2"'$  (1.575 m), weight 50 kg, SpO2 95 %. ? ?PHYSICAL EXAMINATION:  ? ?GENERAL:  88 y.o.-year-old patient lying in the bed with no acute distress.  ?LUNGS: Normal breath sounds bilaterally, no wheezing, rales, rhonchi.  ?CARDIOVASCULAR: S1, S2 normal. No murmurs, rubs, or gallops.  ?ABDOMEN: Soft, nontender, nondistended. Bowel sounds present.  ?EXTREMITIES: No  edema b/l.    ?NEUROLOGIC: nonfocal  patient is alert and awake ?SKIN: No obvious rash, lesion, or ulcer.  ? ?LABORATORY PANEL:  ?CBC ?Recent Labs  ?Lab 04/01/22 ?0340  ?WBC 11.0*  ?HGB 10.0*  ?HCT 30.6*  ?PLT 202  ? ? ?Chemistries  ?Recent Labs  ?Lab 03/31/22 ?8938 04/01/22 ?0340  ?NA 134* 132*  ?K 4.0 4.1  ?CL 101 99  ?CO2 25 25  ?GLUCOSE 123* 104*  ?BUN 19 29*  ?CREATININE 1.15* 1.29*  ?CALCIUM 8.0* 8.2*  ?MG 1.8  --   ?AST 21  --   ?ALT 15  --   ?ALKPHOS 57  --   ?BILITOT 0.6  --   ? ?Cardiac Enzymes ?No results for input(s): TROPONINI in the last 168 hours. ?RADIOLOGY:  ?DG Chest 2 View ? ?Result Date: 03/30/2022 ?CLINICAL DATA:  Cough and weakness. EXAM: CHEST - 2 VIEW COMPARISON:  09/07/2021 FINDINGS: Anteriorly to attic positioning with patient's chin partially obscuring the apices despite repeat acquisition. Right lower lobe airspace opacity with small right pleural effusion. Stable upper normal heart size. Unchanged mediastinal contours. Aortic atherosclerosis. No pulmonary edema. No pneumothorax. The  bones are under mineralized. No acute osseous abnormalities are seen IMPRESSION: Right lower lobe airspace opacity with small right pleural effusion suspicious for pneumonia. Electronically Signed   By: Keith Rake M.D.   On: 03/30/2022 21:21   ? ?Assessment and Plan ?Autumn Johnston is a 88 y.o. female with medical history significant for essential hypertension, hyperlipidemia, CKD 4, anemia of chronic disease, B12 deficiency, ventral hernia, colostomy bag, who presented to [facility] ED with complaints of coughing, chest congestion x1 week.  Associated with generalized weakness and decrease in oral intake ? ?Right lower lobe community-acquired pneumonia: present on admission.  ?--Continue on IV rocephin, azithromycin, bronchodilators, mucinex & encourage incentive spirometry.  ? ?Leukocytosis: likely secondary to above infection. ?- Continue on abxs ?  ?Likely CKD: baseline Cr/GFR is unknown, currently stage IIIa. Avoid nephrotoxic meds  ?  ?Chronic depression Continue on home dose of zoloft  ?  ?HLD: continue on statin ?  ?Generalized weakness: OT/PT consulted  ?  ?Moderate protein calorie malnutrition: BMI 20. Continue on nutritional supplements  ?Nutrition Status: ?Nutrition Problem: Increased nutrient needs ?Etiology: acute illness ?Signs/Symptoms: estimated needs ?Interventions: Ensure Enlive (each supplement provides 350kcal and 20 grams of protein), MVI ? ? ? ? ?Family communication : daughter Autumn Johnston at bedside ?Consults : none ?CODE STATUS: full ?DVT Prophylaxis : Lovenox ?Level of care: Telemetry Medical ?Status is: Inpatient ?Remains inpatient appropriate because:  continue IV antibiotic for one more day. If continues to show improvement patient will discharged tomorrow. ?  ? ?TOTAL TIME TAKING CARE OF THIS PATIENT: 25 minutes.  ?>50% time spent on counselling and coordination of care ? ?Note: This dictation was prepared with Dragon dictation along with smaller phrase technology. Any transcriptional  errors that result from this process are unintentional. ? ?Fritzi Mandes M.D  ? ? ?[facility]  ? ?CC: ?Primary care physician; Idelle Crouch, MD  ?

## 2022-04-01 NOTE — Progress Notes (Addendum)
Initial Nutrition Assessment ? ?DOCUMENTATION CODES:  ? ?Not applicable ? ?INTERVENTION:  ? ?-Continue Ensure Enlive po BID, each supplement provides 350 kcal and 20 grams of protein ?-MVI with minerals daily ? ?NUTRITION DIAGNOSIS:  ? ?Increased nutrient needs related to acute illness as evidenced by estimated needs. ? ?GOAL:  ? ?Patient will meet greater than or equal to 90% of their needs ? ?MONITOR:  ? ?PO intake, Supplement acceptance, Labs, Weight trends, Skin, I & O's ? ?REASON FOR ASSESSMENT:  ? ?Malnutrition Screening Tool ?  ? ?ASSESSMENT:  ? ?Autumn Johnston is a 88 y.o. female with medical history significant for essential hypertension, hyperlipidemia, CKD 4, anemia of chronic disease, B12 deficiency, ventral hernia, colostomy bag, who presented to [facility] ED with complaints of coughing, chest congestion x1 week.  Associated with generalized weakness and decrease in oral intake.  Patient is unable to provide a full history due to mild confusion and hearing impairment, no family members at bedside.  Patient endorses chills and nausea with no vomiting.  She was brought into the ED from home via EMS.  Work-up in the ED revealed right lower lobe pneumonia seen on chest x-ray.  WBC of 13.9 K.  She was started on IV antibiotics empirically for community-acquired pneumonia.  TRH, hospitalist team, was asked to admit. ? ?Pt admitted with rt lower lobe CAP.  ? ?Reviewed I/O's: 0 ml x 24 hours and +1.5 L since admission ? ?UOP: 0 ml x 24 hours ? ?Spoke with pt at bedside, who reports feeling better today. Se repots decreased oral intake. She complains of some congestion on her chest. Noted pt consumed about 50% of lunch meal. Pt shares that she is unable to eat large quantities at one times but usually consumes 3 meals per day.  ? ?Pt reports her UBW is around 148#, but it has been years since she weighed that much. She endorses progressive wt loss over the past several years. Reviewed wt hx. Per [facility],  wt was 95# on 12/09/21. No wt loss over the past 4 months.  ? ?Discussed importance of good meal and supplement intake to promote healing.  ? ?Pt with generalized fat and muscle depletions likely related to advanced age. She complains of numbness on her feet and uses a walker at home. Pt shares that she had a hip fracture s/p surgery within the past year.  ? ?Medications reviewed.  ? ?Labs reviewed: Na: 132.  ? ?Nutrition-Focused Physical Exam ?Flowsheet Row Most Recent Value  ?Orbital Region No depletion  ?Upper Arm Region Moderate depletion  ?Thoracic and Lumbar Region No depletion  ?Buccal Region No depletion  ?Temple Region No depletion  ?Clavicle Bone Region Mild depletion  ?Clavicle and Acromion Bone Region Mild depletion  ?Scapular Bone Region Mild depletion  ?Dorsal Hand Mild depletion  ?Patellar Region Mild depletion  ?Anterior Thigh Region Mild depletion  ?Posterior Calf Region Mild depletion  ?Edema (RD Assessment) None  ?Hair Reviewed  ?Eyes Reviewed  ?Mouth Reviewed  ?Skin Reviewed  ?Nails Reviewed  ? ?  ?  ?Diet Order:   ?Diet Order   ? ?       ?  Diet regular Room service appropriate? Yes; Fluid consistency: Thin  Diet effective now       ?  ? ?  ?  ? ?  ? ? ?EDUCATION NEEDS:  ? ?No education needs have been identified at this time ? ?Skin:  Skin Assessment: Reviewed RN Assessment ? ?Last BM:  04/01/22 (  via colostomy) ? ?Height:  ? ?Ht Readings from Last 1 Encounters:  ?03/31/22 '5\' 2"'$  (1.575 m)  ? ? ?Weight:  ? ?Wt Readings from Last 1 Encounters:  ?03/31/22 50 kg  ? ? ?Ideal Body Weight:  50 kg ? ?BMI:  Body mass index is 20.16 kg/m?. ? ?Estimated Nutritional Needs:  ? ?Kcal:  1300-1500 ? ?Protein:  60-75 grams ? ?Fluid:  > 1.3 L ? ? ? ?Loistine Chance, RD, LDN, CDCES ?Registered Dietitian II ?Certified Diabetes Care and Education Specialist ?Please refer to [facility] for RD and/or RD on-call/weekend/after hours pager  ?

## 2022-04-01 NOTE — Progress Notes (Signed)
Physical Therapy Treatment ?Patient Details ?Name: Autumn Johnston ?MRN: [id number] ?DOB: May 01, 1922 ?Today's Date: 04/01/2022 ? ? ?History of Present Illness Patient is an 88 yo female that presented to ED for cough and weakness, workup showed community acquired PNA. PMH of HTN, HLD, CKD, ventral hernia, colostomy bag s/p colon cancer, L hip fracture. ? ?  ?PT Comments  ? ? Pt was long sitting in bed upon arriving. She is alert and agreeable to session. Overall a good historian and oriented to situation. She was on 2 L o2 upon arriving however O2 was removed during session with sao2> 92%. RN made aware post session that O2 was discontinued. Pt was able to safely get OOB, stand to RW, an ambulate 120 ft. She endorses fatigue and slight bilateral foot pain but overall did extremely well. Will address stairs in future sessions. Chief Strategy Officer discussed progress with daughter (via phone) and recommendation for returning home with HHPT to follow. Daughter does endorse living less than 3 miles away and will be able to assist pt as needed at DC. Pt's spouse unfortunately is also currently admitted and does assist pt with ADLs usually at home. ?    ?Recommendations for follow up therapy are one component of a multi-disciplinary discharge planning process, led by the attending physician.  Recommendations may be updated based on patient status, additional functional criteria and insurance authorization. ? ?Follow Up Recommendations ? Home health PT ?  ?  ?Assistance Recommended at Discharge Frequent or constant Supervision/Assistance  ?Patient can return home with the following Assistance with cooking/housework;Assist for transportation;Help with stairs or ramp for entrance ?  ?Equipment Recommendations ? None recommended by PT  ?  ?   ?Precautions / Restrictions Precautions ?Precautions: Fall ?Precaution Comments: colostomy bag ?Restrictions ?Weight Bearing Restrictions: No  ?  ? ?Mobility ? Bed Mobility ?Overal bed mobility:  Needs Assistance ?Bed Mobility: Supine to Sit ?  ?  ?Supine to sit: Supervision ?  ?  ?General bed mobility comments: no physical assistance required to exit R sid eof bed. increased time required however pt easily and safely able to perform without assistance ?  ? ?Transfers ?Overall transfer level: Needs assistance ?Equipment used: Rolling walker (2 wheels) ?Transfers: Sit to/from Stand ?Sit to Stand: Supervision ?  ?  ?  ?  ?  ?General transfer comment: pt stood from lowest bed height with supervision. vcs for improved fwd wt shift and proper handplacement. no physical assistance required to stand ?  ? ?Ambulation/Gait ?Ambulation/Gait assistance: Min guard, Supervision ?Gait Distance (Feet): 120 Feet ?Assistive device: Rolling walker (2 wheels) ?Gait Pattern/deviations: Step-through pattern ?Gait velocity: decreased ?  ?  ?General Gait Details: pt tolerated ambulation 120 ft with RW without LOB. sao2 > 92% on rm air. pt does express some fatigue but overall tolerated well. ? ? ?  ?Balance Overall balance assessment: Needs assistance ?Sitting-balance support: Feet supported ?Sitting balance-Leahy Scale: Good ?  ?  ?Standing balance support: Bilateral upper extremity supported, During functional activity ?Standing balance-Leahy Scale: Fair ?  ?   ?Cognition Arousal/Alertness: Awake/alert ?Behavior During Therapy: [facility] for tasks assessed/performed ?Overall Cognitive Status: Within Functional Limits for tasks assessed ?  ?   ?General Comments: Pt is alert and oriented x 3. good historian and agreeable to session. ?  ?  ? ?  ?   ?General Comments General comments (skin integrity, edema, etc.): will address stairs in future sessions ?  ?  ? ?Pertinent Vitals/Pain Pain Assessment ?Pain Assessment: 0-10 ?Pain Score: 3  ?  Pain Location: feet ?Pain Descriptors / Indicators: Aching, Tingling, Numbness, Burning ?Pain Intervention(s): Limited activity within patient's tolerance, Monitored during session, Repositioned   ? ? ? ?PT Goals (current goals can now be found in the care plan section) Acute Rehab PT Goals ?Patient Stated Goal: get better and go home ?Progress towards PT goals: Progressing toward goals ? ?  ?Frequency ? ? ? Min 2X/week ? ? ? ?  ?PT Plan Current plan remains appropriate  ? ? ?   ?AM-PAC PT "6 Clicks" Mobility   ?Outcome Measure ? Help needed turning from your back to your side while in a flat bed without using bedrails?: None ?Help needed moving from lying on your back to sitting on the side of a flat bed without using bedrails?: None ?Help needed moving to and from a bed to a chair (including a wheelchair)?: A Little ?Help needed standing up from a chair using your arms (e.g., wheelchair or bedside chair)?: A Little ?Help needed to walk in hospital room?: A Little ?Help needed climbing 3-5 steps with a railing? : A Little ?6 Click Score: 20 ? ?  ?End of Session   ?Activity Tolerance: Patient tolerated treatment well ?Patient left: in chair;with call bell/phone within reach;with nursing/sitter in room ?Nurse Communication: Mobility status ?PT Visit Diagnosis: Other abnormalities of gait and mobility (R26.89);Muscle weakness (generalized) (M62.81) ?  ? ? ?Time: 0940-1003 ?PT Time Calculation (min) (ACUTE ONLY): 23 min ? ?Charges:  $Gait Training: 8-22 mins ?$Therapeutic Activity: 8-22 mins          ?          ?Julaine Fusi PTA ?04/01/22, 10:42 AM  ? ?

## 2022-04-02 DIAGNOSIS — J189 Pneumonia, unspecified organism: Secondary | ICD-10-CM | POA: Diagnosis not present

## 2022-04-02 LAB — MAGNESIUM: Magnesium: 2 mg/dL (ref 1.7–2.4)

## 2022-04-02 LAB — LEGIONELLA PNEUMOPHILA SEROGP 1 UR AG: L. pneumophila Serogp 1 Ur Ag: NEGATIVE

## 2022-04-02 LAB — BASIC METABOLIC PANEL
Anion gap: 7 (ref 5–15)
BUN: 35 mg/dL — ABNORMAL HIGH (ref 8–23)
CO2: 26 mmol/L (ref 22–32)
Calcium: 8.5 mg/dL — ABNORMAL LOW (ref 8.9–10.3)
Chloride: 100 mmol/L (ref 98–111)
Creatinine, Ser: 1.35 mg/dL — ABNORMAL HIGH (ref 0.44–1.00)
GFR, Estimated: 36 mL/min — ABNORMAL LOW (ref >60)
Glucose, Bld: 106 mg/dL — ABNORMAL HIGH (ref 70–99)
Potassium: 4.2 mmol/L (ref 3.5–5.1)
Sodium: 133 mmol/L — ABNORMAL LOW (ref 135–145)

## 2022-04-02 MED ORDER — AMOXICILLIN-POT CLAVULANATE 500-125 MG PO TABS: 500.0000 mg | ORAL_TABLET | Freq: Two times a day (BID) | ORAL | 0 refills | Status: AC

## 2022-04-02 MED ORDER — POLYETHYLENE GLYCOL 3350 17 G PO PACK: 17.0000 g | PACK | Freq: Every day | ORAL | 0 refills | Status: DC | PRN

## 2022-04-02 MED ORDER — DM-GUAIFENESIN ER 30-600 MG PO TB12: 1.0000 | ORAL_TABLET | Freq: Two times a day (BID) | ORAL | 0 refills | Status: AC

## 2022-04-02 NOTE — Discharge Summary (Signed)
?Physician Discharge Summary ?  ?Patient: Autumn Johnston MRN: [id number] DOB: 01/04/1922  ?Admit date:     03/30/2022  ?Discharge date: 04/02/22  ?Discharge Physician: Fritzi Mandes  ? ?PCP: Idelle Crouch, MD  ? ?Recommendations at discharge:  ? ? F/u PC In 1-2 weeks ? ?Discharge Diagnoses: ?Right LL Pneumonia ? ?Hospital Course: ? ?Lakely Elmendorf is a 88 y.o. female with medical history significant for essential hypertension, hyperlipidemia, CKD 4, anemia of chronic disease, B12 deficiency, ventral hernia, colostomy bag, who presented to [facility] ED with complaints of coughing, chest congestion x1 week.  Associated with generalized weakness and decrease in oral intake ?  ?Right lower lobe community-acquired pneumonia: present on admission.  ?--Continue on IV rocephin, azithromycin, bronchodilators, mucinex & encourage incentive spirometry.  ?--change to po augmentin per phram rec ?--wbc down to 11K ?--No fever ?--Sats >92% on RA ?  ?Leukocytosis: likely secondary to above infection. ?- Continue on abxs ?--improving ?  ?Likely CKD: baseline Cr/GFR is unknown, currently stage IIIa. Avoid nephrotoxic meds  ?  ?Chronic depression Continue on home dose of zoloft  ?  ?HLD: continue on statin ?  ?Generalized weakness: OT/PT  recommends HHPT ?  ?Moderate protein calorie malnutrition: BMI 20. Continue on nutritional supplements  ?Nutrition Status: ?Nutrition Problem: Increased nutrient needs ?Etiology: acute illness ?Signs/Symptoms: estimated needs ?Interventions: Ensure Enlive (each supplement provides 350kcal and 20 grams of protein), MVI ? ?Overall improving d/w both dters (room and phone) d/c home with HHPT ?  ? ? ?Disposition: Home health ?Diet recommendation:  ?Discharge Diet Orders (From admission, onward)  ? ?  Start     Ordered  ? 04/02/22 0000  Diet - low sodium heart healthy       ? 04/02/22 1310  ? ?  ?  ? ?  ? ?Cardiac diet ?DISCHARGE MEDICATION: ?Allergies as of 04/02/2022   ?No Known Allergies ?  ? ?   ?Medication List  ?  ? ?STOP taking these medications   ? ?QUEtiapine 25 MG tablet ?Commonly known as: SEROQUEL ?  ? ?  ? ?TAKE these medications   ? ?ALPRAZolam 0.25 MG tablet ?Commonly known as: Duanne Moron ?Take 1 tablet (0.25 mg total) by mouth 2 (two) times daily as needed. ?  ?amoxicillin-clavulanate 500-125 MG tablet ?Commonly known as: AUGMENTIN ?Take 1 tablet (500 mg total) by mouth every 12 (twelve) hours for 5 days. ?  ?dextromethorphan-guaiFENesin 30-600 MG 12hr tablet ?Commonly known as: [place] DM ?Take 1 tablet by mouth 2 (two) times daily for 7 days. ?  ?polyethylene glycol 17 g packet ?Commonly known as: MIRALAX / GLYCOLAX ?Take 17 g by mouth daily as needed for mild constipation. ?  ?sertraline 50 MG tablet ?Commonly known as: ZOLOFT ?Take 50 mg by mouth daily. ?  ?simvastatin 20 MG tablet ?Commonly known as: ZOCOR ?Take 20 mg by mouth at bedtime. ?  ?zolpidem 10 MG tablet ?Commonly known as: AMBIEN ?Take 10 mg by mouth at bedtime as needed. ?  ? ?  ? ? Follow-up Information   ? ? Idelle Crouch, MD. Schedule an appointment as soon as possible for a visit in 1 week(s).   ?Specialty: Internal Medicine ?Why: hospital f/u ?Contact information: ?[place] [address] ?[phone] ? ? ?  ?  ? ?  ?  ? ?  ? ?Discharge Exam: ?Danley Danker Weights  ? 03/31/22 0107  ?Weight: 50 kg  ? ? ? ?Condition at discharge: fair ? ?The results  of significant diagnostics from this hospitalization (including imaging, microbiology, ancillary and laboratory) are listed below for reference.  ? ?Imaging Studies: ?DG Chest 2 View ? ?Result Date: 03/30/2022 ?CLINICAL DATA:  Cough and weakness. EXAM: CHEST - 2 VIEW COMPARISON:  09/07/2021 FINDINGS: Anteriorly to attic positioning with patient's chin partially obscuring the apices despite repeat acquisition. Right lower lobe airspace opacity with small right pleural effusion. Stable upper normal heart size. Unchanged mediastinal contours. Aortic  atherosclerosis. No pulmonary edema. No pneumothorax. The bones are under mineralized. No acute osseous abnormalities are seen IMPRESSION: Right lower lobe airspace opacity with small right pleural effusion suspicious for pneumonia. Electronically Signed   By: Keith Rake M.D.   On: 03/30/2022 21:21   ? ?Microbiology: ?Results for orders placed or performed during the hospital encounter of 03/30/22  ?Urine Culture     Status: Abnormal  ? Collection Time: 03/30/22  8:43 PM  ? Specimen: Urine, Random  ?Result Value Ref Range Status  ? Specimen Description   Final  ?  URINE, RANDOM ?Performed at [facility], [address], [place], [place] [address] ?  ? Special Requests   Final  ?  NONE ?Performed at [facility], [address], [place], [place] [address] ?  ? Culture MULTIPLE SPECIES PRESENT, SUGGEST RECOLLECTION (A)  Final  ? Report Status 04/01/2022 FINAL  Final  ?Culture, blood (routine x 2)     Status: None (Preliminary result)  ? Collection Time: 03/30/22 11:16 PM  ? Specimen: BLOOD  ?Result Value Ref Range Status  ? Specimen Description BLOOD RIGHT ASSIST CONTROL  Final  ? Special Requests   Final  ?  BOTTLES DRAWN AEROBIC AND ANAEROBIC Blood Culture results may not be optimal due to an inadequate volume of blood received in culture bottles  ? Culture   Final  ?  NO GROWTH 2 DAYS ?Performed at [facility], [address], [place], [place] [address] ?  ? Report Status PENDING  Incomplete  ?Culture, blood (routine x 2)     Status: None (Preliminary result)  ? Collection Time: 03/30/22 11:16 PM  ? Specimen: BLOOD  ?Result Value Ref Range Status  ? Specimen Description BLOOD RIGHT ARM  Final  ? Special Requests   Final  ?  BOTTLES DRAWN AEROBIC AND ANAEROBIC Blood Culture results may not be optimal due to an inadequate volume of blood received in culture bottles  ? Culture   Final  ?  NO GROWTH 2 DAYS ?Performed at [facility], [address], [place], [place]  [address] ?  ? Report Status PENDING  Incomplete  ?Resp Panel by RT-PCR (Flu A&B, Covid) Nasopharyngeal Swab     Status: None  ? Collection Time: 03/30/22 11:16 PM  ? Specimen: Nasopharyngeal Swab; Nasopharyngeal(NP) swabs in vial transport medium  ?Result Value Ref Range Status  ? SARS Coronavirus 2 by RT PCR NEGATIVE NEGATIVE Final  ?  Comment: (NOTE) ?SARS-CoV-2 target nucleic acids are NOT DETECTED. ? ?The SARS-CoV-2 RNA is generally detectable in upper respiratory ?specimens during the acute phase of infection. The lowest ?concentration of SARS-CoV-2 viral copies this assay can detect is ?138 copies/mL. A negative result does not preclude SARS-Cov-2 ?infection and should not be used as the sole basis for treatment or ?other patient management decisions. A negative result may occur with  ?improper specimen collection/handling, submission of specimen other ?than nasopharyngeal swab, presence of viral mutation(s) within the ?areas targeted by this assay, and inadequate number of viral ?  copies(<138 copies/mL). A negative result must be combined with ?clinical observations, patient history, and epidemiological ?information. The expected result is Negative. ? ?Fact Sheet for Patients:  ?EntrepreneurPulse.com.au ? ?Fact Sheet for Healthcare Providers:  ?IncredibleEmployment.be ? ?This test is no t yet approved or cleared by the [place] FDA and  ?has been authorized for detection and/or diagnosis of SARS-CoV-2 by ?FDA under an Emergency Use Authorization (EUA). This EUA will remain  ?in effect (meaning this test can be used) for the duration of the ?COVID-19 declaration under Section 564(b)(1) of the Act, 21 ?U.S.C.section 360bbb-3(b)(1), unless the authorization is terminated  ?or revoked sooner.  ? ? ?  ? Influenza A by PCR NEGATIVE NEGATIVE Final  ? Influenza B by PCR NEGATIVE NEGATIVE Final  ?  Comment: (NOTE) ?The Xpert Xpress SARS-CoV-2/FLU/RSV plus assay is intended as an  aid ?in the diagnosis of influenza from Nasopharyngeal swab specimens and ?should not be used as a sole basis for treatment. Nasal washings and ?aspirates are unacceptable for Xpert Xpress SARS-CoV-2/FLU/RSV ?

## 2022-04-02 NOTE — Progress Notes (Signed)
Physical Therapy Treatment ?Patient Details ?Name: Autumn Johnston ?MRN: [id number] ?DOB: [birth year] ?Today's Date: 04/02/2022 ? ? ?History of Present Illness Patient is an 88 yo female that presented to ED for cough and weakness, workup showed community acquired PNA. PMH of HTN, HLD, CKD, ventral hernia, colostomy bag s/p colon cancer, L hip fracture. ? ?  ?PT Comments  ? ? Physical Therapy session completed this date. Patient tolerated session well and was agreeable to treatment. Upon entering room patient was seated in recliner with family present and no pain reported. Patient tolerated increased distance during session today, completing ~148f (room>stairwell> back to room). Ambulation completed with RW at CGA/SBA. Due to patient fatiguing quickly, she required 2 seated rest breaks (daughter followed with purple arm chair). Patient ascended/descended 2 steps with a right hand rail and L HHA at CGA/Min A. Steps were completed in a step to pattern. Mild-moderate unsteadiness noted due to patient's BLE weakness, however no LOB was noted. Patient was left in recliner with all needs met and family present.  Patient would continue to benefit from skilled physical therapy in order to optimize patients return to PLOF. Continue to recommend HHPT upon discharge from acute hospitalization.  ?  ?Recommendations for follow up therapy are one component of a multi-disciplinary discharge planning process, led by the attending physician.  Recommendations may be updated based on patient status, additional functional criteria and insurance authorization. ? ?Follow Up Recommendations ? Home health PT ?  ?  ?Assistance Recommended at Discharge Frequent or constant Supervision/Assistance  ?Patient can return home with the following Assistance with cooking/housework;Assist for transportation;Help with stairs or ramp for entrance ?  ?Equipment Recommendations ? None recommended by PT  ?  ?Recommendations for Other Services   ? ? ?   ?Precautions / Restrictions Precautions ?Precautions: Fall ?Precaution Comments: colostomy bag ?Restrictions ?Weight Bearing Restrictions: No  ?  ? ?Mobility ? Bed Mobility ?  ?  ?  ?  ?  ?  ?  ?General bed mobility comments: patient started and ended session in recliner ?  ? ?Transfers ?Overall transfer level: Needs assistance ?Equipment used: Rolling walker (2 wheels) ?Transfers: Sit to/from Stand ?Sit to Stand: Supervision (from recliner) ?  ?  ?  ?  ?  ?  ?  ? ?Ambulation/Gait ?Ambulation/Gait assistance: Min guard, Supervision ?Gait Distance (Feet): 160 Feet ?Assistive device: Rolling walker (2 wheels) ?Gait Pattern/deviations: Step-through pattern ?Gait velocity: decreased ?  ?  ?General Gait Details: pt tolerated ambulation 120 ft with RW without LOB. sao2 > 90% on rm air. Patient does fatigue quickly, and required 2 seated rest breaks during ambulation bout ? ? ?Stairs ?Stairs: Yes ?Stairs assistance: Min assist ?Stair Management: One rail Right, Step to pattern (and HHA) ?Number of Stairs: 2 ?General stair comments: mild-moderately unsteady, however patient was able to complete with a step to pattern. Patient and daughter were educated on proper standing position for family member helping patient on the steps (between the patient and the floor). Daughter and patient verbalized understanding ? ? ?Wheelchair Mobility ?  ? ?Modified Rankin (Stroke Patients Only) ?  ? ? ?  ?Balance Overall balance assessment: Needs assistance ?Sitting-balance support: Feet supported ?Sitting balance-Leahy Scale: Good ?  ?  ?Standing balance support: Bilateral upper extremity supported, During functional activity ?Standing balance-Leahy Scale: Fair ?  ?  ?  ?  ?  ?  ?  ?  ?  ?  ?  ?  ?  ? ?  ?Cognition Arousal/Alertness: Awake/alert ?  Behavior During Therapy: [facility] for tasks assessed/performed ?Overall Cognitive Status: Within Functional Limits for tasks assessed ?  ?  ?  ?  ?  ?  ?  ?  ?  ?  ?  ?  ?  ?  ?  ?  ?General Comments:  Pleasant and agreeable to session. ?  ?  ? ?  ?Exercises   ? ?  ?General Comments General comments (skin integrity, edema, etc.): SpO2 remained >90% throughout session, HR ranged from 88-103bpm throughout session ?  ?  ? ?Pertinent Vitals/Pain Pain Assessment ?Pain Assessment: No/denies pain ?Pain Intervention(s): Monitored during session  ? ? ?Home Living   ?  ?  ?  ?  ?  ?  ?  ?  ?  ?   ?  ?Prior Function    ?  ?  ?   ? ?PT Goals (current goals can now be found in the care plan section) Acute Rehab PT Goals ?Patient Stated Goal: get better and go home ?PT Goal Formulation: With patient ?Time For Goal Achievement: 04/14/22 ?Potential to Achieve Goals: Good ?Progress towards PT goals: Progressing toward goals ? ?  ?Frequency ? ? ? Min 2X/week ? ? ? ?  ?PT Plan Current plan remains appropriate  ? ? ?Co-evaluation   ?  ?  ?  ?  ? ?  ?AM-PAC PT "6 Clicks" Mobility   ?Outcome Measure ? Help needed turning from your back to your side while in a flat bed without using bedrails?: None ?Help needed moving from lying on your back to sitting on the side of a flat bed without using bedrails?: None ?Help needed moving to and from a bed to a chair (including a wheelchair)?: A Little ?Help needed standing up from a chair using your arms (e.g., wheelchair or bedside chair)?: A Little ?Help needed to walk in hospital room?: A Little ?Help needed climbing 3-5 steps with a railing? : A Little ?6 Click Score: 20 ? ?  ?End of Session Equipment Utilized During Treatment: Gait belt ?Activity Tolerance: Patient tolerated treatment well ?Patient left: in chair;with call bell/phone within reach;with family/visitor present;with chair alarm set ?Nurse Communication: Mobility status ?PT Visit Diagnosis: Other abnormalities of gait and mobility (R26.89);Muscle weakness (generalized) (M62.81) ?  ? ? ?Time: 7583-0746 ?PT Time Calculation (min) (ACUTE ONLY): 26 min ? ?Charges:  $Gait Training: 8-22 mins ?$Therapeutic Activity: 8-22 mins           ?          ? ?Iva Boop, PT  ?04/02/22. 1:51 PM ? ? ?

## 2022-04-04 LAB — CULTURE, BLOOD (ROUTINE X 2)
Culture: NO GROWTH
Culture: NO GROWTH

## 2022-11-06 IMAGING — CT CT HEAD W/O CM
3 series · 16 of 47 positions shown, 19 images · non-contrast
Comparison: CT brain 09/19/2012

CLINICAL DATA: Weakness shortness of breath altered

EXAM:
CT HEAD WITHOUT CONTRAST
TECHNIQUE: Contiguous axial images were obtained from the base of the skull
through the vertex without intravenous contrast.

[Series 3: head wo · axial · 0.39mm/px · z∈[-120,+5]mm · 10 of 31 slices shown, 13 images]
[im 3/31  brain]
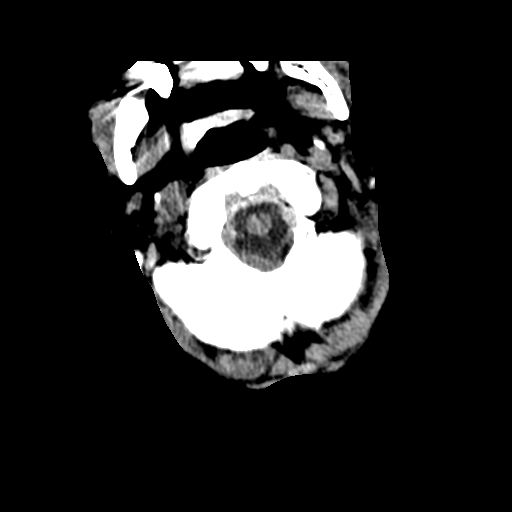
[im 3/31  bone]
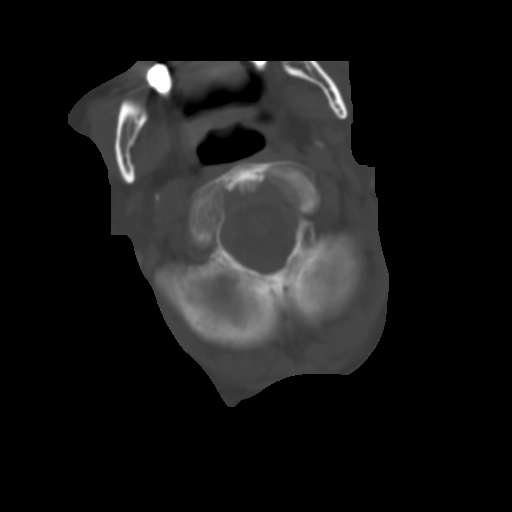
[im 6/31  brain]
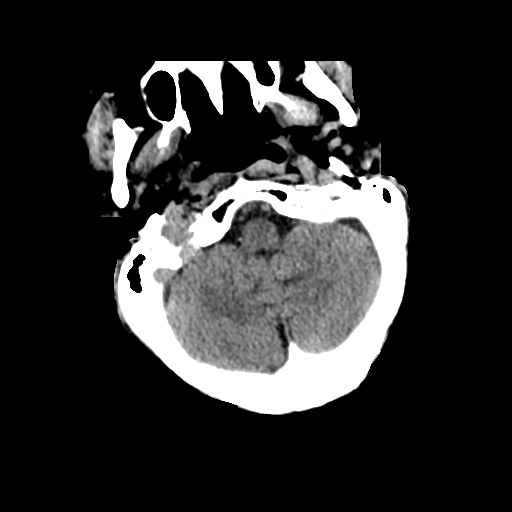
[im 9/31  brain]
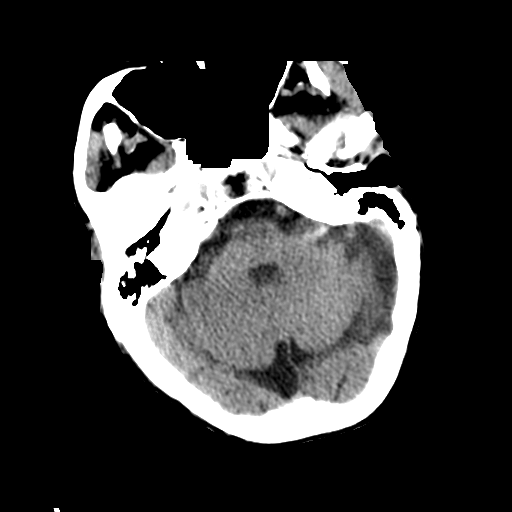
[im 11/31  brain]
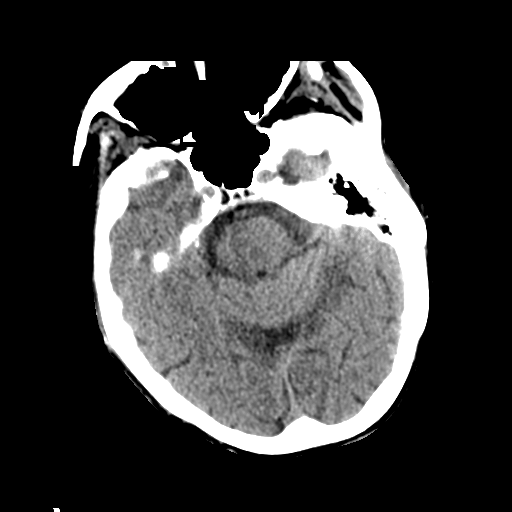
[im 14/31  brain]
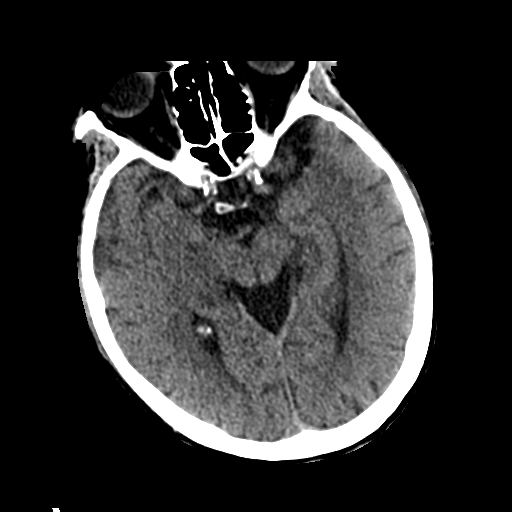
[im 14/31  bone]
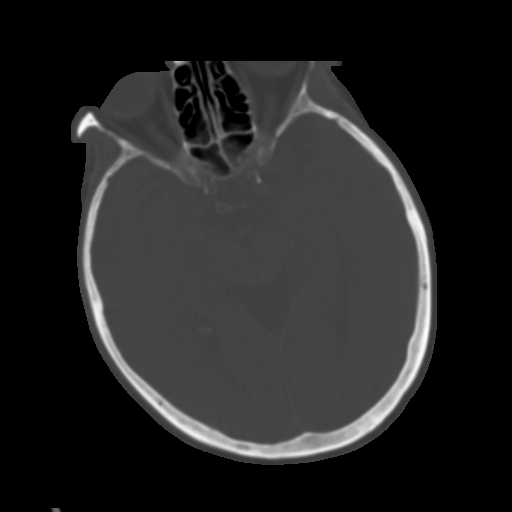
[im 17/31  brain]
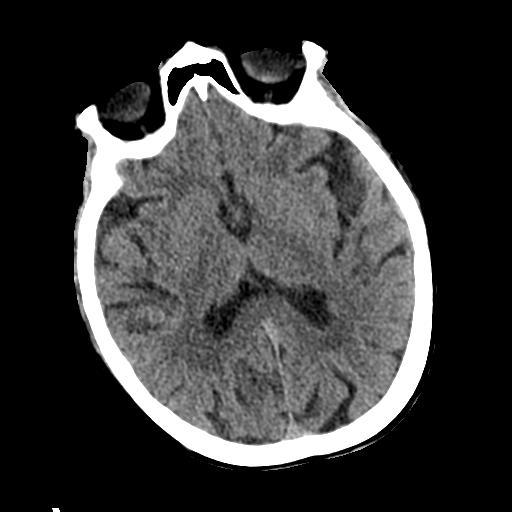
[im 20/31  brain]
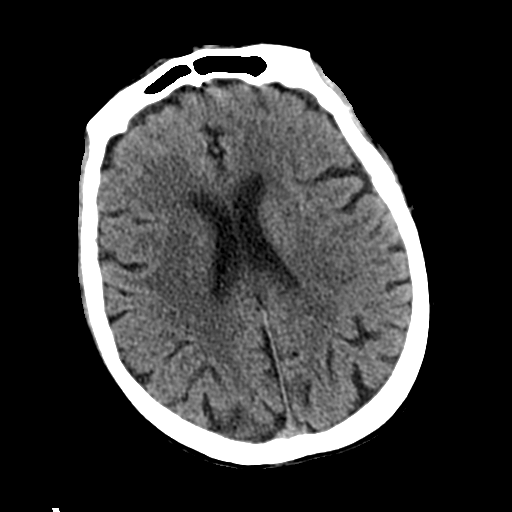
[im 23/31  brain]
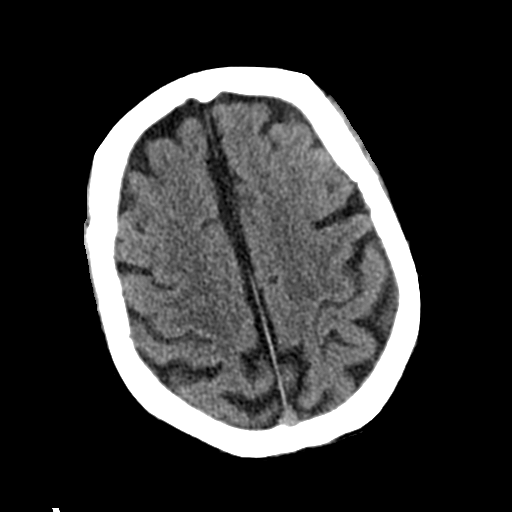
[im 25/31  brain]
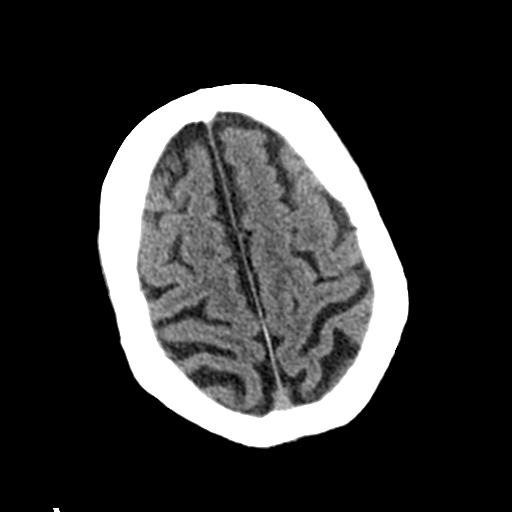
[im 25/31  bone]
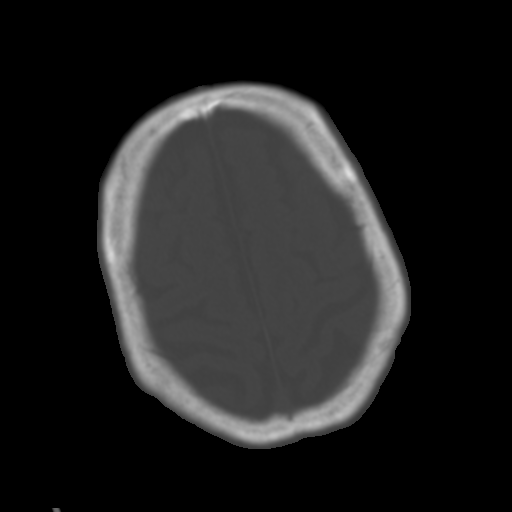
[im 28/31  brain]
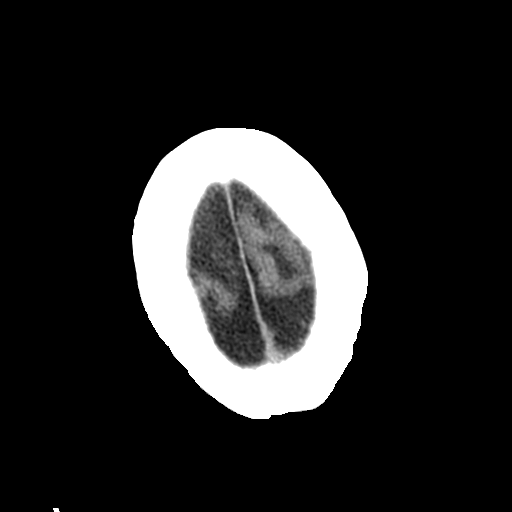

[Series 4: coronal soft tissue · coronal · 0.32mm/px · 3 of 62 slices shown]
[im 21/62  brain]
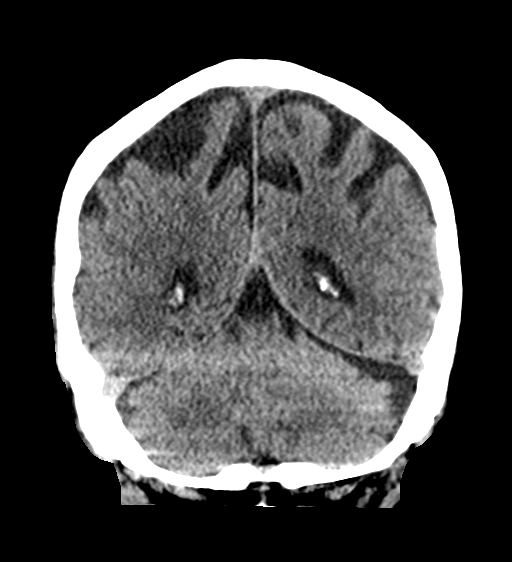
[im 28/62  brain]
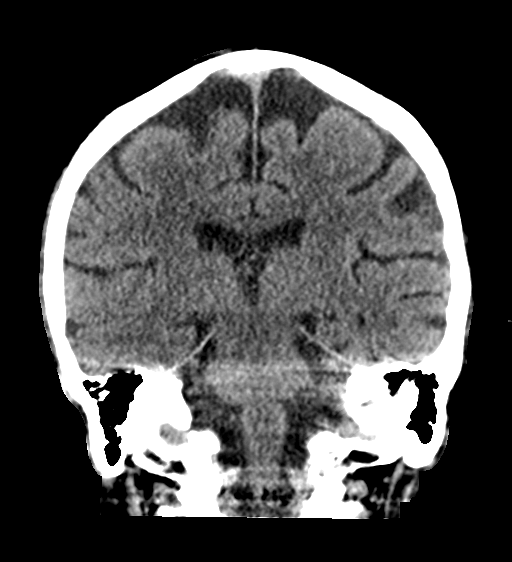
[im 34/62  brain]
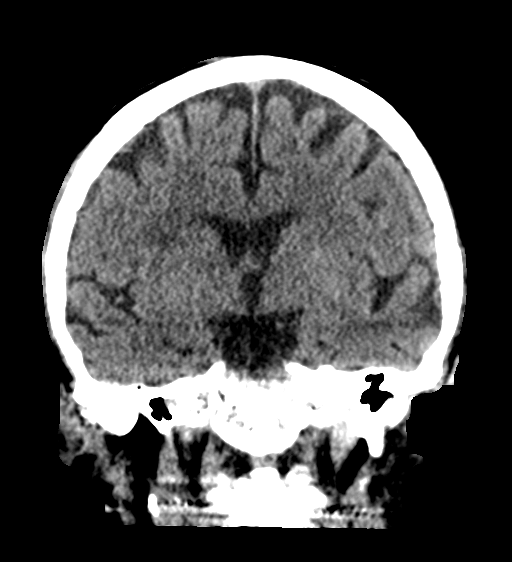

[Series 5: sagittal soft tissue · sagittal · 0.35mm/px · 3 of 54 slices shown]
[im 18/54  brain]
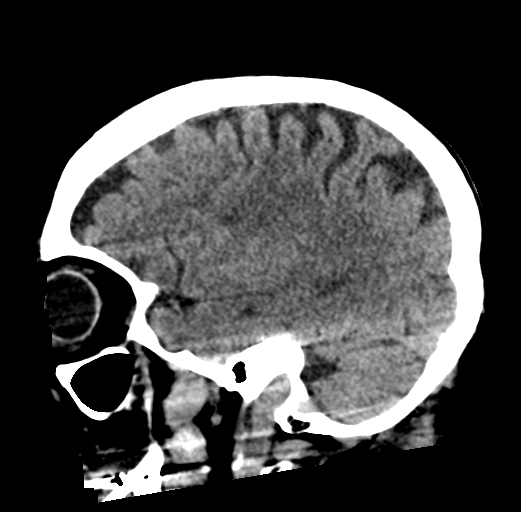
[im 27/54  brain]
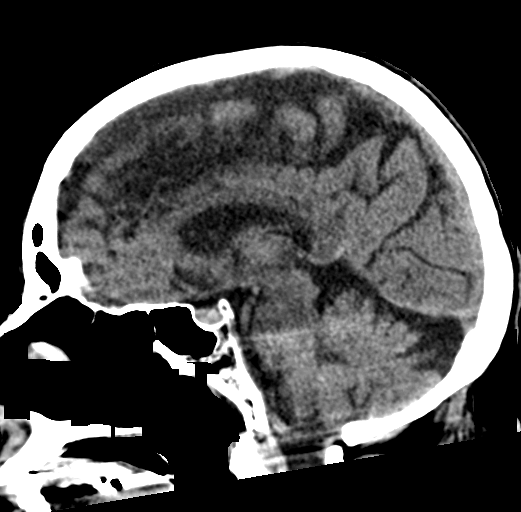
[im 36/54  brain]
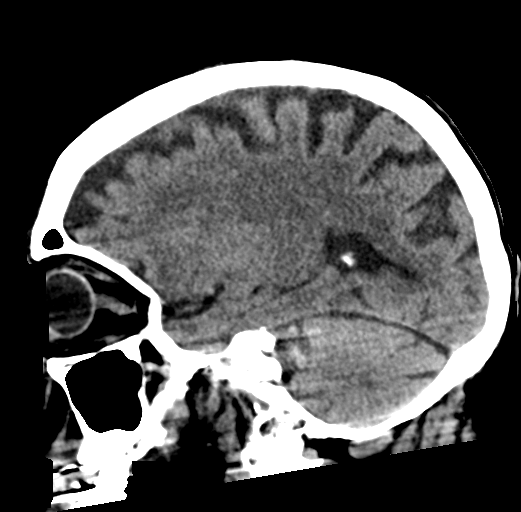

[16 of 47 positions shown; findings below may reference images not displayed]

FINDINGS: Brain: No acute territorial infarction, hemorrhage or intracranial
mass. Moderate atrophy. Nonenlarged ventricles

Vascular: No hyperdense vessels.  Carotid vascular calcification

Skull: Normal. Negative for fracture or focal lesion.

Sinuses/Orbits: No acute finding.

Other: None
IMPRESSION: 1. No CT evidence for acute intracranial abnormality.
2. Atrophy

## 2022-11-06 IMAGING — DX DG CHEST 1V
1 series · 1 of 1 positions shown · non-contrast
Comparison: 03/03/2010

CLINICAL DATA: Weakness, shortness of breath, nausea

EXAM:
CHEST  1 VIEW

[chest ap]
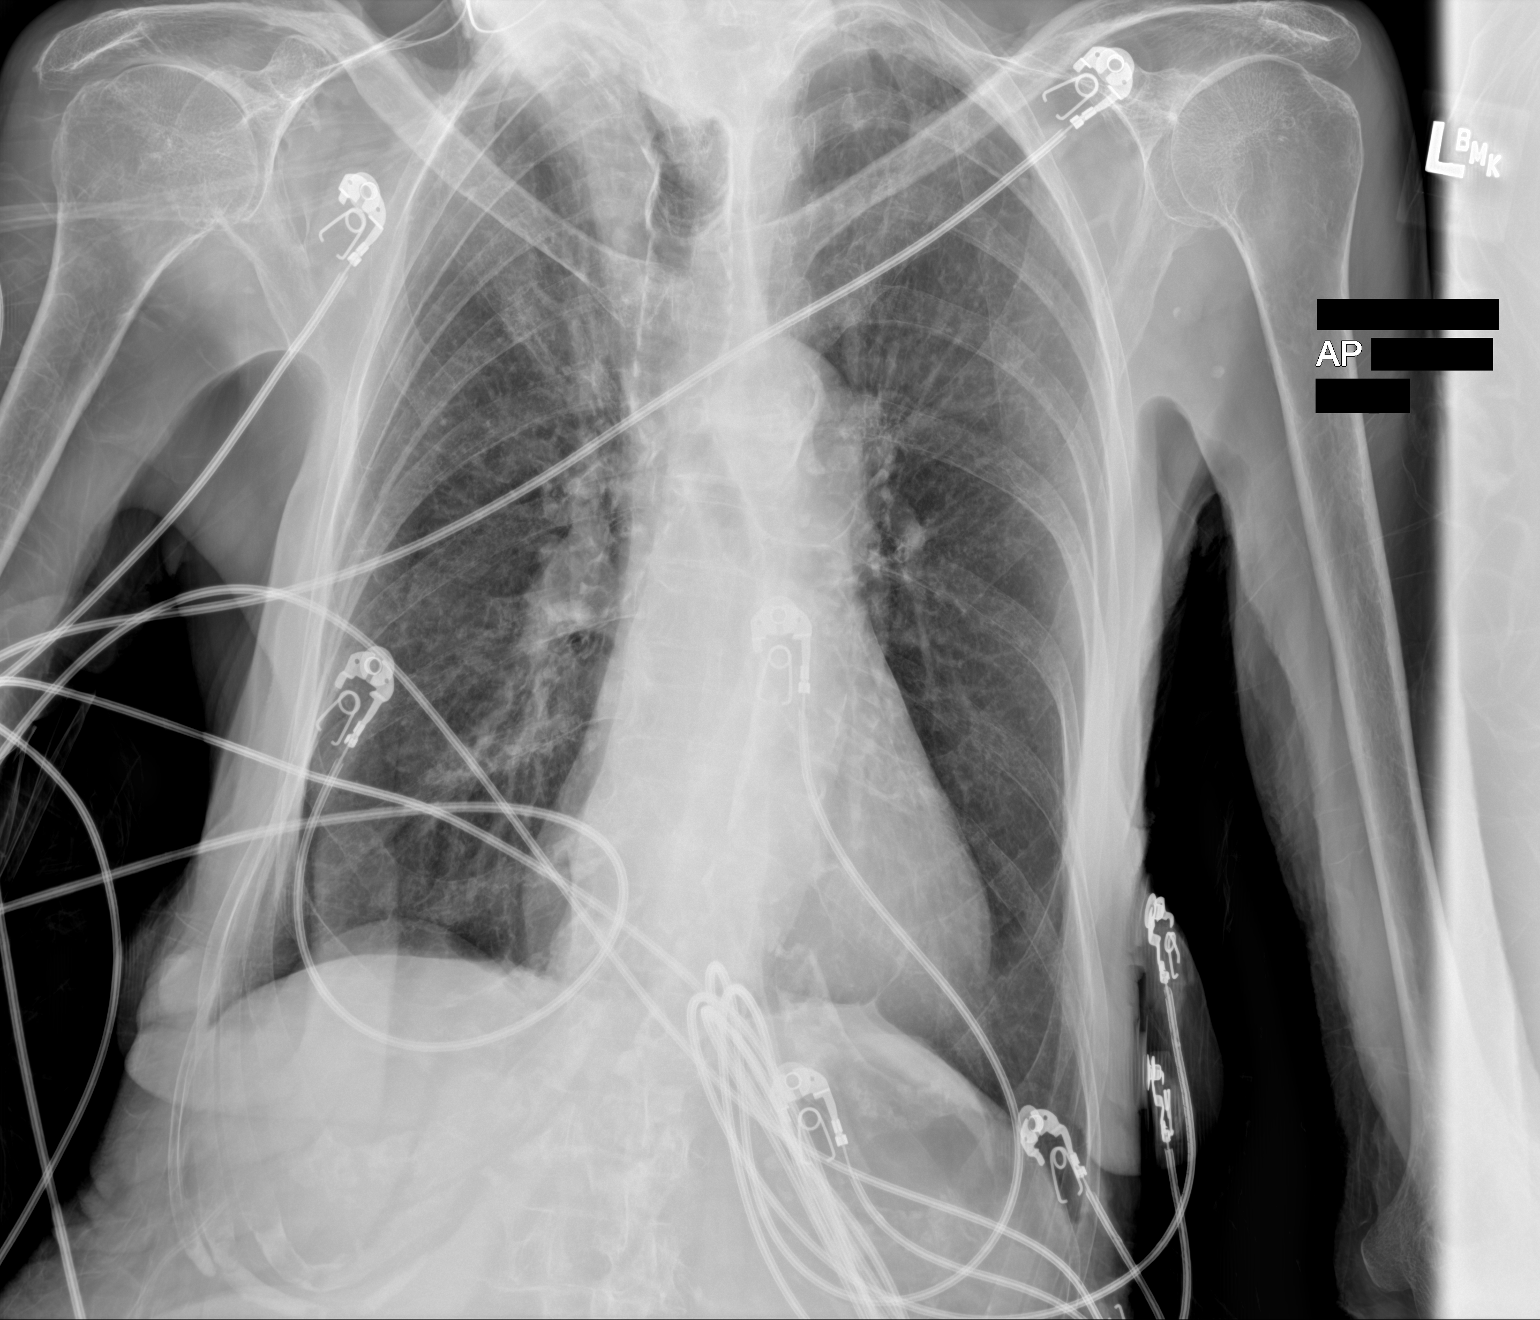

[1 of 1 positions shown; findings below may reference images not displayed]

FINDINGS: There is hyperinflation of the lungs compatible with COPD. Biapical
scarring. Heart is upper limits normal in size. No confluent
opacities or effusions. No acute bony abnormality.
IMPRESSION: COPD/chronic changes.  No active disease.

## 2023-08-06 ENCOUNTER — Emergency Department: Payer: Medicare Other

## 2023-08-06 ENCOUNTER — Inpatient Hospital Stay
Admission: EM | Admit: 2023-08-06 | Discharge: 2023-08-12 | DRG: 644 | Disposition: A | Discharge status: Home Health | Payer: Medicare Other | Attending: Internal Medicine | Admitting: Internal Medicine

## 2023-08-06 ENCOUNTER — Other Ambulatory Visit: Payer: Self-pay

## 2023-08-06 DIAGNOSIS — Z933 Colostomy status: Secondary | ICD-10-CM

## 2023-08-06 DIAGNOSIS — C189 Malignant neoplasm of colon, unspecified: Secondary | ICD-10-CM | POA: Diagnosis present

## 2023-08-06 DIAGNOSIS — I129 Hypertensive chronic kidney disease with stage 1 through stage 4 chronic kidney disease, or unspecified chronic kidney disease: Secondary | ICD-10-CM | POA: Diagnosis present

## 2023-08-06 DIAGNOSIS — Z1152 Encounter for screening for COVID-19: Secondary | ICD-10-CM

## 2023-08-06 DIAGNOSIS — S32040A Wedge compression fracture of fourth lumbar vertebra, initial encounter for closed fracture: Secondary | ICD-10-CM

## 2023-08-06 DIAGNOSIS — R059 Cough, unspecified: Secondary | ICD-10-CM | POA: Diagnosis present

## 2023-08-06 DIAGNOSIS — K5900 Constipation, unspecified: Secondary | ICD-10-CM | POA: Diagnosis not present

## 2023-08-06 DIAGNOSIS — E871 Hypo-osmolality and hyponatremia: Secondary | ICD-10-CM | POA: Diagnosis present

## 2023-08-06 DIAGNOSIS — E785 Hyperlipidemia, unspecified: Secondary | ICD-10-CM | POA: Diagnosis present

## 2023-08-06 DIAGNOSIS — R54 Age-related physical debility: Secondary | ICD-10-CM | POA: Insufficient documentation

## 2023-08-06 DIAGNOSIS — K56609 Unspecified intestinal obstruction, unspecified as to partial versus complete obstruction: Secondary | ICD-10-CM

## 2023-08-06 DIAGNOSIS — N1330 Unspecified hydronephrosis: Secondary | ICD-10-CM

## 2023-08-06 DIAGNOSIS — Z66 Do not resuscitate: Secondary | ICD-10-CM | POA: Diagnosis present

## 2023-08-06 DIAGNOSIS — T43224A Poisoning by selective serotonin reuptake inhibitors, undetermined, initial encounter: Secondary | ICD-10-CM | POA: Diagnosis present

## 2023-08-06 DIAGNOSIS — E222 Syndrome of inappropriate secretion of antidiuretic hormone: Principal | ICD-10-CM | POA: Diagnosis present

## 2023-08-06 DIAGNOSIS — D638 Anemia in other chronic diseases classified elsewhere: Secondary | ICD-10-CM | POA: Diagnosis present

## 2023-08-06 DIAGNOSIS — F0390 Unspecified dementia without behavioral disturbance: Secondary | ICD-10-CM | POA: Diagnosis present

## 2023-08-06 DIAGNOSIS — I160 Hypertensive urgency: Secondary | ICD-10-CM | POA: Diagnosis present

## 2023-08-06 DIAGNOSIS — Z681 Body mass index (BMI) 19 or less, adult: Secondary | ICD-10-CM

## 2023-08-06 DIAGNOSIS — Z79899 Other long term (current) drug therapy: Secondary | ICD-10-CM

## 2023-08-06 DIAGNOSIS — M4856XA Collapsed vertebra, not elsewhere classified, lumbar region, initial encounter for fracture: Secondary | ICD-10-CM | POA: Diagnosis present

## 2023-08-06 DIAGNOSIS — N1832 Chronic kidney disease, stage 3b: Secondary | ICD-10-CM | POA: Diagnosis present

## 2023-08-06 DIAGNOSIS — K435 Parastomal hernia without obstruction or  gangrene: Secondary | ICD-10-CM | POA: Diagnosis present

## 2023-08-06 DIAGNOSIS — E46 Unspecified protein-calorie malnutrition: Secondary | ICD-10-CM | POA: Diagnosis present

## 2023-08-06 DIAGNOSIS — E538 Deficiency of other specified B group vitamins: Secondary | ICD-10-CM | POA: Diagnosis present

## 2023-08-06 HISTORY — DX: Unspecified dementia, unspecified severity, without behavioral disturbance, psychotic disturbance, mood disturbance, and anxiety: F03.90

## 2023-08-06 HISTORY — DX: Depression, unspecified: F32.A

## 2023-08-06 HISTORY — DX: Parastomal hernia without obstruction or gangrene: K43.5

## 2023-08-06 LAB — COMPREHENSIVE METABOLIC PANEL
ALT: 14 U/L (ref 0–44)
AST: 25 U/L (ref 15–41)
Albumin: 4 g/dL (ref 3.5–5.0)
Alkaline Phosphatase: 49 U/L (ref 38–126)
Anion gap: 11 (ref 5–15)
BUN: 20 mg/dL (ref 8–23)
CO2: 24 mmol/L (ref 22–32)
Calcium: 9 mg/dL (ref 8.9–10.3)
Chloride: 88 mmol/L — ABNORMAL LOW (ref 98–111)
Creatinine, Ser: 1.14 mg/dL — ABNORMAL HIGH (ref 0.44–1.00)
GFR, Estimated: 44 mL/min — ABNORMAL LOW (ref >60)
Glucose, Bld: 135 mg/dL — ABNORMAL HIGH (ref 70–99)
Potassium: 3.5 mmol/L (ref 3.5–5.1)
Sodium: 123 mmol/L — ABNORMAL LOW (ref 135–145)
Total Bilirubin: 1 mg/dL (ref 0.3–1.2)
Total Protein: 7.6 g/dL (ref 6.5–8.1)

## 2023-08-06 LAB — TROPONIN I (HIGH SENSITIVITY): Troponin I (High Sensitivity): 46 ng/L — ABNORMAL HIGH (ref <18)

## 2023-08-06 LAB — URINALYSIS, ROUTINE W REFLEX MICROSCOPIC
Bacteria, UA: NONE SEEN
Bilirubin Urine: NEGATIVE
Glucose, UA: NEGATIVE mg/dL
Ketones, ur: NEGATIVE mg/dL
Leukocytes,Ua: NEGATIVE
Nitrite: NEGATIVE
Protein, ur: 100 mg/dL — AB
Specific Gravity, Urine: 1.008 (ref 1.005–1.030)
pH: 7 (ref 5.0–8.0)

## 2023-08-06 LAB — CBC
HCT: 37.6 % (ref 36.0–46.0)
Hemoglobin: 12.7 g/dL (ref 12.0–15.0)
MCH: 29.1 pg (ref 26.0–34.0)
MCHC: 33.8 g/dL (ref 30.0–36.0)
MCV: 86.2 fL (ref 80.0–100.0)
Platelets: 255 10*3/uL (ref 150–400)
RBC: 4.36 MIL/uL (ref 3.87–5.11)
RDW: 13.1 % (ref 11.5–15.5)
WBC: 7.6 10*3/uL (ref 4.0–10.5)
nRBC: 0 % (ref 0.0–0.2)

## 2023-08-06 LAB — LIPASE, BLOOD: Lipase: 47 U/L (ref 11–51)

## 2023-08-06 LAB — OSMOLALITY, URINE: Osmolality, Ur: 372 mOsm/kg (ref 300–900)

## 2023-08-06 LAB — SODIUM, URINE, RANDOM: Sodium, Ur: 130 mmol/L

## 2023-08-06 MED ORDER — HYDRALAZINE HCL 20 MG/ML IJ SOLN
5.0000 mg | INTRAMUSCULAR | Status: DC | PRN
  Administered 2023-08-07: 5 mg via INTRAVENOUS
  Filled 2023-08-06: qty 1

## 2023-08-06 MED ORDER — SODIUM CHLORIDE 0.9 % IV SOLN: INTRAVENOUS | Status: DC

## 2023-08-06 MED ORDER — ENOXAPARIN SODIUM 30 MG/0.3ML IJ SOSY
30.0000 mg | PREFILLED_SYRINGE | INTRAMUSCULAR | Status: DC
  Administered 2023-08-07 – 2023-08-12 (×6): 30 mg via SUBCUTANEOUS
  Filled 2023-08-06 (×6): qty 0.3

## 2023-08-06 MED ORDER — ACETAMINOPHEN 650 MG RE SUPP: 650.0000 mg | Freq: Four times a day (QID) | RECTAL | Status: DC | PRN

## 2023-08-06 MED ORDER — ONDANSETRON HCL 4 MG PO TABS: 4.0000 mg | ORAL_TABLET | Freq: Four times a day (QID) | ORAL | Status: DC | PRN

## 2023-08-06 MED ORDER — SODIUM CHLORIDE 0.9 % IV SOLN: Freq: Once | INTRAVENOUS | Status: AC

## 2023-08-06 MED ORDER — HYDROCODONE-ACETAMINOPHEN 5-325 MG PO TABS: 1.0000 | ORAL_TABLET | ORAL | Status: DC | PRN

## 2023-08-06 MED ORDER — ONDANSETRON HCL 4 MG/2ML IJ SOLN: 4.0000 mg | Freq: Four times a day (QID) | INTRAMUSCULAR | Status: DC | PRN

## 2023-08-06 MED ORDER — ONDANSETRON 4 MG PO TBDP
4.0000 mg | ORAL_TABLET | Freq: Once | ORAL | Status: AC | PRN
  Administered 2023-08-06: 4 mg via ORAL
  Filled 2023-08-06: qty 1

## 2023-08-06 MED ORDER — ACETAMINOPHEN 325 MG PO TABS: 650.0000 mg | ORAL_TABLET | Freq: Four times a day (QID) | ORAL | Status: DC | PRN

## 2023-08-06 MED ORDER — IOHEXOL 300 MG/ML  SOLN
75.0000 mL | Freq: Once | INTRAMUSCULAR | Status: AC | PRN
  Administered 2023-08-06: 75 mL via INTRAVENOUS

## 2023-08-06 NOTE — Assessment & Plan Note (Signed)
Sodium 123, suspect hypovolemic related to poor oral intake CT abdomen and pelvis does not reveal an etiology Follow-up chest x-ray IV hydration with NS and monitor serum sodium with goal to increase sodium to 8 mEq in the next 24 hours

## 2023-08-06 NOTE — Assessment & Plan Note (Signed)
Parastomal hernia containing nondilated bowel Wound consult for ostomy care

## 2023-08-06 NOTE — Assessment & Plan Note (Signed)
Incidental finding on CT, with no obstructing calculus identified Patient also has microscopic hematuria but otherwise unremarkable UA, no infection Can consider renal ultrasound, urology consult but in view of advanced age of 33, suspect conservative management/surveillance for worsening abdominal pain be recommended Intake output monitoring with bladder scan as needed

## 2023-08-06 NOTE — ED Notes (Addendum)
Medications reviewed with pt daughter whom has them at bedside. Pt daughter requested to give pt her scheduled dose of ambien for sleep. Pt as administered home dose of ambien 10mg  PO. Pt repositioned in bed, c/o cold room temperature. Temperature adjusted in the room for comfort. Pt c/o leg pain, placed pillow underneath legs for comfort. Pt daughter going home for the night, taking pt home meds with her.

## 2023-08-06 NOTE — ED Triage Notes (Signed)
First Nurse Note: Pt arrives via [facility] from home with complaints of HTN and Weakness.  VS with EMS 98.4 102-HR 213/112 90% RA - 4L 95%  20G LFA -4mg  Zofran

## 2023-08-06 NOTE — Assessment & Plan Note (Signed)
Frailty Nutritionist consult, PT consult

## 2023-08-06 NOTE — Assessment & Plan Note (Signed)
Age-indeterminate fracture seen Pain control PT eval

## 2023-08-06 NOTE — H&P (Signed)
History and Physical    Patient: Autumn Johnston XBM:[id number] DOB: [birth year] DOA: 08/06/2023 DOS: the patient was seen and examined on 08/06/2023 PCP: Marguarite Arbour, MD  Patient coming from: Home  Chief Complaint:  Chief Complaint  Patient presents with   Nausea    HPI: Autumn Johnston is a 89 y.o. female with medical history significant for essential hypertension, hyperlipidemia, CKD lllb, anemia of chronic disease, B12 deficiency, colon cancer s/p colostomy with parastomal hernia, who was brought to the ED with concerns for decreased oral intake for several days and decreased urinary input.  She has mild lower abdominal tenderness.  Has no vomiting or diarrhea.  Denies cough, chest pain or shortness of breath. ED Course and data review: BP 208/95, mild tachypnea to 22 with otherwise normal vitals. Labs: Notable for sodium of 123.  Creatinine at baseline at 1.14.  Urinalysis without signs of infection.  CBC normal, likely normal limits. Urine sodium 130 urine osmolality 372, serum osmolality pending EKG, personally viewed and interpreted with sinus rhythm at 91 with nonspecific ST-T wave changes. Chest x-ray pending CT abdomen and pelvis with multiple nonacute findings except for possible new mild compression deformity at L4, further detailed below: MPRESSION: 1. Mild right-sided hydronephrosis of uncertain etiology. No obstructing calculus identified. 2. Cholelithiasis. 3. Stable common bile duct dilatation.  Correlate with lab values. 4. Unchanged left lower quadrant colostomy with large peristomal hernia containing nondilated bowel. 5. New mild compression deformity of L4, age indeterminate. Correlate for point tenderness. 6. Multiple lesions, including left Bosniak I benign renal cyst measuring 1.5 cm. No follow-up imaging is recommended.  Patient started on hydration with NS and given Zofran Hospitalist consulted for admission.     Past Medical History:   Diagnosis Date   CKD (chronic kidney disease) stage 4, GFR 15-29 ml/min (HCC)    Colon cancer (HCC)    Hypertension    Past Surgical History:  Procedure Laterality Date   COLECTOMY WITH COLOSTOMY CREATION/HARTMANN PROCEDURE     INTRAMEDULLARY (IM) NAIL INTERTROCHANTERIC Left 09/08/2021   Procedure: INTRAMEDULLARY (IM) NAIL INTERTROCHANTRIC;  Surgeon: Signa Kell, MD;  Location: [facility] ORS;  Service: Orthopedics;  Laterality: Left;     Prior to Admission medications   Medication Sig Start Date End Date Taking? Authorizing Provider  solifenacin (VESICARE) 10 MG tablet Take 10 mg by mouth daily. 08/04/23 08/03/24 Yes [provider]  ALPRAZolam Prudy Feeler) 0.25 MG tablet Take 1 tablet (0.25 mg total) by mouth 2 (two) times daily as needed. 08/01/21   Marrion Coy, MD  polyethylene glycol (MIRALAX / GLYCOLAX) 17 g packet Take 17 g by mouth daily as needed for mild constipation. 04/02/22   Enedina Finner, MD  sertraline (ZOLOFT) 50 MG tablet Take 50 mg by mouth daily. 05/24/21   [provider]  simvastatin (ZOCOR) 20 MG tablet Take 20 mg by mouth at bedtime. 05/24/21   [provider]  zolpidem (AMBIEN) 10 MG tablet Take 10 mg by mouth at bedtime as needed. 03/18/22   [provider]    Physical Exam: Vitals:   08/06/23 2030  BP: (!) 208/95  Pulse: 88  Resp: (!) 22  Temp: 98.5 F (36.9 C)  TempSrc: Oral  SpO2: 99%   Physical Exam Vitals and nursing note reviewed.  Constitutional:      General: She is not in acute distress.    Appearance: She is underweight.     Comments: Frail-appearing elderly female in no distress.  Awake and alert  HENT:     Head: Normocephalic and atraumatic.  Cardiovascular:     Rate and Rhythm: Normal rate and regular rhythm.     Heart sounds: Normal heart sounds.  Pulmonary:     Effort: Pulmonary effort is normal.     Breath sounds: Normal breath sounds.  Abdominal:     Palpations: Abdomen is soft.     Tenderness: There is no  abdominal tenderness.  Neurological:     Mental Status: Mental status is at baseline.     Labs on Admission: I have personally reviewed following labs and imaging studies  CBC: Recent Labs  Lab 08/06/23 2034  WBC 7.6  HGB 12.7  HCT 37.6  MCV 86.2  PLT 255   Basic Metabolic Panel: Recent Labs  Lab 08/06/23 2034  NA 123*  K 3.5  CL 88*  CO2 24  GLUCOSE 135*  BUN 20  CREATININE 1.14*  CALCIUM 9.0   GFR: CrCl cannot be calculated (Unknown ideal weight.). Liver Function Tests: Recent Labs  Lab 08/06/23 2034  AST 25  ALT 14  ALKPHOS 49  BILITOT 1.0  PROT 7.6  ALBUMIN 4.0   Recent Labs  Lab 08/06/23 2034  LIPASE 47   No results for input(s): "AMMONIA" in the last 168 hours. Coagulation Profile: No results for input(s): "INR", "PROTIME" in the last 168 hours. Cardiac Enzymes: No results for input(s): "CKTOTAL", "CKMB", "CKMBINDEX", "TROPONINI" in the last 168 hours. BNP (last 3 results) No results for input(s): "PROBNP" in the last 8760 hours. HbA1C: No results for input(s): "HGBA1C" in the last 72 hours. CBG: No results for input(s): "GLUCAP" in the last 168 hours. Lipid Profile: No results for input(s): "CHOL", "HDL", "LDLCALC", "TRIG", "CHOLHDL", "LDLDIRECT" in the last 72 hours. Thyroid Function Tests: No results for input(s): "TSH", "T4TOTAL", "FREET4", "T3FREE", "THYROIDAB" in the last 72 hours. Anemia Panel: No results for input(s): "VITAMINB12", "FOLATE", "FERRITIN", "TIBC", "IRON", "RETICCTPCT" in the last 72 hours. Urine analysis:    Component Value Date/Time   COLORURINE STRAW (A) 08/06/2023 2218   APPEARANCEUR CLEAR (A) 08/06/2023 2218   LABSPEC 1.008 08/06/2023 2218   PHURINE 7.0 08/06/2023 2218   GLUCOSEU NEGATIVE 08/06/2023 2218   HGBUR MODERATE (A) 08/06/2023 2218   BILIRUBINUR NEGATIVE 08/06/2023 2218   KETONESUR NEGATIVE 08/06/2023 2218   PROTEINUR 100 (A) 08/06/2023 2218   NITRITE NEGATIVE 08/06/2023 2218   LEUKOCYTESUR  NEGATIVE 08/06/2023 2218    Radiological Exams on Admission: CT ABDOMEN PELVIS W CONTRAST  Result Date: 08/06/2023 CLINICAL DATA:  Abdominal pain EXAM: CT ABDOMEN AND PELVIS WITH CONTRAST TECHNIQUE: Multidetector CT imaging of the abdomen and pelvis was performed using the standard protocol following bolus administration of intravenous contrast. RADIATION DOSE REDUCTION: This exam was performed according to the departmental dose-optimization program which includes automated exposure control, adjustment of the mA and/or kV according to patient size and/or use of iterative reconstruction technique. CONTRAST:  75mL OMNIPAQUE IOHEXOL 300 MG/ML  SOLN COMPARISON:  CT abdomen and pelvis 09/10/2021 FINDINGS: Lower chest: Atelectasis and scarring is noted in the lung bases. Hepatobiliary: Gallstones are present. Common bile duct is dilated measuring 11 mm similar to prior. Hepatic cysts are present. The largest is in the right lobe of the liver measuring 3.1 cm. Pancreas: Unremarkable. No pancreatic ductal dilatation or surrounding inflammatory changes. Spleen: Normal in size without focal abnormality. Adrenals/Urinary Tract: Limited evaluation in the pelvis secondary to streak artifact. The bladder is distended, but grossly within normal limits. There is mild right-sided hydronephrosis without  obstructing calculus identified. There is a left renal cortical cyst measuring 15 mm. There are additional rounded cortical hypodensities in the kidneys which are too small to characterize, also likely cysts. There some calcifications in the right adrenal gland, unchanged. Left adrenal gland is within normal limits. Stomach/Bowel: There surgical clips in the posterior pelvis. There is a left lower quadrant colostomy with large peristomal hernia containing nondilated bowel similar to the prior study. There is no bowel obstruction, pneumatosis or free air. Appendix is not visualized. Stomach is within normal limits.  Vascular/Lymphatic: Aortic atherosclerosis. No enlarged abdominal or pelvic lymph nodes. Reproductive: Uterus and adnexa are not well evaluated secondary to streak artifact in the pelvis. Other: No ascites. Musculoskeletal: Bones are osteopenic. Mild compression deformity of L4 is new from prior. Left-sided hip screw is present. IMPRESSION: 1. Mild right-sided hydronephrosis of uncertain etiology. No obstructing calculus identified. 2. Cholelithiasis. 3. Stable common bile duct dilatation.  Correlate with lab values. 4. Unchanged left lower quadrant colostomy with large peristomal hernia containing nondilated bowel. 5. New mild compression deformity of L4, age indeterminate. Correlate for point tenderness. 6. Multiple lesions, including left Bosniak I benign renal cyst measuring 1.5 cm. No follow-up imaging is recommended. JACR 2018 Feb; 264-273, Management of the Incidental Renal Mass on CT, [employer] 2021; 814-848, Bosniak Classification of Cystic Renal Masses, Version 2019. Aortic Atherosclerosis (ICD10-I70.0). Electronically Signed   By: Darliss Cheney M.D.   On: 08/06/2023 23:27     Data Reviewed: Relevant notes from primary care and specialist visits, past discharge summaries as available in EHR, including Care Everywhere. Prior diagnostic testing as pertinent to current admission diagnoses Updated medications and problem lists for reconciliation ED course, including vitals, labs, imaging, treatment and response to treatment Triage notes, nursing and pharmacy notes and ED provider's notes Notable results as noted in HPI   Assessment and Plan: * Hyponatremia Sodium 123, suspect hypovolemic related to poor oral intake CT abdomen and pelvis does not reveal an etiology Follow-up chest x-ray IV hydration with NS and monitor serum sodium with goal to increase sodium to 8 mEq in the next 24 hours  Hypertensive urgency BP 208/95 on admission No antihypertensive seen on home med  list Hydralazine IV as needed for BP control  Hydronephrosis of right kidney Incidental finding on CT, with no obstructing calculus identified Patient also has microscopic hematuria but otherwise unremarkable UA, no infection Can consider renal ultrasound, urology consult but in view of advanced age of 38, suspect conservative management/surveillance for worsening abdominal pain be recommended Intake output monitoring with bladder scan as needed  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 Observation status is judged to be reasonable and necessary in order to provide the required intensity of service to ensure the patient's safety. The patient's presenting symptoms, physical exam findings, and initial radiographic and laboratory data in the context of their medical condition is felt to place them at decreased risk for further clinical deterioration. Furthermore, it is anticipated that the patient will  be medically stable for discharge from the hospital within 2 midnights of admission.   Author: Andris Baumann, MD 08/06/2023 11:39 PM  For on call review [email or web address].

## 2023-08-06 NOTE — Assessment & Plan Note (Signed)
BP 208/95 on admission No antihypertensive seen on home med list Hydralazine IV as needed for BP control

## 2023-08-06 NOTE — ED Triage Notes (Signed)
Pt and daughter present during triage. Pt c/o nausea, and not urinating like normal. Daughter says that she has not been eating and drinking. Pt does deny pain in her stomach or chest. Pt has colostomy. Pt reports she feels some tingling at the bottom of her feet. Hx of HTN, but not on antihypertensives.

## 2023-08-06 NOTE — Assessment & Plan Note (Signed)
Creatinine at baseline at 1.17

## 2023-08-06 NOTE — ED Provider Notes (Signed)
[facility] Provider Note    Event Date/Time   First MD Initiated Contact with Patient 08/06/23 2126     (approximate)   History   Nausea   HPI  Autumn Johnston is a 89 y.o. female  with h/o HTN, HLD, CKD, anemia, here with generalized weakness. Pt reportedly has not been eating/drinking much, if at all, for the last 1-2 weeks. She has been c/o feeling "weak" and "awful" and refusing to eat. She has also been c/o lower abdominal discomfort and burning with urination, and has been "focused" on having difficulty urinating. She has had decreased but continued ostomy output. No known fevers.       Physical Exam   Triage Vital Signs: ED Triage Vitals  Encounter Vitals Group     BP 08/06/23 2030 (!) 208/95     Systolic BP Percentile --      Diastolic BP Percentile --      Pulse Rate 08/06/23 2030 88     Resp 08/06/23 2030 (!) 22     Temp 08/06/23 2030 98.5 F (36.9 C)     Temp Source 08/06/23 2030 Oral     SpO2 08/06/23 2030 99 %     Weight --      Height --      Head Circumference --      Peak Flow --      Pain Score 08/06/23 2029 0     Pain Loc --      Pain Education --      Exclude from Growth Chart --     Most recent vital signs: Vitals:   08/06/23 2030  BP: (!) 208/95  Pulse: 88  Resp: (!) 22  Temp: 98.5 F (36.9 C)  SpO2: 99%     General: Awake, no distress.  CV:  Good peripheral perfusion. RRR.  Resp:  Normal work of breathing. Lungs clear to auscultation bilaterally. Abd:  No distention. Moderate lower abdominal tenderness, no rebound or guarding. Lower abdominal ostomy with large ventral/abdominal wall hernia. Other:  Dry MM.   ED Results / Procedures / Treatments   Labs (all labs ordered are listed, but only abnormal results are displayed) Labs Reviewed  COMPREHENSIVE METABOLIC PANEL - Abnormal; Notable for the following components:      Result Value   Sodium 123 (*)    Chloride 88 (*)    Glucose, Bld 135 (*)     Creatinine, Ser 1.14 (*)    GFR, Estimated 44 (*)    All other components within normal limits  URINALYSIS, ROUTINE W REFLEX MICROSCOPIC - Abnormal; Notable for the following components:   Color, Urine STRAW (*)    APPearance CLEAR (*)    Hgb urine dipstick MODERATE (*)    Protein, ur 100 (*)    All other components within normal limits  TROPONIN I (HIGH SENSITIVITY) - Abnormal; Notable for the following components:   Troponin I (High Sensitivity) 46 (*)    All other components within normal limits  LIPASE, BLOOD  CBC  SODIUM, URINE, RANDOM  OSMOLALITY, URINE  OSMOLALITY     EKG Normal sinus rhythm, VR 91. PR 134, QRS 84, QTc 455. No acute ST elevaitons or depressions. No ischemia or infarct.   RADIOLOGY CT A/P: Pending   I also independently reviewed and agree with radiologist interpretations.   PROCEDURES:  Critical Care performed: No  .1-3 Lead EKG Interpretation  Performed by: Shaune Pollack, MD Authorized by: Shaune Pollack, MD  Interpretation: normal     ECG rate:  80-90   ECG rate assessment: normal     Rhythm: sinus rhythm     Ectopy: none     Conduction: normal   Comments:     Indication: Weakness     MEDICATIONS ORDERED IN ED: Medications  ondansetron (ZOFRAN-ODT) disintegrating tablet 4 mg (4 mg Oral Given 08/06/23 2036)  0.9 %  sodium chloride infusion ( Intravenous New Bag/Given 08/06/23 2219)  iohexol (OMNIPAQUE) 300 MG/ML solution 75 mL (75 mLs Intravenous Contrast Given 08/06/23 2254)     IMPRESSION / MDM / ASSESSMENT AND PLAN / ED COURSE  I reviewed the triage vital signs and the nursing notes.                              Differential diagnosis includes, but is not limited to, generalized deconditioning, dehydration, AKI, hyponatremia, obstruction, UTI, polypharmacy  Patient's presentation is most consistent with acute presentation with potential threat to life or bodily function.  The patient is on the cardiac monitor to  evaluate for evidence of arrhythmia and/or significant heart rate changes  89 yo F with h/o HTN, HLD, CKD, colostomy, ventral hernia, here with nausea, loss of appetite and generalized weakness. Pt also c/o lower abdominal discomfort and difficulty w/ urinating. CBC shows no leukocytosis or anemia. CMP reveals significant hyponatremia, ow is largely unremarkable. UA with hematuria which has been noted before, but no signs of UTI. CT scan pending. Will start normal saline infusion for her likely hypovolemic hyponatremia, and plan to admit.  Discussed with family who is in agreement with admission. They confirm pt is DNR.  FINAL CLINICAL IMPRESSION(S) / ED DIAGNOSES   Final diagnoses:  Hyponatremia     Rx / DC Orders   ED Discharge Orders     None        Note:  This document was prepared using Dragon voice recognition software and may include unintentional dictation errors.   Shaune Pollack, MD 08/06/23 [phone]

## 2023-08-07 DIAGNOSIS — Z1152 Encounter for screening for COVID-19: Secondary | ICD-10-CM | POA: Diagnosis not present

## 2023-08-07 DIAGNOSIS — I129 Hypertensive chronic kidney disease with stage 1 through stage 4 chronic kidney disease, or unspecified chronic kidney disease: Secondary | ICD-10-CM | POA: Diagnosis present

## 2023-08-07 DIAGNOSIS — N1330 Unspecified hydronephrosis: Secondary | ICD-10-CM | POA: Diagnosis present

## 2023-08-07 DIAGNOSIS — E222 Syndrome of inappropriate secretion of antidiuretic hormone: Secondary | ICD-10-CM | POA: Diagnosis present

## 2023-08-07 DIAGNOSIS — Z681 Body mass index (BMI) 19 or less, adult: Secondary | ICD-10-CM | POA: Diagnosis not present

## 2023-08-07 DIAGNOSIS — K5900 Constipation, unspecified: Secondary | ICD-10-CM | POA: Diagnosis not present

## 2023-08-07 DIAGNOSIS — E871 Hypo-osmolality and hyponatremia: Secondary | ICD-10-CM | POA: Diagnosis present

## 2023-08-07 DIAGNOSIS — R059 Cough, unspecified: Secondary | ICD-10-CM | POA: Diagnosis present

## 2023-08-07 DIAGNOSIS — F0390 Unspecified dementia without behavioral disturbance: Secondary | ICD-10-CM | POA: Diagnosis present

## 2023-08-07 DIAGNOSIS — N1832 Chronic kidney disease, stage 3b: Secondary | ICD-10-CM | POA: Diagnosis present

## 2023-08-07 DIAGNOSIS — T43224A Poisoning by selective serotonin reuptake inhibitors, undetermined, initial encounter: Secondary | ICD-10-CM | POA: Diagnosis present

## 2023-08-07 DIAGNOSIS — M4856XA Collapsed vertebra, not elsewhere classified, lumbar region, initial encounter for fracture: Secondary | ICD-10-CM | POA: Diagnosis present

## 2023-08-07 DIAGNOSIS — E785 Hyperlipidemia, unspecified: Secondary | ICD-10-CM | POA: Diagnosis present

## 2023-08-07 DIAGNOSIS — K435 Parastomal hernia without obstruction or  gangrene: Secondary | ICD-10-CM | POA: Diagnosis present

## 2023-08-07 DIAGNOSIS — E538 Deficiency of other specified B group vitamins: Secondary | ICD-10-CM | POA: Diagnosis present

## 2023-08-07 DIAGNOSIS — C189 Malignant neoplasm of colon, unspecified: Secondary | ICD-10-CM | POA: Diagnosis present

## 2023-08-07 DIAGNOSIS — I160 Hypertensive urgency: Secondary | ICD-10-CM | POA: Diagnosis present

## 2023-08-07 DIAGNOSIS — Z933 Colostomy status: Secondary | ICD-10-CM | POA: Diagnosis not present

## 2023-08-07 DIAGNOSIS — Z79899 Other long term (current) drug therapy: Secondary | ICD-10-CM | POA: Diagnosis not present

## 2023-08-07 DIAGNOSIS — E46 Unspecified protein-calorie malnutrition: Secondary | ICD-10-CM | POA: Diagnosis present

## 2023-08-07 DIAGNOSIS — Z66 Do not resuscitate: Secondary | ICD-10-CM | POA: Diagnosis present

## 2023-08-07 DIAGNOSIS — D638 Anemia in other chronic diseases classified elsewhere: Secondary | ICD-10-CM | POA: Diagnosis present

## 2023-08-07 LAB — MAGNESIUM: Magnesium: 1.8 mg/dL (ref 1.7–2.4)

## 2023-08-07 LAB — BASIC METABOLIC PANEL
Anion gap: 10 (ref 5–15)
Anion gap: 7 (ref 5–15)
Anion gap: 8 (ref 5–15)
Anion gap: 10 (ref 5–15)
BUN: 17 mg/dL (ref 8–23)
BUN: 17 mg/dL (ref 8–23)
BUN: 16 mg/dL (ref 8–23)
BUN: 19 mg/dL (ref 8–23)
CO2: 22 mmol/L (ref 22–32)
CO2: 23 mmol/L (ref 22–32)
CO2: 22 mmol/L (ref 22–32)
CO2: 23 mmol/L (ref 22–32)
Calcium: 8.6 mg/dL — ABNORMAL LOW (ref 8.9–10.3)
Calcium: 8.2 mg/dL — ABNORMAL LOW (ref 8.9–10.3)
Calcium: 8.4 mg/dL — ABNORMAL LOW (ref 8.9–10.3)
Calcium: 8.5 mg/dL — ABNORMAL LOW (ref 8.9–10.3)
Chloride: 90 mmol/L — ABNORMAL LOW (ref 98–111)
Chloride: 94 mmol/L — ABNORMAL LOW (ref 98–111)
Chloride: 94 mmol/L — ABNORMAL LOW (ref 98–111)
Chloride: 93 mmol/L — ABNORMAL LOW (ref 98–111)
Creatinine, Ser: 1.06 mg/dL — ABNORMAL HIGH (ref 0.44–1.00)
Creatinine, Ser: 1.13 mg/dL — ABNORMAL HIGH (ref 0.44–1.00)
Creatinine, Ser: 1.03 mg/dL — ABNORMAL HIGH (ref 0.44–1.00)
Creatinine, Ser: 1.21 mg/dL — ABNORMAL HIGH (ref 0.44–1.00)
GFR, Estimated: 47 mL/min — ABNORMAL LOW (ref >60)
GFR, Estimated: 44 mL/min — ABNORMAL LOW (ref >60)
GFR, Estimated: 49 mL/min — ABNORMAL LOW (ref >60)
GFR, Estimated: 41 mL/min — ABNORMAL LOW (ref >60)
Glucose, Bld: 122 mg/dL — ABNORMAL HIGH (ref 70–99)
Glucose, Bld: 100 mg/dL — ABNORMAL HIGH (ref 70–99)
Glucose, Bld: 89 mg/dL (ref 70–99)
Glucose, Bld: 108 mg/dL — ABNORMAL HIGH (ref 70–99)
Potassium: 3.8 mmol/L (ref 3.5–5.1)
Potassium: 3.6 mmol/L (ref 3.5–5.1)
Potassium: 4.2 mmol/L (ref 3.5–5.1)
Potassium: 3.6 mmol/L (ref 3.5–5.1)
Sodium: 123 mmol/L — ABNORMAL LOW (ref 135–145)
Sodium: 124 mmol/L — ABNORMAL LOW (ref 135–145)
Sodium: 124 mmol/L — ABNORMAL LOW (ref 135–145)
Sodium: 126 mmol/L — ABNORMAL LOW (ref 135–145)

## 2023-08-07 LAB — PHOSPHORUS: Phosphorus: 3.1 mg/dL (ref 2.5–4.6)

## 2023-08-07 LAB — OSMOLALITY: Osmolality: 274 mOsm/kg — ABNORMAL LOW (ref 275–295)

## 2023-08-07 LAB — SARS CORONAVIRUS 2 BY RT PCR: SARS Coronavirus 2 by RT PCR: NEGATIVE

## 2023-08-07 MED ORDER — ADULT MULTIVITAMIN W/MINERALS CH
1.0000 | ORAL_TABLET | Freq: Every day | ORAL | Status: DC
  Administered 2023-08-08 – 2023-08-12 (×5): 1 via ORAL
  Filled 2023-08-07 (×5): qty 1

## 2023-08-07 MED ORDER — SODIUM CHLORIDE 1 G PO TABS
2.0000 g | ORAL_TABLET | Freq: Three times a day (TID) | ORAL | Status: AC
  Administered 2023-08-07 – 2023-08-09 (×6): 2 g via ORAL
  Filled 2023-08-07 (×7): qty 2

## 2023-08-07 MED ORDER — AMLODIPINE BESYLATE 10 MG PO TABS: 10.0000 mg | ORAL_TABLET | Freq: Every day | ORAL | Status: DC

## 2023-08-07 MED ORDER — HYDRALAZINE HCL 50 MG PO TABS: 50.0000 mg | ORAL_TABLET | Freq: Four times a day (QID) | ORAL | Status: DC | PRN

## 2023-08-07 MED ORDER — HYDRALAZINE HCL 20 MG/ML IJ SOLN
10.0000 mg | Freq: Four times a day (QID) | INTRAMUSCULAR | Status: DC | PRN
  Administered 2023-08-08 – 2023-08-11 (×2): 10 mg via INTRAVENOUS
  Filled 2023-08-07 (×2): qty 1

## 2023-08-07 MED ORDER — ZOLPIDEM TARTRATE 5 MG PO TABS
5.0000 mg | ORAL_TABLET | Freq: Every evening | ORAL | Status: DC | PRN
  Administered 2023-08-07 – 2023-08-11 (×5): 5 mg via ORAL
  Filled 2023-08-07 (×5): qty 1

## 2023-08-07 MED ORDER — ALPRAZOLAM 0.25 MG PO TABS
0.2500 mg | ORAL_TABLET | Freq: Two times a day (BID) | ORAL | Status: DC | PRN
  Administered 2023-08-07: 0.25 mg via ORAL
  Filled 2023-08-07: qty 1

## 2023-08-07 MED ORDER — AMLODIPINE BESYLATE 5 MG PO TABS
5.0000 mg | ORAL_TABLET | Freq: Every day | ORAL | Status: DC
  Administered 2023-08-07 – 2023-08-09 (×3): 5 mg via ORAL
  Filled 2023-08-07 (×4): qty 1

## 2023-08-07 MED ORDER — ENSURE ENLIVE PO LIQD
237.0000 mL | Freq: Two times a day (BID) | ORAL | Status: DC
  Administered 2023-08-08 – 2023-08-12 (×6): 237 mL via ORAL

## 2023-08-07 NOTE — Evaluation (Signed)
Occupational Therapy Evaluation Patient Details Name: Autumn Johnston MRN: [id number] DOB: 28-Dec-1921 Today's Date: 08/07/2023   History of Present Illness Annessa Reichow is a 89 y.o. female with medical history significant for essential hypertension, hyperlipidemia, CKD lllb, anemia of chronic disease, B12 deficiency, colon cancer s/p colostomy with parastomal hernia, who was brought to the ED with concerns for decreased oral intake for several days and decreased urinary input.   Clinical Impression   Ms Wadell was seen for OT evaluation this date. Prior to hospital admission, pt was MOD I using RW. Pt lives alone, reports her children rotate staying with her for a week at a time. Pt currently requires SUPERVISION + RW for toilet t/f and standing grooming tasks. Requires seated rest break citing fatigue, tolerates ~20 ft x2. Pt appears near baseline, will maintain on caseload to prevent deconditioning. Upon hospital discharge, recommend no OT follow up.    If plan is discharge home, recommend the following: Direct supervision/assist for financial management    Functional Status Assessment  Patient has had a recent decline in their functional status and demonstrates the ability to make significant improvements in function in a reasonable and predictable amount of time.  Equipment Recommendations  BSC/3in1    Recommendations for Other Services       Precautions / Restrictions Precautions Precautions: Fall Restrictions Weight Bearing Restrictions: No      Mobility Bed Mobility Overal bed mobility: Modified Independent                  Transfers Overall transfer level: Needs assistance Equipment used: Rolling walker (2 wheels) Transfers: Sit to/from Stand Sit to Stand: Supervision                  Balance Overall balance assessment: Needs assistance Sitting-balance support: No upper extremity supported, Feet supported Sitting balance-Leahy Scale:  Good     Standing balance support: Single extremity supported, During functional activity Standing balance-Leahy Scale: Fair                             ADL either performed or assessed with clinical judgement   ADL Overall ADL's : Needs assistance/impaired                                       General ADL Comments: SUPERVISION + RW for toilet t/f and standing grooming tasks.      Pertinent Vitals/Pain Pain Assessment Pain Assessment: No/denies pain     Extremity/Trunk Assessment Upper Extremity Assessment Upper Extremity Assessment: Overall WFL for tasks assessed   Lower Extremity Assessment Lower Extremity Assessment: Generalized weakness       Communication Communication Communication: No apparent difficulties   Cognition Arousal: Alert Behavior During Therapy: WFL for tasks assessed/performed Overall Cognitive Status: Within Functional Limits for tasks assessed                                 General Comments: age appropriate cognitive delays. Answers all questions appropriately                Home Living Family/patient expects to be discharged to:: Private residence Living Arrangements: Alone Available Help at Discharge: Family;Available 24 hours/day Type of Home: House Home Access: Level entry     Home Layout: Two level Alternate  Level Stairs-Number of Steps: 3-4 steps inside home from living room to kitchen/bedrooms with narrow B rails             Home Equipment: Rolling Walker (2 wheels)   Additional Comments: reports spouse recently passed away and her children have been alternating weeks to stay with her.      Prior Functioning/Environment Prior Level of Function : Needs assist               ADLs Comments: reports children assist with IADLs        OT Problem List: Decreased strength;Decreased activity tolerance      OT Treatment/Interventions: Self-care/ADL training;Therapeutic  exercise;Energy conservation;DME and/or AE instruction;Therapeutic activities    OT Goals(Current goals can be found in the care plan section) Acute Rehab OT Goals Patient Stated Goal: to go home OT Goal Formulation: With patient Time For Goal Achievement: 08/21/23 Potential to Achieve Goals: Good ADL Goals Pt Will Perform Grooming: with modified independence;standing Pt Will Perform Lower Body Dressing: with modified independence;sit to/from stand Pt Will Transfer to Toilet: with modified independence;ambulating;regular height toilet  OT Frequency: Min 1X/week    Co-evaluation              AM-PAC OT "6 Clicks" Daily Activity     Outcome Measure Help from another person eating meals?: None Help from another person taking care of personal grooming?: None Help from another person toileting, which includes using toliet, bedpan, or urinal?: None Help from another person bathing (including washing, rinsing, drying)?: A Little Help from another person to put on and taking off regular upper body clothing?: None Help from another person to put on and taking off regular lower body clothing?: A Little 6 Click Score: 22   End of Session    Activity Tolerance: Patient tolerated treatment well Patient left: in bed;with call bell/phone within reach;with bed alarm set  OT Visit Diagnosis: Unsteadiness on feet (R26.81)                Time: 1610-9604 OT Time Calculation (min): 17 min Charges:  OT General Charges $OT Visit: 1 Visit OT Evaluation $OT Eval Low Complexity: 1 Low  Kathie Dike, M.S. OTR/L  08/07/23, 12:47 PM  ascom [phone]

## 2023-08-07 NOTE — Evaluation (Signed)
Physical Therapy Evaluation Patient Details Name: Autumn Johnston MRN: [id number] DOB: 11/09/1922 Today's Date: 08/07/2023  History of Present Illness  Autumn Johnston is a 89 y.o. female with medical history significant for essential hypertension, hyperlipidemia, CKD lllb, anemia of chronic disease, B12 deficiency, colon cancer s/p colostomy with parastomal hernia, who was brought to the ED with concerns for decreased oral intake for several days and decreased urinary input.   Clinical Impression  Pt alert agreeable to PT, denied pain. Per pt PLOF she is ambulatory with a RW and family rotates staying with her. Overall the pt required CGA for all mobility tasks. Noted for incontinence, majority of session addressed this concern, RN notified. She was able to ambulate ~61ft with RW and CGA, close chair follow. Pt expressed fatigue at end of session.  The patient demonstrated deficits (see "PT Problem List") that impede the patient's functional abilities, safety, and mobility and would benefit from skilled PT intervention.          If plan is discharge home, recommend the following: A little help with bathing/dressing/bathroom;Assistance with cooking/housework;Assist for transportation;Help with stairs or ramp for entrance   Can travel by private vehicle        Equipment Recommendations Rolling walker (2 wheels)  Recommendations for Other Services       Functional Status Assessment Patient has had a recent decline in their functional status and demonstrates the ability to make significant improvements in function in a reasonable and predictable amount of time.     Precautions / Restrictions Precautions Precautions: Fall Restrictions Weight Bearing Restrictions: No      Mobility  Bed Mobility                    Transfers Overall transfer level: Needs assistance Equipment used: Rolling walker (2 wheels) Transfers: Sit to/from Stand, Bed to  chair/wheelchair/BSC Sit to Stand: Contact guard assist   Step pivot transfers: Contact guard assist            Ambulation/Gait Ambulation/Gait assistance: Contact guard assist Gait Distance (Feet): 25 Feet Assistive device: Rolling walker (2 wheels)   Gait velocity: decreased     General Gait Details: pt fatigued quickly, some difficulty navigating environment (RW running into obstacles)  Stairs            Wheelchair Mobility     Tilt Bed    Modified Rankin (Stroke Patients Only)       Balance Overall balance assessment: Needs assistance Sitting-balance support: No upper extremity supported, Feet supported Sitting balance-Leahy Scale: Good     Standing balance support: Single extremity supported, During functional activity Standing balance-Leahy Scale: Fair                               Pertinent Vitals/Pain Pain Assessment Pain Assessment: No/denies pain    Home Living Family/patient expects to be discharged to:: Private residence Living Arrangements: Alone Available Help at Discharge: Family;Available 24 hours/day Type of Home: House Home Access: Level entry     Alternate Level Stairs-Number of Steps: 3-4 steps inside home from living room to kitchen/bedrooms with narrow B rails Home Layout: Two level Home Equipment: Rolling Walker (2 wheels) Additional Comments: reports spouse recently passed away and her children have been alternating weeks to stay with her.    Prior Function Prior Level of Function : Needs assist  ADLs Comments: reports children assist with IADLs     Extremity/Trunk Assessment   Upper Extremity Assessment Upper Extremity Assessment: Defer to OT evaluation    Lower Extremity Assessment Lower Extremity Assessment: Generalized weakness       Communication   Communication Communication: No apparent difficulties  Cognition Arousal: Alert Behavior During Therapy: WFL for tasks  assessed/performed Overall Cognitive Status: Within Functional Limits for tasks assessed                                 General Comments: age appropriate cognitive delays. Answers all questions appropriately        General Comments      Exercises     Assessment/Plan    PT Assessment Patient needs continued PT services  PT Problem List Decreased strength;Decreased activity tolerance;Decreased balance;Decreased mobility       PT Treatment Interventions DME instruction;Gait training;Neuromuscular re-education;Stair training;Therapeutic activities;Functional mobility training;Patient/family education;Therapeutic exercise;Balance training    PT Goals (Current goals can be found in the Care Plan section)  Acute Rehab PT Goals Patient Stated Goal: to go home PT Goal Formulation: With patient Time For Goal Achievement: 08/21/23 Potential to Achieve Goals: Good    Frequency Min 1X/week     Co-evaluation               AM-PAC PT "6 Clicks" Mobility  Outcome Measure Help needed turning from your back to your side while in a flat bed without using bedrails?: None Help needed moving from lying on your back to sitting on the side of a flat bed without using bedrails?: None Help needed moving to and from a bed to a chair (including a wheelchair)?: A Little Help needed standing up from a chair using your arms (e.g., wheelchair or bedside chair)?: A Little Help needed to walk in hospital room?: A Little Help needed climbing 3-5 steps with a railing? : A Little 6 Click Score: 20    End of Session Equipment Utilized During Treatment: Gait belt Activity Tolerance: Patient tolerated treatment well Patient left: in chair;with call bell/phone within reach;with chair alarm set;with family/visitor present Nurse Communication: Mobility status PT Visit Diagnosis: Other abnormalities of gait and mobility (R26.89)    Time: 1610-9604 PT Time Calculation (min) (ACUTE  ONLY): 38 min   Charges:   PT Evaluation $PT Eval Low Complexity: 1 Low PT Treatments $Therapeutic Activity: 23-37 mins PT General Charges $$ ACUTE PT VISIT: 1 Visit        Olga Coaster PT, DPT 4:00 PM,08/07/23

## 2023-08-07 NOTE — Plan of Care (Signed)
°  Problem: Clinical Measurements: Goal: Will remain free from infection Outcome: Progressing   Problem: Clinical Measurements: Goal: Respiratory complications will improve Outcome: Progressing   Problem: Clinical Measurements: Goal: Cardiovascular complication will be avoided Outcome: Progressing   Problem: Elimination: Goal: Will not experience complications related to urinary retention Outcome: Progressing   Problem: Pain Managment: Goal: General experience of comfort will improve Outcome: Progressing   Problem: Safety: Goal: Ability to remain free from injury will improve Outcome: Progressing

## 2023-08-07 NOTE — TOC Progression Note (Signed)
Transition of Care [facility]) - Progression Note    Patient Details  Name: Autumn Johnston MRN: [id number] Date of Birth: 1922/02/03  Transition of Care [facility]) CM/SW Contact  Bing Quarry, RN Phone Number: 08/07/2023, 11:19 AM  Clinical Narrative: 8/17: New admit to unit. 89 yo female presented to [facility] ED w/ daughter from home per triage notes for HTN/weakness. TOC for potential SNF placement. No evals yet, ongoing medical treatment. Was at [facility] in 2022 and has HH via [facility] in 2023 per [employer]. Seen at [facility].  Dx: w/Low Na.  PMH: HTN/HLD/Anemia, & weakness. TOC possible SNF. Wound consult: Completed for stoma care.  PCP: Osborne Oman ContactRiley Kill (Daughter) [phone] (Mobile)     Gabriel Cirri MSN RN CM  Transitions of Care Department [facility] [phone] Weekends Only         Expected Discharge Plan and Services                                               Social Determinants of Health (SDOH) Interventions SDOH Screenings   Tobacco Use: Low Risk  (08/04/2023)   Received from [facility]    Readmission Risk Interventions    03/31/2022   11:40 AM 09/17/2021    9:37 AM  Readmission Risk Prevention Plan  Transportation Screening Complete Complete  PCP or Specialist Appt within 3-5 Days Complete   HRI or Home Care Consult Complete   Social Work Consult for Recovery Care Planning/Counseling Complete   Palliative Care Screening Not Applicable   Medication Review Oceanographer) Complete Complete  PCP or Specialist appointment within 3-5 days of discharge  Complete  HRI or Home Care Consult  Complete  SW Recovery Care/Counseling Consult  Not Complete  SW Consult Not Complete Comments  RNCM assigned to case  Palliative Care Screening  Not Applicable  Comments  na  Skilled Nursing Facility  Complete

## 2023-08-07 NOTE — Progress Notes (Signed)
Initial Nutrition Assessment  DOCUMENTATION CODES:   Underweight  INTERVENTION:   Ensure Enlive po BID, each supplement provides 350 kcal and 20 grams of protein.  Magic cup TID with meals, each supplement provides 290 kcal and 9 grams of protein  MVI po daily   Pt at high refeed risk; recommend monitor potassium, magnesium and phosphorus labs daily until stable  Daily weights   NUTRITION DIAGNOSIS:   Inadequate oral intake related to acute illness as evidenced by per patient/family report.  GOAL:   Patient will meet greater than or equal to 90% of their needs  MONITOR:   PO intake, Supplement acceptance, Labs, Weight trends, Skin, I & O's  REASON FOR ASSESSMENT:   Consult Assessment of nutrition requirement/status  ASSESSMENT:   89 y/o female with h/o CKD IV, HTN, lumbar fracture and colon cancer s/p colostomy with parastomal hernia (containing bowel) who is admittd with hydronephrosis of right kidney.  RD working remotely.  Unable to speak with pt via phone. Per chart review, family reported pt with poor appetite and oral intake for 1-2 weeks pta. Pt documented to be eating 30-90% of meals in hospital. RD will add supplements and MVI to help pt meet her estimated needs. Pt is at high refeed risk. Per chart, pt is down 7lbs(7%) over the past 7 months. RD suspects pt with malnutrition but unable to diagnose at this time. RD will obtain nutrition related history and exam at follow up.   Medications reviewed and include: lovenox, NaCl tabs, NaCl @100ml /hr   Labs reviewed: Na 126(L), K 3.6 wnl, creat 1.21(H), P 3.1 wnl, Mg 1.8 wnl  NUTRITION - FOCUSED PHYSICAL EXAM: Unable to perform at this time   Diet Order:   Diet Order             Diet regular Fluid consistency: Thin  Diet effective now                  EDUCATION NEEDS:   No education needs have been identified at this time  Skin:  Skin Assessment: Reviewed RN Assessment (ecchymosis)  Last BM:   colostomy  Height:   Ht Readings from Last 1 Encounters:  08/07/23 5\' 2"  (1.575 m)    Weight:   Wt Readings from Last 1 Encounters:  08/07/23 44 kg    Ideal Body Weight:  50 kg  BMI:  Body mass index is 17.74 kg/m.  Estimated Nutritional Needs:   Kcal:  1100-1300kcal/day  Protein:  55-65g/day  Fluid:  1.1-1.3L/day  Betsey Holiday MS, RD, LDN Please refer to [facility] for RD and/or RD on-call/weekend/after hours pager

## 2023-08-07 NOTE — ED Notes (Signed)
 Pt is asleep, RR and WOB WNL, NAD. No needs identified. Call light within reach, side rails up x2, bed locked and in the lowest position.

## 2023-08-07 NOTE — Consult Note (Signed)
WOC Nurse ostomy consult note Stoma type/location: LLQ colostomy with parastomal hernia Consult is performed remotely after review of medical record and after consultation with Bedside RN caring for patient today, M. Dunlap. Her assistance is appreciated. Stomal assessment/size: Not measured today Peristomal assessment: Not seen today, pouching system is intact Treatment options for stomal/peristomal skin: skin barrier ring Output: brown stool Ostomy pouching: 2pc. Pouching system that is unfamiliar to staff. Patient obtains via mail order supply company by the name of [employer]. There are no order numbers indicated on the supply so no crosswalk to in house supply can be provided. Education provided: Staff instructed that I will provide Hart [employer] #s for 2 piece pouching system used in our formulary and also that if a family member is able to bring in pouching supplies for the patient that she is able to use her own if desired. Enrolled patient in [employer] DC program: No. As noted above, patient is established with a supplier.  Order Supplies: 2-piece, 2 and 3/4 inch ostomy pouching system: Pouch is [employer], Skin barrier is Hart [employer] #2, skin barrier ring (stretch to fit around stoma prior to pouching) is Hart Rochester [id number].  WOC nursing team will not follow, but will remain available to this patient, the nursing and medical teams.  Please re-consult if needed.  Thank you for inviting [place] to participate in this patient's Plan of Care.  Ladona Mow, MSN, RN, CNS, GNP, Leda Min, [employer], [employer] phone:  [phone]

## 2023-08-07 NOTE — Progress Notes (Signed)
[facility] Progress Note  Patient: Autumn Johnston    WJX:[id number]  DOA: 08/06/2023     Date of Service: the patient was seen and examined on 08/07/2023  Chief Complaint  Patient presents with   Nausea   Brief hospital course: Maguire Meola is a 89 y.o. female with medical history significant for essential hypertension, hyperlipidemia, CKD lllb, anemia of chronic disease, B12 deficiency, colon cancer s/p colostomy with parastomal hernia, who was brought to the ED with concerns for decreased oral intake for several days and decreased urinary input.  She has mild lower abdominal tenderness.  Has no vomiting or diarrhea.  Denies cough, chest pain or shortness of breath. ED Course and data review: BP 208/95, mild tachypnea to 22 with otherwise normal vitals. Labs: Notable for sodium of 123.  Creatinine at baseline at 1.14.  Urinalysis without signs of infection.  CBC normal, likely normal limits. Urine sodium 130 urine osmolality 372, serum osmolality pending EKG, personally viewed and interpreted with sinus rhythm at 91 with nonspecific ST-T wave changes. Chest x-ray pending CT abdomen and pelvis with multiple nonacute findings except for possible new mild compression deformity at L4, further detailed below: MPRESSION:  1. Mild right-sided hydronephrosis of uncertain etiology. No obstructing calculus identified. 2. Cholelithiasis. 3. Stable common bile duct dilatation.  Correlate with lab values. 4. Unchanged left lower quadrant colostomy with large peristomal hernia containing nondilated bowel. 5. New mild compression deformity of L4, age indeterminate. Correlate for point tenderness. 6. Multiple lesions, including left Bosniak I benign renal cyst measuring 1.5 cm. No follow-up imaging is recommended.   Patient started on hydration with NS and given Zofran Hospitalist consulted for admission.   Assessment and Plan: Hypotonic hyponatremia Serum osmolality 274 slightly  low Hyponatremia could be due to SIADH  Sodium 123--124 CT abdomen and pelvis does not reveal an etiology CXR negative for any acute findings Continue NS IV fluid Started sodium chloride tablet 1 g p.o. 3 times daily for 3 days Monitor sodium level every 8 hourly Goal to correct sodium 8 to 10 mEq in 24 hours    Hypertensive urgency BP 208/95 on admission No antihypertensive seen on home med list Started amlodipine 5 mg p.o. daily Use hydralazine p.o. 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  Interventions:  Diet: Regular diet DVT Prophylaxis: Subcutaneous Lovenox   Advance goals of care discussion: DNR  Family Communication: family was not present at bedside, at the time of interview.  F2The pt provided permission to discuss medical plan with the family. Opportunity was given to ask question and all questions were answered satisfactorily.  8/17 d/w patient's daughter over the phone   Disposition:  Pt is from Home, admitted with hyponatremia, still has low sodium,  which precludes a safe discharge. Discharge to home versus SNF,  when stable.  Subjective: No significant events overnight, patient was admitted with nonspecific symptoms, weakness and decreased oral intake.  Patient is hard of hearing, stated that she does not feel good in the stomach, she denied any pain in the belly but stated that she she does not feel restful stomach.  Physical Exam: General: NAD, lying comfortably Appear in no distress, affect appropriate Eyes: PERRLA ENT: Oral Mucosa Clear, dry Neck: no JVD,  Cardiovascular: S1 and S2 Present, no Murmur,  Respiratory: good respiratory effort, Bilateral Air entry equal and Decreased, no Crackles, no wheezes Abdomen: Bowel Sound present, Soft and no tenderness, LLQ colostomy intact Skin: no rashes Extremities: no Pedal edema, no calf tenderness Neurologic: without any new focal findings Gait not checked due to patient safety concerns  Vitals:   08/07/23 0048 08/07/23 0115 08/07/23 0400 08/07/23 0835  BP: (!) 193/86  (!) 148/66 (!) 165/73  Pulse: 92   75  Resp: 18   19  Temp: 97.8 F (36.6 C)  98 F (36.7 C) 98.1 F (36.7 C)  TempSrc: Oral  Oral   SpO2: 98%  99% 98%  Weight:  44 kg    Height:        Intake/Output Summary (Last 24 hours) at 08/07/2023 1505 Last data filed at 08/07/2023 1426 Gross per 24 hour  Intake 1404.46 ml  Output 1100 ml  Net 304.46 ml   Filed Weights   08/07/23 0024 08/07/23 0115  Weight: 48.8 kg 44 kg    Data Reviewed: I have personally reviewed and interpreted daily labs, tele strips, imagings as discussed above. I reviewed all nursing notes, pharmacy notes, vitals, pertinent old records I have discussed plan of care as described above with RN and patient/family.  CBC: Recent Labs  Lab 08/06/23 2034  WBC 7.6  HGB 12.7  HCT 37.6  MCV 86.2  PLT 255   Basic Metabolic Panel: Recent Labs  Lab 08/06/23 2034 08/07/23 0126 08/07/23 0607 08/07/23 0836  NA 123* 123* 124* 124*  K 3.5 3.8 3.6 4.2  CL 88* 90* 94* 94*  CO2 24 22 23 22   GLUCOSE 135*  122* 100* 89  BUN 20 17 17 16   CREATININE 1.14* 1.06* 1.13* 1.03*  CALCIUM 9.0 8.6* 8.2* 8.4*  MG  --   --   --  1.8  PHOS  --   --   --  3.1    Studies: DG Chest Portable 1 View  Result Date: 08/06/2023 CLINICAL DATA:  Generalized weakness EXAM: PORTABLE CHEST 1 VIEW COMPARISON:  None Available. FINDINGS: The lungs are hyperinflated in keeping with changes of underlying COPD. No superimposed focal pulmonary infiltrate. Biapical pleuroparenchymal scarring noted. No pneumothorax or pleural effusion. Cardiac size within normal limits. No acute bone abnormality. IMPRESSION: 1. COPD. No active disease. Electronically Signed   By: Helyn Numbers M.D.   On: 08/06/2023 23:50   CT ABDOMEN PELVIS W CONTRAST  Result Date: 08/06/2023 CLINICAL DATA:  Abdominal pain EXAM: CT ABDOMEN AND PELVIS WITH CONTRAST TECHNIQUE: Multidetector CT imaging of the abdomen and pelvis was performed using the standard protocol following bolus administration of intravenous contrast. RADIATION DOSE REDUCTION: This exam was performed according to the departmental dose-optimization program which includes automated exposure control, adjustment of the mA and/or kV according to patient size and/or use of iterative reconstruction technique. CONTRAST:  75mL OMNIPAQUE IOHEXOL 300 MG/ML  SOLN COMPARISON:  CT abdomen and pelvis 09/10/2021 FINDINGS: Lower  chest: Atelectasis and scarring is noted in the lung bases. Hepatobiliary: Gallstones are present. Common bile duct is dilated measuring 11 mm similar to prior. Hepatic cysts are present. The largest is in the right lobe of the liver measuring 3.1 cm. Pancreas: Unremarkable. No pancreatic ductal dilatation or surrounding inflammatory changes. Spleen: Normal in size without focal abnormality. Adrenals/Urinary Tract: Limited evaluation in the pelvis secondary to streak artifact. The bladder is distended, but grossly within normal limits. There is mild right-sided hydronephrosis without  obstructing calculus identified. There is a left renal cortical cyst measuring 15 mm. There are additional rounded cortical hypodensities in the kidneys which are too small to characterize, also likely cysts. There some calcifications in the right adrenal gland, unchanged. Left adrenal gland is within normal limits. Stomach/Bowel: There surgical clips in the posterior pelvis. There is a left lower quadrant colostomy with large peristomal hernia containing nondilated bowel similar to the prior study. There is no bowel obstruction, pneumatosis or free air. Appendix is not visualized. Stomach is within normal limits. Vascular/Lymphatic: Aortic atherosclerosis. No enlarged abdominal or pelvic lymph nodes. Reproductive: Uterus and adnexa are not well evaluated secondary to streak artifact in the pelvis. Other: No ascites. Musculoskeletal: Bones are osteopenic. Mild compression deformity of L4 is new from prior. Left-sided hip screw is present. IMPRESSION: 1. Mild right-sided hydronephrosis of uncertain etiology. No obstructing calculus identified. 2. Cholelithiasis. 3. Stable common bile duct dilatation.  Correlate with lab values. 4. Unchanged left lower quadrant colostomy with large peristomal hernia containing nondilated bowel. 5. New mild compression deformity of L4, age indeterminate. Correlate for point tenderness. 6. Multiple lesions, including left Bosniak I benign renal cyst measuring 1.5 cm. No follow-up imaging is recommended. JACR 2018 Feb; 264-273, Management of the Incidental Renal Mass on CT, [employer] 2021; 814-848, Bosniak Classification of Cystic Renal Masses, Version 2019. Aortic Atherosclerosis (ICD10-I70.0). Electronically Signed   By: Darliss Cheney M.D.   On: 08/06/2023 23:27    Scheduled Meds:  amLODipine  5 mg Oral Daily   enoxaparin (LOVENOX) injection  30 mg Subcutaneous Q24H   sodium chloride  2 g Oral TID WC   Continuous Infusions:  sodium chloride 100 mL/hr at 08/07/23 1121   PRN  Meds: acetaminophen **OR** acetaminophen, hydrALAZINE **OR** hydrALAZINE, HYDROcodone-acetaminophen, ondansetron **OR** ondansetron (ZOFRAN) IV  Time spent: 35 minutes  Author: Gillis Santa. MD [facility] 08/07/2023 3:05 PM  To reach On-call, see care teams to locate the attending and reach out to them via [email or web address]. If 7PM-7AM, please contact night-coverage If you still have difficulty reaching the attending provider, please page the [facility] (Director on Call) for [facility] on amion for assistance.

## 2023-08-07 NOTE — Hospital Course (Signed)
essential hypertension, hyperlipidemia, CKD lllb, anemia of chronic disease, B12 deficiency, ventral hernia, colostomy bag, who was brought to the ED with concerns for decreased oral intake for several days and decreased urinary input.  She has mild lower abdominal tenderness.  Has no vomiting or diarrhea.  Denies cough, chest pain or shortness of breath. ED Course and data review: BP 208/95, mild tachypnea to 22 with otherwise normal vitals. Labs: Notable for sodium of 123.  Creatinine at baseline at 1.14.  Urinalysis without signs of infection.  CBC normal, likely normal limits. Urine sodium 130 urine osmolality 372, serum osmolality pending EKG, personally viewed and interpreted with sinus rhythm at 91 with nonspecific ST-T wave changes. Chest x-ray pending CT abdomen and pelvis with multiple nonacute findings except for possible new mild compression deformity at L4, further detailed below: MPRESSION: 1. Mild right-sided hydronephrosis of uncertain etiology. No obstructing calculus identified. 2. Cholelithiasis. 3. Stable common bile duct dilatation.  Correlate with lab values. 4. Unchanged left lower quadrant colostomy with large peristomal hernia containing nondilated bowel. 5. New mild compression deformity of L4, age indeterminate. Correlate for point tenderness. 6. Multiple lesions, including left Bosniak I benign renal cyst measuring 1.5 cm. No follow-up imaging is recommended.  Patient started on hydration with NS and given Zofran Hospitalist consulted for admission.

## 2023-08-07 NOTE — Progress Notes (Signed)
Anticoagulation monitoring(Lovenox):  89 yo female ordered Lovenox 40 mg Q24h    Filed Weights   08/07/23 0024  Weight: 48.8 kg (107 lb 9.4 oz)   BMI 19.7    Lab Results  Component Value Date   CREATININE 1.14 (H) 08/06/2023   CREATININE 1.35 (H) 04/02/2022   CREATININE 1.29 (H) 04/01/2022   Estimated Creatinine Clearance: 21.2 mL/min (A) (by C-G formula based on SCr of 1.14 mg/dL (H)). Hemoglobin & Hematocrit     Component Value Date/Time   HGB 12.7 08/06/2023 2034   HGB 12.7 09/19/2012 0942   HCT 37.6 08/06/2023 2034   HCT 37.5 09/19/2012 0942     Per Protocol for Patient with estCrcl < 30 ml/min and BMI < 30, will transition to Lovenox 30 mg Q24h.

## 2023-08-07 NOTE — Progress Notes (Addendum)
Pt came in for hyponatremia but no  cardiac monitoring order. NP Jon Billings made aware. Iwll continue to monitor.  Update 0128: See new orders.  Update 0132: Pt has been changed 4 times d/t incontinence within the span of 30 minutes. Pt has an order to not place purewick. Per pt has not got up yet d/t weakness. PT was consulted. MD Para March made aware and okay to place purewick at this time.  Update 0133: See new orders.  Update 0135: Unable to complete screening assessment. Pt only alert to self and time. Will continue to monitor.  Update 0157: MD Para March placed order for Covid swab. Will continue to monitor.

## 2023-08-07 NOTE — Plan of Care (Signed)

## 2023-08-08 ENCOUNTER — Inpatient Hospital Stay: Payer: Medicare Other

## 2023-08-08 ENCOUNTER — Encounter: Payer: Self-pay | Admitting: Student

## 2023-08-08 DIAGNOSIS — E871 Hypo-osmolality and hyponatremia: Secondary | ICD-10-CM | POA: Diagnosis not present

## 2023-08-08 LAB — MAGNESIUM: Magnesium: 1.5 mg/dL — ABNORMAL LOW (ref 1.7–2.4)

## 2023-08-08 LAB — BASIC METABOLIC PANEL
Anion gap: 10 (ref 5–15)
BUN: 21 mg/dL (ref 8–23)
CO2: 22 mmol/L (ref 22–32)
Calcium: 8.6 mg/dL — ABNORMAL LOW (ref 8.9–10.3)
Chloride: 94 mmol/L — ABNORMAL LOW (ref 98–111)
Creatinine, Ser: 1.18 mg/dL — ABNORMAL HIGH (ref 0.44–1.00)
GFR, Estimated: 42 mL/min — ABNORMAL LOW (ref >60)
Glucose, Bld: 101 mg/dL — ABNORMAL HIGH (ref 70–99)
Potassium: 3.8 mmol/L (ref 3.5–5.1)
Sodium: 126 mmol/L — ABNORMAL LOW (ref 135–145)

## 2023-08-08 LAB — CBC
HCT: 34.4 % — ABNORMAL LOW (ref 36.0–46.0)
Hemoglobin: 11.6 g/dL — ABNORMAL LOW (ref 12.0–15.0)
MCH: 28.8 pg (ref 26.0–34.0)
MCHC: 33.7 g/dL (ref 30.0–36.0)
MCV: 85.4 fL (ref 80.0–100.0)
Platelets: 215 10*3/uL (ref 150–400)
RBC: 4.03 MIL/uL (ref 3.87–5.11)
RDW: 13.2 % (ref 11.5–15.5)
WBC: 6.4 10*3/uL (ref 4.0–10.5)
nRBC: 0 % (ref 0.0–0.2)

## 2023-08-08 LAB — PHOSPHORUS: Phosphorus: 2.6 mg/dL (ref 2.5–4.6)

## 2023-08-08 MED ORDER — MAGNESIUM SULFATE 4 GM/100ML IV SOLN
4.0000 g | Freq: Once | INTRAVENOUS | Status: AC
  Administered 2023-08-08: 4 g via INTRAVENOUS
  Filled 2023-08-08: qty 100

## 2023-08-08 MED ORDER — POLYETHYLENE GLYCOL 3350 17 G PO PACK
17.0000 g | PACK | Freq: Two times a day (BID) | ORAL | Status: DC
  Administered 2023-08-08 – 2023-08-09 (×3): 17 g via ORAL
  Filled 2023-08-08 (×4): qty 1

## 2023-08-08 MED ORDER — BISACODYL 5 MG PO TBEC
10.0000 mg | DELAYED_RELEASE_TABLET | Freq: Every day | ORAL | Status: DC
  Administered 2023-08-08: 10 mg via ORAL
  Filled 2023-08-08: qty 2

## 2023-08-08 MED ORDER — ALPRAZOLAM 0.5 MG PO TABS
0.5000 mg | ORAL_TABLET | Freq: Three times a day (TID) | ORAL | Status: DC | PRN
  Administered 2023-08-08 – 2023-08-10 (×4): 0.5 mg via ORAL
  Filled 2023-08-08 (×4): qty 1

## 2023-08-08 MED ORDER — BISACODYL 5 MG PO TBEC
10.0000 mg | DELAYED_RELEASE_TABLET | Freq: Once | ORAL | Status: AC
  Administered 2023-08-08: 10 mg via ORAL
  Filled 2023-08-08: qty 2

## 2023-08-08 NOTE — Progress Notes (Signed)
[facility] Progress Note  Patient: Autumn Johnston    ZOX:[id number]  DOA: 08/06/2023     Date of Service: the patient was seen and examined on 08/08/2023  Chief Complaint  Patient presents with   Nausea   Brief hospital course: Autumn Johnston is a 89 y.o. female with medical history significant for essential hypertension, hyperlipidemia, CKD lllb, anemia of chronic disease, B12 deficiency, colon cancer s/p colostomy with parastomal hernia, who was brought to the ED with concerns for decreased oral intake for several days and decreased urinary input.  She has mild lower abdominal tenderness.  Has no vomiting or diarrhea.  Denies cough, chest pain or shortness of breath. ED Course and data review: BP 208/95, mild tachypnea to 22 with otherwise normal vitals. Labs: Notable for sodium of 123.  Creatinine at baseline at 1.14.  Urinalysis without signs of infection.  CBC normal, likely normal limits. Urine sodium 130 urine osmolality 372, serum osmolality pending EKG, personally viewed and interpreted with sinus rhythm at 91 with nonspecific ST-T wave changes. Chest x-ray pending CT abdomen and pelvis with multiple nonacute findings except for possible new mild compression deformity at L4, further detailed below: MPRESSION:  1. Mild right-sided hydronephrosis of uncertain etiology. No obstructing calculus identified. 2. Cholelithiasis. 3. Stable common bile duct dilatation.  Correlate with lab values. 4. Unchanged left lower quadrant colostomy with large peristomal hernia containing nondilated bowel. 5. New mild compression deformity of L4, age indeterminate. Correlate for point tenderness. 6. Multiple lesions, including left Bosniak I benign renal cyst measuring 1.5 cm. No follow-up imaging is recommended.   Patient started on hydration with NS and given Zofran Hospitalist consulted for admission.   Assessment and Plan: Hypotonic hyponatremia most likely SIADH due to SSRI Serum  osmolality 274 slightly low Sodium 123--124--126 CT abdomen and pelvis does not reveal an etiology CXR negative for any acute findings Continue NS IV fluid Started sodium chloride tablet 1 g p.o. 3 times daily for 3 days Monitor sodium level every 8 hourly Goal to correct sodium 8 to 10 mEq in 24 hours  Hypomagnesemia, mag repleted. Monitor electrolytes daily    Hypertensive urgency BP 208/95 on admission No antihypertensive seen on home med list Started amlodipine 5 mg p.o. daily Use hydralazine p.o. 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  Interventions:  Diet: Regular diet DVT Prophylaxis: Subcutaneous Lovenox   Advance goals of care discussion: DNR  Family Communication: family was not present at bedside, at the time of interview.  F2The pt provided permission to discuss medical plan with the family. Opportunity was given to ask question and all questions were answered satisfactorily.  8/17 d/w patient's daughter over the phone   Disposition:  Pt is from Home, admitted with hyponatremia,  still has low sodium, which precludes a safe discharge. Discharge to home versus SNF, when stable.  Subjective: No significant events overnight, patient is anxious, did not sleep well last night, she is requesting for her Ambien which she takes at home.  Patient had no bowel movement in the colostomy, denied any abdominal pain.  Physical Exam: General: NAD, lying comfortably Appear in no distress, affect appropriate Eyes: PERRLA ENT: Oral Mucosa Clear, dry Neck: no JVD,  Cardiovascular: S1 and S2 Present, no Murmur,  Respiratory: good respiratory effort, Bilateral Air entry equal and Decreased, no Crackles, no wheezes Abdomen: Bowel Sound present, Soft and no tenderness, LLQ colostomy intact Skin: no rashes Extremities: no Pedal edema, no calf tenderness Neurologic: without any new focal findings Gait not checked due to patient safety concerns  Vitals:   08/07/23 1555 08/07/23 1928 08/08/23 0434 08/08/23 0832  BP: (!) 161/78 (!) 164/64 (!) 178/88 (!) 165/73  Pulse: 84 (!) 44 92 92  Resp: 18 16 16 18   Temp: 98.2 F (36.8 C) 98.6 F (37 C) 98 F (36.7 C) 97.8 F (36.6 C)  TempSrc:  Oral Oral Oral  SpO2: 99% 96% 99% 99%  Weight:   46 kg   Height:        Intake/Output Summary (Last 24 hours) at 08/08/2023 1505 Last data filed at 08/08/2023 1500 Gross per 24 hour  Intake 2133.37 ml  Output 700 ml  Net 1433.37 ml   Filed Weights   08/07/23 0024 08/07/23 0115 08/08/23 0434  Weight: 48.8 kg 44 kg 46 kg    Data Reviewed: I have personally reviewed and interpreted daily labs, tele strips, imagings as discussed above. I reviewed all nursing notes, pharmacy notes, vitals, pertinent old records I have discussed plan of care as described above with RN and patient/family.  CBC: Recent Labs  Lab 08/06/23 2034 08/08/23 0421  WBC 7.6 6.4  HGB 12.7 11.6*  HCT 37.6 34.4*  MCV 86.2 85.4  PLT 255 215   Basic Metabolic Panel: Recent Labs  Lab 08/07/23 0126 08/07/23 0607  08/07/23 0836 08/07/23 1616 08/08/23 0102 08/08/23 0421  NA 123* 124* 124* 126* 126*  --   K 3.8 3.6 4.2 3.6 3.8  --   CL 90* 94* 94* 93* 94*  --   CO2 22 23 22 23 22   --   GLUCOSE 122* 100* 89 108* 101*  --   BUN 17 17 16 19 21   --   CREATININE 1.06* 1.13* 1.03* 1.21* 1.18*  --   CALCIUM 8.6* 8.2* 8.4* 8.5* 8.6*  --   MG  --   --  1.8  --   --  1.5*  PHOS  --   --  3.1  --   --  2.6    Studies: DG Abd 1 View  Result Date: 08/08/2023 CLINICAL DATA:  Small bowel obstruction. EXAM: ABDOMEN - 1 VIEW COMPARISON:  03/10/2010, abdominal CT 08/06/2023 FINDINGS: Bowel gas pattern is nonobstructive. There are a few air-filled nondilated small bowel loops in the left mid abdomen. No free peritoneal air. Remainder of the exam is unchanged. IMPRESSION: Nonobstructive bowel gas pattern. Electronically Signed   By: Elberta Fortis M.D.   On: 08/08/2023 08:50    Scheduled Meds:  amLODipine  5 mg Oral Daily   enoxaparin (LOVENOX) injection  30 mg Subcutaneous Q24H   feeding supplement  237 mL Oral BID BM   multivitamin with minerals  1 tablet  Oral Daily   sodium chloride  2 g Oral TID WC   Continuous Infusions:  sodium chloride 100 mL/hr at 08/08/23 0043   PRN Meds: acetaminophen **OR** acetaminophen, ALPRAZolam, hydrALAZINE **OR** hydrALAZINE, HYDROcodone-acetaminophen, ondansetron **OR** ondansetron (ZOFRAN) IV, zolpidem  Time spent: 35 minutes  Author: Gillis Santa. MD [facility] 08/08/2023 3:05 PM  To reach On-call, see care teams to locate the attending and reach out to them via [email or web address]. If 7PM-7AM, please contact night-coverage If you still have difficulty reaching the attending provider, please page the [facility] (Director on Call) for [facility] on amion for assistance.

## 2023-08-08 NOTE — Progress Notes (Signed)
Mobility Specialist - Progress Note     08/08/23 1200  Mobility  Activity Ambulated with assistance in hallway  Level of Assistance Minimal assist, patient does 75% or more  Assistive Device Front wheel walker  Distance Ambulated (ft) 6 ft  Range of Motion/Exercises Active  Activity Response Tolerated well  Mobility Referral Yes  $Mobility charge 1 Mobility  Mobility Specialist Start Time (ACUTE ONLY) 1210  Mobility Specialist Stop Time (ACUTE ONLY) 1247  Mobility Specialist Time Calculation (min) (ACUTE ONLY) 37 min   Pt resting in bed on RA upon entry. Pt STS x3 and ambulates to chair at end of bed in room MinA with RW. NT performed hygiene and pt returned to bed and left witth needs in reach. Pt bed alarm activated.   Autumn Johnston Mobility Specialist 08/08/23, 12:52 PM

## 2023-08-08 NOTE — TOC Progression Note (Signed)
Transition of Care [facility]) - Progression Note    Patient Details  Name: Lenoria Slattery MRN: [id number] Date of Birth: January 21, 1922  [facility]) CM/SW Contact  Bing Quarry, RN Phone Number: 08/08/2023, 10:13 AM  Clinical Narrative: 8/18: PT recommendation for post acute HH. PLOF: ambulatory with RW. S/p colostomy and inpatient would care/ostomy instructions. Patient per RN assessment is only oriented to person this am. Spoke with daughter, Riley Kill, regarding [facility] agency choice. Was last active with [facility] in 2023 and wanted to go with that agency again. Contacted Jason at [employer], Surveyor, mining.   Patient had decreased oral intake and decreased urine output with labs showing low Na, also receiving Mg replacement IV. Adult children rotate staying with patient in her home and will need this extra help for safety. High Fall Risk.   Prior post acute services/facilities include [facility] 2023, [facility], 2022, [facility] 2022 and [facility] 2022. Prior Inpatient stays at [facility] in 2022 for Left Femur Fx and April 2023 for pna/AKI.   HH orders pending disposition, TOC to follow through discharge.   Gabriel Cirri MSN RN CM  Transitions of Care Department [facility] [phone] Weekends Only       Barriers to Discharge: Continued Medical Work up  Expected Discharge Plan and Services                         DME Arranged:  (Has RW at home)         HH Arranged:  (TBD) HH Agency: [facility] ([facility]) Date HH Agency Contacted: 08/08/23 Time HH Agency Contacted: 1013 Representative spoke with at [facility] Agency: Troy Hartzog Cower   Social Determinants of Health (SDOH) Interventions SDOH Screenings   Tobacco Use: Low Risk  (08/04/2023)   Received from [facility]    Readmission Risk Interventions    03/31/2022   11:40 AM 09/17/2021    9:37 AM  Readmission Risk Prevention Plan  Transportation Screening Complete Complete   PCP or Specialist Appt within 3-5 Days Complete   HRI or Home Care Consult Complete   Social Work Consult for Recovery Care Planning/Counseling Complete   Palliative Care Screening Not Applicable   Medication Review Oceanographer) Complete Complete  PCP or Specialist appointment within 3-5 days of discharge  Complete  HRI or Home Care Consult  Complete  SW Recovery Care/Counseling Consult  Not Complete  SW Consult Not Complete Comments  RNCM assigned to case  Palliative Care Screening  Not Applicable  Comments  na  Skilled Nursing Facility  Complete

## 2023-08-08 NOTE — Plan of Care (Signed)

## 2023-08-08 NOTE — Plan of Care (Signed)
  Problem: Clinical Measurements: Goal: Ability to maintain clinical measurements within normal limits will improve Outcome: Progressing   Problem: Clinical Measurements: Goal: Will remain free from infection Outcome: Progressing   Problem: Nutrition: Goal: Adequate nutrition will be maintained Outcome: Progressing   Problem: Pain Managment: Goal: General experience of comfort will improve Outcome: Progressing   Problem: Skin Integrity: Goal: Risk for impaired skin integrity will decrease Outcome: Progressing

## 2023-08-09 DIAGNOSIS — E871 Hypo-osmolality and hyponatremia: Secondary | ICD-10-CM | POA: Diagnosis not present

## 2023-08-09 LAB — BASIC METABOLIC PANEL
Anion gap: 3 — ABNORMAL LOW (ref 5–15)
BUN: 23 mg/dL (ref 8–23)
CO2: 25 mmol/L (ref 22–32)
Calcium: 8.1 mg/dL — ABNORMAL LOW (ref 8.9–10.3)
Chloride: 103 mmol/L (ref 98–111)
Creatinine, Ser: 1.08 mg/dL — ABNORMAL HIGH (ref 0.44–1.00)
GFR, Estimated: 46 mL/min — ABNORMAL LOW (ref >60)
Glucose, Bld: 90 mg/dL (ref 70–99)
Potassium: 3.7 mmol/L (ref 3.5–5.1)
Sodium: 131 mmol/L — ABNORMAL LOW (ref 135–145)

## 2023-08-09 LAB — CBC
HCT: 33 % — ABNORMAL LOW (ref 36.0–46.0)
Hemoglobin: 11 g/dL — ABNORMAL LOW (ref 12.0–15.0)
MCH: 29.1 pg (ref 26.0–34.0)
MCHC: 33.3 g/dL (ref 30.0–36.0)
MCV: 87.3 fL (ref 80.0–100.0)
Platelets: 214 10*3/uL (ref 150–400)
RBC: 3.78 MIL/uL — ABNORMAL LOW (ref 3.87–5.11)
RDW: 13.7 % (ref 11.5–15.5)
WBC: 7.5 10*3/uL (ref 4.0–10.5)
nRBC: 0 % (ref 0.0–0.2)

## 2023-08-09 LAB — PHOSPHORUS: Phosphorus: 2.9 mg/dL (ref 2.5–4.6)

## 2023-08-09 LAB — MAGNESIUM: Magnesium: 2.6 mg/dL — ABNORMAL HIGH (ref 1.7–2.4)

## 2023-08-09 MED ORDER — GERHARDT'S BUTT CREAM
TOPICAL_CREAM | Freq: Four times a day (QID) | CUTANEOUS | Status: DC | PRN
  Filled 2023-08-09: qty 1

## 2023-08-09 MED ORDER — DIATRIZOATE MEGLUMINE & SODIUM 66-10 % PO SOLN
90.0000 mL | Freq: Once | ORAL | Status: AC
  Administered 2023-08-09: 90 mL via ORAL

## 2023-08-09 NOTE — TOC Progression Note (Signed)
Transition of Care [facility]) - Progression Note    Patient Details  Name: Autumn Johnston MRN: [id number] Date of Birth: 10-29-1922  Transition of Care [facility]) CM/SW Contact  Truddie Hidden, RN Phone Number: 08/09/2023, 9:39 AM  Clinical Narrative:    Message sent to Barbara Cower from [facility] regarding referral sent.      Barriers to Discharge: Continued Medical Work up  Expected Discharge Plan and Services                         DME Arranged:  (Has RW at home)         HH Arranged:  (TBD) HH Agency: [facility] ([facility]) Date HH Agency Contacted: 08/08/23 Time HH Agency Contacted: 1013 Representative spoke with at [facility] Agency: Barbara Cower   Social Determinants of Health (SDOH) Interventions SDOH Screenings   Food Insecurity: No Food Insecurity (08/08/2023)  Housing: Low Risk  (08/08/2023)  Transportation Needs: No Transportation Needs (08/08/2023)  Utilities: Not At Risk (08/08/2023)  Tobacco Use: Low Risk  (08/08/2023)    Readmission Risk Interventions    03/31/2022   11:40 AM 09/17/2021    9:37 AM  Readmission Risk Prevention Plan  Transportation Screening Complete Complete  PCP or Specialist Appt within 3-5 Days Complete   HRI or Home Care Consult Complete   Social Work Consult for Recovery Care Planning/Counseling Complete   Palliative Care Screening Not Applicable   Medication Review Oceanographer) Complete Complete  PCP or Specialist appointment within 3-5 days of discharge  Complete  HRI or Home Care Consult  Complete  SW Recovery Care/Counseling Consult  Not Complete  SW Consult Not Complete Comments  RNCM assigned to case  Palliative Care Screening  Not Applicable  Comments  na  Skilled Nursing Facility  Complete

## 2023-08-09 NOTE — Progress Notes (Signed)
[facility] Progress Note  Patient: Autumn Johnston    WGN:[id number]  DOA: 08/06/2023     Date of Service: the patient was seen and examined on 08/09/2023  Chief Complaint  Patient presents with   Nausea   Brief hospital course: Autumn Johnston is a 89 y.o. female with medical history significant for essential hypertension, hyperlipidemia, CKD lllb, anemia of chronic disease, B12 deficiency, colon cancer s/p colostomy with parastomal hernia, who was brought to the ED with concerns for decreased oral intake for several days and decreased urinary input.  She has mild lower abdominal tenderness.  Has no vomiting or diarrhea.  Denies cough, chest pain or shortness of breath. ED Course and data review: BP 208/95, mild tachypnea to 22 with otherwise normal vitals. Labs: Notable for sodium of 123.  Creatinine at baseline at 1.14.  Urinalysis without signs of infection.  CBC normal, likely normal limits. Urine sodium 130 urine osmolality 372, serum osmolality pending EKG, personally viewed and interpreted with sinus rhythm at 91 with nonspecific ST-T wave changes. Chest x-ray pending CT abdomen and pelvis with multiple nonacute findings except for possible new mild compression deformity at L4, further detailed below: MPRESSION:  1. Mild right-sided hydronephrosis of uncertain etiology. No obstructing calculus identified. 2. Cholelithiasis. 3. Stable common bile duct dilatation.  Correlate with lab values. 4. Unchanged left lower quadrant colostomy with large peristomal hernia containing nondilated bowel. 5. New mild compression deformity of L4, age indeterminate. Correlate for point tenderness. 6. Multiple lesions, including left Bosniak I benign renal cyst measuring 1.5 cm. No follow-up imaging is recommended.   Patient started on hydration with NS and given Zofran Hospitalist consulted for admission.   Assessment and Plan: Hypotonic hyponatremia most likely SIADH due to SSRI Serum  osmolality 274 slightly low Sodium 123--124--126--131 CT abdomen and pelvis does not reveal an etiology CXR negative for any acute findings Continue NS IV fluid Started sodium chloride tablet 1 g p.o. 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 Monitor electrolytes daily    Hypertensive urgency BP 208/95 on admission No antihypertensive seen on home med list Started amlodipine 5 mg p.o. daily Use hydralazine p.o. versus IV as per SBP Monitor BP and titrate medications accordingly   Hydronephrosis of right kidney Incidental finding on CT, with no obstructing calculus identified Patient also has microscopic hematuria but otherwise unremarkable UA, no infection Can consider renal ultrasound, urology consult but in view of advanced age of 68, suspect conservative management/surveillance for worsening abdominal pain be recommended Intake output monitoring with bladder scan as needed   Protein calorie malnutrition Frailty Nutritionist consult, PT consult   Closed compression fracture of L4 vertebra  Age-indeterminate fracture seen Pain control PT eval   Stage 3b chronic kidney disease  Creatinine at baseline at 1.17   Body mass index is 18.15 kg/m.  Interventions:  Diet: Regular diet DVT Prophylaxis: Subcutaneous Lovenox   Advance goals of care discussion: DNR  Family Communication: family was not present at bedside, at the time of interview.  F2The pt provided permission to discuss medical plan with the family. Opportunity was given to ask question and all questions were answered satisfactorily.  8/19 d/w patient's  daughter at  bedside.     Disposition:  Pt is from Home, admitted with hyponatremia, still has low sodium, which precludes a safe discharge. Discharge to home versus SNF, when stable.  Subjective: No significant events overnight, patient was sleepy, hard of hearing, resting comfortably.  Denied any complaints.    Physical Exam: General: NAD, lying comfortably Appear in no distress, affect appropriate Eyes: PERRLA ENT: Oral Mucosa Clear, dry Neck: no JVD,  Cardiovascular: S1 and S2 Present, no Murmur,  Respiratory: good respiratory effort, Bilateral Air entry equal and Decreased, no Crackles, no wheezes Abdomen: Bowel Sound present, Soft and no tenderness, LLQ colostomy intact Skin: no rashes Extremities: no Pedal edema, no calf tenderness Neurologic: without any new focal findings Gait not checked due to patient safety concerns  Vitals:   08/09/23 0200 08/09/23 0351 08/09/23 0400 08/09/23 0819  BP:  (!) 164/79  (!) 161/78  Pulse:  76  72  Resp: 15 16 16 19   Temp:  97.8 F (36.6 C)  98.3 F (36.8 C)  TempSrc:  Oral    SpO2:  99%  100%  Weight:  45 kg    Height:        Intake/Output Summary (Last 24 hours) at 08/09/2023 1457 Last data filed at 08/09/2023 0700 Gross per 24 hour  Intake 2019.76 ml  Output 1050 ml  Net 969.76 ml   Filed Weights   08/07/23 0115 08/08/23 0434 08/09/23 0351  Weight: 44 kg 46 kg 45 kg    Data Reviewed: I have personally reviewed and interpreted daily labs, tele strips, imagings as discussed above. I reviewed all nursing notes, pharmacy notes, vitals, pertinent old records I have discussed plan of care as described above with RN and patient/family.  CBC: Recent Labs  Lab 08/06/23 2034 08/08/23 0421 08/09/23 0514  WBC 7.6 6.4 7.5  HGB 12.7 11.6* 11.0*  HCT 37.6 34.4* 33.0*  MCV 86.2 85.4 87.3  PLT 255 215 214   Basic Metabolic Panel: Recent Labs  Lab 08/07/23 0607 08/07/23 0836 08/07/23 1616 08/08/23 0102 08/08/23 0421 08/09/23 0514   NA 124* 124* 126* 126*  --  131*  K 3.6 4.2 3.6 3.8  --  3.7  CL 94* 94* 93* 94*  --  103  CO2 23 22 23 22   --  25  GLUCOSE 100* 89 108* 101*  --  90  BUN 17 16 19 21   --  23  CREATININE 1.13* 1.03* 1.21* 1.18*  --  1.08*  CALCIUM 8.2* 8.4* 8.5* 8.6*  --  8.1*  MG  --  1.8  --   --  1.5* 2.6*  PHOS  --  3.1  --   --  2.6 2.9    Studies: No results found.  Scheduled Meds:  amLODipine  5 mg Oral Daily   bisacodyl  10 mg Oral QHS   diatrizoate meglumine-sodium  90 mL Oral Once   enoxaparin (LOVENOX) injection  30 mg Subcutaneous Q24H   feeding supplement  237 mL Oral BID BM   multivitamin with minerals  1 tablet Oral Daily   polyethylene glycol  17 g Oral BID   sodium chloride  2 g Oral TID WC   Continuous Infusions:  sodium chloride 100 mL/hr at 08/09/23 1335   PRN Meds: acetaminophen **OR** acetaminophen, ALPRAZolam, Gerhardt's butt cream, hydrALAZINE **OR** hydrALAZINE, HYDROcodone-acetaminophen, ondansetron **OR** ondansetron (ZOFRAN) IV, zolpidem  Time spent: 35 minutes  Author: Gillis Santa. MD [facility] 08/09/2023 2:57 PM  To reach  On-call, see care teams to locate the attending and reach out to them via [email or web address]. If 7PM-7AM, please contact night-coverage If you still have difficulty reaching the attending provider, please page the [facility] (Director on Call) for [facility] on amion for assistance.

## 2023-08-09 NOTE — Progress Notes (Signed)
Occupational Therapy Treatment Patient Details Name: Trechelle Johnston MRN: [id number] DOB: [birth year] Today's Date: 08/09/2023   History of present illness Autumn Johnston is a 89 y.o. female with medical history significant for essential hypertension, hyperlipidemia, CKD lllb, anemia of chronic disease, B12 deficiency, colon cancer s/p colostomy with parastomal hernia, who was brought to the ED with concerns for decreased oral intake for several days and decreased urinary input.   OT comments  Pt in bed upon OT arrival and daughter present for duration of session.  Pt tolerated functional mobility within room using RW and min guard-supv to reach bathroom for toileting, perform hand hygiene in standing, and to complete panty liner and Purewick change in standing.  Able to urinate minimally, but pt did have Purewick canister 3/4 full while in bed.  Assisted pt to recliner with encouragement to sit up for supper.  Instructed pt in ankle pumps x10 with encouragement to complete hourly when awake to promote LE circulation; good return demo.  Relayed pt's concerns re: throat feeling hoarse and pt wanting to make sure she gets her Ambien pill this evening.  RN present in room upon completion of OT session and addressed these concerns with pt.  Will continue to follow in the acute setting to work towards goals in OT poc.        If plan is discharge home, recommend the following:  Direct supervision/assist for financial management;Assistance with cooking/housework   Equipment Recommendations  BSC/3in1    Recommendations for Other Services      Precautions / Restrictions Precautions Precautions: Fall Precaution Comments: LLQ ostomy Restrictions Weight Bearing Restrictions: No       Mobility Bed Mobility Overal bed mobility: Modified Independent             General bed mobility comments: Increased effort/time to bring B LE's into bed Patient Response:  Cooperative  Transfers Overall transfer level: Needs assistance Equipment used: Rolling walker (2 wheels) Transfers: Sit to/from Stand Sit to Stand: Supervision     Step pivot transfers: Contact guard assist           Balance Overall balance assessment: Needs assistance Sitting-balance support: No upper extremity supported, Feet supported Sitting balance-Leahy Scale: Good Sitting balance - Comments: steady reaching within BOS   Standing balance support: Bilateral upper extremity supported, During functional activity, Reliant on assistive device for balance Standing balance-Leahy Scale: Good Standing balance comment: able to manage narrow turns in bathroom with RW and supv                           ADL either performed or assessed with clinical judgement   ADL Overall ADL's : Needs assistance/impaired     Grooming: Wash/dry hands;Supervision/safety;Standing               Lower Body Dressing: Minimal assistance;Sit to/from stand Lower Body Dressing Details (indicate cue type and reason): min A to manage gown while hiking and lowering panties d/t working around [employer] and IV line. Toilet Transfer: Contact guard assist;Rolling walker (2 wheels);Grab bars   Toileting- Clothing Manipulation and Hygiene: Minimal assistance Toileting - Clothing Manipulation Details (indicate cue type and reason): see above LB dressing     Functional mobility during ADLs: Supervision/safety;Contact guard assist;Rolling walker (2 wheels)      Extremity/Trunk Assessment Upper Extremity Assessment Upper Extremity Assessment: Generalized weakness   Lower Extremity Assessment Lower Extremity Assessment: Generalized weakness        Vision  Perception     Praxis      Cognition Arousal: Alert Behavior During Therapy: WFL for tasks assessed/performed Overall Cognitive Status: Within Functional Limits for tasks assessed                                  General Comments: perseverative on making sure she gets her Ambien this evening.  Pt thought she didn't get it last night, but RN confirmed she did, and RN confirmed with the pt during therapy that she will get it this evening as well.        Exercises Total Joint Exercises Ankle Circles/Pumps: Both, 10 reps    Shoulder Instructions       General Comments      Pertinent Vitals/ Pain       Pain Assessment Pain Assessment: No/denies pain  Home Living                                          Prior Functioning/Environment              Frequency  Min 1X/week        Progress Toward Goals  OT Goals(current goals can now be found in the care plan section)  Progress towards OT goals: Progressing toward goals  Acute Rehab OT Goals Patient Stated Goal: to go home OT Goal Formulation: With patient Time For Goal Achievement: 08/21/23 Potential to Achieve Goals: Good  Plan      Co-evaluation                 AM-PAC OT "6 Clicks" Daily Activity     Outcome Measure   Help from another person eating meals?: None Help from another person taking care of personal grooming?: None Help from another person toileting, which includes using toliet, bedpan, or urinal?: A Little Help from another person bathing (including washing, rinsing, drying)?: A Little Help from another person to put on and taking off regular upper body clothing?: None Help from another person to put on and taking off regular lower body clothing?: A Little 6 Click Score: 21    End of Session Equipment Utilized During Treatment: Gait belt;Rolling walker (2 wheels)  OT Visit Diagnosis: Unsteadiness on feet (R26.81);Muscle weakness (generalized) (M62.81)   Activity Tolerance Patient tolerated treatment well   Patient Left in chair;with call bell/phone within reach;with nursing/sitter in room;with family/visitor present   Nurse Communication Mobility status;Other (comment) (relayed  pt's concern for making sure she gets her Ambien pill tonight and pt/daughter wanted RN to know that pt felt hoarseness in her throat.)        Time: 4034-7425 OT Time Calculation (min): 33 min  Charges: OT General Charges $OT Visit: 1 Visit OT Treatments $Self Care/Home Management : 23-37 mins  Autumn Earthly, MS, OTR/L   Autumn Johnston 08/09/2023, 4:15 PM

## 2023-08-09 NOTE — Consult Note (Signed)
SURGICAL CONSULTATION NOTE   HISTORY OF PRESENT ILLNESS (HPI):  89 y.o. female presented to [facility] ED for evaluation of weakness.  Open complete evaluation at the ED she was found with weakness and hyponatremia.  She was admitted for hydration.  Patient denies any abdominal pain.  No perforation.  No alleviating or aggravating factors.  After family member that was at bedside patient has not had a bowel movement since 3 days ago.  Patient has a history of an colostomy that was done in the 1980s.  She developed hernia, as per patient he was 2 years later.  Again this was about 40 years ago.  She has never had any problem with colostomy in the past.  Since admission she has not had a bowel movement through the ostomy bag.  She had a CT scan of the abdomen and pelvis and abdominal x-ray that shows nonobstructive gas bowel pattern.  No sign of obstruction.  I personally evaluated the images.  Surgery is consulted by Dr. Lucianne Muss in this context for evaluation and management of concern of obstruction.  PAST MEDICAL HISTORY (PMH):  Past Medical History:  Diagnosis Date   CKD (chronic kidney disease) stage 4, GFR 15-29 ml/min (HCC)    Colon cancer (HCC)    s/p colostomy   Dementia (HCC)    Depression    Hypertension    Parastomal hernia      PAST SURGICAL HISTORY (PSH):  Past Surgical History:  Procedure Laterality Date   COLECTOMY WITH COLOSTOMY CREATION/HARTMANN PROCEDURE     INTRAMEDULLARY (IM) NAIL INTERTROCHANTERIC Left 09/08/2021   Procedure: INTRAMEDULLARY (IM) NAIL INTERTROCHANTRIC;  Surgeon: Signa Kell, MD;  Location: [facility] ORS;  Service: Orthopedics;  Laterality: Left;     MEDICATIONS:  Prior to Admission medications   Medication Sig Start Date End Date Taking? Authorizing Provider  ALPRAZolam (XANAX) 0.25 MG tablet Take 1 tablet (0.25 mg total) by mouth 2 (two) times daily as needed. 08/01/21  Yes Marrion Coy, MD  sertraline (ZOLOFT) 50 MG tablet Take 50 mg by mouth daily. 05/24/21   Yes [provider]  solifenacin (VESICARE) 10 MG tablet Take 10 mg by mouth daily. 08/04/23 08/03/24 Yes [provider]  zolpidem (AMBIEN) 10 MG tablet Take 10 mg by mouth at bedtime as needed. 03/18/22  Yes [provider]  polyethylene glycol (MIRALAX / GLYCOLAX) 17 g packet Take 17 g by mouth daily as needed for mild constipation. 04/02/22   Enedina Finner, MD  simvastatin (ZOCOR) 20 MG tablet Take 20 mg by mouth at bedtime. 05/24/21   [provider]     ALLERGIES:  No Known Allergies   SOCIAL HISTORY:  Social History   Socioeconomic History   Marital status: Widowed    Spouse name: Not on file   Number of children: Not on file   Years of education: Not on file   Highest education level: Not on file  Occupational History   Not on file  Tobacco Use   Smoking status: Never   Smokeless tobacco: Never  Vaping Use   Vaping status: Never Used  Substance and Sexual Activity   Alcohol use: Never   Drug use: Never   Sexual activity: Not Currently  Other Topics Concern   Not on file  Social History Narrative   Not on file   Social Determinants of Health   Financial Resource Strain: Not on file  Food Insecurity: No Food Insecurity (08/08/2023)   Hunger Vital Sign    Worried About  Running Out of Food in the Last Year: Never true    Ran Out of Food in the Last Year: Never true  Transportation Needs: No Transportation Needs (08/08/2023)   PRAPARE - Administrator, Civil Service (Medical): No    Lack of Transportation (Non-Medical): No  Physical Activity: Not on file  Stress: Not on file  Social Connections: Not on file  Intimate Partner Violence: Not At Risk (08/08/2023)   Humiliation, Afraid, Rape, and Kick questionnaire    Fear of Current or Ex-Partner: No    Emotionally Abused: No    Physically Abused: No    Sexually Abused: No      FAMILY HISTORY:  History reviewed. No pertinent family history.   REVIEW OF SYSTEMS:   Constitutional: denies weight loss, fever, chills, or sweats.  Positive for weakness Eyes: denies any other vision changes, history of eye injury  ENT: denies sore throat, hearing problems  Respiratory: denies shortness of breath, wheezing  Cardiovascular: denies chest pain, palpitations  Gastrointestinal: Denies abdominal pain, nausea and vomiting Genitourinary: denies burning with urination or urinary frequency Musculoskeletal: denies any other joint pains or cramps  Skin: denies any other rashes or skin discolorations  Neurological: denies any other headache, dizziness,  Psychiatric: denies any other depression, anxiety   All other review of systems were negative   VITAL SIGNS:  Temp:  [97.6 F (36.4 C)-98.3 F (36.8 C)] 98.3 F (36.8 C) (08/19 0819) Pulse Rate:  [72-79] 72 (08/19 0819) Resp:  [15-19] 19 (08/19 0819) BP: (159-164)/(64-79) 161/78 (08/19 0819) SpO2:  [97 %-100 %] 100 % (08/19 0819) Weight:  [45 kg] 45 kg (08/19 0351)     Height: 5\' 2"  (157.5 cm) Weight: 45 kg BMI (Calculated): 18.14   INTAKE/OUTPUT:  This shift: No intake/output data recorded.  Last 2 shifts: @IOLAST2SHIFTS @   PHYSICAL EXAM:  Constitutional:  -- Normal body habitus  -- Awake, alert, and oriented x3  Eyes:  -- Pupils equally round and reactive to light  -- No scleral icterus  Ear, nose, and throat:  -- No jugular venous distension  Pulmonary:  -- No crackles  -- Equal breath sounds bilaterally -- Breathing non-labored at rest Cardiovascular:  -- S1, S2 present  -- No pericardial rubs Gastrointestinal:  -- Abdomen soft, nontender, non-distended, no guarding or rebound tenderness -- Left lower quadrant colostomy with parastomal hernia.  Soft, nonreducible.  The mucosa of the colostomy is pink and patent.  Stoma was digitalized without any sign of obstruction.  Palpable hard stool on physical exam. -- Extremities: B/L UE and LE FROM, hands and feet warm, no edema  Neurologic:  --  Motor function: intact and symmetric -- Sensation: intact and symmetric   Labs:     Latest Ref Rng & Units 08/09/2023    5:14 AM 08/08/2023    4:21 AM 08/06/2023    8:34 PM  CBC  WBC 4.0 - 10.5 K/uL 7.5  6.4  7.6   Hemoglobin 12.0 - 15.0 g/dL 29.5  62.1  30.8   Hematocrit 36.0 - 46.0 % 33.0  34.4  37.6   Platelets 150 - 400 K/uL 214  215  255       Latest Ref Rng & Units 08/09/2023    5:14 AM 08/08/2023    1:02 AM 08/07/2023    4:16 PM  CMP  Glucose 70 - 99 mg/dL 90  657  846   BUN 8 - 23 mg/dL 23  21  19  Creatinine 0.44 - 1.00 mg/dL 5.78  4.69  6.29   Sodium 135 - 145 mmol/L 131  126  126   Potassium 3.5 - 5.1 mmol/L 3.7  3.8  3.6   Chloride 98 - 111 mmol/L 103  94  93   CO2 22 - 32 mmol/L 25  22  23    Calcium 8.9 - 10.3 mg/dL 8.1  8.6  8.5     Imaging studies:  EXAM: CT ABDOMEN AND PELVIS WITH CONTRAST   TECHNIQUE: Multidetector CT imaging of the abdomen and pelvis was performed using the standard protocol following bolus administration of intravenous contrast.   RADIATION DOSE REDUCTION: This exam was performed according to the departmental dose-optimization program which includes automated exposure control, adjustment of the mA and/or kV according to patient size and/or use of iterative reconstruction technique.   CONTRAST:  75mL OMNIPAQUE IOHEXOL 300 MG/ML  SOLN   COMPARISON:  CT abdomen and pelvis 09/10/2021   FINDINGS: Lower chest: Atelectasis and scarring is noted in the lung bases.   Hepatobiliary: Gallstones are present. Common bile duct is dilated measuring 11 mm similar to prior. Hepatic cysts are present. The largest is in the right lobe of the liver measuring 3.1 cm.   Pancreas: Unremarkable. No pancreatic ductal dilatation or surrounding inflammatory changes.   Spleen: Normal in size without focal abnormality.   Adrenals/Urinary Tract: Limited evaluation in the pelvis secondary to streak artifact. The bladder is distended, but grossly  within normal limits. There is mild right-sided hydronephrosis without obstructing calculus identified. There is a left renal cortical cyst measuring 15 mm. There are additional rounded cortical hypodensities in the kidneys which are too small to characterize, also likely cysts. There some calcifications in the right adrenal gland, unchanged. Left adrenal gland is within normal limits.   Stomach/Bowel: There surgical clips in the posterior pelvis. There is a left lower quadrant colostomy with large peristomal hernia containing nondilated bowel similar to the prior study. There is no bowel obstruction, pneumatosis or free air. Appendix is not visualized. Stomach is within normal limits.   Vascular/Lymphatic: Aortic atherosclerosis. No enlarged abdominal or pelvic lymph nodes.   Reproductive: Uterus and adnexa are not well evaluated secondary to streak artifact in the pelvis.   Other: No ascites.   Musculoskeletal: Bones are osteopenic. Mild compression deformity of L4 is new from prior. Left-sided hip screw is present.   IMPRESSION: 1. Mild right-sided hydronephrosis of uncertain etiology. No obstructing calculus identified. 2. Cholelithiasis. 3. Stable common bile duct dilatation.  Correlate with lab values. 4. Unchanged left lower quadrant colostomy with large peristomal hernia containing nondilated bowel. 5. New mild compression deformity of L4, age indeterminate. Correlate for point tenderness. 6. Multiple lesions, including left Bosniak I benign renal cyst measuring 1.5 cm. No follow-up imaging is recommended. JACR 2018 Feb; 264-273, Management of the Incidental Renal Mass on CT, [employer] 2021; 814-848, Bosniak Classification of Cystic Renal Masses, Version 2019.   Aortic Atherosclerosis (ICD10-I70.0).     Electronically Signed   By: Darliss Cheney M.D.   On: 08/06/2023 23:27  Assessment/Plan:  89 y.o. female with parastomal hernia, complicated by pertinent  comorbidities including constipation, hyponatremia, advanced age.  Parastomal hernia -Chronic parastomal hernia.  As per patient he has been present for 40 years. -She has not had a bowel movement since admission -My assessment is that this is due to constipation.  Will order Gastrografin for further evaluation.  Continue MiraLAX. -I personally evaluated the CT scan.  Most of the  large and small intestine is in the left lower quadrant hernia.  She has loss of domain with nod significant amount of space in the abdominal cavity for reduction of the hernia.  This is a chronic issue. -There was abundant amount of stool through the colostomy after taking the MiraLAX and Gastrografin -No concern of obstruction. -Patient is not a surgical candidate and there is no indication for any surgical intervention -No contraindication to discharge from surgical standpoint.  Surgery will sign off.  Hyponatremia -Continue adequate hydration -This is significantly improved  Constipation -Continue MiraLAX  Gae Gallop, MD

## 2023-08-09 NOTE — Progress Notes (Signed)
Physical Therapy Treatment Patient Details Name: Autumn Johnston MRN: [id number] DOB: [birth year] Today's Date: 08/09/2023   History of Present Illness Autumn Johnston is a 89 y.o. female with medical history significant for essential hypertension, hyperlipidemia, CKD lllb, anemia of chronic disease, B12 deficiency, colon cancer s/p colostomy with parastomal hernia, who was brought to the ED with concerns for decreased oral intake for several days and decreased urinary input.    PT Comments  Pt resting in bed upon PT arrival; pt's daughter present; pt agreeable to therapy.  During session pt modified independent with bed mobility; CGA with transfers using RW; and CGA to ambulate 60 feet with RW use (mild SOB noted with activity but SpO2 sats 92% or greater on room air during sessions activities).  No c/o pain during session.  Pt's daughter reports pt has almost 24/7 assist and feels comfortable with pt discharging home with home health (MD updated on pt's status).    If plan is discharge home, recommend the following: A little help with bathing/dressing/bathroom;Assistance with cooking/housework;Assist for transportation;Help with stairs or ramp for entrance;A little help with walking and/or transfers   Can travel by private vehicle      Yes  Equipment Recommendations  Rolling walker (2 wheels)    Recommendations for Other Services       Precautions / Restrictions Precautions Precautions: Fall Precaution Comments: LLQ ostomy Restrictions Weight Bearing Restrictions: No     Mobility  Bed Mobility Overal bed mobility: Modified Independent             General bed mobility comments: Increased effort/time to bring B LE's into bed    Transfers Overall transfer level: Needs assistance Equipment used: Rolling walker (2 wheels) Transfers: Sit to/from Stand Sit to Stand: Contact guard assist           General transfer comment: x2 transfers from bed; steady with RW  use    Ambulation/Gait Ambulation/Gait assistance: Contact guard assist Gait Distance (Feet): 60 Feet Assistive device: Rolling walker (2 wheels) Gait Pattern/deviations: Step-through pattern Gait velocity: decreased     General Gait Details: steady ambulating with RW; mild SOB with activity   Stairs             Wheelchair Mobility     Tilt Bed    Modified Rankin (Stroke Patients Only)       Balance Overall balance assessment: Needs assistance Sitting-balance support: No upper extremity supported, Feet supported Sitting balance-Leahy Scale: Good Sitting balance - Comments: steady reaching within BOS   Standing balance support: Bilateral upper extremity supported, During functional activity, Reliant on assistive device for balance Standing balance-Leahy Scale: Good Standing balance comment: steady ambulating with RW use                            Cognition Arousal: Alert Behavior During Therapy: WFL for tasks assessed/performed Overall Cognitive Status: Within Functional Limits for tasks assessed                                 General Comments: Complicated d/t HOH.  Answers questions appropriately.        Exercises      General Comments  Pt agreeable to therapy session.      Pertinent Vitals/Pain Pain Assessment Pain Assessment: No/denies pain HR WNL during sessions activities.    Home Living  Prior Function            PT Goals (current goals can now be found in the care plan section) Acute Rehab PT Goals Patient Stated Goal: to go home PT Goal Formulation: With patient Time For Goal Achievement: 08/21/23 Potential to Achieve Goals: Good Progress towards PT goals: Progressing toward goals    Frequency    Min 1X/week      PT Plan      Co-evaluation              AM-PAC PT "6 Clicks" Mobility   Outcome Measure  Help needed turning from your back to your side  while in a flat bed without using bedrails?: None Help needed moving from lying on your back to sitting on the side of a flat bed without using bedrails?: None Help needed moving to and from a bed to a chair (including a wheelchair)?: A Little Help needed standing up from a chair using your arms (e.g., wheelchair or bedside chair)?: A Little Help needed to walk in hospital room?: A Little Help needed climbing 3-5 steps with a railing? : A Little 6 Click Score: 20    End of Session Equipment Utilized During Treatment: Gait belt Activity Tolerance: Patient tolerated treatment well Patient left: in bed;with call bell/phone within reach;with bed alarm set;with nursing/sitter in room;with family/visitor present Nurse Communication: Mobility status;Precautions PT Visit Diagnosis: Other abnormalities of gait and mobility (R26.89)     Time: 6045-4098 PT Time Calculation (min) (ACUTE ONLY): 23 min  Charges:    $Therapeutic Activity: 23-37 mins PT General Charges $$ ACUTE PT VISIT: 1 Visit                     Hendricks Limes, PT 08/09/23, 12:29 PM

## 2023-08-09 NOTE — Plan of Care (Signed)
  Problem: Activity: Goal: Risk for activity intolerance will decrease Outcome: Progressing   Problem: Safety: Goal: Ability to remain free from injury will improve Outcome: Progressing   Problem: Skin Integrity: Goal: Risk for impaired skin integrity will decrease Outcome: Progressing   

## 2023-08-10 ENCOUNTER — Inpatient Hospital Stay: Payer: Medicare Other

## 2023-08-10 DIAGNOSIS — E871 Hypo-osmolality and hyponatremia: Secondary | ICD-10-CM | POA: Diagnosis not present

## 2023-08-10 LAB — CBC
HCT: 34.4 % — ABNORMAL LOW (ref 36.0–46.0)
Hemoglobin: 11.6 g/dL — ABNORMAL LOW (ref 12.0–15.0)
MCH: 29.1 pg (ref 26.0–34.0)
MCHC: 33.7 g/dL (ref 30.0–36.0)
MCV: 86.4 fL (ref 80.0–100.0)
Platelets: 248 10*3/uL (ref 150–400)
RBC: 3.98 MIL/uL (ref 3.87–5.11)
RDW: 14.1 % (ref 11.5–15.5)
WBC: 9.6 10*3/uL (ref 4.0–10.5)
nRBC: 0 % (ref 0.0–0.2)

## 2023-08-10 LAB — BASIC METABOLIC PANEL
Anion gap: 6 (ref 5–15)
BUN: 18 mg/dL (ref 8–23)
CO2: 20 mmol/L — ABNORMAL LOW (ref 22–32)
Calcium: 8 mg/dL — ABNORMAL LOW (ref 8.9–10.3)
Chloride: 107 mmol/L (ref 98–111)
Creatinine, Ser: 0.95 mg/dL (ref 0.44–1.00)
GFR, Estimated: 54 mL/min — ABNORMAL LOW (ref >60)
Glucose, Bld: 98 mg/dL (ref 70–99)
Potassium: 3.4 mmol/L — ABNORMAL LOW (ref 3.5–5.1)
Sodium: 133 mmol/L — ABNORMAL LOW (ref 135–145)

## 2023-08-10 LAB — PHOSPHORUS: Phosphorus: 3.5 mg/dL (ref 2.5–4.6)

## 2023-08-10 LAB — MAGNESIUM: Magnesium: 2.2 mg/dL (ref 1.7–2.4)

## 2023-08-10 MED ORDER — AMLODIPINE BESYLATE 10 MG PO TABS
10.0000 mg | ORAL_TABLET | Freq: Every day | ORAL | Status: DC
  Administered 2023-08-11 – 2023-08-12 (×2): 10 mg via ORAL
  Filled 2023-08-10 (×2): qty 1

## 2023-08-10 MED ORDER — IPRATROPIUM-ALBUTEROL 0.5-2.5 (3) MG/3ML IN SOLN
3.0000 mL | Freq: Four times a day (QID) | RESPIRATORY_TRACT | Status: DC | PRN
  Administered 2023-08-11: 3 mL via RESPIRATORY_TRACT
  Filled 2023-08-10: qty 3

## 2023-08-10 MED ORDER — IPRATROPIUM-ALBUTEROL 0.5-2.5 (3) MG/3ML IN SOLN
3.0000 mL | Freq: Once | RESPIRATORY_TRACT | Status: AC
  Administered 2023-08-10: 3 mL via RESPIRATORY_TRACT
  Filled 2023-08-10: qty 3

## 2023-08-10 MED ORDER — SODIUM CHLORIDE 0.9 % IV SOLN: INTRAVENOUS | Status: AC

## 2023-08-10 MED ORDER — POLYETHYLENE GLYCOL 3350 17 G PO PACK
17.0000 g | PACK | Freq: Every day | ORAL | Status: DC | PRN
  Administered 2023-08-11: 17 g via ORAL
  Filled 2023-08-10: qty 1

## 2023-08-10 MED ORDER — AMLODIPINE BESYLATE 5 MG PO TABS
5.0000 mg | ORAL_TABLET | Freq: Once | ORAL | Status: AC
  Administered 2023-08-10: 5 mg via ORAL
  Filled 2023-08-10: qty 1

## 2023-08-10 MED ORDER — MENTHOL 3 MG MT LOZG
1.0000 | LOZENGE | OROMUCOSAL | Status: DC | PRN
  Filled 2023-08-10: qty 9

## 2023-08-10 MED ORDER — POTASSIUM CHLORIDE 20 MEQ PO PACK
40.0000 meq | PACK | Freq: Once | ORAL | Status: AC
  Administered 2023-08-10: 40 meq via ORAL
  Filled 2023-08-10: qty 2

## 2023-08-10 NOTE — Progress Notes (Signed)
Physical Therapy Treatment Patient Details Name: Autumn Johnston MRN: [id number] DOB: [birth year] Today's Date: 08/10/2023   History of Present Illness Autumn Johnston is a 89 y.o. female with medical history significant for essential hypertension, hyperlipidemia, CKD lllb, anemia of chronic disease, B12 deficiency, colon cancer s/p colostomy with parastomal hernia, who was brought to the ED with concerns for decreased oral intake for several days and decreased urinary input.    PT Comments  Pt resting in bed upon PT arrival; agreeable to therapy.  During session pt CGA with transfers and ambulation 70 feet with RW use.  Limited activity d/t pt fatigue.  Will continue to focus on strengthening and progressive functional mobility during hospitalization.    If plan is discharge home, recommend the following: A little help with bathing/dressing/bathroom;Assistance with cooking/housework;Assist for transportation;Help with stairs or ramp for entrance;A little help with walking and/or transfers   Can travel by private vehicle      Yes  Equipment Recommendations  Rolling walker (2 wheels)    Recommendations for Other Services       Precautions / Restrictions Precautions Precautions: Fall Precaution Comments: LLQ ostomy Restrictions Weight Bearing Restrictions: No     Mobility  Bed Mobility Overal bed mobility: Needs Assistance Bed Mobility: Supine to Sit, Sit to Supine     Supine to sit: Modified independent (Device/Increase time) Sit to supine: Min assist (assist for LE's)   General bed mobility comments: Increased effort/time to bring B LE's into bed    Transfers Overall transfer level: Needs assistance Equipment used: Rolling walker (2 wheels) Transfers: Sit to/from Stand Sit to Stand: Contact guard assist           General transfer comment: x1 transfer from bed    Ambulation/Gait Ambulation/Gait assistance: Contact guard assist Gait Distance (Feet): 70  Feet Assistive device: Rolling walker (2 wheels) Gait Pattern/deviations: Step-through pattern Gait velocity: decreased     General Gait Details: mild flexed trunk posture; steady ambulating with RW use   Stairs             Wheelchair Mobility     Tilt Bed    Modified Rankin (Stroke Patients Only)       Balance Overall balance assessment: Needs assistance Sitting-balance support: No upper extremity supported, Feet supported Sitting balance-Leahy Scale: Good Sitting balance - Comments: steady reaching within BOS   Standing balance support: Bilateral upper extremity supported, During functional activity, Reliant on assistive device for balance Standing balance-Leahy Scale: Good Standing balance comment: steady ambulating with RW use                            Cognition Arousal: Alert Behavior During Therapy: WFL for tasks assessed/performed Overall Cognitive Status: Within Functional Limits for tasks assessed                                          Exercises      General Comments  Nursing cleared pt for participation in physical therapy.  Pt agreeable to PT session.      Pertinent Vitals/Pain Pain Assessment Pain Assessment: No/denies pain Vitals (HR and SpO2 on room air) stable and WFL throughout treatment session.    Home Living  Prior Function            PT Goals (current goals can now be found in the care plan section) Acute Rehab PT Goals Patient Stated Goal: to go home PT Goal Formulation: With patient Time For Goal Achievement: 08/21/23 Potential to Achieve Goals: Good Progress towards PT goals: Progressing toward goals    Frequency    Min 1X/week      PT Plan      Co-evaluation              AM-PAC PT "6 Clicks" Mobility   Outcome Measure  Help needed turning from your back to your side while in a flat bed without using bedrails?: None Help needed moving  from lying on your back to sitting on the side of a flat bed without using bedrails?: None Help needed moving to and from a bed to a chair (including a wheelchair)?: A Little Help needed standing up from a chair using your arms (e.g., wheelchair or bedside chair)?: A Little Help needed to walk in hospital room?: A Little Help needed climbing 3-5 steps with a railing? : A Little 6 Click Score: 20    End of Session Equipment Utilized During Treatment: Gait belt (up high away from ostomy) Activity Tolerance: Patient tolerated treatment well Patient left: in bed;with call bell/phone within reach;with bed alarm set Nurse Communication: Mobility status PT Visit Diagnosis: Other abnormalities of gait and mobility (R26.89)     Time: 4742-5956 PT Time Calculation (min) (ACUTE ONLY): 16 min  Charges:    $Therapeutic Activity: 8-22 mins PT General Charges $$ ACUTE PT VISIT: 1 Visit                     Hendricks Limes, PT 08/10/23, 4:43 PM

## 2023-08-10 NOTE — Progress Notes (Addendum)
[facility] Progress Note  Patient: Autumn Johnston    MWN:[id number]  DOA: 08/06/2023     Date of Service: the patient was seen and examined on 08/10/2023  Chief Complaint  Patient presents with   Nausea   Brief hospital course: Maleah Treichler is a 89 y.o. female with medical history significant for essential hypertension, hyperlipidemia, CKD lllb, anemia of chronic disease, B12 deficiency, colon cancer s/p colostomy with parastomal hernia, who was brought to the ED with concerns for decreased oral intake for several days and decreased urinary input.  She has mild lower abdominal tenderness.  Has no vomiting or diarrhea.  Denies cough, chest pain or shortness of breath. ED Course and data review: BP 208/95, mild tachypnea to 22 with otherwise normal vitals. Labs: Notable for sodium of 123.  Creatinine at baseline at 1.14.  Urinalysis without signs of infection.  CBC normal, likely normal limits. Urine sodium 130 urine osmolality 372, serum osmolality pending EKG, personally viewed and interpreted with sinus rhythm at 91 with nonspecific ST-T wave changes. Chest x-ray pending CT abdomen and pelvis with multiple nonacute findings except for possible new mild compression deformity at L4, further detailed below: MPRESSION:  1. Mild right-sided hydronephrosis of uncertain etiology. No obstructing calculus identified. 2. Cholelithiasis. 3. Stable common bile duct dilatation.  Correlate with lab values. 4. Unchanged left lower quadrant colostomy with large peristomal hernia containing nondilated bowel. 5. New mild compression deformity of L4, age indeterminate. Correlate for point tenderness. 6. Multiple lesions, including left Bosniak I benign renal cyst measuring 1.5 cm. No follow-up imaging is recommended.   Patient started on hydration with NS and given Zofran Hospitalist consulted for admission.   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 Sodium 123-->>>133 CT abdomen and pelvis does not reveal an etiology CXR negative for any acute findings Continue NS IV fluid S/p Sodium chloride tablet 1 g p.o. 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 8/20 colostomy is working now, avoid excessive laxatives.   Hypomagnesemia, mag repleted. Monitor electrolytes daily    Hypertensive urgency BP 208/95 on admission No antihypertensive seen on home med list 8/20 increased Amlodipine 10 mg p.o. daily Use hydralazine p.o. versus IV as per SBP Monitor BP and titrate medications accordingly   Hydronephrosis of right kidney Incidental finding on CT, with no obstructing calculus identified Patient also has microscopic hematuria but otherwise unremarkable UA, no infection Can consider renal ultrasound, urology consult but in view of advanced age of 51, suspect conservative management/surveillance for worsening abdominal pain be recommended Intake output monitoring with bladder scan as needed   Protein calorie malnutrition Frailty Nutritionist consult, PT consult   Closed compression fracture of L4 vertebra  Age-indeterminate fracture seen Pain control PT eval done recommended home health   Stage 3b chronic kidney disease  Creatinine at baseline at 1.17   Body mass index is 18.15 kg/m.  Interventions:  Diet: Regular diet DVT Prophylaxis: Subcutaneous Lovenox   Advance goals of care discussion: DNR  Family Communication: family was not present at bedside, at the time of interview.  The pt provided permission to discuss medical plan with the family. Opportunity was given  to ask  question and all questions were answered satisfactorily.  8/20 d/w patient's daughter over the phone.     Disposition:  Pt is from Home, admitted with hyponatremia, and colostomy was not working.  Sodium level is improving, colostomy is working now.  Patient is stable to discharge. Seen by PT and OT, recommended home health.  TOC is following, patient can be discharged to home tomorrow.    Subjective: No significant events overnight, colostomy bag is working but is being more frequently felt so patient is concerned about that.  Denied any abdominal pain, no nausea vomiting.    Physical Exam: General: NAD, lying comfortably Appear in no distress, affect appropriate Eyes: PERRLA ENT: Oral Mucosa Clear, dry Neck: no JVD,  Cardiovascular: S1 and S2 Present, no Murmur,  Respiratory: good respiratory effort, Bilateral Air entry equal and Decreased, no Crackles, no wheezes Abdomen: Bowel Sound present, Soft and no tenderness, LLQ colostomy intact Skin: no rashes Extremities: no Pedal edema, no calf tenderness Neurologic: without any new focal findings Gait not checked due to patient safety concerns  Vitals:   08/09/23 2027 08/10/23 0455 08/10/23 0808 08/10/23 1551  BP: (!) 163/69 (!) 151/76 (!) 181/71 (!) 158/72  Pulse: 84 84 83 81  Resp: 20 18 16 16   Temp: 98.2 F (36.8 C) 98 F (36.7 C) 98.3 F (36.8 C) 98.2 F (36.8 C)  TempSrc: Oral Oral Oral   SpO2: 97% 96% 97% 100%  Weight:      Height:        Intake/Output Summary (Last 24 hours) at 08/10/2023 1640 Last data filed at 08/10/2023 1300 Gross per 24 hour  Intake 120 ml  Output 3350 ml  Net -3230 ml   Filed Weights   08/07/23 0115 08/08/23 0434 08/09/23 0351  Weight: 44 kg 46 kg 45 kg    Data Reviewed: I have personally reviewed and interpreted daily labs, tele strips, imagings as discussed above. I reviewed all nursing notes, pharmacy notes, vitals, pertinent old records I have discussed plan of care as  described above with RN and patient/family.  CBC: Recent Labs  Lab 08/06/23 2034 08/08/23 0421 08/09/23 0514 08/10/23 0627  WBC 7.6 6.4 7.5 9.6  HGB 12.7 11.6* 11.0* 11.6*  HCT 37.6 34.4* 33.0* 34.4*  MCV 86.2 85.4 87.3 86.4  PLT 255 215 214 248   Basic Metabolic Panel: Recent Labs  Lab 08/07/23 0836 08/07/23 1616 08/08/23 0102 08/08/23 0421 08/09/23 0514 08/10/23 0627  NA 124* 126* 126*  --  131* 133*  K 4.2 3.6 3.8  --  3.7 3.4*  CL 94* 93* 94*  --  103 107  CO2 22 23 22   --  25 20*  GLUCOSE 89 108* 101*  --  90 98  BUN 16 19 21   --  23 18  CREATININE 1.03* 1.21* 1.18*  --  1.08* 0.95  CALCIUM 8.4* 8.5* 8.6*  --  8.1* 8.0*  MG 1.8  --   --  1.5* 2.6* 2.2  PHOS 3.1  --   --  2.6 2.9 3.5    Studies: DG Abd Portable 1V-Small Bowel Obstruction Protocol-initial, 8 hr delay  Result Date: 08/10/2023 CLINICAL DATA:  8 hour delayed small-bowel follow-through film. EXAM: PORTABLE ABDOMEN - 1 VIEW COMPARISON:  Flat plate abdomen 98/10/9146, CT with IV contrast 08/06/2023 FINDINGS: The bowel gas pattern is nonobstructive. Precipitated barium is noted within a contracted stomach. There is scattered barium within small bowel segments in the mid to lower abdomen, some of which  is precipitated. There is a left lower quadrant colostomy with large peristomal hernia containing normal caliber small bowel segments some of which contain barium. There is scattered contrast within the normal caliber hepatic flexure and transverse colon. Numerous surgical clips line the pelvic sidewalls. There is osteopenia, degenerative change of the spine and hips, with partially visible left hip nailing hardware. IMPRESSION: 1. No evidence of bowel obstruction. 2. Left lower quadrant colostomy with large peristomal hernia containing normal caliber small bowel segments. 3. Precipitated barium within a contracted stomach. Electronically Signed   By: Almira Bar M.D.   On: 08/10/2023 04:40    Scheduled Meds:   [START ON 08/11/2023] amLODipine  10 mg Oral Daily   enoxaparin (LOVENOX) injection  30 mg Subcutaneous Q24H   feeding supplement  237 mL Oral BID BM   multivitamin with minerals  1 tablet Oral Daily   Continuous Infusions:  sodium chloride 100 mL/hr at 08/10/23 0025   PRN Meds: acetaminophen **OR** acetaminophen, ALPRAZolam, Gerhardt's butt cream, hydrALAZINE **OR** hydrALAZINE, HYDROcodone-acetaminophen, menthol-cetylpyridinium, ondansetron **OR** ondansetron (ZOFRAN) IV, polyethylene glycol, zolpidem  Time spent: 35 minutes  Author: Gillis Santa. MD [facility] 08/10/2023 4:40 PM  To reach On-call, see care teams to locate the attending and reach out to them via [email or web address]. If 7PM-7AM, please contact night-coverage If you still have difficulty reaching the attending provider, please page the [facility] (Director on Call) for [facility] on amion for assistance.

## 2023-08-10 NOTE — TOC Progression Note (Signed)
Transition of Care [facility]) - Progression Note    Patient Details  Name: Autumn Johnston MRN: [id number] Date of Birth: March 03, 1922  [facility] of [facility]) CM/SW Contact  Truddie Hidden, RN Phone Number: 08/10/2023, 1:45 PM  Clinical Narrative:    TOC continuing ongoing assessments for needs that may arise and changes in discharge plan.     Barriers to Discharge: Continued Medical Work up  Expected Discharge Plan and Services                         DME Arranged:  (Has RW at home)         HH Arranged:  (TBD) HH Agency: [facility] Date HH Agency Contacted: 08/08/23 Time HH Agency Contacted: 1013 Representative spoke with at [facility] Agency: Barbara Cower   Social Determinants of Health (SDOH) Interventions SDOH Screenings   Food Insecurity: No Food Insecurity (08/08/2023)  Housing: Low Risk  (08/08/2023)  Transportation Needs: No Transportation Needs (08/08/2023)  Utilities: Not At Risk (08/08/2023)  Tobacco Use: Low Risk  (08/08/2023)    Readmission Risk Interventions    03/31/2022   11:40 AM 09/17/2021    9:37 AM  Readmission Risk Prevention Plan  Transportation Screening Complete Complete  PCP or Specialist Appt within 3-5 Days Complete   HRI or Home Care Consult Complete   Social Work Consult for Recovery Care Planning/Counseling Complete   Palliative Care Screening Not Applicable   Medication Review [employer]) Complete Complete  PCP or Specialist appointment within 3-5 days of discharge  Complete  HRI or Home Care Consult  Complete  SW Recovery Care/Counseling Consult  Not Complete  SW Consult Not Complete Comments  RNCM assigned to case  Palliative Care Screening  Not Applicable  Comments  na  Skilled Nursing Facility  Complete

## 2023-08-10 NOTE — Care Management Important Message (Signed)
Important Message  Patient Details  Name: Autumn Johnston MRN: [id number] Date of Birth: Sep 06, 1922   Medicare Important Message Given:  Yes     Johnell Comings 08/10/2023, 11:45 AM

## 2023-08-11 ENCOUNTER — Inpatient Hospital Stay: Payer: Medicare Other

## 2023-08-11 DIAGNOSIS — E871 Hypo-osmolality and hyponatremia: Secondary | ICD-10-CM | POA: Diagnosis not present

## 2023-08-11 LAB — BASIC METABOLIC PANEL
Anion gap: 6 (ref 5–15)
BUN: 20 mg/dL (ref 8–23)
CO2: 21 mmol/L — ABNORMAL LOW (ref 22–32)
Calcium: 8.2 mg/dL — ABNORMAL LOW (ref 8.9–10.3)
Chloride: 106 mmol/L (ref 98–111)
Creatinine, Ser: 0.98 mg/dL (ref 0.44–1.00)
GFR, Estimated: 52 mL/min — ABNORMAL LOW (ref >60)
Glucose, Bld: 83 mg/dL (ref 70–99)
Potassium: 3.6 mmol/L (ref 3.5–5.1)
Sodium: 133 mmol/L — ABNORMAL LOW (ref 135–145)

## 2023-08-11 LAB — PHOSPHORUS: Phosphorus: 2.8 mg/dL (ref 2.5–4.6)

## 2023-08-11 LAB — CBC
HCT: 34.1 % — ABNORMAL LOW (ref 36.0–46.0)
Hemoglobin: 11.2 g/dL — ABNORMAL LOW (ref 12.0–15.0)
MCH: 29 pg (ref 26.0–34.0)
MCHC: 32.8 g/dL (ref 30.0–36.0)
MCV: 88.3 fL (ref 80.0–100.0)
Platelets: 220 10*3/uL (ref 150–400)
RBC: 3.86 MIL/uL — ABNORMAL LOW (ref 3.87–5.11)
RDW: 14.2 % (ref 11.5–15.5)
WBC: 7.5 10*3/uL (ref 4.0–10.5)
nRBC: 0 % (ref 0.0–0.2)

## 2023-08-11 LAB — MAGNESIUM: Magnesium: 2.1 mg/dL (ref 1.7–2.4)

## 2023-08-11 MED ORDER — ISOSORBIDE MONONITRATE ER 30 MG PO TB24
15.0000 mg | ORAL_TABLET | Freq: Every day | ORAL | Status: DC
  Administered 2023-08-12: 15 mg via ORAL
  Filled 2023-08-11: qty 1

## 2023-08-11 MED ORDER — BENZONATATE 100 MG PO CAPS
200.0000 mg | ORAL_CAPSULE | Freq: Three times a day (TID) | ORAL | Status: DC
  Administered 2023-08-11 – 2023-08-12 (×3): 200 mg via ORAL
  Filled 2023-08-11 (×3): qty 2

## 2023-08-11 NOTE — Plan of Care (Signed)
  Problem: Safety: Goal: Ability to remain free from injury will improve Outcome: Progressing   Problem: Skin Integrity: Goal: Risk for impaired skin integrity will decrease Outcome: Progressing   

## 2023-08-11 NOTE — Progress Notes (Signed)
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 D/c sertraline. Na level is stable from day prior. CT abdomen and pelvis does not reveal an etiology. S/p salt tabs x 3 days   Colon cancer: w/ ostomy & chronic parastomal hernia as per gen surg. No concern for obstruction, not a surgical candidate & no indication for any surgical intervention as per gen surg. Continue w/ wound care. 08/11/23: hernia enlarged compared to this morning as per pt's nurse but no abd pain. XR abd ordered    Hypomagnesemia: WNL today   Hypertensive urgency: urgency resolved but still w/ HTN. Continue on amlodipine. Will start imdur. Hydralazine prn    Hydronephrosis of right kidney: incidental finding on CT, with no obstructing calculus identified. Cr is WNL. Will continue w/ surveillance/conservative management w/ repeat US renal an outpatient    Protein calorie malnutrition: nutrition consulted   Closed compression fracture of L4 vertebra: age indeterminate. PT recs HH. Norco prn   CKDIIIb: Cr is better than baseline. Will continue to monitor  Creatinine at baseline at 1.17         DVT prophylaxis: lovenox  Code Status: DNR  Family Communication:  Disposition Plan:d/c home w/ HH  Level of care: Med-Surg Status is: Inpatient Remains inpatient appropriate because: d/c held secondary to HTN   Consultants:  Gen surg   Procedures:   Antimicrobials:   Subjective: Pt c/o intermittent cough  Objective: Vitals:   08/10/23 1919  08/10/23 2059 08/10/23 2255 08/11/23 0349  BP:  (!) 179/80 (!) 148/64 (!) 169/78  Pulse:  85 73 72  Resp:  20  18  Temp:  98.4 F (36.9 C)  98.1 F (36.7 C)  TempSrc:  Oral  Oral  SpO2: 98% 98%  98%  Weight:    44.3 kg  Height:        Intake/Output Summary (Last 24 hours) at 08/11/2023 0807 Last data filed at 08/11/2023 5784 Gross per 24 hour  Intake 1780.34 ml  Output 1700 ml  Net 80.34 ml   Filed Weights   08/08/23 0434 08/09/23 0351 08/11/23 0349  Weight: 46 kg 45 kg 44.3 kg    Examination:  General exam: Appears calm and comfortable  Respiratory system: diminished breath sounds b/l Cardiovascular system: S1 & S2+. No rubs, gallops or clicks Gastrointestinal system: Abdomen is nondistended, soft and nontender.  Normal bowel sounds heard. Central nervous system: Alert and awake. Moves all extremities  Psychiatry: Judgement and insight appears at baseline. Flat mood and affect    Data Reviewed: I have personally reviewed following labs and imaging studies  CBC: Recent Labs  Lab 08/06/23 2034 08/08/23 0421 08/09/23 0514 08/10/23 0627 08/11/23 0401  WBC 7.6 6.4 7.5 9.6 7.5  HGB 12.7 11.6* 11.0* 11.6* 11.2*  HCT 37.6 34.4* 33.0* 34.4* 34.1*  MCV 86.2 85.4 87.3 86.4 88.3  PLT 255 215 214 248 220   Basic Metabolic Panel: Recent Labs  Lab 08/07/23 0836 08/07/23 1616 08/08/23 0102 08/08/23 0421 08/09/23 6962 08/10/23 9528  08/11/23 0401  NA 124* 126* 126*  --  131* 133* 133*  K 4.2 3.6 3.8  --  3.7 3.4* 3.6  CL 94* 93* 94*  --  103 107 106  CO2 22 23 22   --  25 20* 21*  GLUCOSE 89 108* 101*  --  90 98 83  BUN 16 19 21   --  23 18 20   CREATININE 1.03* 1.21* 1.18*  --  1.08* 0.95 0.98  CALCIUM 8.4* 8.5* 8.6*  --  8.1* 8.0* 8.2*  MG 1.8  --   --  1.5* 2.6* 2.2 2.1  PHOS 3.1  --   --  2.6 2.9 3.5 2.8   GFR: Estimated Creatinine Clearance: 22.4 mL/min (by C-G formula based on SCr of 0.98 mg/dL). Liver Function Tests: Recent Labs  Lab 08/06/23 2034  AST  25  ALT 14  ALKPHOS 49  BILITOT 1.0  PROT 7.6  ALBUMIN 4.0   Recent Labs  Lab 08/06/23 2034  LIPASE 47   No results for input(s): "AMMONIA" in the last 168 hours. Coagulation Profile: No results for input(s): "INR", "PROTIME" in the last 168 hours. Cardiac Enzymes: No results for input(s): "CKTOTAL", "CKMB", "CKMBINDEX", "TROPONINI" in the last 168 hours. BNP (last 3 results) No results for input(s): "PROBNP" in the last 8760 hours. HbA1C: No results for input(s): "HGBA1C" in the last 72 hours. CBG: No results for input(s): "GLUCAP" in the last 168 hours. Lipid Profile: No results for input(s): "CHOL", "HDL", "LDLCALC", "TRIG", "CHOLHDL", "LDLDIRECT" in the last 72 hours. Thyroid Function Tests: No results for input(s): "TSH", "T4TOTAL", "FREET4", "T3FREE", "THYROIDAB" in the last 72 hours. Anemia Panel: No results for input(s): "VITAMINB12", "FOLATE", "FERRITIN", "TIBC", "IRON", "RETICCTPCT" in the last 72 hours. Sepsis Labs: No results for input(s): "PROCALCITON", "LATICACIDVEN" in the last 168 hours.  Recent Results (from the past 240 hour(s))  SARS Coronavirus 2 by RT PCR (hospital order, performed in [facility] hospital lab) *cepheid single result test* Anterior Nasal Swab     Status: None   Collection Time: 08/07/23  2:00 AM   Specimen: Anterior Nasal Swab  Result Value Ref Range Status   SARS Coronavirus 2 by RT PCR NEGATIVE NEGATIVE Final    Comment: (NOTE) SARS-CoV-2 target nucleic acids are NOT DETECTED.  The SARS-CoV-2 RNA is generally detectable in upper and lower respiratory specimens during the acute phase of infection. The lowest concentration of SARS-CoV-2 viral copies this assay can detect is 250 copies / mL. A negative result does not preclude SARS-CoV-2 infection and should not be used as the sole basis for treatment or other patient management decisions.  A negative result may occur with improper specimen collection / handling, submission of  specimen other than nasopharyngeal swab, presence of viral mutation(s) within the areas targeted by this assay, and inadequate number of viral copies (<250 copies / mL). A negative result must be combined with clinical observations, patient history, and epidemiological information.  Fact Sheet for Patients:   RoadLapTop.co.za  Fact Sheet for Healthcare Providers: http://kim-miller.com/  This test is not yet approved or  cleared by the [place] FDA and has been authorized for detection and/or diagnosis of SARS-CoV-2 by FDA under an Emergency Use Authorization (EUA).  This EUA will remain in effect (meaning this test can be used) for the duration of the COVID-19 declaration under Section 564(b)(1) of the Act, 21 U.S.C. section 360bbb-3(b)(1), unless the authorization is terminated or revoked sooner.  Performed at [facility], [address] [place], [place] [address]          Radiology Studies: DG Abd Portable 1V-Small Bowel Obstruction Protocol-initial, 8 hr delay  Result Date: 08/10/2023 CLINICAL DATA:  8 hour delayed small-bowel follow-through film. EXAM: PORTABLE ABDOMEN - 1 VIEW COMPARISON:  Flat plate abdomen 14/78/2956, CT with IV contrast 08/06/2023 FINDINGS: The bowel gas pattern is nonobstructive. Precipitated barium is noted within a contracted stomach. There is scattered barium within small bowel segments in the mid to lower abdomen, some of which is precipitated. There is a left lower quadrant colostomy with large peristomal hernia containing normal caliber small bowel segments some of which contain barium. There is scattered contrast within the normal caliber hepatic flexure and transverse colon. Numerous surgical clips line the pelvic sidewalls. There is osteopenia, degenerative change of the spine and hips, with partially visible left hip nailing hardware. IMPRESSION: 1. No evidence of bowel obstruction. 2.  Left lower quadrant colostomy with large peristomal hernia containing normal caliber small bowel segments. 3. Precipitated barium within a contracted stomach. Electronically Signed   By: Almira Bar M.D.   On: 08/10/2023 04:40        Scheduled Meds:  amLODipine  10 mg Oral Daily   enoxaparin (LOVENOX) injection  30 mg Subcutaneous Q24H   feeding supplement  237 mL Oral BID BM   multivitamin with minerals  1 tablet Oral Daily   Continuous Infusions:  sodium chloride 75 mL/hr at 08/10/23 1757     LOS: 4 days    Time spent: 25 mins     Charise Killian, MD [facility] Pager [phone]  If 7PM-7AM, please contact night-coverage [email or web address] 08/11/2023, 8:07 AM

## 2023-08-11 NOTE — TOC Progression Note (Signed)
Transition of Care [facility]) - Progression Note    Patient Details  Name: Autumn Johnston MRN: [id number] Date of Birth: 12-04-1922  Transition of Care [facility]) CM/SW Contact  Margarito Liner, LCSW Phone Number: 08/11/2023, 11:18 AM  Clinical Narrative: No family at bedside. CSW called daughter to discuss DME recommendations. Patient has two RW's and a 3-in-1 at home already.       Barriers to Discharge: Continued Medical Work up  Expected Discharge Plan and Services                         DME Arranged:  (Has RW at home)         HH Arranged:  (TBD) HH Agency: [facility] Date HH Agency Contacted: 08/08/23 Time HH Agency Contacted: 1013 Representative spoke with at [facility] Agency: Barbara Cower   Social Determinants of Health (SDOH) Interventions SDOH Screenings   Food Insecurity: No Food Insecurity (08/08/2023)  Housing: Low Risk  (08/08/2023)  Transportation Needs: No Transportation Needs (08/08/2023)  Utilities: Not At Risk (08/08/2023)  Tobacco Use: Low Risk  (08/08/2023)    Readmission Risk Interventions    03/31/2022   11:40 AM 09/17/2021    9:37 AM  Readmission Risk Prevention Plan  Transportation Screening Complete Complete  PCP or Specialist Appt within 3-5 Days Complete   HRI or Home Care Consult Complete   Social Work Consult for Recovery Care Planning/Counseling Complete   Palliative Care Screening Not Applicable   Medication Review [employer]) Complete Complete  PCP or Specialist appointment within 3-5 days of discharge  Complete  HRI or Home Care Consult  Complete  SW Recovery Care/Counseling Consult  Not Complete  SW Consult Not Complete Comments  RNCM assigned to case  Palliative Care Screening  Not Applicable  Comments  na  Skilled Nursing Facility  Complete

## 2023-08-11 NOTE — Progress Notes (Signed)
Occupational Therapy Treatment Patient Details Name: Autumn Johnston MRN: [id number] DOB: January 21, 1922 Today's Date: 08/11/2023   History of present illness Pt. is a 89 y.o. female with medical history significant for essential hypertension, hyperlipidemia, CKD lllb, anemia of chronic disease, B12 deficiency, colon cancer s/p colostomy with parastomal hernia, who was brought to the ED with concerns for decreased oral intake for several days and decreased urinary input.   OT comments  Pt. is independent UE ADLs, and CGA LE ADLs. Pt./caregiver education was provided about anticipated ADL/IADL needs upon return home. Pt. Daughter reports Pt. Family take turns staying with Pt., and provide all meals for the Pt. Pt. And daughter report their primary concern for the right now is the colostomy care. Pt. was perseverating on the colotomy care. Nursing was notified of Pt., and daughter concerns. No additional follow-up OT services anticipated upon d/c.       If plan is discharge home, recommend the following:  Direct supervision/assist for financial management;Assistance with cooking/housework   Equipment Recommendations       Recommendations for Other Services      Precautions / Restrictions Precautions Precautions: Fall Precaution Comments: LLQ ostomy Restrictions Weight Bearing Restrictions: No       Mobility Bed Mobility   Bed Mobility: Supine to Sit     Supine to sit: Modified independent (Device/Increase time)          Transfers       Sit to Stand: Contact guard assist     Step pivot transfers: Contact guard assist           Balance                                           ADL either performed or assessed with clinical judgement   ADL       Grooming: Independent;Set up           Upper Body Dressing : Independent   Lower Body Dressing: Contact guard assist                      Extremity/Trunk Assessment Upper Extremity  Assessment Upper Extremity Assessment: Generalized weakness            Vision Patient Visual Report: No change from baseline     Perception     Praxis      Cognition Arousal: Alert Behavior During Therapy: WFL for tasks assessed/performed Overall Cognitive Status: Within Functional Limits for tasks assessed                                 General Comments: Perseverating on Colostomy care        Exercises      Shoulder Instructions       General Comments      Pertinent Vitals/ Pain       Pain Assessment Pain Assessment: No/denies pain  Home Living                                          Prior Functioning/Environment              Frequency  Min 1X/week        Progress Toward Goals  OT Goals(current goals can now be found in the care plan section)  Progress towards OT goals: Progressing toward goals  Acute Rehab OT Goals Patient Stated Goal: To return home OT Goal Formulation: With patient Time For Goal Achievement: 08/21/23 Potential to Achieve Goals: Good  Plan      Co-evaluation                 AM-PAC OT "6 Clicks" Daily Activity     Outcome Measure   Help from another person eating meals?: None Help from another person taking care of personal grooming?: None Help from another person toileting, which includes using toliet, bedpan, or urinal?: A Little Help from another person bathing (including washing, rinsing, drying)?: A Little Help from another person to put on and taking off regular upper body clothing?: None Help from another person to put on and taking off regular lower body clothing?: A Little 6 Click Score: 21    End of Session    OT Visit Diagnosis: Unsteadiness on feet (R26.81);Muscle weakness (generalized) (M62.81)   Activity Tolerance Patient tolerated treatment well   Patient Left with call bell/phone within reach;with family/visitor present;in bed   Nurse Communication           Time: 4782-9562 OT Time Calculation (min): 22 min  Charges: OT General Charges $OT Visit: 1 Visit OT Treatments $Self Care/Home Management : 8-22 mins  Olegario Messier, MS, OTR/L   Olegario Messier 08/11/2023, 3:38 PM

## 2023-08-11 NOTE — Progress Notes (Signed)
Physical Therapy Treatment Patient Details Name: Autumn Johnston MRN: [id number] DOB: 05-24-1922 Today's Date: 08/11/2023   History of Present Illness Autumn Johnston is a 89 y.o. female with medical history significant for essential hypertension, hyperlipidemia, CKD lllb, anemia of chronic disease, B12 deficiency, colon cancer s/p colostomy with parastomal hernia, who was brought to the ED with concerns for decreased oral intake for several days and decreased urinary input.    PT Comments  Pt pleasant and eager to get up with PT, did not wish to sit up long but looking forward to sitting up for lunch.  Pt with slow but consistent and safe effort, heavy but appropriate use of RW.  Pt's O2 remained in the 90s on room air, does endorse fatigue with the effort - ultimately walked 150-160 ft.  She reports feeling good about her confidence in going home, "I'm never home alone anyway."  Will benefit from continued PT per POC.     If plan is discharge home, recommend the following: A little help with bathing/dressing/bathroom;Assistance with cooking/housework;Assist for transportation;Help with stairs or ramp for entrance;A little help with walking and/or transfers   Can travel by private vehicle        Equipment Recommendations  None recommended by PT    Recommendations for Other Services       Precautions / Restrictions Precautions Precautions: Fall Precaution Comments: LLQ ostomy Restrictions Weight Bearing Restrictions: No     Mobility  Bed Mobility Overal bed mobility: Needs Assistance Bed Mobility: Supine to Sit     Supine to sit: Modified independent (Device/Increase time)     General bed mobility comments: minimal incidental assist to move blankets, but pt able to get to sitting w/o assist    Transfers Overall transfer level: Needs assistance Equipment used: Rolling walker (2 wheels) Transfers: Sit to/from Stand Sit to Stand: Contact guard assist            General transfer comment: pt showed good forward weight shift, appropriate UE use on walker    Ambulation/Gait Ambulation/Gait assistance: Contact guard assist Gait Distance (Feet): 160 Feet Assistive device: Rolling walker (2 wheels)         General Gait Details: pt did well with prolonged ambulation effort, moderate fatigue - requests to sit at ~185ft, mild flexed trunk posture; steady/confident ambulating with RW   Stairs             Wheelchair Mobility     Tilt Bed    Modified Rankin (Stroke Patients Only)       Balance Overall balance assessment: Needs assistance Sitting-balance support: No upper extremity supported, Feet supported Sitting balance-Leahy Scale: Good     Standing balance support: Bilateral upper extremity supported, During functional activity, Reliant on assistive device for balance Standing balance-Leahy Scale: Good Standing balance comment: steady ambulating with RW use                            Cognition Arousal: Alert Behavior During Therapy: WFL for tasks assessed/performed Overall Cognitive Status: Within Functional Limits for tasks assessed                                          Exercises      General Comments        Pertinent Vitals/Pain Pain Assessment Pain Assessment: No/denies pain (denies pain, including  low back)    Home Living                          Prior Function            PT Goals (current goals can now be found in the care plan section) Progress towards PT goals: Progressing toward goals    Frequency    Min 1X/week      PT Plan      Co-evaluation              AM-PAC PT "6 Clicks" Mobility   Outcome Measure  Help needed turning from your back to your side while in a flat bed without using bedrails?: None Help needed moving from lying on your back to sitting on the side of a flat bed without using bedrails?: None Help needed moving to and from  a bed to a chair (including a wheelchair)?: A Little Help needed standing up from a chair using your arms (e.g., wheelchair or bedside chair)?: A Little Help needed to walk in hospital room?: A Little Help needed climbing 3-5 steps with a railing? : A Little 6 Click Score: 20    End of Session Equipment Utilized During Treatment: Gait belt Activity Tolerance: Patient tolerated treatment well Patient left: in bed;with call bell/phone within reach;with bed alarm set Nurse Communication: Mobility status PT Visit Diagnosis: Other abnormalities of gait and mobility (R26.89)     Time: 4010-2725 PT Time Calculation (min) (ACUTE ONLY): 24 min  Charges:    $Gait Training: 8-22 mins $Therapeutic Exercise: 8-22 mins PT General Charges $$ ACUTE PT VISIT: 1 Visit                     Malachi Pro, DPT 08/11/2023, 1:09 PM

## 2023-08-12 DIAGNOSIS — E871 Hypo-osmolality and hyponatremia: Secondary | ICD-10-CM | POA: Diagnosis not present

## 2023-08-12 LAB — CBC
HCT: 32.8 % — ABNORMAL LOW (ref 36.0–46.0)
Hemoglobin: 11.2 g/dL — ABNORMAL LOW (ref 12.0–15.0)
MCH: 29.2 pg (ref 26.0–34.0)
MCHC: 34.1 g/dL (ref 30.0–36.0)
MCV: 85.6 fL (ref 80.0–100.0)
Platelets: 225 10*3/uL (ref 150–400)
RBC: 3.83 MIL/uL — ABNORMAL LOW (ref 3.87–5.11)
RDW: 14.3 % (ref 11.5–15.5)
WBC: 8.5 10*3/uL (ref 4.0–10.5)
nRBC: 0 % (ref 0.0–0.2)

## 2023-08-12 LAB — BASIC METABOLIC PANEL
Anion gap: 7 (ref 5–15)
BUN: 25 mg/dL — ABNORMAL HIGH (ref 8–23)
CO2: 23 mmol/L (ref 22–32)
Calcium: 8.7 mg/dL — ABNORMAL LOW (ref 8.9–10.3)
Chloride: 104 mmol/L (ref 98–111)
Creatinine, Ser: 1.05 mg/dL — ABNORMAL HIGH (ref 0.44–1.00)
GFR, Estimated: 48 mL/min — ABNORMAL LOW (ref >60)
Glucose, Bld: 94 mg/dL (ref 70–99)
Potassium: 3.9 mmol/L (ref 3.5–5.1)
Sodium: 134 mmol/L — ABNORMAL LOW (ref 135–145)

## 2023-08-12 MED ORDER — AMLODIPINE BESYLATE 10 MG PO TABS: 10.0000 mg | ORAL_TABLET | Freq: Every day | ORAL | 0 refills | Status: DC

## 2023-08-12 MED ORDER — BENZONATATE 200 MG PO CAPS: 200.0000 mg | ORAL_CAPSULE | Freq: Three times a day (TID) | ORAL | 0 refills | Status: AC | PRN

## 2023-08-12 MED ORDER — ISOSORBIDE MONONITRATE ER 30 MG PO TB24: 15.0000 mg | ORAL_TABLET | Freq: Every day | ORAL | 0 refills | Status: DC

## 2023-08-12 NOTE — Discharge Summary (Signed)
Physician Discharge Summary  [name] ZOX:[id number] DOB: 03/16/1922 DOA: 08/06/2023  PCP: Marguarite Arbour, MD  Admit date: 08/06/2023 Discharge date: 08/12/2023  Admitted From: home  Disposition:  home w/ home health   Recommendations for Outpatient Follow-up:  Follow up with PCP in 1-2 weeks   Home Health: yes  Equipment/Devices:  Discharge Condition: stable  CODE STATUS: DNR Diet recommendation: Heart Healthy   Brief/Interim Summary: HPI was taken from Dr. Para March: Autumn Johnston is a 89 y.o. female with medical history significant for essential hypertension, hyperlipidemia, CKD lllb, anemia of chronic disease, B12 deficiency, colon cancer s/p colostomy with parastomal hernia, who was brought to the ED with concerns for decreased oral intake for several days and decreased urinary input.  She has mild lower abdominal tenderness.  Has no vomiting or diarrhea.  Denies cough, chest pain or shortness of breath. ED Course and data review: BP 208/95, mild tachypnea to 22 with otherwise normal vitals. Labs: Notable for sodium of 123.  Creatinine at baseline at 1.14.  Urinalysis without signs of infection.  CBC normal, likely normal limits. Urine sodium 130 urine osmolality 372, serum osmolality pending EKG, personally viewed and interpreted with sinus rhythm at 91 with nonspecific ST-T wave changes. Chest x-ray pending CT abdomen and pelvis with multiple nonacute findings except for possible new mild compression deformity at L4, further detailed below: MPRESSION: 1. Mild right-sided hydronephrosis of uncertain etiology. No obstructing calculus identified. 2. Cholelithiasis. 3. Stable common bile duct dilatation.  Correlate with lab values. 4. Unchanged left lower quadrant colostomy with large peristomal hernia containing nondilated bowel. 5. New mild compression deformity of L4, age indeterminate. Correlate for point tenderness. 6. Multiple lesions, including left  Bosniak I benign renal cyst measuring 1.5 cm. No follow-up imaging is recommended.   Patient started on hydration with NS and given Zofran Hospitalist consulted for admission.       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 D/c sertraline. Na level is trending up. CT abdomen and pelvis does not reveal an etiology. S/p salt tabs x 3 days   Colon cancer: w/ ostomy & chronic parastomal hernia as per gen surg. No concern for obstruction, not a surgical candidate & no indication for any surgical intervention as per gen surg. Continue w/ wound care. 08/11/23: hernia enlarged compared to this morning as per pt's nurse but no abd pain. XR abd shows no obstruction. Again, pt denies any abd pain     Hypomagnesemia: WNL today   Hypertensive urgency: urgency resolved but still w/ HTN. Continue on amlodipine & imdur.    Hydronephrosis of right kidney: incidental finding on CT, with no obstructing calculus identified. Cr is WNL. Will continue w/ surveillance/conservative management w/ repeat US renal an outpatient    Protein calorie malnutrition: continue on nutritional supplements    Closed compression fracture of L4 vertebra: age indeterminate. PT recs HH. Norco prn    CKDIIIb: Cr is better than baseline. Will continue to monitor  Creatinine at baseline at 1.17  Discharge Instructions  Discharge Instructions     Diet - low sodium heart healthy   Complete by: As directed  Discharge instructions   Complete by: As directed    F/u w/ PCP in 1-2 weeks   Increase activity slowly   Complete by: As directed    No wound care   Complete by: As directed        Allergies as of 08/12/2023   No Known Allergies      Medication List     STOP taking these medications    sertraline 50 MG tablet Commonly known as: ZOLOFT       TAKE these medications    ALPRAZolam 0.25 MG tablet Commonly known as: XANAX Take 1 tablet (0.25 mg total) by mouth 2 (two) times daily as needed.   amLODipine 10 MG tablet Commonly known as: NORVASC Take 1 tablet (10 mg total) by mouth daily. Start taking on: August 13, 2023   benzonatate 200 MG capsule Commonly known as: TESSALON Take 1 capsule (200 mg total) by mouth 3 (three) times daily as needed for up to 7 days for cough.   isosorbide mononitrate 30 MG 24 hr tablet Commonly known as: IMDUR Take 0.5 tablets (15 mg total) by mouth daily. Start taking on: August 13, 2023   polyethylene glycol 17 g packet Commonly known as: MIRALAX / GLYCOLAX Take 17 g by mouth daily as needed for mild constipation.   simvastatin 20 MG tablet Commonly known as: ZOCOR Take 20 mg by mouth at bedtime.   solifenacin 10 MG tablet Commonly known as: VESICARE Take 10 mg by mouth daily.   zolpidem 10 MG tablet Commonly known as: AMBIEN Take 10 mg by mouth at bedtime as needed.        No Known Allergies  Consultations:    Procedures/Studies: DG Abd Portable 1V  Result Date: 08/11/2023 CLINICAL DATA:  [id number], with abdominal wall hernia. [id number]. Hernia appears to have increased in size. Evaluate for small bowel obstruction. History of CKD and colectomy. EXAM: PORTABLE ABDOMEN - 1 VIEW COMPARISON:  Similar portable supine abdomen film yesterday at 12:46 a.m. FINDINGS: No dilated bowel is seen. Contrast is mostly cleared from the large bowel except for scattered particulate precipitated barium. The large left lower quadrant parastomal hernia contains multiple normal caliber small bowel segments some of which contain barium precipitate. Contrast previously noted in the stomach has also cleared. There remains no supine  evidence of free air. There are numerous pelvic surgical clips and partially visible left hip nailing. There is heavy iliofemoral arterial calcific plaque. No nephrolithiasis is seen. Osteopenia and degenerative change lumbar spine. There are healed left pelvic and proximal left femoral fracture deformities. Mild-to-moderate right hip DJD. IMPRESSION: 1. No dilated bowel is seen. Contrast is mostly cleared from the large bowel except for scattered particulate precipitated barium. 2. Large left lower quadrant parastomal hernia contains multiple normal caliber small bowel segments. 3. Postsurgical changes. Electronically Signed   By: Almira Bar M.D.   On: 08/11/2023 22:56   DG Abd Portable 1V-Small Bowel Obstruction Protocol-initial, 8 hr delay  Result Date: 08/10/2023 CLINICAL DATA:  8 hour delayed small-bowel follow-through film. EXAM: PORTABLE ABDOMEN - 1 VIEW COMPARISON:  Flat plate abdomen 91/47/8295, CT with IV contrast 08/06/2023 FINDINGS: The bowel gas pattern is nonobstructive. Precipitated barium is noted within a contracted stomach. There is scattered barium within small bowel segments in the mid to lower abdomen, some of which is precipitated. There is a left lower quadrant colostomy with large peristomal hernia containing normal caliber small bowel segments some of which contain barium. There is scattered contrast within  the normal caliber hepatic flexure and transverse colon. Numerous surgical clips line the pelvic sidewalls. There is osteopenia, degenerative change of the spine and hips, with partially visible left hip nailing hardware. IMPRESSION: 1. No evidence of bowel obstruction. 2. Left lower quadrant colostomy with large peristomal hernia containing normal caliber small bowel segments. 3. Precipitated barium within a contracted stomach. Electronically Signed   By: Almira Bar M.D.   On: 08/10/2023 04:40   DG Abd 1 View  Result Date: 08/08/2023 CLINICAL DATA:  Small bowel  obstruction. EXAM: ABDOMEN - 1 VIEW COMPARISON:  03/10/2010, abdominal CT 08/06/2023 FINDINGS: Bowel gas pattern is nonobstructive. There are a few air-filled nondilated small bowel loops in the left mid abdomen. No free peritoneal air. Remainder of the exam is unchanged. IMPRESSION: Nonobstructive bowel gas pattern. Electronically Signed   By: Elberta Fortis M.D.   On: 08/08/2023 08:50   DG Chest Portable 1 View  Result Date: 08/06/2023 CLINICAL DATA:  Generalized weakness EXAM: PORTABLE CHEST 1 VIEW COMPARISON:  None Available. FINDINGS: The lungs are hyperinflated in keeping with changes of underlying COPD. No superimposed focal pulmonary infiltrate. Biapical pleuroparenchymal scarring noted. No pneumothorax or pleural effusion. Cardiac size within normal limits. No acute bone abnormality. IMPRESSION: 1. COPD. No active disease. Electronically Signed   By: Helyn Numbers M.D.   On: 08/06/2023 23:50   CT ABDOMEN PELVIS W CONTRAST  Result Date: 08/06/2023 CLINICAL DATA:  Abdominal pain EXAM: CT ABDOMEN AND PELVIS WITH CONTRAST TECHNIQUE: Multidetector CT imaging of the abdomen and pelvis was performed using the standard protocol following bolus administration of intravenous contrast. RADIATION DOSE REDUCTION: This exam was performed according to the departmental dose-optimization program which includes automated exposure control, adjustment of the mA and/or kV according to patient size and/or use of iterative reconstruction technique. CONTRAST:  75mL OMNIPAQUE IOHEXOL 300 MG/ML  SOLN COMPARISON:  CT abdomen and pelvis 09/10/2021 FINDINGS: Lower chest: Atelectasis and scarring is noted in the lung bases. Hepatobiliary: Gallstones are present. Common bile duct is dilated measuring 11 mm similar to prior. Hepatic cysts are present. The largest is in the right lobe of the liver measuring 3.1 cm. Pancreas: Unremarkable. No pancreatic ductal dilatation or surrounding inflammatory changes. Spleen: Normal in size  without focal abnormality. Adrenals/Urinary Tract: Limited evaluation in the pelvis secondary to streak artifact. The bladder is distended, but grossly within normal limits. There is mild right-sided hydronephrosis without obstructing calculus identified. There is a left renal cortical cyst measuring 15 mm. There are additional rounded cortical hypodensities in the kidneys which are too small to characterize, also likely cysts. There some calcifications in the right adrenal gland, unchanged. Left adrenal gland is within normal limits. Stomach/Bowel: There surgical clips in the posterior pelvis. There is a left lower quadrant colostomy with large peristomal hernia containing nondilated bowel similar to the prior study. There is no bowel obstruction, pneumatosis or free air. Appendix is not visualized. Stomach is within normal limits. Vascular/Lymphatic: Aortic atherosclerosis. No enlarged abdominal or pelvic lymph nodes. Reproductive: Uterus and adnexa are not well evaluated secondary to streak artifact in the pelvis. Other: No ascites. Musculoskeletal: Bones are osteopenic. Mild compression deformity of L4 is new from prior. Left-sided hip screw is present. IMPRESSION: 1. Mild right-sided hydronephrosis of uncertain etiology. No obstructing calculus identified. 2. Cholelithiasis. 3. Stable common bile duct dilatation.  Correlate with lab values. 4. Unchanged left lower quadrant colostomy with large peristomal hernia containing nondilated bowel. 5. New mild compression deformity of L4, age indeterminate. Correlate  for point tenderness. 6. Multiple lesions, including left Bosniak I benign renal cyst measuring 1.5 cm. No follow-up imaging is recommended. JACR 2018 Feb; 264-273, Management of the Incidental Renal Mass on CT, [employer] 2021; 814-848, Bosniak Classification of Cystic Renal Masses, Version 2019. Aortic Atherosclerosis (ICD10-I70.0). Electronically Signed   By: Darliss Cheney M.D.   On: 08/06/2023 23:27    (Echo, Carotid, EGD, Colonoscopy, ERCP)    Subjective: Pt c/o fatigue    Discharge Exam: Vitals:   08/12/23 0432 08/12/23 0931  BP: (!) 167/78 (!) 150/68  Pulse: 81 80  Resp:  15  Temp:  98 F (36.7 C)  SpO2:  97%   Vitals:   08/11/23 2052 08/12/23 0244 08/12/23 0432 08/12/23 0931  BP:  (!) 171/75 (!) 167/78 (!) 150/68  Pulse:  84 81 80  Resp:  20  15  Temp:  98.6 F (37 C)  98 F (36.7 C)  TempSrc:    Oral  SpO2: 95% 94%  97%  Weight:      Height:        General: Pt is alert, awake, not in acute distress. Frail appearing  Cardiovascular: S1/S2 +, no rubs, no gallops Respiratory: CTA bilaterally, no wheezing, no rhonchi Abdominal: Soft, NT, ND, bowel sounds + Extremities: no edema, no cyanosis    The results of significant diagnostics from this hospitalization (including imaging, microbiology, ancillary and laboratory) are listed below for reference.     Microbiology: Recent Results (from the past 240 hour(s))  SARS Coronavirus 2 by RT PCR (hospital order, performed in [facility] hospital lab) *cepheid single result test* Anterior Nasal Swab     Status: None   Collection Time: 08/07/23  2:00 AM   Specimen: Anterior Nasal Swab  Result Value Ref Range Status   SARS Coronavirus 2 by RT PCR NEGATIVE NEGATIVE Final    Comment: (NOTE) SARS-CoV-2 target nucleic acids are NOT DETECTED.  The SARS-CoV-2 RNA is generally detectable in upper and lower respiratory specimens during the acute phase of infection. The lowest concentration of SARS-CoV-2 viral copies this assay can detect is 250 copies / mL. A negative result does not preclude SARS-CoV-2 infection and should not be used as the sole basis for treatment or other patient management decisions.  A negative result may occur with improper specimen collection / handling, submission of specimen other than nasopharyngeal swab, presence of viral mutation(s) within the areas targeted by this assay, and inadequate  number of viral copies (<250 copies / mL). A negative result must be combined with clinical observations, patient history, and epidemiological information.  Fact Sheet for Patients:   RoadLapTop.co.za  Fact Sheet for Healthcare Providers: http://kim-miller.com/  This test is not yet approved or  cleared by the [place] FDA and has been authorized for detection and/or diagnosis of SARS-CoV-2 by FDA under an Emergency Use Authorization (EUA).  This EUA will remain in effect (meaning this test can be used) for the duration of the COVID-19 declaration under Section 564(b)(1) of the Act, 21 U.S.C. section 360bbb-3(b)(1), unless the authorization is terminated or revoked sooner.  Performed at [facility], [address] [place], [place] [address]      Labs: BNP (last 3 results) No results for input(s): "BNP" in the last 8760 hours. Basic Metabolic Panel: Recent Labs  Lab 08/07/23 0836 08/07/23 1616 08/08/23 0102 08/08/23 0421 08/09/23 0514 08/10/23 0627 08/11/23 0401 08/12/23 0624  NA 124*   < > 126*  --  131* 133* 133* 134*  K 4.2   < > 3.8  --  3.7 3.4* 3.6 3.9  CL 94*   < > 94*  --  103 107 106 104  CO2 22   < > 22  --  25 20* 21* 23  GLUCOSE 89   < > 101*  --  90 98 83 94  BUN 16   < > 21  --  23 18 20  25*  CREATININE 1.03*   < > 1.18*  --  1.08* 0.95 0.98 1.05*  CALCIUM 8.4*   < > 8.6*  --  8.1* 8.0* 8.2* 8.7*  MG 1.8  --   --  1.5* 2.6* 2.2 2.1  --   PHOS 3.1  --   --  2.6 2.9 3.5 2.8  --    < > = values in this interval not displayed.   Liver Function Tests: Recent Labs  Lab 08/06/23 2034  AST 25  ALT 14  ALKPHOS 49  BILITOT 1.0  PROT 7.6  ALBUMIN 4.0   Recent Labs  Lab 08/06/23 2034  LIPASE 47   No results for input(s): "AMMONIA" in the last 168 hours. CBC: Recent Labs  Lab 08/08/23 0421 08/09/23 0514 08/10/23 0627 08/11/23 0401 08/12/23 0624  WBC 6.4 7.5 9.6 7.5 8.5  HGB 11.6*  11.0* 11.6* 11.2* 11.2*  HCT 34.4* 33.0* 34.4* 34.1* 32.8*  MCV 85.4 87.3 86.4 88.3 85.6  PLT 215 214 248 220 225   Cardiac Enzymes: No results for input(s): "CKTOTAL", "CKMB", "CKMBINDEX", "TROPONINI" in the last 168 hours. BNP: Invalid input(s): "POCBNP" CBG: No results for input(s): "GLUCAP" in the last 168 hours. D-Dimer No results for input(s): "DDIMER" in the last 72 hours. Hgb A1c No results for input(s): "HGBA1C" in the last 72 hours. Lipid Profile No results for input(s): "CHOL", "HDL", "LDLCALC", "TRIG", "CHOLHDL", "LDLDIRECT" in the last 72 hours. Thyroid function studies No results for input(s): "TSH", "T4TOTAL", "T3FREE", "THYROIDAB" in the last 72 hours.  Invalid input(s): "FREET3" Anemia work up No results for input(s): "VITAMINB12", "FOLATE", "FERRITIN", "TIBC", "IRON", "RETICCTPCT" in the last 72 hours. Urinalysis    Component Value Date/Time   COLORURINE STRAW (A) 08/06/2023 2218   APPEARANCEUR CLEAR (A) 08/06/2023 2218   LABSPEC 1.008 08/06/2023 2218   PHURINE 7.0 08/06/2023 2218   GLUCOSEU NEGATIVE 08/06/2023 2218   HGBUR MODERATE (A) 08/06/2023 2218   BILIRUBINUR NEGATIVE 08/06/2023 2218   KETONESUR NEGATIVE 08/06/2023 2218   PROTEINUR 100 (A) 08/06/2023 2218   NITRITE NEGATIVE 08/06/2023 2218   LEUKOCYTESUR NEGATIVE 08/06/2023 2218   Sepsis Labs Recent Labs  Lab 08/09/23 0514 08/10/23 0627 08/11/23 0401 08/12/23 0624  WBC 7.5 9.6 7.5 8.5   Microbiology Recent Results (from the past 240 hour(s))  SARS Coronavirus 2 by RT PCR (hospital order, performed in [facility] hospital lab) *cepheid single result test* Anterior Nasal Swab     Status: None   Collection Time: 08/07/23  2:00 AM   Specimen: Anterior Nasal Swab  Result Value Ref Range Status   SARS Coronavirus 2 by RT PCR NEGATIVE NEGATIVE Final    Comment: (NOTE) SARS-CoV-2 target nucleic acids are NOT DETECTED.  The SARS-CoV-2 RNA is generally detectable in upper and lower respiratory  specimens during the acute phase of infection. The lowest concentration of SARS-CoV-2 viral copies this assay can detect is 250 copies / mL. A negative result does not preclude SARS-CoV-2 infection and should not be used as the sole basis for treatment or other patient management decisions.  A negative result may occur with improper specimen collection / handling, submission of specimen other than nasopharyngeal swab, presence of viral mutation(s) within the areas targeted by this assay, and inadequate number of viral copies (<250 copies / mL). A negative result must be combined with clinical observations, patient history, and epidemiological information.  Fact Sheet for Patients:   RoadLapTop.co.za  Fact Sheet for Healthcare Providers: http://kim-miller.com/  This test is not yet approved or  cleared by the [place] FDA and has been authorized for detection and/or diagnosis of SARS-CoV-2 by FDA under an Emergency Use Authorization (EUA).  This EUA will remain in effect (meaning this test can be used) for the duration of the COVID-19 declaration under Section 564(b)(1) of the Act, 21 U.S.C. section 360bbb-3(b)(1), unless the authorization is terminated or revoked sooner.  Performed at [facility], [address], [place], [place] [address]      Time coordinating discharge: Over 30 minutes  SIGNED:   Charise Killian, MD  [facility] 08/12/2023, 1:04 PM Pager   If 7PM-7AM, please contact night-coverage [email or web address]

## 2023-08-12 NOTE — Plan of Care (Signed)
  Problem: Safety: Goal: Ability to remain free from injury will improve Outcome: Progressing   Problem: Skin Integrity: Goal: Risk for impaired skin integrity will decrease Outcome: Progressing   

## 2023-08-12 NOTE — TOC Transition Note (Signed)
Transition of Care [facility]) - CM/SW Discharge Note   Patient Details  Name: Chantia Grates MRN: [id number] Date of Birth: 1922-09-17  [facility] CM/SW Contact:  Chapman Fitch, RN Phone Number: 08/12/2023, 1:42 PM   Clinical Narrative:     Patient to discharge today Barbara Cower with [facility] notified of discharge    Final next level of care: Home w Home Health Services Barriers to Discharge: Continued Medical Work up   Patient Goals and CMS Choice      Discharge Placement                         Discharge Plan and Services Additional resources added to the After Visit Summary for                  DME Arranged:  (Has RW at home)         HH Arranged:  (TBD) HH Agency: [facility] Date HH Agency Contacted: 08/08/23 Time HH Agency Contacted: 1013 Representative spoke with at [facility] Agency: Barbara Cower  Social Determinants of Health (SDOH) Interventions SDOH Screenings   Food Insecurity: No Food Insecurity (08/08/2023)  Housing: Low Risk  (08/08/2023)  Transportation Needs: No Transportation Needs (08/08/2023)  Utilities: Not At Risk (08/08/2023)  Tobacco Use: Low Risk  (08/08/2023)     Readmission Risk Interventions    03/31/2022   11:40 AM 09/17/2021    9:37 AM  Readmission Risk Prevention Plan  Transportation Screening Complete Complete  PCP or Specialist Appt within 3-5 Days Complete   HRI or Home Care Consult Complete   Social Work Consult for Recovery Care Planning/Counseling Complete   Palliative Care Screening Not Applicable   Medication Review Oceanographer) Complete Complete  PCP or Specialist appointment within 3-5 days of discharge  Complete  HRI or Home Care Consult  Complete  SW Recovery Care/Counseling Consult  Not Complete  SW Consult Not Complete Comments  RNCM assigned to case  Palliative Care Screening  Not Applicable  Comments  na  Skilled Nursing Facility  Complete

## 2023-09-17 ENCOUNTER — Emergency Department: Payer: Medicare Other

## 2023-09-17 ENCOUNTER — Other Ambulatory Visit: Payer: Self-pay

## 2023-09-17 ENCOUNTER — Emergency Department
Admission: EM | Admit: 2023-09-17 | Discharge: 2023-09-17 | Disposition: A | Discharge status: Home / Self Care | Payer: Medicare Other | Attending: Emergency Medicine | Admitting: Emergency Medicine

## 2023-09-17 DIAGNOSIS — K8020 Calculus of gallbladder without cholecystitis without obstruction: Secondary | ICD-10-CM | POA: Insufficient documentation

## 2023-09-17 DIAGNOSIS — R1032 Left lower quadrant pain: Secondary | ICD-10-CM | POA: Diagnosis not present

## 2023-09-17 DIAGNOSIS — R112 Nausea with vomiting, unspecified: Secondary | ICD-10-CM | POA: Diagnosis present

## 2023-09-17 DIAGNOSIS — R2681 Unsteadiness on feet: Secondary | ICD-10-CM | POA: Diagnosis not present

## 2023-09-17 DIAGNOSIS — N179 Acute kidney failure, unspecified: Secondary | ICD-10-CM | POA: Diagnosis not present

## 2023-09-17 DIAGNOSIS — D3501 Benign neoplasm of right adrenal gland: Secondary | ICD-10-CM | POA: Insufficient documentation

## 2023-09-17 DIAGNOSIS — R2689 Other abnormalities of gait and mobility: Secondary | ICD-10-CM | POA: Insufficient documentation

## 2023-09-17 DIAGNOSIS — I517 Cardiomegaly: Secondary | ICD-10-CM | POA: Insufficient documentation

## 2023-09-17 DIAGNOSIS — K435 Parastomal hernia without obstruction or  gangrene: Secondary | ICD-10-CM | POA: Insufficient documentation

## 2023-09-17 DIAGNOSIS — R911 Solitary pulmonary nodule: Secondary | ICD-10-CM | POA: Diagnosis not present

## 2023-09-17 LAB — COMPREHENSIVE METABOLIC PANEL
ALT: 10 U/L (ref 0–44)
AST: 16 U/L (ref 15–41)
Albumin: 3.4 g/dL — ABNORMAL LOW (ref 3.5–5.0)
Alkaline Phosphatase: 89 U/L (ref 38–126)
Anion gap: 14 (ref 5–15)
BUN: 29 mg/dL — ABNORMAL HIGH (ref 8–23)
CO2: 27 mmol/L (ref 22–32)
Calcium: 8.6 mg/dL — ABNORMAL LOW (ref 8.9–10.3)
Chloride: 95 mmol/L — ABNORMAL LOW (ref 98–111)
Creatinine, Ser: 1.66 mg/dL — ABNORMAL HIGH (ref 0.44–1.00)
GFR, Estimated: 28 mL/min — ABNORMAL LOW (ref >60)
Glucose, Bld: 97 mg/dL (ref 70–99)
Potassium: 3.9 mmol/L (ref 3.5–5.1)
Sodium: 136 mmol/L (ref 135–145)
Total Bilirubin: 0.6 mg/dL (ref 0.3–1.2)
Total Protein: 7.3 g/dL (ref 6.5–8.1)

## 2023-09-17 LAB — CBC WITH DIFFERENTIAL/PLATELET
Abs Immature Granulocytes: 0.02 10*3/uL (ref 0.00–0.07)
Basophils Absolute: 0 10*3/uL (ref 0.0–0.1)
Basophils Relative: 0 %
Eosinophils Absolute: 0 10*3/uL (ref 0.0–0.5)
Eosinophils Relative: 0 %
HCT: 34.9 % — ABNORMAL LOW (ref 36.0–46.0)
Hemoglobin: 11.5 g/dL — ABNORMAL LOW (ref 12.0–15.0)
Immature Granulocytes: 0 %
Lymphocytes Relative: 21 %
Lymphs Abs: 1.7 10*3/uL (ref 0.7–4.0)
MCH: 29.4 pg (ref 26.0–34.0)
MCHC: 33 g/dL (ref 30.0–36.0)
MCV: 89.3 fL (ref 80.0–100.0)
Monocytes Absolute: 1 10*3/uL (ref 0.1–1.0)
Monocytes Relative: 13 %
Neutro Abs: 5.1 10*3/uL (ref 1.7–7.7)
Neutrophils Relative %: 66 %
Platelets: 331 10*3/uL (ref 150–400)
RBC: 3.91 MIL/uL (ref 3.87–5.11)
RDW: 13.9 % (ref 11.5–15.5)
WBC: 7.8 10*3/uL (ref 4.0–10.5)
nRBC: 0 % (ref 0.0–0.2)

## 2023-09-17 LAB — LIPASE, BLOOD: Lipase: 26 U/L (ref 11–51)

## 2023-09-17 LAB — LACTIC ACID, PLASMA: Lactic Acid, Venous: 1 mmol/L (ref 0.5–1.9)

## 2023-09-17 MED ORDER — ONDANSETRON 4 MG PO TBDP: 4.0000 mg | ORAL_TABLET | Freq: Three times a day (TID) | ORAL | 0 refills | Status: DC | PRN

## 2023-09-17 MED ORDER — SODIUM CHLORIDE 0.9 % IV BOLUS
1000.0000 mL | Freq: Once | INTRAVENOUS | Status: AC
  Administered 2023-09-17: 1000 mL via INTRAVENOUS

## 2023-09-17 MED ORDER — ONDANSETRON HCL 4 MG/2ML IJ SOLN
4.0000 mg | Freq: Once | INTRAMUSCULAR | Status: AC
  Administered 2023-09-17: 4 mg via INTRAVENOUS
  Filled 2023-09-17: qty 2

## 2023-09-17 NOTE — ED Triage Notes (Signed)
89 yo female presents to the ED via [facility] called by daughter Vomiting green x2days, has not vomited today More lethargic, more fatigues Abdominal herina, and daughter feels it's been getting bigger Daughter reports weakness, and urine output has not been great Daugter feels Pt is dehydrated Awake and alert, no memory impariment Vitals: NSR, HR: 80, 97% RA, 149/65, CBG: 113

## 2023-09-17 NOTE — ED Notes (Signed)
Warm blanket given to Pt. 

## 2023-09-17 NOTE — ED Notes (Signed)
Pt daughter at bedside, pt resting in bed.

## 2023-09-17 NOTE — TOC CM/SW Note (Signed)
CSW acknowledges SNF consult. PT eval pending. Patient has traditional Medicare and is currently under emergency status. She will require a 3-night stay under inpatient status for insurance to pay for SNF placement. Per MD, CT results will determine if she is admitted or not. TOC will follow up with patient and family once plan determined.  Charlynn Court, CSW [phone]

## 2023-09-17 NOTE — ED Provider Notes (Signed)
[facility] Provider Note   Event Date/Time   First MD Initiated Contact with Patient 09/17/23 1223     (approximate) History  Weakness (89 yo female presents to the ED via [facility] called by daughter/Vomiting green x2days, has not vomited today/More lethargic, more fatigues/Abdominal herina, and daughter feels it's been getting bigger/Daughter reports weakness, and urine output has not been great Daugter feels Pt is dehydrated/Awake and alert, no memory impariment/Vitals: NSR, HR: 80, 97% RA, 149/65, CBG: 113)  HPI Autumn Johnston is a 89 y.o. female with a past medical history of abdominal hernia with left lower quadrant ostomy in place who presents via EMS after daughter noted patient to have emesis over the past 2 days however patient has had no emesis so far today also noted to EMS that patient's hernia "looks like it is getting bigger ".  Daughter also notes patient having decreased urine output.  Patient denies any complaints at this time ROS: Patient currently denies any vision changes, tinnitus, difficulty speaking, facial droop, sore throat, chest pain, shortness of breath, abdominal pain, nausea/vomiting/diarrhea, dysuria, or weakness/numbness/paresthesias in any extremity   Physical Exam  Triage Vital Signs: ED Triage Vitals  Encounter Vitals Group     BP      Systolic BP Percentile      Diastolic BP Percentile      Pulse      Resp      Temp      Temp src      SpO2      Weight      Height      Head Circumference      Peak Flow      Pain Score      Pain Loc      Pain Education      Exclude from Growth Chart    Most recent vital signs: Vitals:   09/17/23 1530 09/17/23 1600  BP: (!) 134/55 (!) 144/60  Pulse: 81 87  Resp:    SpO2: 100% 98%   General: Awake, oriented x4. CV:  Good peripheral perfusion.  Resp:  Normal effort.  Abd:  No distention.  Large left lower quadrant abdominal hernia intestinal contents and ostomy in place draining  brown stool Other:  Elderly cachectic Caucasian female resting comfortably in no acute distress ED Results / Procedures / Treatments  Labs (all labs ordered are listed, but only abnormal results are displayed) Labs Reviewed  COMPREHENSIVE METABOLIC PANEL - Abnormal; Notable for the following components:      Result Value   Chloride 95 (*)    BUN 29 (*)    Creatinine, Ser 1.66 (*)    Calcium 8.6 (*)    Albumin 3.4 (*)    GFR, Estimated 28 (*)    All other components within normal limits  CBC WITH DIFFERENTIAL/PLATELET - Abnormal; Notable for the following components:   Hemoglobin 11.5 (*)    HCT 34.9 (*)    All other components within normal limits  LIPASE, BLOOD  LACTIC ACID, PLASMA   RADIOLOGY ED MD interpretation: Pending -Agree with radiology assessment Official radiology report(s): DG Abd Portable 1 View  Result Date: 09/17/2023 CLINICAL DATA:  Nausea and vomiting over the last 2 days. Abdominal hernia EXAM: PORTABLE ABDOMEN - 1 VIEW COMPARISON:  08/11/2023 FINDINGS: Scattered gas in large and small bowel. Left lower quadrant bulging abdominal contour compatible with hernia containing loops of bowel including a loop of what appears to be small-bowel based on valvular  con of entities dilated up to 4.5 cm. Accordingly small-bowel obstruction in the vicinity of the hernia is not excluded. This could be better characterized with CT abdomen clinically warranted. Left hip IM nail system noted. Remote endplate compression fractures in the lumbar spine. Pelvic deformities from old fractures. Prior pelvic lymph node dissection with various vascular clips noted. IMPRESSION: 1. Left lower quadrant hernia containing loops of bowel including a loop of what appears to be small-bowel dilated up to 4.5 cm. Accordingly small-bowel obstruction in the vicinity of the hernia is not excluded. This could be better characterized with CT abdomen clinically warranted. Electronically Signed   By: Gaylyn Rong M.D.   On: 09/17/2023 14:32   PROCEDURES: Critical Care performed: No .1-3 Lead EKG Interpretation  Performed by: Merwyn Katos, MD Authorized by: Merwyn Katos, MD     Interpretation: normal     ECG rate:  71   ECG rate assessment: normal     Rhythm: sinus rhythm     Ectopy: none     Conduction: normal    MEDICATIONS ORDERED IN ED: Medications  sodium chloride 0.9 % bolus 1,000 mL (1,000 mLs Intravenous New Bag/Given 09/17/23 1442)  ondansetron (ZOFRAN) injection 4 mg (4 mg Intravenous Given 09/17/23 1442)   IMPRESSION / MDM / ASSESSMENT AND PLAN / ED COURSE  I reviewed the triage vital signs and the nursing notes.                             The patient is on the cardiac monitor to evaluate for evidence of arrhythmia and/or significant heart rate changes. Patient's presentation is most consistent with acute presentation with potential threat to life or bodily function. Given History, Exam I believe patient needs labs and imaging to evaluate for SBO vs other acute abdomen. ED Workup: CBC, BMP, LFTs, CT Abdomen/Pelvis ED Findings: CT: Pending  History, Exam, and Workup show no overt evidence of mesenteric ischemia, bowel gangrene, abscess, peritonitis. ED Interventions: Analgesia. Defer ABX at this time. Care of this patient will be signed out to the oncoming physician at the end of my shift.  All pertinent patient information conveyed and all questions answered.  All further care and disposition decisions will be made by the oncoming physician. Disposition: Pending   FINAL CLINICAL IMPRESSION(S) / ED DIAGNOSES   Final diagnoses:  Nausea and vomiting, unspecified vomiting type   Rx / DC Orders   ED Discharge Orders     None      Note:  This document was prepared using Dragon voice recognition software and may include unintentional dictation errors.   Merwyn Katos, MD 09/17/23 [phone]

## 2023-09-17 NOTE — Discharge Instructions (Addendum)
You may use the Afrin as needed for nausea.  Return to the ER for new, worsening, or persistent severe vomiting, weakness, fever, abdominal pain, or any other new or worsening symptoms that concern you.

## 2023-09-17 NOTE — ED Notes (Addendum)
Family requesting to talk with social work, ERP notified. Order placed for consult. Social Cabin crew. Family updated.

## 2023-09-17 NOTE — ED Provider Notes (Signed)
-----------------------------------------   6:20 PM on 09/17/2023 -----------------------------------------  I took over care of this patient from Dr. Vicente Males.  CT is negative for SBO.  Is consistent with gastroenteritis.  IMPRESSION:  1. Stable left lower quadrant ostomy and large parastomal hernia  containing herniated small bowel and colon without obstruction.  2. Multiple normal caliber fluid-filled small bowel loops. This can  be seen with gastroenteritis.  3. Stable cholelithiasis.  4. Stable dilated bile ducts with no visible obstructing mass or  stone.  5. Stable 12 x 7 mm irregular nodule in the right lower lobe,  unchanged since 07/28/2021 and not needing follow-up unless otherwise  clinically indicated.  6. Stable right adrenal gland adenoma with coarse, dystrophic  calcifications.    Lab workup is unremarkable.  Creatinine is slightly elevated from baseline but electrolytes are normal.  LFTs and lipase are normal.  There is no leukocytosis or anemia.  Lactate is normal.  On reassessment, the patient appears well and is currently asymptomatic.  She is able to tolerate p.o.  I counseled the patient and her daughter on the results of the workup.  I did consider whether the patient may benefit from inpatient admission given her age as well as the mild AKI.  I offered inpatient admission to them, however the daughter states that she would prefer to go home.  I think that this  is reasonable given the reassuring workup and the fact that the patient is now asymptomatic and tolerating p.o.  I gave them strict return precautions and they expressed understanding.   Dionne Bucy, MD 09/17/23 Rickey Primus

## 2023-09-17 NOTE — Evaluation (Signed)
Physical Therapy Evaluation Patient Details Name: Autumn Johnston MRN: [id number] DOB: 17-May-1922 Today's Date: 09/17/2023  History of Present Illness  Pt is a 89 yo F diagnosed with nausea and vomiting.  PMH includes: colon cancer s/p colostomy, hypertension, hyperlipidemia, CKD lllb, and anemia of chronic disease.  Clinical Impression  Pt was pleasant and motivated to participate during the session and put forth good effort throughout. Pt found supine in bed with family (daughter) in room. Pt is able to perform supine to sit with supervision assist, and sit to supine with min A due to high bed height. She is able to perform STS from high bed surface at supervision. Once up, pt able to amb ~25 feet with steady steps. No imbalance or SOB noted, pt and daughter reports this is typically the distance she can walk in her home. Pt will benefit from continued PT services upon discharge to safely address deficits listed in patient problem list for decreased caregiver assistance and eventual return to PLOF. Note* Pt has HHPT from prior admission, continue with HHPT upon discharge.        If plan is discharge home, recommend the following: A little help with walking and/or transfers;A little help with bathing/dressing/bathroom;Assistance with cooking/housework;Help with stairs or ramp for entrance;Assist for transportation   Can travel by private vehicle        Equipment Recommendations None recommended by PT  Recommendations for Other Services       Functional Status Assessment Patient has not had a recent decline in their functional status     Precautions / Restrictions Precautions Precautions: Fall Restrictions Weight Bearing Restrictions: No      Mobility  Bed Mobility Overal bed mobility: Needs Assistance Bed Mobility: Supine to Sit, Sit to Supine,     Supine to sit: Supervision Sit to supine: +2 for physical assistance, Min assist   General bed mobility comments: slow but  no physical assist req    Transfers Overall transfer level: Needs assistance Equipment used: Rolling walker (2 wheels) Transfers: Sit to/from Stand Sit to Stand: Supervision           General transfer comment: Slow STS from high ED bed height, no physical assist    Ambulation/Gait Ambulation/Gait assistance: Supervision Gait Distance (Feet): 25 Feet Assistive device: Rolling walker (2 wheels) Gait Pattern/deviations: Step-through pattern Gait velocity: decreased     General Gait Details: Steady steps with RW, no imbalance or SOB noted  Stairs            Wheelchair Mobility     Tilt Bed    Modified Rankin (Stroke Patients Only)       Balance Overall balance assessment: No apparent balance deficits (not formally assessed)                                           Pertinent Vitals/Pain Pain Assessment Pain Assessment: No/denies pain    Home Living Family/patient expects to be discharged to:: Private residence Living Arrangements: Alone Available Help at Discharge: Family;Available 24 hours/day Type of Home: House Home Access: Level entry       Home Layout: Able to live on main level with bedroom/bathroom Home Equipment: Rolling Walker (2 wheels) Additional Comments: Children alternate weeks staying with her    Prior Function Prior Level of Function : Needs assist  Mobility Comments: limited household ambulator, children there with supervision ADLs Comments: children assist with IADL's     Extremity/Trunk Assessment   Upper Extremity Assessment Upper Extremity Assessment: Overall WFL for tasks assessed    Lower Extremity Assessment Lower Extremity Assessment: Overall WFL for tasks assessed       Communication   Communication Communication: Hearing impairment  Cognition Arousal: Alert Behavior During Therapy: WFL for tasks assessed/performed Overall Cognitive Status: Within Functional Limits for tasks  assessed                                          General Comments      Exercises     Assessment/Plan    PT Assessment Patient does not need any further PT services  PT Problem List         PT Treatment Interventions      PT Goals (Current goals can be found in the Care Plan section)  Acute Rehab PT Goals Patient Stated Goal: get home PT Goal Formulation: With patient Time For Goal Achievement: 09/30/23 Potential to Achieve Goals: Good    Frequency       Co-evaluation               AM-PAC PT "6 Clicks" Mobility  Outcome Measure Help needed turning from your back to your side while in a flat bed without using bedrails?: None Help needed moving from lying on your back to sitting on the side of a flat bed without using bedrails?: None Help needed moving to and from a bed to a chair (including a wheelchair)?: None Help needed standing up from a chair using your arms (e.g., wheelchair or bedside chair)?: None Help needed to walk in hospital room?: None Help needed climbing 3-5 steps with a railing? : A Little 6 Click Score: 23    End of Session Equipment Utilized During Treatment: Gait belt Activity Tolerance: Patient tolerated treatment well Patient left: in bed;with bed alarm set;with call bell/phone within reach Nurse Communication: Mobility status PT Visit Diagnosis: Unsteadiness on feet (R26.81);Other abnormalities of gait and mobility (R26.89)    Time: 2440-1027 PT Time Calculation (min) (ACUTE ONLY): 25 min   Charges:                 Cecile Sheerer, SPT 09/17/23, 5:29 PM

## 2023-09-30 ENCOUNTER — Emergency Department: Payer: Medicare Other

## 2023-09-30 ENCOUNTER — Inpatient Hospital Stay
Admission: EM | Admit: 2023-09-30 | Discharge: 2023-10-04 | DRG: 091 | Disposition: A | Discharge status: Skilled Nursing Facility | Payer: Medicare Other | Attending: Internal Medicine | Admitting: Internal Medicine

## 2023-09-30 ENCOUNTER — Other Ambulatory Visit: Payer: Self-pay

## 2023-09-30 DIAGNOSIS — Z933 Colostomy status: Secondary | ICD-10-CM

## 2023-09-30 DIAGNOSIS — Z85038 Personal history of other malignant neoplasm of large intestine: Secondary | ICD-10-CM

## 2023-09-30 DIAGNOSIS — N184 Chronic kidney disease, stage 4 (severe): Secondary | ICD-10-CM | POA: Diagnosis present

## 2023-09-30 DIAGNOSIS — K435 Parastomal hernia without obstruction or  gangrene: Secondary | ICD-10-CM | POA: Diagnosis present

## 2023-09-30 DIAGNOSIS — Z79899 Other long term (current) drug therapy: Secondary | ICD-10-CM

## 2023-09-30 DIAGNOSIS — R296 Repeated falls: Secondary | ICD-10-CM | POA: Diagnosis not present

## 2023-09-30 DIAGNOSIS — I160 Hypertensive urgency: Secondary | ICD-10-CM | POA: Diagnosis present

## 2023-09-30 DIAGNOSIS — F0393 Unspecified dementia, unspecified severity, with mood disturbance: Secondary | ICD-10-CM | POA: Diagnosis present

## 2023-09-30 DIAGNOSIS — M8448XA Pathological fracture, other site, initial encounter for fracture: Secondary | ICD-10-CM | POA: Diagnosis present

## 2023-09-30 DIAGNOSIS — R54 Age-related physical debility: Secondary | ICD-10-CM | POA: Diagnosis present

## 2023-09-30 DIAGNOSIS — Z9181 History of falling: Secondary | ICD-10-CM

## 2023-09-30 DIAGNOSIS — N179 Acute kidney failure, unspecified: Secondary | ICD-10-CM | POA: Diagnosis not present

## 2023-09-30 DIAGNOSIS — E785 Hyperlipidemia, unspecified: Secondary | ICD-10-CM | POA: Diagnosis present

## 2023-09-30 DIAGNOSIS — I129 Hypertensive chronic kidney disease with stage 1 through stage 4 chronic kidney disease, or unspecified chronic kidney disease: Secondary | ICD-10-CM | POA: Diagnosis present

## 2023-09-30 DIAGNOSIS — Y92009 Unspecified place in unspecified non-institutional (private) residence as the place of occurrence of the external cause: Secondary | ICD-10-CM

## 2023-09-30 DIAGNOSIS — E43 Unspecified severe protein-calorie malnutrition: Secondary | ICD-10-CM | POA: Diagnosis present

## 2023-09-30 DIAGNOSIS — N1330 Unspecified hydronephrosis: Secondary | ICD-10-CM | POA: Diagnosis present

## 2023-09-30 DIAGNOSIS — F0390 Unspecified dementia without behavioral disturbance: Secondary | ICD-10-CM | POA: Insufficient documentation

## 2023-09-30 DIAGNOSIS — R262 Difficulty in walking, not elsewhere classified: Secondary | ICD-10-CM

## 2023-09-30 DIAGNOSIS — Y92003 Bedroom of unspecified non-institutional (private) residence as the place of occurrence of the external cause: Secondary | ICD-10-CM

## 2023-09-30 DIAGNOSIS — Z66 Do not resuscitate: Secondary | ICD-10-CM | POA: Diagnosis present

## 2023-09-30 DIAGNOSIS — W19XXXA Unspecified fall, initial encounter: Secondary | ICD-10-CM

## 2023-09-30 DIAGNOSIS — Z681 Body mass index (BMI) 19 or less, adult: Secondary | ICD-10-CM

## 2023-09-30 DIAGNOSIS — W06XXXA Fall from bed, initial encounter: Secondary | ICD-10-CM | POA: Diagnosis present

## 2023-09-30 DIAGNOSIS — F32A Depression, unspecified: Secondary | ICD-10-CM | POA: Diagnosis present

## 2023-09-30 DIAGNOSIS — E46 Unspecified protein-calorie malnutrition: Secondary | ICD-10-CM | POA: Diagnosis present

## 2023-09-30 DIAGNOSIS — D631 Anemia in chronic kidney disease: Secondary | ICD-10-CM | POA: Diagnosis present

## 2023-09-30 DIAGNOSIS — Z748 Other problems related to care provider dependency: Secondary | ICD-10-CM | POA: Insufficient documentation

## 2023-09-30 DIAGNOSIS — Z1152 Encounter for screening for COVID-19: Secondary | ICD-10-CM

## 2023-09-30 LAB — URINALYSIS, ROUTINE W REFLEX MICROSCOPIC
Bilirubin Urine: NEGATIVE
Glucose, UA: NEGATIVE mg/dL
Hgb urine dipstick: NEGATIVE
Ketones, ur: NEGATIVE mg/dL
Leukocytes,Ua: NEGATIVE
Nitrite: NEGATIVE
Protein, ur: 30 mg/dL — AB
Specific Gravity, Urine: 1.012 (ref 1.005–1.030)
pH: 5 (ref 5.0–8.0)

## 2023-09-30 NOTE — ED Provider Notes (Signed)
[facility] Provider Note    Event Date/Time   First MD Initiated Contact with Patient 09/30/23 1639     (approximate)   History   Fall   HPI  Saniyah Mondesir is a 89 y.o. female  with PMH of hypertension, CKD, depression and dementia presents for evaluation after a fall earlier today.  Patient's daughter states that she fell trying to get out of bed.  It was an unwitnessed fall.  Patient denies any pain to her head.  She reports pain to her left hip and left thigh.  She did previously have a hip surgery.  Prior to her fall patient was ambulatory with a walker, since her fall she has not been able to walk.  Patient's daughter is concerned that she may also have a UTI as she has been more confused than usual.     Physical Exam   Triage Vital Signs: ED Triage Vitals  Encounter Vitals Group     BP 09/30/23 1444 (!) 188/83     Systolic BP Percentile --      Diastolic BP Percentile --      Pulse Rate 09/30/23 1444 75     Resp 09/30/23 1444 17     Temp 09/30/23 1444 98.8 F (37.1 C)     Temp Source 09/30/23 1444 Oral     SpO2 09/30/23 1444 98 %     Weight 09/30/23 1445 97 lb 10.6 oz (44.3 kg)     Height 09/30/23 1445 5\' 2"  (1.575 m)     Head Circumference --      Peak Flow --      Pain Score 09/30/23 1445 0     Pain Loc --      Pain Education --      Exclude from Growth Chart --     Most recent vital signs: Vitals:   09/30/23 1444 09/30/23 1954  BP: (!) 188/83 (!) 177/82  Pulse: 75 73  Resp: 17 16  Temp: 98.8 F (37.1 C) 98.1 F (36.7 C)  SpO2: 98% 97%   General: Awake, no distress.  CV:  Good peripheral perfusion.  RRR. Resp:  Normal effort.  CTAB. Abd:  No distention.  Other:  Tender to palpation over the left hip and anterior thigh, unable to perform range of motion due to pain.  No focal neurodeficits.   ED Results / Procedures / Treatments   Labs (all labs ordered are listed, but only abnormal results are displayed) Labs  Reviewed  URINALYSIS, ROUTINE W REFLEX MICROSCOPIC - Abnormal; Notable for the following components:      Result Value   Color, Urine YELLOW (*)    APPearance CLEAR (*)    Protein, ur 30 (*)    Bacteria, UA RARE (*)    All other components within normal limits  COMPREHENSIVE METABOLIC PANEL  CBC WITH DIFFERENTIAL/PLATELET    RADIOLOGY  Left hip x-ray and CT scan obtained, interpreted the images as well as reviewed the radiologist report.  Hip x-ray did not show any acute fractures, her hardware from previous surgery remains intact and placed.  CT of the hip confirms that she does not have a fracture.   PROCEDURES:  Critical Care performed: No  Procedures   MEDICATIONS ORDERED IN ED: Medications - No data to display   IMPRESSION / MDM / ASSESSMENT AND PLAN / ED COURSE  I reviewed the triage vital signs and the nursing notes.  89 year old female presents for evaluation after a fall and has been having more frequent falls recently.  She was hypertensive in triage otherwise vital signs stable.  Patient NAD on exam.  Differential diagnosis includes, but is not limited to, hip fracture, hip dislocation, intracranial bleed, concussion, arrhythmia, dehydration, UTI.  Patient's presentation is most consistent with acute complicated illness / injury requiring diagnostic workup.  Left hip x-ray and left hip CT obtained, interpreted the images as well as reviewed the radiologist report which did not show any evidence of a hip fracture.  Her hardware from previous surgery remains intact and in place.  Urinalysis does not show any evidence of infection but is notable for some protein.  CBC and CMP are pending.  CT of the head is pending. ED ECG is pending.   Patient was offered pain medication multiple times and she declined.   Although patient does not have a hip fracture, she will need further work up to evaluate for why she is having increased frequency of  falls. Hospitalist will admit for further work up.   Care of the patient will be passed off to Dr. Modesto Charon who will help facilitate patient's admission.      FINAL CLINICAL IMPRESSION(S) / ED DIAGNOSES   Final diagnoses:  Frequent falls     Rx / DC Orders   ED Discharge Orders     None        Note:  This document was prepared using Dragon voice recognition software and may include unintentional dictation errors.   Cameron Ali, PA-C 10/01/23 Marlyce Huge    Dionne Bucy, MD 10/01/23 [phone]

## 2023-09-30 NOTE — ED Triage Notes (Signed)
See first nurse note. 

## 2023-09-30 NOTE — ED Provider Notes (Incomplete)
[facility] Provider Note    Event Date/Time   First MD Initiated Contact with Patient 09/30/23 1639     (approximate)   History   Fall   HPI  Autumn Johnston is a 89 y.o. female  with PMH of hypertension, CKD, depression and dementia presents for evaluation after a fall earlier today.  Patient's daughter states that she fell trying to get out of bed.  Patient did not hit her head, no LOC.  She reports pain to her left hip and left thigh.  She did previously have a hip surgery.  Prior to her fall patient was ambulatory with a walker, since her fall she has not been able to walk.  Patient's daughter is concerned that she may also have a UTI as she has been more confused than usual.     Physical Exam   Triage Vital Signs: ED Triage Vitals  Encounter Vitals Group     BP 09/30/23 1444 (!) 188/83     Systolic BP Percentile --      Diastolic BP Percentile --      Pulse Rate 09/30/23 1444 75     Resp 09/30/23 1444 17     Temp 09/30/23 1444 98.8 F (37.1 C)     Temp Source 09/30/23 1444 Oral     SpO2 09/30/23 1444 98 %     Weight 09/30/23 1445 97 lb 10.6 oz (44.3 kg)     Height 09/30/23 1445 5\' 2"  (1.575 m)     Head Circumference --      Peak Flow --      Pain Score 09/30/23 1445 0     Pain Loc --      Pain Education --      Exclude from Growth Chart --     Most recent vital signs: Vitals:   09/30/23 1444 09/30/23 1954  BP: (!) 188/83 (!) 177/82  Pulse: 75 73  Resp: 17 16  Temp: 98.8 F (37.1 C) 98.1 F (36.7 C)  SpO2: 98% 97%   General: Awake, no distress.  CV:  Good peripheral perfusion.  RRR. Resp:  Normal effort.  CTAB. Abd:  No distention.  Other:  Tender to palpation over the left hip and anterior thigh, unable to perform range of motion due to pain.   ED Results / Procedures / Treatments   Labs (all labs ordered are listed, but only abnormal results are displayed) Labs Reviewed  URINALYSIS, ROUTINE W REFLEX MICROSCOPIC -  Abnormal; Notable for the following components:      Result Value   Color, Urine YELLOW (*)    APPearance CLEAR (*)    Protein, ur 30 (*)    Bacteria, UA RARE (*)    All other components within normal limits    RADIOLOGY  Left hip x-ray and CT scan obtained, interpreted the images as well as reviewed the radiologist report.  Hip x-ray did not show any acute fractures, her hardware from previous surgery remains intact and placed.  CT of the hip confirms that she does not have a fracture.   PROCEDURES:  Critical Care performed: No  Procedures   MEDICATIONS ORDERED IN ED: Medications - No data to display   IMPRESSION / MDM / ASSESSMENT AND PLAN / ED COURSE  I reviewed the triage vital signs and the nursing notes.  Differential diagnosis includes, but is not limited to, hip fracture, hip dislocation   Patient's presentation is most consistent with {EM COPA:27473}  *** {If the patient is on the monitor, remove the brackets and asterisks on the sentence below and remember to document it as a Procedure as well. Otherwise delete the sentence below:1} {**The patient is on the cardiac monitor to evaluate for evidence of arrhythmia and/or significant heart rate changes.**} {Remember to include, when applicable, any/all of the following data: independent review of imaging independent review of labs (comment specifically on pertinent positives and negatives) review of specific prior hospitalizations, PCP/specialist notes, etc. discuss meds given and prescribed document any discussion with consultants (including hospitalists) any clinical decision tools you used and why (PECARN, NEXUS, etc.) did you consider admitting the patient? document social determinants of health affecting patient's care (homelessness, inability to follow up in a timely fashion, etc) document any pre-existing conditions increasing risk on current visit (e.g. diabetes and HTN  increasing danger of high-risk chest pain/ACS) describes what meds you gave (especially parenteral) and why any other interventions?:1}     FINAL CLINICAL IMPRESSION(S) / ED DIAGNOSES   Final diagnoses:  None     Rx / DC Orders   ED Discharge Orders     None        Note:  This document was prepared using Dragon voice recognition software and may include unintentional dictation errors.

## 2023-09-30 NOTE — ED Triage Notes (Addendum)
First nurse note: pt to ED [facility] from home for unwitnessed fall last night, found at 0200 in floor. Denies blood thinners. C/o left upper leg pain. Ems reports confusion at baseline.

## 2023-10-01 ENCOUNTER — Emergency Department: Payer: Medicare Other

## 2023-10-01 ENCOUNTER — Encounter: Payer: Self-pay | Admitting: Internal Medicine

## 2023-10-01 DIAGNOSIS — Y92009 Unspecified place in unspecified non-institutional (private) residence as the place of occurrence of the external cause: Secondary | ICD-10-CM | POA: Diagnosis not present

## 2023-10-01 DIAGNOSIS — N179 Acute kidney failure, unspecified: Secondary | ICD-10-CM | POA: Diagnosis present

## 2023-10-01 DIAGNOSIS — Z66 Do not resuscitate: Secondary | ICD-10-CM | POA: Diagnosis present

## 2023-10-01 DIAGNOSIS — R296 Repeated falls: Secondary | ICD-10-CM | POA: Diagnosis present

## 2023-10-01 DIAGNOSIS — K435 Parastomal hernia without obstruction or  gangrene: Secondary | ICD-10-CM | POA: Diagnosis present

## 2023-10-01 DIAGNOSIS — N184 Chronic kidney disease, stage 4 (severe): Secondary | ICD-10-CM | POA: Diagnosis present

## 2023-10-01 DIAGNOSIS — S3210XA Unspecified fracture of sacrum, initial encounter for closed fracture: Secondary | ICD-10-CM

## 2023-10-01 DIAGNOSIS — Z748 Other problems related to care provider dependency: Secondary | ICD-10-CM | POA: Diagnosis not present

## 2023-10-01 DIAGNOSIS — S3210XD Unspecified fracture of sacrum, subsequent encounter for fracture with routine healing: Secondary | ICD-10-CM | POA: Diagnosis not present

## 2023-10-01 DIAGNOSIS — M8448XD Pathological fracture, other site, subsequent encounter for fracture with routine healing: Secondary | ICD-10-CM

## 2023-10-01 DIAGNOSIS — W19XXXA Unspecified fall, initial encounter: Secondary | ICD-10-CM | POA: Diagnosis not present

## 2023-10-01 DIAGNOSIS — D631 Anemia in chronic kidney disease: Secondary | ICD-10-CM | POA: Diagnosis present

## 2023-10-01 DIAGNOSIS — M8448XA Pathological fracture, other site, initial encounter for fracture: Secondary | ICD-10-CM

## 2023-10-01 DIAGNOSIS — W06XXXA Fall from bed, initial encounter: Secondary | ICD-10-CM | POA: Diagnosis present

## 2023-10-01 DIAGNOSIS — E785 Hyperlipidemia, unspecified: Secondary | ICD-10-CM | POA: Diagnosis present

## 2023-10-01 DIAGNOSIS — I160 Hypertensive urgency: Secondary | ICD-10-CM | POA: Diagnosis present

## 2023-10-01 DIAGNOSIS — Z933 Colostomy status: Secondary | ICD-10-CM | POA: Diagnosis not present

## 2023-10-01 DIAGNOSIS — Z681 Body mass index (BMI) 19 or less, adult: Secondary | ICD-10-CM | POA: Diagnosis not present

## 2023-10-01 DIAGNOSIS — Y92003 Bedroom of unspecified non-institutional (private) residence as the place of occurrence of the external cause: Secondary | ICD-10-CM | POA: Diagnosis not present

## 2023-10-01 DIAGNOSIS — Z1152 Encounter for screening for COVID-19: Secondary | ICD-10-CM | POA: Diagnosis not present

## 2023-10-01 DIAGNOSIS — R262 Difficulty in walking, not elsewhere classified: Secondary | ICD-10-CM

## 2023-10-01 DIAGNOSIS — Z9181 History of falling: Secondary | ICD-10-CM | POA: Diagnosis not present

## 2023-10-01 DIAGNOSIS — E43 Unspecified severe protein-calorie malnutrition: Secondary | ICD-10-CM | POA: Diagnosis present

## 2023-10-01 DIAGNOSIS — Z79899 Other long term (current) drug therapy: Secondary | ICD-10-CM | POA: Diagnosis not present

## 2023-10-01 DIAGNOSIS — F32A Depression, unspecified: Secondary | ICD-10-CM | POA: Diagnosis present

## 2023-10-01 DIAGNOSIS — F0393 Unspecified dementia, unspecified severity, with mood disturbance: Secondary | ICD-10-CM | POA: Diagnosis present

## 2023-10-01 DIAGNOSIS — I129 Hypertensive chronic kidney disease with stage 1 through stage 4 chronic kidney disease, or unspecified chronic kidney disease: Secondary | ICD-10-CM | POA: Diagnosis present

## 2023-10-01 DIAGNOSIS — Z515 Encounter for palliative care: Secondary | ICD-10-CM

## 2023-10-01 DIAGNOSIS — F0390 Unspecified dementia without behavioral disturbance: Secondary | ICD-10-CM | POA: Insufficient documentation

## 2023-10-01 DIAGNOSIS — Z85038 Personal history of other malignant neoplasm of large intestine: Secondary | ICD-10-CM | POA: Diagnosis not present

## 2023-10-01 LAB — CBC
HCT: 34.4 % — ABNORMAL LOW (ref 36.0–46.0)
Hemoglobin: 11 g/dL — ABNORMAL LOW (ref 12.0–15.0)
MCH: 28.7 pg (ref 26.0–34.0)
MCHC: 32 g/dL (ref 30.0–36.0)
MCV: 89.8 fL (ref 80.0–100.0)
Platelets: 281 10*3/uL (ref 150–400)
RBC: 3.83 MIL/uL — ABNORMAL LOW (ref 3.87–5.11)
RDW: 14 % (ref 11.5–15.5)
WBC: 7.1 10*3/uL (ref 4.0–10.5)
nRBC: 0 % (ref 0.0–0.2)

## 2023-10-01 LAB — BASIC METABOLIC PANEL
Anion gap: 12 (ref 5–15)
BUN: 16 mg/dL (ref 8–23)
CO2: 25 mmol/L (ref 22–32)
Calcium: 8.6 mg/dL — ABNORMAL LOW (ref 8.9–10.3)
Chloride: 99 mmol/L (ref 98–111)
Creatinine, Ser: 1.26 mg/dL — ABNORMAL HIGH (ref 0.44–1.00)
GFR, Estimated: 39 mL/min — ABNORMAL LOW (ref >60)
Glucose, Bld: 90 mg/dL (ref 70–99)
Potassium: 3.5 mmol/L (ref 3.5–5.1)
Sodium: 136 mmol/L (ref 135–145)

## 2023-10-01 LAB — COMPREHENSIVE METABOLIC PANEL
ALT: 14 U/L (ref 0–44)
AST: 22 U/L (ref 15–41)
Albumin: 3.7 g/dL (ref 3.5–5.0)
Alkaline Phosphatase: 67 U/L (ref 38–126)
Anion gap: 15 (ref 5–15)
BUN: 17 mg/dL (ref 8–23)
CO2: 24 mmol/L (ref 22–32)
Calcium: 8.8 mg/dL — ABNORMAL LOW (ref 8.9–10.3)
Chloride: 98 mmol/L (ref 98–111)
Creatinine, Ser: 1.3 mg/dL — ABNORMAL HIGH (ref 0.44–1.00)
GFR, Estimated: 37 mL/min — ABNORMAL LOW (ref >60)
Glucose, Bld: 98 mg/dL (ref 70–99)
Potassium: 3.5 mmol/L (ref 3.5–5.1)
Sodium: 137 mmol/L (ref 135–145)
Total Bilirubin: 1.1 mg/dL (ref 0.3–1.2)
Total Protein: 6.9 g/dL (ref 6.5–8.1)

## 2023-10-01 LAB — CBC WITH DIFFERENTIAL/PLATELET
Abs Immature Granulocytes: 0.03 10*3/uL (ref 0.00–0.07)
Basophils Absolute: 0 10*3/uL (ref 0.0–0.1)
Basophils Relative: 1 %
Eosinophils Absolute: 0 10*3/uL (ref 0.0–0.5)
Eosinophils Relative: 0 %
HCT: 35 % — ABNORMAL LOW (ref 36.0–46.0)
Hemoglobin: 11 g/dL — ABNORMAL LOW (ref 12.0–15.0)
Immature Granulocytes: 0 %
Lymphocytes Relative: 19 %
Lymphs Abs: 1.5 10*3/uL (ref 0.7–4.0)
MCH: 28.4 pg (ref 26.0–34.0)
MCHC: 31.4 g/dL (ref 30.0–36.0)
MCV: 90.4 fL (ref 80.0–100.0)
Monocytes Absolute: 0.7 10*3/uL (ref 0.1–1.0)
Monocytes Relative: 10 %
Neutro Abs: 5.4 10*3/uL (ref 1.7–7.7)
Neutrophils Relative %: 70 %
Platelets: 278 10*3/uL (ref 150–400)
RBC: 3.87 MIL/uL (ref 3.87–5.11)
RDW: 14.1 % (ref 11.5–15.5)
WBC: 7.7 10*3/uL (ref 4.0–10.5)
nRBC: 0 % (ref 0.0–0.2)

## 2023-10-01 MED ORDER — ONDANSETRON HCL 4 MG/2ML IJ SOLN: 4.0000 mg | Freq: Four times a day (QID) | INTRAMUSCULAR | Status: DC | PRN

## 2023-10-01 MED ORDER — POLYETHYLENE GLYCOL 3350 17 G PO PACK: 17.0000 g | PACK | Freq: Every day | ORAL | Status: DC | PRN

## 2023-10-01 MED ORDER — FESOTERODINE FUMARATE ER 4 MG PO TB24
4.0000 mg | ORAL_TABLET | Freq: Every day | ORAL | Status: DC
  Administered 2023-10-01 – 2023-10-02 (×2): 4 mg via ORAL
  Filled 2023-10-01 (×4): qty 1

## 2023-10-01 MED ORDER — SIMVASTATIN 20 MG PO TABS
20.0000 mg | ORAL_TABLET | Freq: Every day | ORAL | Status: DC
  Administered 2023-10-01 – 2023-10-03 (×4): 20 mg via ORAL
  Filled 2023-10-01 (×2): qty 1
  Filled 2023-10-01: qty 2
  Filled 2023-10-01: qty 1

## 2023-10-01 MED ORDER — MORPHINE SULFATE (PF) 2 MG/ML IV SOLN
2.0000 mg | INTRAVENOUS | Status: DC | PRN
  Filled 2023-10-01: qty 1

## 2023-10-01 MED ORDER — ACETAMINOPHEN 325 MG PO TABS: 650.0000 mg | ORAL_TABLET | Freq: Four times a day (QID) | ORAL | Status: DC | PRN

## 2023-10-01 MED ORDER — ISOSORBIDE MONONITRATE ER 30 MG PO TB24
15.0000 mg | ORAL_TABLET | Freq: Every day | ORAL | Status: DC
  Administered 2023-10-01 – 2023-10-02 (×2): 15 mg via ORAL
  Filled 2023-10-01 (×3): qty 1

## 2023-10-01 MED ORDER — ADULT MULTIVITAMIN W/MINERALS CH
1.0000 | ORAL_TABLET | Freq: Every day | ORAL | Status: DC
  Administered 2023-10-02: 1 via ORAL
  Filled 2023-10-01 (×2): qty 1

## 2023-10-01 MED ORDER — AMLODIPINE BESYLATE 10 MG PO TABS
10.0000 mg | ORAL_TABLET | Freq: Every day | ORAL | Status: DC
  Administered 2023-10-01 – 2023-10-02 (×2): 10 mg via ORAL
  Filled 2023-10-01 (×3): qty 1

## 2023-10-01 MED ORDER — ONDANSETRON HCL 4 MG PO TABS: 4.0000 mg | ORAL_TABLET | Freq: Four times a day (QID) | ORAL | Status: DC | PRN

## 2023-10-01 MED ORDER — ENSURE ENLIVE PO LIQD
237.0000 mL | Freq: Two times a day (BID) | ORAL | Status: DC
  Administered 2023-10-02 – 2023-10-04 (×2): 237 mL via ORAL

## 2023-10-01 MED ORDER — ENOXAPARIN SODIUM 30 MG/0.3ML IJ SOSY
30.0000 mg | PREFILLED_SYRINGE | INTRAMUSCULAR | Status: DC
  Administered 2023-10-01 – 2023-10-02 (×2): 30 mg via SUBCUTANEOUS
  Filled 2023-10-01 (×3): qty 0.3

## 2023-10-01 MED ORDER — HYDROCODONE-ACETAMINOPHEN 5-325 MG PO TABS
1.0000 | ORAL_TABLET | ORAL | Status: DC | PRN
  Administered 2023-10-02 – 2023-10-04 (×3): 1 via ORAL
  Administered 2023-10-04 (×2): 2 via ORAL
  Filled 2023-10-01: qty 1
  Filled 2023-10-01: qty 2
  Filled 2023-10-01 (×2): qty 1
  Filled 2023-10-01: qty 2

## 2023-10-01 MED ORDER — ALPRAZOLAM 0.25 MG PO TABS
0.2500 mg | ORAL_TABLET | Freq: Two times a day (BID) | ORAL | Status: DC | PRN
  Administered 2023-10-01 – 2023-10-04 (×8): 0.25 mg via ORAL
  Filled 2023-10-01 (×8): qty 1

## 2023-10-01 MED ORDER — ACETAMINOPHEN 650 MG RE SUPP: 650.0000 mg | Freq: Four times a day (QID) | RECTAL | Status: DC | PRN

## 2023-10-01 NOTE — Progress Notes (Signed)
Anticoagulation monitoring(Lovenox):  89 yo female ordered Lovenox 40 mg Q24h    Filed Weights   09/30/23 1445  Weight: 44.3 kg (97 lb 10.6 oz)   BMI 17.9   Lab Results  Component Value Date   CREATININE 1.30 (H) 10/01/2023   CREATININE 1.66 (H) 09/17/2023   CREATININE 1.05 (H) 08/12/2023   Estimated Creatinine Clearance: 16.9 mL/min (A) (by C-G formula based on SCr of 1.3 mg/dL (H)). Hemoglobin & Hematocrit     Component Value Date/Time   HGB 11.0 (L) 10/01/2023 0028   HGB 12.7 09/19/2012 0942   HCT 35.0 (L) 10/01/2023 0028   HCT 37.5 09/19/2012 0942     Per Protocol for Patient with estCrcl < 30 ml/min and BMI < 30, will transition to Lovenox 30 mg Q24h.

## 2023-10-01 NOTE — Progress Notes (Signed)
Patient very confused, poor safety awareness and poor impulse control requires very extensive staff assist to keep her redirected.

## 2023-10-01 NOTE — H&P (Addendum)
History and Physical    Patient: Autumn Johnston WJX:[id number] DOB: 05-06-1922 DOA: 09/30/2023 DOS: the patient was seen and examined on 10/01/2023 PCP: Marguarite Arbour, MD  Patient coming from: Home  Chief Complaint:  Chief Complaint  Patient presents with   Fall    HPI: Autumn Johnston is a 89 y.o. 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 The history is taken from her daughter who said that since her discharge on 08/12/23 she has fallen at least five times when trying to get out of bed. Says she does not sleep and tries to get out the bed. On previous occasions she has been helped up and able to ambulate but this time she was unable to.  Patient lives alone since her husband died in 04/08/2023 and three children take turns spending the day and night with her. She also says that she is declining mentally and saying things that don't make sense. It is getting increasingly difficult to care for her mother at home as all children are in their seventies. ED course and data review: BP elevated at 188/83 with otherwise normal vitals EKG NSR at 87 with nonspecific ST-T wave changes Labs: CBC and BMP done and notable for creatinine 1.3 up from baseline of 0.88 Head CT nonacute CT hip shows subacute sacral insufficiency fracture and other older findings as follows:  IMPRESSION: 1. Status post intramedullary rodding of the left femur for old intertrochanteric  fracture. No definite acute osseous abnormality 2. Old left inferior pubic ramus fracture 3. Heterogeneous progressive sclerosis in the left sacrum consistent with healing/subacute sacral insufficiency fracture   Hospitalist consulted for admission    Past Medical History:  Diagnosis Date   CKD (chronic kidney disease) stage 4, GFR 15-29 ml/min (HCC)    Colon cancer (HCC)    s/p colostomy   Dementia (HCC)    Depression    Hypertension    Parastomal hernia    Past Surgical History:  Procedure Laterality Date   COLECTOMY WITH COLOSTOMY CREATION/HARTMANN PROCEDURE     INTRAMEDULLARY (IM) NAIL INTERTROCHANTERIC Left 09/08/2021   Procedure: INTRAMEDULLARY (IM) NAIL INTERTROCHANTRIC;  Surgeon: Signa Kell, MD;  Location: [facility] ORS;  Service: Orthopedics;  Laterality: Left;   Social History:  reports that she has never smoked. She has never used smokeless tobacco. She reports that she does not drink alcohol and does not use drugs.  No Known Allergies  No family history on file.  Prior to Admission medications   Medication Sig Start Date End Date Taking? Authorizing Provider  ALPRAZolam (XANAX) 0.25 MG tablet Take 1 tablet (0.25 mg total) by mouth 2 (two) times daily as needed. 08/01/21   Marrion Coy, MD  amLODipine (NORVASC) 10 MG tablet Take 1 tablet (10 mg total) by mouth daily. 08/13/23 09/12/23  Charise Killian, MD  isosorbide mononitrate (IMDUR) 30 MG 24 hr tablet Take 0.5 tablets (15 mg total) by mouth daily. 08/13/23 09/12/23  Charise Killian, MD  ondansetron (ZOFRAN-ODT) 4 MG disintegrating tablet Take 1 tablet (4 mg total)  by mouth every 8 (eight) hours as needed for nausea or vomiting. 09/17/23   Dionne Bucy, MD  polyethylene glycol (MIRALAX / GLYCOLAX) 17 g packet Take 17 g by mouth daily as needed for mild constipation. 04/02/22   Enedina Finner, MD  simvastatin (ZOCOR) 20 MG tablet Take 20 mg by mouth at bedtime. 05/24/21   [provider]  solifenacin  (VESICARE) 10 MG tablet Take 10 mg by mouth daily. 08/04/23 08/03/24  [provider]  zolpidem (AMBIEN) 10 MG tablet Take 10 mg by mouth at bedtime as needed. 03/18/22   [provider]    Physical Exam: Vitals:   09/30/23 1444 09/30/23 1445 09/30/23 1954  BP: (!) 188/83  (!) 177/82  Pulse: 75  73  Resp: 17  16  Temp: 98.8 F (37.1 C)  98.1 F (36.7 C)  TempSrc: Oral  Oral  SpO2: 98%  97%  Weight:  44.3 kg   Height:  5\' 2"  (1.575 m)    Physical Exam Vitals and nursing note reviewed.  Constitutional:      General: She is not in acute distress. HENT:     Head: Normocephalic and atraumatic.  Cardiovascular:     Rate and Rhythm: Normal rate and regular rhythm.     Heart sounds: Normal heart sounds.  Pulmonary:     Effort: Pulmonary effort is normal.     Breath sounds: Normal breath sounds.  Abdominal:     Palpations: Abdomen is soft.     Comments: Large parastomal hernia, some voluntary guarding but nontender See photo below  Neurological:     Mental Status: Mental status is at baseline.     Labs on Admission: I have personally reviewed following labs and imaging studies  CBC: Recent Labs  Lab 10/01/23 0028  WBC 7.7  NEUTROABS 5.4  HGB 11.0*  HCT 35.0*  MCV 90.4  PLT 278   Basic Metabolic Panel: Recent Labs  Lab 10/01/23 0028  NA 137  K 3.5  CL 98  CO2 24  GLUCOSE 98  BUN 17  CREATININE 1.30*  CALCIUM 8.8*   GFR: Estimated Creatinine Clearance: 13.2 mL/min (A) (by C-G formula based on SCr of 1.66 mg/dL (H)). Liver Function Tests: No results for input(s): "AST", "ALT", "ALKPHOS", "BILITOT", "PROT", "ALBUMIN" in the last 168 hours. No results for input(s): "LIPASE", "AMYLASE" in the last 168 hours. No results for input(s): "AMMONIA" in the last 168 hours. Coagulation Profile: No results for input(s): "INR", "PROTIME" in the last 168 hours. Cardiac Enzymes: No results for input(s): "CKTOTAL", "CKMB", "CKMBINDEX", "TROPONINI" in the  last 168 hours. BNP (last 3 results) No results for input(s): "PROBNP" in the last 8760 hours. HbA1C: No results for input(s): "HGBA1C" in the last 72 hours. CBG: No results for input(s): "GLUCAP" in the last 168 hours. Lipid Profile: No results for input(s): "CHOL", "HDL", "LDLCALC", "TRIG", "CHOLHDL", "LDLDIRECT" in the last 72 hours. Thyroid Function Tests: No results for input(s): "TSH", "T4TOTAL", "FREET4", "T3FREE", "THYROIDAB" in the last 72 hours. Anemia Panel: No results for input(s): "VITAMINB12", "FOLATE", "FERRITIN", "TIBC", "IRON", "RETICCTPCT" in the last 72 hours. Urine analysis:    Component Value Date/Time   COLORURINE YELLOW (A) 09/30/2023 1830   APPEARANCEUR CLEAR (A) 09/30/2023 1830   LABSPEC 1.012 09/30/2023 1830   PHURINE 5.0 09/30/2023 1830   GLUCOSEU NEGATIVE 09/30/2023 1830   HGBUR NEGATIVE 09/30/2023 1830   BILIRUBINUR NEGATIVE 09/30/2023 1830   KETONESUR NEGATIVE 09/30/2023 1830   PROTEINUR 30 (A) 09/30/2023 1830  NITRITE NEGATIVE 09/30/2023 1830   LEUKOCYTESUR NEGATIVE 09/30/2023 1830    Radiological Exams on Admission: CT Head Wo Contrast  Result Date: 10/01/2023 CLINICAL DATA:  Head trauma, minor (Age >= 65y) unwitnessed fall last night, found at 0200 in floor. Denies blood thinners. C/o left upper leg pain. Ems reports confusion at baseline. EXAM: CT HEAD WITHOUT CONTRAST TECHNIQUE: Contiguous axial images were obtained from the base of the skull through the vertex without intravenous contrast. RADIATION DOSE REDUCTION: This exam was performed according to the departmental dose-optimization program which includes automated exposure control, adjustment of the mA and/or kV according to patient size and/or use of iterative reconstruction technique. COMPARISON:  CT head 07/27/2021 FINDINGS: Brain: Cerebral ventricle sizes are concordant with the degree of cerebral volume loss. Patchy and confluent areas of decreased attenuation are noted throughout the  deep and periventricular white matter of the cerebral hemispheres bilaterally, compatible with chronic microvascular ischemic disease. No evidence of large-territorial acute infarction. No parenchymal hemorrhage. No mass lesion. No extra-axial collection. No mass effect or midline shift. No hydrocephalus. Basilar cisterns are patent. Vascular: No hyperdense vessel. Atherosclerotic calcifications are present within the cavernous internal carotid and vertebral arteries. Skull: No acute fracture or focal lesion. Temporomandibular joint degenerative changes. Sinuses/Orbits: Trace left mastoid air cell fluid. Paranasal sinuses and right mastoid air cells are clear. Bilateral lens replacement. Otherwise the orbits are unremarkable. Other: None. IMPRESSION: No acute intracranial abnormality. Electronically Signed   By: Tish Frederickson M.D.   On: 10/01/2023 01:13   CT Hip Left Wo Contrast  Result Date: 09/30/2023 CLINICAL DATA:  Suspected fracture, negative x-ray EXAM: CT OF THE LEFT HIP WITHOUT CONTRAST TECHNIQUE: Multidetector CT imaging of the left hip was performed according to the standard protocol. Multiplanar CT image reconstructions were also generated. RADIATION DOSE REDUCTION: This exam was performed according to the departmental dose-optimization program which includes automated exposure control, adjustment of the mA and/or kV according to patient size and/or use of iterative reconstruction technique. COMPARISON:  Radiograph 09/30/2023, CT 08/06/2023, 09/17/2023, 09/10/2021 FINDINGS: Bones/Joint/Cartilage Status post intramedullary rodding and screw fixation of the left femur for chronic left intertrochanteric fracture. Mild heterotopic ossification at the lesser trochanter. No dislocation. No convincing acute fracture. Old left inferior pubic ramus fracture. Heterogeneous sclerosis within the left sacrum, slightly progressive and consistent with subacute sacral insufficiency fracture. No sizable hip  effusion. Ligaments Suboptimally assessed by CT. Muscles and Tendons No intramuscular fluid collections. Soft tissues Advanced vascular calcifications. Incompletely visualized large left lower quadrant parastomal hernia containing mesentery and bowel, no adverse features on this exam. Negative for pelvic effusion. Clips in the pelvis. IMPRESSION: 1. Status post intramedullary rodding of the left femur for old intertrochanteric fracture. No definite acute osseous abnormality 2. Old left inferior pubic ramus fracture 3. Heterogeneous progressive sclerosis in the left sacrum consistent with healing/subacute sacral insufficiency fracture Electronically Signed   By: Jasmine Pang M.D.   On: 09/30/2023 23:03   DG Hip Unilat With Pelvis 2-3 Views Left  Result Date: 09/30/2023 CLINICAL DATA:  Fall EXAM: DG HIP (WITH OR WITHOUT PELVIS) 2-3V LEFT COMPARISON:  09/08/2021, 09/07/2021, abdomen radiograph 09/17/2023 FINDINGS: Numerous pelvic clips. Vascular calcifications. Status post intramedullary rodding of the left femur for old left intertrochanteric fracture. The hardware appears intact. No acute fracture abnormality is seen. Old appearing left pubic rami fractures. IMPRESSION: 1. No acute osseous abnormality. 2. Status post intramedullary rodding of the left femur for old left intertrochanteric fracture. Old left pubic rami fractures. Electronically Signed  By: Jasmine Pang M.D.   On: 09/30/2023 20:24     Data Reviewed: Relevant notes from primary care and specialist visits, past discharge summaries as available in EHR, including Care Everywhere. Prior diagnostic testing as pertinent to current admission diagnoses Updated medications and problem lists for reconciliation ED course, including vitals, labs, imaging, treatment and response to treatment Triage notes, nursing and pharmacy notes and ED provider's notes Notable results as noted in HPI   Assessment and Plan: * Sacral insufficiency fracture,  subacute Left hip and thigh pain with ambulatory dysfunction secondary to fall Frequent falls x 1 month CT left hip shows findings consistent with healing/subacute sacral insufficiency fracture Pain control PT eval  Protein calorie malnutrition, unspecified (HCC) Frailty and history of falls Advanced age Caregiver unable to cope Dietary, TOC and PT consult Palliatiave care consult Spoke at lengths with daughter Autumn Johnston, who states it is becoming increasingly difficult to care for the patient and they will be open to rehab and alternative placement  AKI (acute kidney injury) (HCC) History of hydronephrosis right kidney 07/2023 with no obstructive calculus Creatinine 1.30 up from baseline of 0.88 Urinalysis unremarkable Patient is on surveillance/conservative management for hydronephrosis Monitor renal function and if worsening consider CT abdomen and pelvis to evaluate for worsening hydronephrosis  Hypertensive urgency Continue home amlodipine  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 Inpatient status is judged to be reasonable and necessary in order to provide the required intensity of service to ensure the patient's safety. The patient's presenting symptoms, physical exam findings, and initial radiographic and laboratory data in the context of their chronic comorbidities is felt to place them at  high risk for further clinical deterioration. Furthermore, it is not anticipated that the patient will be medically stable for discharge from the hospital within 2 midnights of admission.   * I certify that at the point of admission it is my clinical judgment that the patient will require inpatient hospital care spanning beyond 2 midnights from the point of admission due to high intensity of service, high risk for further deterioration and high frequency of surveillance required.*  Author: Andris Baumann, MD 10/01/2023 12:25 AM  For on call review [email or web address].

## 2023-10-01 NOTE — Assessment & Plan Note (Deleted)
Incidental finding on CT in August-no obstructive calculus on outpatient surveillance

## 2023-10-01 NOTE — Assessment & Plan Note (Addendum)
Left hip and thigh pain with ambulatory dysfunction secondary to fall Frequent falls x 1 month CT left hip shows findings consistent with healing/subacute sacral insufficiency fracture Pain control PT eval

## 2023-10-01 NOTE — Hospital Course (Signed)
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  She did not hit her head and had no LOC.  Since the fall she has been having left hip and thigh pain.  At baseline ambulates with a walker but has been unable to ambulate since the fall. ED course and data review: BP elevated at 188/83 with otherwise normal vitals EKG*** Labs:*** Head CT*** CT hip shows subacute sacral insufficiency fracture and other older findings as follows:  IMPRESSION: 1. Status post intramedullary rodding of the left femur for old intertrochanteric fracture. No definite acute osseous abnormality 2. Old left inferior pubic ramus fracture 3. Heterogeneous progressive sclerosis in the left sacrum consistent with healing/subacute sacral insufficiency fracture   Hospitalist consulted for admission

## 2023-10-01 NOTE — Progress Notes (Signed)
Mobility Specialist - Progress Note   10/01/23 1000  Mobility  Activity Ambulated with assistance in room;Transferred from bed to chair  Level of Assistance Moderate assist, patient does 50-74%  Assistive Device Front wheel walker  Distance Ambulated (ft) 6 ft  Activity Response Tolerated well  $Mobility charge 1 Mobility     Pt lying in bed upon arrival, utilizing RA. Pt pleasantly confused. Noted soiled sheets upon standing. New linen/gown provided. Completed bed mobility with minG. TotalA for donning LB clothing and peri-care d/t need for BUE support. Trembly and very unsteady with activity. Pt ambulated to recliner with modA. Pt left in chair, sitting at nurses station.    Filiberto Pinks Mobility Specialist 10/01/23, 10:58 AM

## 2023-10-01 NOTE — Progress Notes (Signed)
Progress Note   Patient: Autumn Johnston ZOX:[id number] DOB: 1922-05-01 DOA: 09/30/2023     0 DOS: the patient was seen and examined on 10/01/2023     Subjective:  Patient seen and examined at bedside this morning Patient's daughter was present at bedside Patient did have some episode of confusion According to the family they are unable to take care of patient at home She denies nausea vomiting chest pain or abdominal pain   Brief hospital course: From HPI "Autumn Johnston is a 89 y.o. 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 The history is taken from her daughter who said that since her discharge on 08/12/23 she has fallen at least five times when trying to get out of bed. Says she does not sleep and tries to get out the bed. On previous occasions she has been helped up and able to ambulate but this time she was unable to.  Patient lives alone since her husband died in 2023-03-25 and three children take turns spending the day and night with her. She also says that she is declining mentally and saying things that don't make sense. It is getting increasingly difficult to care for her mother at home as all children are in their seventies. ED course and data review: BP elevated at 188/83 with otherwise normal vitals EKG NSR at 87 with nonspecific ST-T wave changes Labs: CBC and BMP done and notable for creatinine 1.3 up from baseline of 0.88 given concerns of  sacral fracture hospitalist service was therefore contacted to admit patient for further management     Assessment and Plan: Sacral insufficiency fracture, subacute Left hip and thigh pain with ambulatory dysfunction secondary to fall Frequent falls x 1 month CT left hip shows findings consistent with healing/subacute sacral insufficiency fracture Continue current pain management Continue PT OT eval TOC consulted and case discussed   Protein calorie malnutrition, unspecified (HCC) Frailty and history of falls Advanced age Caregiver unable to cope Dietary, TOC and PT consult Palliatiave care consult I discussed with patient's daughter.  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  Constitutional: Patient seen in no acute distress HENT:     Head: Normocephalic and atraumatic.  Cardiovascular:     Rate and Rhythm: Normal rate and regular rhythm.  Heart sounds: Normal heart sounds.  Pulmonary:     Effort: Pulmonary effort is normal.     Breath sounds: Normal breath sounds.  Abdominal:     Palpations: Abdomen is soft.     Comments: Large parastomal hernia, some voluntary  guarding but nontender  Neurological:     Mental Status: Mental status is at baseline.    Data Reviewed: I have personally reviewed patient's CT scan of the brain that did not show any acute intracranial pathology, I have also reviewed patient's CT scan of the hip that showed subacute sacral fracture as well as old left inferior pubic rami fracture .  I have also reviewed patient's CBC as well as CMP, admitting provider documentation, PT OT documentation as well as nursing documentation  Time spent: 57 minutes   Vitals:   10/01/23 0207 10/01/23 0247 10/01/23 0340 10/01/23 1102  BP: (!) 178/84 (!) 190/87 (!) 164/89 (!) 180/78  Pulse: 80 81 79 80  Resp:  16  19  Temp:  97.6 F (36.4 C)  97.8 F (36.6 C)  TempSrc:  Oral  Oral  SpO2: 98% 99%  100%  Weight:      Height:          Latest Ref Rng & Units 10/01/2023    4:38 AM 10/01/2023   12:28 AM 09/17/2023   12:33 PM  CBC  WBC 4.0 - 10.5 K/uL 7.1  7.7  7.8   Hemoglobin 12.0 - 15.0 g/dL 30.8  65.7  84.6   Hematocrit 36.0 - 46.0 % 34.4  35.0  34.9   Platelets 150 - 400 K/uL 281  278  331        Latest Ref Rng & Units 10/01/2023    4:38 AM 10/01/2023   12:28 AM 09/17/2023   12:33 PM  CMP  Glucose 70 - 99 mg/dL 90  98  97   BUN 8 - 23 mg/dL 16  17  29    Creatinine 0.44 - 1.00 mg/dL 9.62  9.52  8.41   Sodium 135 - 145 mmol/L 136  137  136   Potassium 3.5 - 5.1 mmol/L 3.5  3.5  3.9   Chloride 98 - 111 mmol/L 99  98  95   CO2 22 - 32 mmol/L 25  24  27    Calcium 8.9 - 10.3 mg/dL 8.6  8.8  8.6   Total Protein 6.5 - 8.1 g/dL  6.9  7.3   Total Bilirubin 0.3 - 1.2 mg/dL  1.1  0.6   Alkaline Phos 38 - 126 U/L  67  89   AST 15 - 41 U/L  22  16   ALT 0 - 44 U/L  14  10      Author: Loyce Dys, MD 10/01/2023 3:05 PM  For on call review [email or web address].

## 2023-10-01 NOTE — ED Notes (Signed)
Patient transported to CT 

## 2023-10-01 NOTE — ED Notes (Signed)
ED TO INPATIENT HANDOFF REPORT  ED Nurse Name and Phone #: Victorino Dike [phone]  V Name/Age/Gender Autumn Johnston 89 y.o. female Room/Bed: ED34A/ED34A  Code Status   Code Status: Limited: Do not attempt resuscitation (DNR) -DNR-LIMITED -Do Not Intubate/DNI   Home/SNF/Other Home Patient oriented to: self Is this baseline? Yes   Triage Complete: Triage complete  Chief Complaint AKI (acute kidney injury) (HCC) [N17.9]  Triage Note First nurse note: pt to ED [facility] from home for unwitnessed fall last night, found at 0200 in floor. Denies blood thinners. C/o left upper leg pain. Ems reports confusion at baseline.   See first nurse note   Allergies No Known Allergies  Level of Care/Admitting Diagnosis ED Disposition     ED Disposition  Admit   Condition  --   Comment  Hospital Area: [facility] [[id number]]  Level of Care: Med-Surg [16]  Covid Evaluation: Asymptomatic - no recent exposure (last 10 days) testing not required  Diagnosis: AKI (acute kidney injury) [facility]) [[id number]]  Admitting Physician: Andris Baumann [[id number]]  Attending Physician: Andris Baumann [[id number]]  Certification:: I certify this patient will need inpatient services for at least 2 midnights  Expected Medical Readiness: 10/04/2023          B Medical/Surgery History Past Medical History:  Diagnosis Date   CKD (chronic kidney disease) stage 4, GFR 15-29 ml/min (HCC)    Colon cancer (HCC)    s/p colostomy   Dementia (HCC)    Depression    Hypertension    Parastomal hernia    Past Surgical History:  Procedure Laterality Date   COLECTOMY WITH COLOSTOMY CREATION/HARTMANN PROCEDURE     INTRAMEDULLARY (IM) NAIL INTERTROCHANTERIC Left 09/08/2021   Procedure: INTRAMEDULLARY (IM) NAIL INTERTROCHANTRIC;  Surgeon: Signa Kell, MD;  Location: [facility] ORS;  Service: Orthopedics;  Laterality: Left;     A IV Location/Drains/Wounds Patient Lines/Drains/Airways Status     Active  Line/Drains/Airways     Name Placement date Placement time Site Days   Peripheral IV 10/01/23 20 G Right Antecubital 10/01/23  0046  Antecubital  less than 1   Colostomy LLQ 08/01/21  0900  LLQ  791   Pressure Injury 08/09/23 Sacrum Stage 2 -  Partial thickness loss of dermis presenting as a shallow open injury with a red, pink wound bed without slough. skin tear to gluteal cleft 08/09/23  0200  -- 53            Intake/Output Last 24 hours No intake or output data in the 24 hours ending 10/01/23 0151  Labs/Imaging Results for orders placed or performed during the hospital encounter of 09/30/23 (from the past 48 hour(s))  Urinalysis, Routine w reflex microscopic -Urine, Clean Catch     Status: Abnormal   Collection Time: 09/30/23  6:30 PM  Result Value Ref Range   Color, Urine YELLOW (A) YELLOW   APPearance CLEAR (A) CLEAR   Specific Gravity, Urine 1.012 1.005 - 1.030   pH 5.0 5.0 - 8.0   Glucose, UA NEGATIVE NEGATIVE mg/dL   Hgb urine dipstick NEGATIVE NEGATIVE   Bilirubin Urine NEGATIVE NEGATIVE   Ketones, ur NEGATIVE NEGATIVE mg/dL   Protein, ur 30 (A) NEGATIVE mg/dL   Nitrite NEGATIVE NEGATIVE   Leukocytes,Ua NEGATIVE NEGATIVE   RBC / HPF 0-5 0 - 5 RBC/hpf   WBC, UA 0-5 0 - 5 WBC/hpf   Bacteria, UA RARE (A) NONE SEEN   Squamous Epithelial / HPF 0-5 0 - 5 /HPF  Comment: Performed at [facility], [address] [place], [place] [address]  Comprehensive metabolic panel     Status: Abnormal   Collection Time: 10/01/23 12:28 AM  Result Value Ref Range   Sodium 137 135 - 145 mmol/L   Potassium 3.5 3.5 - 5.1 mmol/L   Chloride 98 98 - 111 mmol/L   CO2 24 22 - 32 mmol/L   Glucose, Bld 98 70 - 99 mg/dL    Comment: Glucose reference range applies only to samples taken after fasting for at least 8 hours.   BUN 17 8 - 23 mg/dL   Creatinine, Ser 6.04 (H) 0.44 - 1.00 mg/dL   Calcium 8.8 (L) 8.9 - 10.3 mg/dL   Total Protein 6.9 6.5 - 8.1 g/dL   Albumin 3.7 3.5 - 5.0  g/dL   AST 22 15 - 41 U/L   ALT 14 0 - 44 U/L   Alkaline Phosphatase 67 38 - 126 U/L   Total Bilirubin 1.1 0.3 - 1.2 mg/dL   GFR, Estimated 37 (L) >60 mL/min    Comment: (NOTE) Calculated using the CKD-EPI Creatinine Equation (2021)    Anion gap 15 5 - 15    Comment: Performed at [facility], [address] [place], [place] [address]  CBC with Differential     Status: Abnormal   Collection Time: 10/01/23 12:28 AM  Result Value Ref Range   WBC 7.7 4.0 - 10.5 K/uL   RBC 3.87 3.87 - 5.11 MIL/uL   Hemoglobin 11.0 (L) 12.0 - 15.0 g/dL   HCT 11.9 (L) 14.7 - 82.9 %   MCV 90.4 80.0 - 100.0 fL   MCH 28.4 26.0 - 34.0 pg   MCHC 31.4 30.0 - 36.0 g/dL   RDW 56.2 13.0 - 86.5 %   Platelets 278 150 - 400 K/uL   nRBC 0.0 0.0 - 0.2 %   Neutrophils Relative % 70 %   Neutro Abs 5.4 1.7 - 7.7 K/uL   Lymphocytes Relative 19 %   Lymphs Abs 1.5 0.7 - 4.0 K/uL   Monocytes Relative 10 %   Monocytes Absolute 0.7 0.1 - 1.0 K/uL   Eosinophils Relative 0 %   Eosinophils Absolute 0.0 0.0 - 0.5 K/uL   Basophils Relative 1 %   Basophils Absolute 0.0 0.0 - 0.1 K/uL   Immature Granulocytes 0 %   Abs Immature Granulocytes 0.03 0.00 - 0.07 K/uL    Comment: Performed at [facility], [address] [place], [place] [address]   CT Head Wo Contrast  Result Date: 10/01/2023 CLINICAL DATA:  Head trauma, minor (Age >= 65y) unwitnessed fall last night, found at 0200 in floor. Denies blood thinners. C/o left upper leg pain. Ems reports confusion at baseline. EXAM: CT HEAD WITHOUT CONTRAST TECHNIQUE: Contiguous axial images were obtained from the base of the skull through the vertex without intravenous contrast. RADIATION DOSE REDUCTION: This exam was performed according to the departmental dose-optimization program which includes automated exposure control, adjustment of the mA and/or kV according to patient size and/or use of iterative reconstruction technique. COMPARISON:  CT head 07/27/2021  FINDINGS: Brain: Cerebral ventricle sizes are concordant with the degree of cerebral volume loss. Patchy and confluent areas of decreased attenuation are noted throughout the deep and periventricular white matter of the cerebral hemispheres bilaterally, compatible with chronic microvascular ischemic disease. No evidence of large-territorial acute infarction. No parenchymal hemorrhage. No mass lesion. No extra-axial collection. No mass effect or midline shift. No hydrocephalus. Basilar  cisterns are patent. Vascular: No hyperdense vessel. Atherosclerotic calcifications are present within the cavernous internal carotid and vertebral arteries. Skull: No acute fracture or focal lesion. Temporomandibular joint degenerative changes. Sinuses/Orbits: Trace left mastoid air cell fluid. Paranasal sinuses and right mastoid air cells are clear. Bilateral lens replacement. Otherwise the orbits are unremarkable. Other: None. IMPRESSION: No acute intracranial abnormality. Electronically Signed   By: Tish Frederickson M.D.   On: 10/01/2023 01:13   CT Hip Left Wo Contrast  Result Date: 09/30/2023 CLINICAL DATA:  Suspected fracture, negative x-ray EXAM: CT OF THE LEFT HIP WITHOUT CONTRAST TECHNIQUE: Multidetector CT imaging of the left hip was performed according to the standard protocol. Multiplanar CT image reconstructions were also generated. RADIATION DOSE REDUCTION: This exam was performed according to the departmental dose-optimization program which includes automated exposure control, adjustment of the mA and/or kV according to patient size and/or use of iterative reconstruction technique. COMPARISON:  Radiograph 09/30/2023, CT 08/06/2023, 09/17/2023, 09/10/2021 FINDINGS: Bones/Joint/Cartilage Status post intramedullary rodding and screw fixation of the left femur for chronic left intertrochanteric fracture. Mild heterotopic ossification at the lesser trochanter. No dislocation. No convincing acute fracture. Old left  inferior pubic ramus fracture. Heterogeneous sclerosis within the left sacrum, slightly progressive and consistent with subacute sacral insufficiency fracture. No sizable hip effusion. Ligaments Suboptimally assessed by CT. Muscles and Tendons No intramuscular fluid collections. Soft tissues Advanced vascular calcifications. Incompletely visualized large left lower quadrant parastomal hernia containing mesentery and bowel, no adverse features on this exam. Negative for pelvic effusion. Clips in the pelvis. IMPRESSION: 1. Status post intramedullary rodding of the left femur for old intertrochanteric fracture. No definite acute osseous abnormality 2. Old left inferior pubic ramus fracture 3. Heterogeneous progressive sclerosis in the left sacrum consistent with healing/subacute sacral insufficiency fracture Electronically Signed   By: Jasmine Pang M.D.   On: 09/30/2023 23:03   DG Hip Unilat With Pelvis 2-3 Views Left  Result Date: 09/30/2023 CLINICAL DATA:  Fall EXAM: DG HIP (WITH OR WITHOUT PELVIS) 2-3V LEFT COMPARISON:  09/08/2021, 09/07/2021, abdomen radiograph 09/17/2023 FINDINGS: Numerous pelvic clips. Vascular calcifications. Status post intramedullary rodding of the left femur for old left intertrochanteric fracture. The hardware appears intact. No acute fracture abnormality is seen. Old appearing left pubic rami fractures. IMPRESSION: 1. No acute osseous abnormality. 2. Status post intramedullary rodding of the left femur for old left intertrochanteric fracture. Old left pubic rami fractures. Electronically Signed   By: Jasmine Pang M.D.   On: 09/30/2023 20:24    Pending Labs Unresulted Labs (From admission, onward)     Start     Ordered   10/01/23 0500  Basic metabolic panel  Tomorrow morning,   R        10/01/23 0128   10/01/23 0500  CBC  Tomorrow morning,   R        10/01/23 0128            Vitals/Pain Today's Vitals   09/30/23 1445 09/30/23 1954 10/01/23 0052 10/01/23 0130  BP:   (!) 177/82 (!) 188/75 (!) 190/82  Pulse:  73 81 80  Resp:  16 17   Temp:  98.1 F (36.7 C) 97.8 F (36.6 C)   TempSrc:  Oral Oral   SpO2:  97% 97% 96%  Weight: 44.3 kg     Height: 5\' 2"  (1.575 m)     PainSc: 0-No pain       Isolation Precautions No active isolations  Medications Medications  amLODipine (NORVASC) tablet 10  mg (has no administration in time range)  isosorbide mononitrate (IMDUR) 24 hr tablet 15 mg (has no administration in time range)  simvastatin (ZOCOR) tablet 20 mg (has no administration in time range)  ALPRAZolam (XANAX) tablet 0.25 mg (has no administration in time range)  polyethylene glycol (MIRALAX / GLYCOLAX) packet 17 g (has no administration in time range)  fesoterodine (TOVIAZ) tablet 4 mg (has no administration in time range)  enoxaparin (LOVENOX) injection 30 mg (has no administration in time range)  acetaminophen (TYLENOL) tablet 650 mg (has no administration in time range)    Or  acetaminophen (TYLENOL) suppository 650 mg (has no administration in time range)  ondansetron (ZOFRAN) tablet 4 mg (has no administration in time range)    Or  ondansetron (ZOFRAN) injection 4 mg (has no administration in time range)  HYDROcodone-acetaminophen (NORCO/VICODIN) 5-325 MG per tablet 1-2 tablet (has no administration in time range)  morphine (PF) 2 MG/ML injection 2 mg (has no administration in time range)    Mobility walks with person assist     Focused Assessments Neuro Assessment Handoff:  Swallow screen pass? Yes          Neuro Assessment:   Neuro Checks:      Has TPA been given? No If patient is a Neuro Trauma and patient is going to OR before floor call report to 4N Charge nurse: [phone] or [phone]   R Recommendations: See Admitting Provider Note  Report given to:   Additional Notes: Pt pleasant dementia - cooperative and follows commands. Admission for frequent falls. Pt is incontinent.

## 2023-10-01 NOTE — Assessment & Plan Note (Signed)
-  Continue home amlodipine 

## 2023-10-01 NOTE — Consult Note (Signed)
Consultation Note Date: 10/01/2023   Patient Name: Autumn Johnston  DOB: 09-22-1922  MRN: [id number]  Age / Sex: 89 y.o., female  PCP: Marguarite Arbour, MD Referring Physician: Loyce Dys, MD  Reason for Consultation: Establishing goals of care   HPI/Brief Hospital Course: 89 y.o. female  with past medical history of hypertension, hyperlipidemia, CKD stage IIIb with anemia, colon cancers status post colostomy with large parastomal hernia admitted on 09/30/2023 from home with recurrent falls at home.  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 Status post intramedullary rodding of the left femur for old intertrochanteric fracture. No definite acute osseous abnormality 2. Old left inferior pubic ramus fracture 3. Heterogeneous progressive sclerosis in the left sacrum consistent with healing/subacute sacral insufficiency fracture  Admitted and being treated for left hip and thigh pain with ambulatory dysfunction, frailty and acute kidney injury with known history of hydronephrosis to right kidney with no obstructive calculus  Palliative medicine was consulted for assisting with goals of care conversations.  Subjective:  Extensive chart review has been completed prior to meeting patient including labs, vital signs, imaging, progress notes, orders, and available advanced directive documents from current and previous encounters.  Met with Autumn Johnston at her bedside found to be sitting in recliner at nurses station as she was constantly attempting to get out of bed.  Brought to nurses station for safety.  She is awake alert and able to engage in conversation.  She is aware she is at a hospital but unclear as to reasons for being here and also unclear to other orientation questions.  No family at bedside during time of visit.  Called  and spoke with daughter-[name] and met with her at bedside.  Introduced myself as a Publishing rights manager as a member of the palliative care team. Explained palliative medicine is specialized medical care for people living with serious illness. It focuses on providing relief from the symptoms and stress of a serious illness. The goal is to improve quality of life for both the patient and the family.   Tinnie Gens shares since Autumn Johnston lost her husband in March the family has noticed a significant functional and cognitive decline in their mother. Tinnie Gens has a sister Marylu Lund and a son Gerlene Burdock and collectively they rotate with spending days and nights with Autumn Johnston as she is no longer safe to live independently. Tinnie Gens shares over the last 2 to 3 months she has noticed a significant cognitive decline with Autumn Johnston. Tinnie Gens shares the difficulty she and her siblings are having with continuing to care for Autumn Johnston at home, she shares since discharge Autumn Johnston has had at least 5 falls.  Tinnie Gens shares at home Autumn Johnston spends the majority of the time in her recliner and requires assistance with all ADLs.  Tinnie Gens shares as a family they have discussed and feel as though placement in a facility is the best next step.  They are aware of the process needed to qualify for rehab and hope initially Ms. Reback can be placed in STR.  Long-term goal for Autumn Johnston to transition to either ALF/LTC.  Tinnie Gens shares over the last several months Autumn Johnston has made comments about being "tired" and ready to be with her husband.  Attempted to elicit goals of care.  We discussed the difference between continuing with current plan of care versus shifting her focus to a  comfort care approach.  We also discussed the overall philosophy of hospice being a focus on providing comfort and dignity at end-of-life.  Discussed that rehab and comfort/hospice cannot coincide.  We also discussed the role of outpatient  palliative care being an extra layer of support in the community and helping with the transition to hospice if/when family felt most appropriate.  Tinnie Gens shares at this time her goal remains for Autumn Johnston to be placed in short-term rehab, with outpatient palliative following, with transition to ALF/LTC and a transition to hospice services when family feels most appropriate.  I discussed importance of continued conversations with family/support persons and all members of their medical team regarding overall plan of care and treatment options ensuring decisions are in alignment with patients goals of care.  All questions/concerns addressed. PMT will continue to follow and support patient as needed.   Objective: Primary Diagnoses: Present on Admission:  Hypertensive urgency  Frailty  Protein calorie malnutrition, unspecified (HCC)  Hydronephrosis of right kidney  AKI (acute kidney injury) (HCC)   Physical Exam Constitutional:      General: She is not in acute distress.    Appearance: She is ill-appearing.  Pulmonary:     Effort: Pulmonary effort is normal. No respiratory distress.  Neurological:     Mental Status: She is alert. She is disoriented.     Motor: Weakness present.     Vital Signs: BP (!) 96/55 (BP Location: Left Arm)   Pulse 74   Temp 97.8 F (36.6 C)   Resp 16   Ht 5\' 2"  (1.575 m)   Wt 44.3 kg   SpO2 98%   BMI 17.86 kg/m  Pain Scale: 0-10 POSS *See Group Information*: 1-Acceptable,Awake and alert Pain Score: 0-No pain   IO: Intake/output summary: No intake or output data in the 24 hours ending 10/01/23 1710  LBM: Last BM Date : 10/01/23 Baseline Weight: Weight: 44.3 kg Most recent weight: Weight: 44.3 kg       Palliative Assessment/Data:50%   Assessment and Plan  SUMMARY OF RECOMMENDATIONS   Outpatient palliative referral once disposition established  Palliative Prophylaxis:   Bowel Regimen, Delirium Protocol and Frequent Pain  Assessment   Discussed With: TOC   Thank you for this consult and allowing Palliative Medicine to participate in the care of Autumn Johnston. Palliative medicine will continue to follow and assist as needed.   Time Total: 75 minutes  Time spent includes: Detailed review of medical records (labs, imaging, vital signs), medically appropriate exam (mental status, respiratory, cardiac, skin), discussed with treatment team, counseling and educating patient, family and staff, documenting clinical information, medication management and coordination of care.   Signed by: Leeanne Deed, DNP, AGNP-C Palliative Medicine    Please contact Palliative Medicine Team phone at [phone] for questions and concerns.  For individual provider: See Loretha Stapler

## 2023-10-01 NOTE — Assessment & Plan Note (Addendum)
Parastomal hernia Patient fell onto left side and has some voluntary guarding with palpation of abdomen but acute issues not suspected at this time Continue to monitor

## 2023-10-01 NOTE — Assessment & Plan Note (Addendum)
Depression Continue alprazolam as needed Sertraline was dc'd in August due to SIADH related hyponatremia Delirium precautions

## 2023-10-01 NOTE — Progress Notes (Signed)
Initial Nutrition Assessment  DOCUMENTATION CODES:   Severe malnutrition in context of chronic illness  INTERVENTION:   Ensure Enlive po BID, each supplement provides 350 kcal and 20 grams of protein.  Magic cup TID with meals, each supplement provides 290 kcal and 9 grams of protein  MVI po daily   Dysphagia 3 diet  Pt at high refeed risk; recommend monitor potassium, magnesium and phosphorus labs daily until stable  Daily weights   NUTRITION DIAGNOSIS:   Severe Malnutrition related to chronic illness as evidenced by severe fat depletion, severe muscle depletion.  GOAL:   Patient will meet greater than or equal to 90% of their needs  MONITOR:   PO intake, Supplement acceptance, Labs, Weight trends, I & O's, Skin  REASON FOR ASSESSMENT:   Consult Assessment of nutrition requirement/status  ASSESSMENT:   89 y.o. 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  Visited pt's room today. Pt is confused and agitated; pt wanting to get out of bed. Pt is unable to provide any history. RD suspects pt with poor oral intake at baseline as pt is malnourished. RD will add supplements and MVI to help pt meet her estimated needs. Pt is at high refeed risk. Per chart, pt appears weight stable pta. Palliative care consult is pending.   Medications reviewed and include: lovenox  Labs reviewed: K 3.5 wnl, creat 1.26(H)  NUTRITION - FOCUSED PHYSICAL EXAM:  Flowsheet Row Most Recent Value  Orbital Region Moderate depletion  Upper Arm  Region Severe depletion  Thoracic and Lumbar Region Severe depletion  Buccal Region Moderate depletion  Temple Region Moderate depletion  Clavicle Bone Region Severe depletion  Clavicle and Acromion Bone Region Severe depletion  Scapular Bone Region Severe depletion  Dorsal Hand Severe depletion  Patellar Region Severe depletion  Anterior Thigh Region Severe depletion  Posterior Calf Region Severe depletion  Edema (RD Assessment) Mild  Hair Reviewed  Eyes Reviewed  Mouth Reviewed  Skin Reviewed  Nails Reviewed   Diet Order:   Diet Order             DIET DYS 3 Room service appropriate? No; Fluid consistency: Thin  Diet effective now                  EDUCATION NEEDS:   No education needs have been identified at this time  Skin:  Skin Assessment: Reviewed RN Assessment (Stage II sacrum)  Last BM:  10/11  Height:   Ht Readings from Last 1 Encounters:  09/30/23 5\' 2"  (1.575 m)    Weight:   Wt Readings from Last 1 Encounters:  09/30/23 44.3 kg    Ideal Body Weight:  50 kg  BMI:  Body mass index is 17.86 kg/m.  Estimated Nutritional Needs:   Kcal:  1100-1300kcal/day  Protein:  55-65g/day  Fluid:  1.1-1.3L/day  Betsey Holiday MS, RD, LDN Please refer to [facility] for RD and/or RD on-call/weekend/after hours pager

## 2023-10-01 NOTE — Assessment & Plan Note (Addendum)
History of hydronephrosis right kidney 07/2023 with no obstructive calculus Creatinine 1.30 up from baseline of 0.88 Urinalysis unremarkable Patient is on surveillance/conservative management for hydronephrosis Monitor renal function and if worsening consider CT abdomen and pelvis to evaluate for worsening hydronephrosis

## 2023-10-01 NOTE — IPAL (Signed)
  Interdisciplinary Goals of Care Family Meeting   Date carried out: 10/01/2023  Location of the meeting: Phone conference  Member's involved: Physician and Family Member or next of kin  Durable Power of Attorney or acting medical decision maker: Daughter Jeanann Lewandowsky  Discussion: We discussed goals of care for [employer] .   I have reviewed medical records including EPIC notes, labs and imaging, assessed the patient and then met with daughter Jeanann Lewandowsky to discuss major active diagnoses, plan of care, natural trajectory, prognosis, GOC, EOL wishes, disposition and options including Full code/DNI/DNR and the concept of comfort care if DNR is elected. Questions and concerns were addressed. They are  in agreement to continue current plan of care . Election for DNR/DNI status.   Code status:   Code Status: Prior   Disposition: Continue current acute care  Time spent for the meeting: 40    Andris Baumann, MD  10/01/2023, 12:50 AM

## 2023-10-01 NOTE — Assessment & Plan Note (Addendum)
Frailty and history of falls Advanced age Caregiver unable to cope Dietary, TOC and PT consult Palliatiave care consult Spoke at lengths with daughter Riley Kill, who states it is becoming increasingly difficult to care for the patient and they will be open to rehab and alternative placement

## 2023-10-01 NOTE — Progress Notes (Signed)
Pt admitted to unit, oriented to surroundings, fall protocol, call light system and belonging policy. Pt presents with some confusion oriented to self and place. Able to answer some assessment questions after many attempts. Admission assessment unable to be fully completed due to pt's cognitive status. Will be endorsed to upcoming primary nurse for completion when daughter arrives on unit.

## 2023-10-01 NOTE — Evaluation (Signed)
Physical Therapy Evaluation Patient Details Name: Levenia Gainous MRN: [id number] DOB: Mar 01, 1922 Today's Date: 10/01/2023  History of Present Illness  Pt is a 89 y.o. female in from a recent fall. Sacral Insufficiency Fracture, Subacute were noted following the fall. Pt also has a large abdominal hernia at her LLQ. PmHx includes: Dementia, falls, HTN, and L pubic Ramus Fx.   Clinical Impression  Pt received in recliner and agreed to PT session. Pt daughter Antony Contras was also present for today's session. Pt performed 4 STS with the use of RW (2wheels) MinA. After the 3rd STS, pt amb 21ft CGA with chair follow.  After each STS, pt needed a rest break due to feeling "woozy". Pt BP started at 105/58 at the beginning of the session and was 118/65 at the end of the session. Pt presented with unsteadiness and trembling when standing and with activity. Pt ended PT session in the recliner. Pt tolerated Tx fair today, however pt demonstrated a lack of endurance, activity tolerance, generalized weakness, deficits in gait and balance. Pt would benefit from continued PT sessions to maximize safety and improve functional mobility.       If plan is discharge home, recommend the following: A little help with walking and/or transfers;A lot of help with bathing/dressing/bathroom;Assistance with cooking/housework;Assist for transportation;Help with stairs or ramp for entrance;Supervision due to cognitive status   Can travel by private vehicle   Yes    Equipment Recommendations Rolling walker (2 wheels)  Recommendations for Other Services       Functional Status Assessment Patient has had a recent decline in their functional status and demonstrates the ability to make significant improvements in function in a reasonable and predictable amount of time.     Precautions / Restrictions Precautions Precautions: Fall Restrictions Weight Bearing Restrictions: No      Mobility  Bed Mobility                     Transfers Overall transfer level: Needs assistance Equipment used: Rolling walker (2 wheels) Transfers: Sit to/from Stand Sit to Stand: Min assist           General transfer comment: Pt performed a total of 4 STS with the use of RW (2wheels) MinA. After each STS pt needed a rest break due to feeling "woozy". While sitting pt BP read 105/58. Spo2 98 and HR 86. While standing pt BP read 104/91.    Ambulation/Gait Ambulation/Gait assistance: Contact guard assist Gait Distance (Feet): 15 Feet Assistive device: Rolling walker (2 wheels) Gait Pattern/deviations: Step-through pattern Gait velocity: decreased     General Gait Details: Pt amb 32ft with the use of RW (2wheels) CGA and a chair follow. Pt needed a rest after amb and reported feeling "woozy". After amb, pt BP read 118/65.  Stairs            Wheelchair Mobility     Tilt Bed    Modified Rankin (Stroke Patients Only)       Balance Overall balance assessment: Needs assistance Sitting-balance support: Bilateral upper extremity supported, Feet supported Sitting balance-Leahy Scale: Fair     Standing balance support: Bilateral upper extremity supported Standing balance-Leahy Scale: Fair                               Pertinent Vitals/Pain Pain Assessment Pain Assessment: No/denies pain (Pt states that she sometimes experiences pain at her Left thigh.)    Home  Living Family/patient expects to be discharged to:: Private residence Living Arrangements: Alone;Children Available Help at Discharge: Friend(s);Available PRN/intermittently Type of Home: House Home Access: Stairs to enter Entrance Stairs-Rails: Can reach both Entrance Stairs-Number of Steps: 3   Home Layout: One level Home Equipment: Agricultural consultant (2 wheels);BSC/3in1 Additional Comments: Pt lives alone, however children alternate staying with her to help. Children have concerns regarding the physical limits they are  starting to have when it comes to taking care of the pt.    Prior Function Prior Level of Function : Needs assist  Cognitive Assist : ADLs (cognitive);Mobility (cognitive)     Physical Assist : Mobility (physical);ADLs (physical) Mobility (physical): Transfers;Gait;Stairs ADLs (physical): Bathing;Dressing;Toileting;IADLs Mobility Comments: limited household ambulator, children there with supervision ADLs Comments: children assist with IADL's     Extremity/Trunk Assessment   Upper Extremity Assessment Upper Extremity Assessment: Generalized weakness    Lower Extremity Assessment Lower Extremity Assessment: Generalized weakness       Communication   Communication Communication: Hearing impairment Cueing Techniques: Verbal cues;Tactile cues  Cognition Arousal: Alert Behavior During Therapy: WFL for tasks assessed/performed Overall Cognitive Status: History of cognitive impairments - at baseline                                 General Comments: Pt has dementia however was able to follow commands/instructions necessary during today's session. Pt was oriented to self and family member that was in the room. Pt was unaware of where she was and why.        General Comments      Exercises     Assessment/Plan    PT Assessment Patient needs continued PT services  PT Problem List Decreased strength;Decreased activity tolerance;Decreased balance;Decreased mobility;Decreased safety awareness;Pain       PT Treatment Interventions DME instruction;Gait training;Therapeutic activities    PT Goals (Current goals can be found in the Care Plan section)  Acute Rehab PT Goals Patient Stated Goal: To mobilize without being nervous PT Goal Formulation: With patient/family Time For Goal Achievement: 10/15/23 Potential to Achieve Goals: Good    Frequency Min 1X/week     Co-evaluation               AM-PAC PT "6 Clicks" Mobility  Outcome Measure Help needed  turning from your back to your side while in a flat bed without using bedrails?: A Lot Help needed moving from lying on your back to sitting on the side of a flat bed without using bedrails?: A Little Help needed moving to and from a bed to a chair (including a wheelchair)?: A Little Help needed standing up from a chair using your arms (e.g., wheelchair or bedside chair)?: A Little Help needed to walk in hospital room?: A Little Help needed climbing 3-5 steps with a railing? : A Lot 6 Click Score: 16    End of Session Equipment Utilized During Treatment: Gait belt Activity Tolerance: Patient limited by fatigue Patient left: in chair;with call bell/phone within reach;with chair alarm set;with family/visitor present Nurse Communication: Mobility status PT Visit Diagnosis: Unsteadiness on feet (R26.81);Muscle weakness (generalized) (M62.81);Other abnormalities of gait and mobility (R26.89);History of falling (Z91.81);Difficulty in walking, not elsewhere classified (R26.2);Dizziness and giddiness (R42)    Time: 1335-1406 PT Time Calculation (min) (ACUTE ONLY): 31 min   Charges:   PT Evaluation $PT Eval Low Complexity: 1 Low PT Treatments $Therapeutic Activity: 23-37 mins PT General Charges $$ ACUTE PT  VISIT: 1 Visit         Manson Luckadoo Sauvignon Howard SPT, LAT, ATC   Starlynn Klinkner Sauvignon-Howard 10/01/2023, 4:09 PM

## 2023-10-02 ENCOUNTER — Encounter: Payer: Self-pay | Admitting: Internal Medicine

## 2023-10-02 DIAGNOSIS — M8448XD Pathological fracture, other site, subsequent encounter for fracture with routine healing: Secondary | ICD-10-CM | POA: Diagnosis not present

## 2023-10-02 DIAGNOSIS — Z748 Other problems related to care provider dependency: Secondary | ICD-10-CM | POA: Diagnosis not present

## 2023-10-02 DIAGNOSIS — R296 Repeated falls: Secondary | ICD-10-CM | POA: Diagnosis not present

## 2023-10-02 DIAGNOSIS — E43 Unspecified severe protein-calorie malnutrition: Secondary | ICD-10-CM | POA: Diagnosis not present

## 2023-10-02 DIAGNOSIS — Z515 Encounter for palliative care: Secondary | ICD-10-CM | POA: Diagnosis not present

## 2023-10-02 DIAGNOSIS — R262 Difficulty in walking, not elsewhere classified: Secondary | ICD-10-CM

## 2023-10-02 LAB — CBC WITH DIFFERENTIAL/PLATELET
Abs Immature Granulocytes: 0.04 10*3/uL (ref 0.00–0.07)
Basophils Absolute: 0 10*3/uL (ref 0.0–0.1)
Basophils Relative: 0 %
Eosinophils Absolute: 0 10*3/uL (ref 0.0–0.5)
Eosinophils Relative: 0 %
HCT: 30.1 % — ABNORMAL LOW (ref 36.0–46.0)
Hemoglobin: 10.1 g/dL — ABNORMAL LOW (ref 12.0–15.0)
Immature Granulocytes: 1 %
Lymphocytes Relative: 27 %
Lymphs Abs: 2.1 10*3/uL (ref 0.7–4.0)
MCH: 29.4 pg (ref 26.0–34.0)
MCHC: 33.6 g/dL (ref 30.0–36.0)
MCV: 87.5 fL (ref 80.0–100.0)
Monocytes Absolute: 0.8 10*3/uL (ref 0.1–1.0)
Monocytes Relative: 10 %
Neutro Abs: 4.9 10*3/uL (ref 1.7–7.7)
Neutrophils Relative %: 62 %
Platelets: 258 10*3/uL (ref 150–400)
RBC: 3.44 MIL/uL — ABNORMAL LOW (ref 3.87–5.11)
RDW: 13.9 % (ref 11.5–15.5)
WBC: 7.9 10*3/uL (ref 4.0–10.5)
nRBC: 0 % (ref 0.0–0.2)

## 2023-10-02 LAB — BASIC METABOLIC PANEL
Anion gap: 10 (ref 5–15)
BUN: 25 mg/dL — ABNORMAL HIGH (ref 8–23)
CO2: 26 mmol/L (ref 22–32)
Calcium: 8.4 mg/dL — ABNORMAL LOW (ref 8.9–10.3)
Chloride: 97 mmol/L — ABNORMAL LOW (ref 98–111)
Creatinine, Ser: 1.42 mg/dL — ABNORMAL HIGH (ref 0.44–1.00)
GFR, Estimated: 33 mL/min — ABNORMAL LOW (ref >60)
Glucose, Bld: 92 mg/dL (ref 70–99)
Potassium: 3.6 mmol/L (ref 3.5–5.1)
Sodium: 133 mmol/L — ABNORMAL LOW (ref 135–145)

## 2023-10-02 NOTE — Progress Notes (Signed)
Daily Progress Note   Patient Name: Autumn Johnston       Date: 10/02/2023 DOB: 1922/05/21  Age: 89 y.o. MRN#: [id number] Attending Physician: Loyce Dys, MD Primary Care Physician: Marguarite Arbour, MD Admit Date: 09/30/2023  Reason for Consultation/Follow-up: Establishing goals of care  HPI/Brief Hospital Review: 89 y.o. female  with past medical history of hypertension, hyperlipidemia, CKD stage IIIb with anemia, colon cancers status post colostomy with large parastomal hernia admitted on 09/30/2023 from home with recurrent falls at home.   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 Status post intramedullary rodding of the left femur for old intertrochanteric fracture. No definite acute osseous abnormality 2. Old left inferior pubic ramus fracture 3. Heterogeneous progressive sclerosis in the left sacrum consistent with healing/subacute sacral insufficiency fracture   Admitted and being treated for left hip and thigh pain with ambulatory dysfunction, frailty and acute kidney injury with known history of hydronephrosis to right kidney with no obstructive calculus   Palliative medicine was consulted for assisting with goals of care conversations.   Subjective: Extensive chart review has been completed prior to meeting patient including labs, vital signs, imaging, progress notes, orders, and available advanced directive documents from current and previous encounters.    Visited with Ms. Gullick at her bedside. She is awake, alert, able to engage in conversation. She shares she had a restful evening and denies acute pain or discomfort.  Daughter-Janet at  bedside during time of visit. Marylu Lund in communication with her sister-[name] who I visited with yesterday. Plan remains for STR with likely transition to LTC, Palliative to follow during STR with likely transition to Hospice once established in LTC.  Answered and addressed all questions and concerns. Encouraged family to reach out to PMT if any needs or concerns arise, PMT to follow peripherally as goals are clear. TOC engaged in discharge planning.  Thank you for allowing the Palliative Medicine Team to assist in the care of this patient.  Total time: 25 minutes   Time spent includes: Detailed review of medical records (labs, imaging, vital signs), medically appropriate exam (mental status, respiratory, cardiac, skin), discussed with treatment team, counseling and educating patient, family and staff, documenting clinical information, medication  management and coordination of care.  Leeanne Deed, DNP, AGNP-C Palliative Medicine   Please contact Palliative Medicine Team phone at [phone] for questions and concerns.

## 2023-10-02 NOTE — TOC Progression Note (Addendum)
Transition of Care [facility]) - Progression Note    Patient Details  Name: Autumn Johnston MRN: [id number] Date of Birth: Feb 13, 1922  Transition of Care [facility]) CM/SW Contact  Kemper Durie, RN Phone Number: 10/02/2023, 11:30 AM  Clinical Narrative:     Called daughter to discuss discharge planning/SNF placement, no answer, voice message left.      Update @ 1400:  Daughter called back, agrees to SNF, also would like to look into long term placement once STR is complete.  Prefers patient to go to [facility] or [facility].  FL2 completed, bed search initiated.   Expected Discharge Plan and Services                                               Social Determinants of Health (SDOH) Interventions SDOH Screenings   Food Insecurity: Patient Unable To Answer (10/01/2023)  Housing: Patient Unable To Answer (10/01/2023)  Transportation Needs: Patient Unable To Answer (10/01/2023)  Utilities: Patient Unable To Answer (10/01/2023)  Tobacco Use: Low Risk  (10/02/2023)    Readmission Risk Interventions    03/31/2022   11:40 AM 09/17/2021    9:37 AM  Readmission Risk Prevention Plan  Transportation Screening Complete Complete  PCP or Specialist Appt within 3-5 Days Complete   HRI or Home Care Consult Complete   Social Work Consult for Recovery Care Planning/Counseling Complete   Palliative Care Screening Not Applicable   Medication Review Oceanographer) Complete Complete  PCP or Specialist appointment within 3-5 days of discharge  Complete  HRI or Home Care Consult  Complete  SW Recovery Care/Counseling Consult  Not Complete  SW Consult Not Complete Comments  RNCM assigned to case  Palliative Care Screening  Not Applicable  Comments  na  Skilled Nursing Facility  Complete

## 2023-10-02 NOTE — Progress Notes (Signed)
OT Cancellation Note  Patient Details Name: Autumn Johnston MRN: [id number] DOB: 06-Oct-1922   Cancelled Treatment:    Reason Eval/Treat Not Completed: Other (comment). Orders received, chart reviewed. Pt asleep in bed - per charge RN, pt has been confused and requiring extensive assist from nursing staff for redirection. OT will re-attempt as able and pt appropriate for OT services.   Darnita Woodrum L. Alejandra Hunt, OTR/L  10/02/23, 10:34 AM

## 2023-10-02 NOTE — Plan of Care (Signed)

## 2023-10-02 NOTE — Evaluation (Signed)
Occupational Therapy Evaluation Patient Details Name: Autumn Johnston MRN: [id number] DOB: [birth year] Today's Date: 10/02/2023   History of Present Illness Pt is a 89 y.o. female in from a recent fall. Sacral Insufficiency Fracture, Subacute were noted following the fall. Pt also has a large abdominal hernia at her LLQ. PmHx includes: Dementia, falls, HTN, and L pubic Ramus Fx.   Clinical Impression   Pt seen for OT eval, agreeable to session. Alert to self, birthday, and year. Pt overall poor historian, PLOF taken from chart review.  Pt lives alone, but has family members alternating to take care of her. She has a history of frequent falls. Pt limited by generalized weakness, and unsteadiness with trembling in standing. Requires assistance for ADLs at baseline (unsure exactly how much, as pt reports IND yet chart review indicates otherwise). Was able to peform self care tasks with setup, did not require physical or verbal cues from seated EOB position. Stands x 3, cues for technique and CGA, laterally steps towards HOB x 5 using RW and CGA. Presents with deficits in cognition, strength, balance and generalized weakness which increase her risk of falls and overall limit efficient, safe ADL performance. Pt would benefit from skilled OT services to address noted impairments and functional limitations (see below for any additional details) in order to maximize safety and independence while minimizing falls risk and caregiver burden. Pt will need increased level of supervision/assist at hospital discharge. Anticipate the need for follow up OT services upon acute hospital DC.       If plan is discharge home, recommend the following: A lot of help with walking and/or transfers;A lot of help with bathing/dressing/bathroom;Assistance with feeding;Assistance with cooking/housework;Direct supervision/assist for medications management;Direct supervision/assist for financial management;Assist for  transportation;Supervision due to cognitive status;Help with stairs or ramp for entrance    Functional Status Assessment  Patient has had a recent decline in their functional status and demonstrates the ability to make significant improvements in function in a reasonable and predictable amount of time.  Equipment Recommendations  None recommended by OT    Recommendations for Other Services Other (comment)     Precautions / Restrictions Precautions Precautions: Fall Restrictions Weight Bearing Restrictions: No      Mobility Bed Mobility Overal bed mobility: Needs Assistance Bed Mobility: Supine to Sit, Sit to Supine     Supine to sit: Min assist, Used rails Sit to supine: Mod assist, HOB elevated, Used rails   General bed mobility comments: mod A for BLE and trunk mgmt returning to supine    Transfers Overall transfer level: Needs assistance Equipment used: Rolling walker (2 wheels) Transfers: Sit to/from Stand Sit to Stand: Contact guard assist           General transfer comment: 4x STS using RW and CGA. Cues for hand placement.      Balance Overall balance assessment: Needs assistance Sitting-balance support: Feet supported, Single extremity supported Sitting balance-Leahy Scale: Fair Sitting balance - Comments: grooming tasks with setp sitting EOB   Standing balance support: Bilateral upper extremity supported, Reliant on assistive device for balance Standing balance-Leahy Scale: Fair Standing balance comment: generally unsteady and trembling with effort                           ADL either performed or assessed with clinical judgement   ADL Overall ADL's : Needs assistance/impaired     Grooming: Wash/dry hands;Wash/dry face;Brushing hair;Set up;Sitting  Toilet Transfer: Contact guard assist;Ambulation;BSC/3in1;Rolling walker (2 wheels) Toilet Transfer Details (indicate cue type and reason): simulated          Functional mobility during ADLs: Contact guard assist;Rolling walker (2 wheels);Cueing for sequencing;Cueing for safety General ADL Comments: Pt limited by generalized weakness, and unsteadiness with trembling in standing. Requires assistance for ADLs at baseline (unsure exactly how much, as pt reports IND yet chart review indicates otherwise). Was able to peform self care tasks with setup, did not require physical or verbal cues from seated EOB position. Stands x 3, cues for technique and CGA, laterally steps towards HOB x 5 using RW and CGA.     Vision Baseline Vision/History: 0 No visual deficits Ability to See in Adequate Light: 0 Adequate Patient Visual Report: No change from baseline (reads clock WNL)              Pertinent Vitals/Pain Pain Assessment Pain Assessment: No/denies pain     Extremity/Trunk Assessment Upper Extremity Assessment Upper Extremity Assessment: Generalized weakness (trembling UE in standing)   Lower Extremity Assessment Lower Extremity Assessment: Generalized weakness (trembles with standing)       Communication Communication Communication: Hearing impairment Cueing Techniques: Verbal cues;Gestural cues;Tactile cues   Cognition Arousal: Alert Behavior During Therapy: WFL for tasks assessed/performed Overall Cognitive Status: History of cognitive impairments - at baseline                                 General Comments: Follows commands, oriented to self, year and birthday     General Comments  Colostomy bag intact            Home Living Family/patient expects to be discharged to:: Private residence Living Arrangements: Alone;Children Available Help at Discharge: Friend(s);Available PRN/intermittently Type of Home: House Home Access: Stairs to enter [employer] of Steps: 3 Entrance Stairs-Rails: Can reach both Home Layout: One level     Bathroom Shower/Tub: Tub/shower unit;Walk-in shower         Home  Equipment: Agricultural consultant (2 wheels);BSC/3in1   Additional Comments: Pt lives alone, however children alternate staying with her to help. Children have concerns regarding the physical limits they are starting to have when it comes to taking care of the pt.      Prior Functioning/Environment Prior Level of Function : Needs assist  Cognitive Assist : ADLs (cognitive);Mobility (cognitive)     Physical Assist : Mobility (physical);ADLs (physical) Mobility (physical): Transfers;Gait;Stairs ADLs (physical): Bathing;Dressing;Toileting;IADLs Mobility Comments: limited household ambulator, children there with supervision ADLs Comments: children assist with IADL's        OT Problem List: Decreased strength;Decreased range of motion;Decreased activity tolerance;Impaired balance (sitting and/or standing);Decreased knowledge of use of DME or AE      OT Treatment/Interventions:      OT Goals(Current goals can be found in the care plan section) Acute Rehab OT Goals OT Goal Formulation: With patient Time For Goal Achievement: 10/16/23 Potential to Achieve Goals: Fair  OT Frequency: Min 1X/week       AM-PAC OT "6 Clicks" Daily Activity     Outcome Measure Help from another person eating meals?: A Little Help from another person taking care of personal grooming?: A Little Help from another person toileting, which includes using toliet, bedpan, or urinal?: A Lot Help from another person bathing (including washing, rinsing, drying)?: A Lot Help from another person to put on and taking off regular upper body clothing?: A  Little Help from another person to put on and taking off regular lower body clothing?: A Lot 6 Click Score: 15   End of Session Equipment Utilized During Treatment: Rolling walker (2 wheels) Nurse Communication: Mobility status  Activity Tolerance: Patient tolerated treatment well Patient left: in bed;with call bell/phone within reach;with bed alarm set (in bed for safety due  to confusion)  OT Visit Diagnosis: Unsteadiness on feet (R26.81);Repeated falls (R29.6);Muscle weakness (generalized) (M62.81)                Time: 2952-8413 OT Time Calculation (min): 12 min Charges:  OT General Charges $OT Visit: 1 Visit OT Evaluation $OT Eval Low Complexity: 1 Low  Viviene Thurston L. Shawna Wearing, OTR/L  10/02/23, 1:54 PM

## 2023-10-02 NOTE — Progress Notes (Signed)
Progress Note   Patient: Autumn Johnston HQI:[id number] DOB: Oct 16, 1922 DOA: 09/30/2023     1 DOS: the patient was seen and examined on 10/02/2023       Subjective:  Patient seen and examined at bedside this morning PT OT has seen patient and has recommended skilled nursing facility placement Transition of care manager working on this Patient denied nausea vomiting abdominal pain or chest pain   Brief hospital course: From HPI "Autumn Johnston is a 89 y.o. 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 The history is taken from her daughter who said that since her discharge on 08/12/23 she has fallen at least five times when trying to get out of bed. Says she does not sleep and tries to get out the bed. On previous occasions she has been helped up and able to ambulate but this time she was unable to.  Patient lives alone since her husband died in 2023/04/03 and three children take turns spending the day and night with her. She also says that she is declining mentally and saying things that don't make sense. It is getting increasingly difficult to care for her mother at home as all children are in their seventies. ED course and data review: BP elevated at 188/83 with otherwise normal vitals EKG NSR at 87 with nonspecific ST-T wave changes Labs: CBC and BMP done and notable for creatinine 1.3 up from baseline of 0.88 given concerns of sacral fracture hospitalist service  was therefore contacted to admit patient for further management       Assessment and Plan: Sacral insufficiency fracture, subacute Left hip and thigh pain with ambulatory dysfunction secondary to fall Frequent falls x 1 month CT left hip shows findings consistent with healing/subacute sacral insufficiency fracture Continue current pain management We will continue PT OT TOC manager on board and case discussed   Protein calorie malnutrition, unspecified (HCC) Frailty and history of falls Advanced age Caregiver unable to cope Dietary, TOC and PT consult Palliative care on board Patient being planned for skilled nursing facility as recommended by PT OT   AKI (acute kidney injury) (HCC) History of hydronephrosis right kidney 07/2023 with no obstructive calculus Creatinine 1.30 up from baseline of 0.88 Urinalysis unremarkable Monitor renal function closely We will encourage oral hydration   Hypertensive urgency-improved Continue amlodipine   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  Constitutional: Patient seen in no acute distress HENT:     Head: Normocephalic and atraumatic.  Cardiovascular:     Rate and Rhythm: Normal rate and regular  rhythm.     Heart sounds: Normal heart sounds.  Pulmonary:     Effort: Pulmonary effort is normal.     Breath sounds: Normal breath sounds.  Abdominal:     Palpations: Abdomen is soft.     Comments: Large parastomal hernia, some voluntary  guarding but nontender   Neurological:     Mental Status: Mental status is at baseline.      Data Reviewed: I have reviewed patient's labs vitals as listed below as well as PT OT documentation and TOC manager documentation  Time spent: 45 minutes      Latest Ref Rng & Units 10/02/2023    4:11 AM 10/01/2023    4:38 AM 10/01/2023   12:28 AM  CBC  WBC 4.0 - 10.5 K/uL 7.9  7.1  7.7   Hemoglobin 12.0 - 15.0 g/dL 78.2  95.6  21.3   Hematocrit 36.0 - 46.0 % 30.1  34.4  35.0   Platelets 150 - 400 K/uL 258  281  278        Latest Ref Rng & Units 10/02/2023    4:11 AM 10/01/2023    4:38 AM 10/01/2023   12:28 AM  BMP  Glucose 70 - 99 mg/dL 92  90  98   BUN 8 - 23 mg/dL 25  16  17    Creatinine 0.44 - 1.00 mg/dL 0.86  5.78  4.69   Sodium 135 - 145 mmol/L 133  136  137   Potassium 3.5 - 5.1 mmol/L 3.6  3.5  3.5   Chloride 98 - 111 mmol/L 97  99  98   CO2 22 - 32 mmol/L 26  25  24    Calcium 8.9 - 10.3 mg/dL 8.4  8.6  8.8      Vitals:   10/01/23 1957 10/02/23 0405 10/02/23 0500 10/02/23 0746  BP: (!) 110/53 (!) 145/83  (!) 154/76  Pulse: 79 87  83  Resp: 18 18  20   Temp: 98.5 F (36.9 C) 97.7 F (36.5 C)  97.8 F (36.6 C)  TempSrc: Oral Oral  Oral  SpO2: 96% 98%  98%  Weight:   43.7 kg   Height:        Author: Loyce Dys, MD 10/02/2023 2:03 PM  For on call review [email or web address].

## 2023-10-02 NOTE — NC FL2 (Signed)
[place] MEDICAID FL2 LEVEL OF CARE FORM     IDENTIFICATION  Patient Name: Autumn Johnston Birthdate: October 18, 1922 Sex: female Admission Date (Current Location): 09/30/2023  [facility] and [place] Number:  Chiropodist and Address:  [facility], [address] [place], [place] [address]      Provider Number: [id number]  Attending Physician Name and Address:  Loyce Dys, MD  Relative Name and Phone Number:  Riley Kill (Daughter)  [phone]    Current Level of Care: Hospital Recommended Level of Care: Skilled Nursing Facility Prior Approval Number:    Date Approved/Denied:   PASRR Number: [id number]  Discharge Plan: SNF    Current Diagnoses: Patient Active Problem List   Diagnosis Date Noted   Ambulatory dysfunction 10/01/2023   Sacral insufficiency fracture, subacute 10/01/2023   Caregiver unable to cope 10/01/2023   AKI (acute kidney injury) (HCC) 10/01/2023   Dementia (HCC)    Parastomal hernia    Frailty 08/06/2023   Closed compression fracture of L4 vertebra (HCC) 08/06/2023   Hydronephrosis of right kidney 08/06/2023   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) 08/06/2023   CAP (community acquired pneumonia) 03/31/2022   Pressure injury of skin 09/12/2021   Acute blood loss anemia    COVID-19 virus infection    Left lower quadrant abdominal mass    Hypotension    Pre-op evaluation    Stage 3b chronic kidney disease (HCC)    Closed hip fracture requiring operative repair, left, sequela 09/07/2021   Fall at home, initial encounter 09/07/2021   Protein-calorie malnutrition, severe 07/31/2021   Nonsustained ventricular tachycardia (HCC) 07/30/2021   Hyponatremia 07/29/2021   Demand ischemia (HCC)    Hypertensive urgency 07/28/2021   Protein calorie malnutrition, unspecified (HCC) 07/28/2021   Essential hypertension 07/27/2021   Hyperlipidemia 07/27/2021   Pernicious anemia 07/27/2021   Renal failure  (ARF), acute on chronic (HCC) 07/27/2021   Acute hyponatremia 07/27/2021   Elevated troponin 07/27/2021   Acute metabolic encephalopathy 07/27/2021   Chronic insomnia 04/05/2017   Chronic kidney disease (CKD), stage IV (severe) (HCC) 12/25/2015   Anemia of chronic disease 07/20/2014    Orientation RESPIRATION BLADDER Height & Weight     Self, Place  Normal Continent Weight: 43.7 kg Height:  5\' 2"  (157.5 cm)  BEHAVIORAL SYMPTOMS/MOOD NEUROLOGICAL BOWEL NUTRITION STATUS      Colostomy Diet  AMBULATORY STATUS COMMUNICATION OF NEEDS Skin   Extensive Assist Verbally Skin abrasions                       Personal Care Assistance Level of Assistance  Bathing, Dressing Bathing Assistance: Limited assistance   Dressing Assistance: Limited assistance     Functional Limitations Info             SPECIAL CARE FACTORS FREQUENCY  PT (By licensed PT), OT (By licensed OT)     PT Frequency: 5 times a week OT Frequency: 5 times a week            Contractures Contractures Info: Not present    Additional Factors Info  Code Status, Allergies Code Status Info: DNR Allergies Info: NKA           Current Medications (10/02/2023):  This is the current hospital active medication list Current Facility-Administered Medications  Medication Dose Route Frequency Provider Last Rate Last Admin   acetaminophen (TYLENOL) tablet 650 mg  650 mg Oral Q6H PRN Andris Baumann, MD  Or   acetaminophen (TYLENOL) suppository 650 mg  650 mg Rectal Q6H PRN Andris Baumann, MD       ALPRAZolam Prudy Feeler) tablet 0.25 mg  0.25 mg Oral BID PRN Lindajo Royal V, MD   0.25 mg at 10/02/23 1026   amLODipine (NORVASC) tablet 10 mg  10 mg Oral Daily Lindajo Royal V, MD   10 mg at 10/02/23 1026   enoxaparin (LOVENOX) injection 30 mg  30 mg Subcutaneous Q24H Lindajo Royal V, MD   30 mg at 10/02/23 1026   feeding supplement (ENSURE ENLIVE / ENSURE PLUS) liquid 237 mL  237 mL Oral BID BM Rosezetta Schlatter T, MD    237 mL at 10/02/23 1026   fesoterodine (TOVIAZ) tablet 4 mg  4 mg Oral Daily Lindajo Royal V, MD   4 mg at 10/02/23 1026   HYDROcodone-acetaminophen (NORCO/VICODIN) 5-325 MG per tablet 1-2 tablet  1-2 tablet Oral Q4H PRN Andris Baumann, MD   1 tablet at 10/02/23 0401   isosorbide mononitrate (IMDUR) 24 hr tablet 15 mg  15 mg Oral Daily Lindajo Royal V, MD   15 mg at 10/02/23 1025   morphine (PF) 2 MG/ML injection 2 mg  2 mg Intravenous Q2H PRN Andris Baumann, MD       multivitamin with minerals tablet 1 tablet  1 tablet Oral Daily Rosezetta Schlatter T, MD   1 tablet at 10/02/23 1026   ondansetron (ZOFRAN) tablet 4 mg  4 mg Oral Q6H PRN Andris Baumann, MD       Or   ondansetron [facility]) injection 4 mg  4 mg Intravenous Q6H PRN Andris Baumann, MD       polyethylene glycol (MIRALAX / GLYCOLAX) packet 17 g  17 g Oral Daily PRN Andris Baumann, MD       simvastatin (ZOCOR) tablet 20 mg  20 mg Oral QHS Andris Baumann, MD   20 mg at 10/01/23 2322     Discharge Medications: Please see discharge summary for a list of discharge medications.  Relevant Imaging Results:  Relevant Lab Results:   Additional Information [id number]  [place], [place]

## 2023-10-03 DIAGNOSIS — R296 Repeated falls: Secondary | ICD-10-CM | POA: Diagnosis not present

## 2023-10-03 DIAGNOSIS — S3210XA Unspecified fracture of sacrum, initial encounter for closed fracture: Secondary | ICD-10-CM | POA: Diagnosis not present

## 2023-10-03 LAB — CBC WITH DIFFERENTIAL/PLATELET
Abs Immature Granulocytes: 0.02 10*3/uL (ref 0.00–0.07)
Basophils Absolute: 0 10*3/uL (ref 0.0–0.1)
Basophils Relative: 0 %
Eosinophils Absolute: 0 10*3/uL (ref 0.0–0.5)
Eosinophils Relative: 0 %
HCT: 32.3 % — ABNORMAL LOW (ref 36.0–46.0)
Hemoglobin: 10.8 g/dL — ABNORMAL LOW (ref 12.0–15.0)
Immature Granulocytes: 0 %
Lymphocytes Relative: 25 %
Lymphs Abs: 1.9 10*3/uL (ref 0.7–4.0)
MCH: 29.2 pg (ref 26.0–34.0)
MCHC: 33.4 g/dL (ref 30.0–36.0)
MCV: 87.3 fL (ref 80.0–100.0)
Monocytes Absolute: 0.8 10*3/uL (ref 0.1–1.0)
Monocytes Relative: 11 %
Neutro Abs: 5 10*3/uL (ref 1.7–7.7)
Neutrophils Relative %: 64 %
Platelets: 268 10*3/uL (ref 150–400)
RBC: 3.7 MIL/uL — ABNORMAL LOW (ref 3.87–5.11)
RDW: 13.6 % (ref 11.5–15.5)
WBC: 7.8 10*3/uL (ref 4.0–10.5)
nRBC: 0 % (ref 0.0–0.2)

## 2023-10-03 LAB — BASIC METABOLIC PANEL
Anion gap: 13 (ref 5–15)
BUN: 28 mg/dL — ABNORMAL HIGH (ref 8–23)
CO2: 24 mmol/L (ref 22–32)
Calcium: 8.8 mg/dL — ABNORMAL LOW (ref 8.9–10.3)
Chloride: 96 mmol/L — ABNORMAL LOW (ref 98–111)
Creatinine, Ser: 1.2 mg/dL — ABNORMAL HIGH (ref 0.44–1.00)
GFR, Estimated: 41 mL/min — ABNORMAL LOW (ref >60)
Glucose, Bld: 89 mg/dL (ref 70–99)
Potassium: 3.6 mmol/L (ref 3.5–5.1)
Sodium: 133 mmol/L — ABNORMAL LOW (ref 135–145)

## 2023-10-03 MED ORDER — INFLUENZA VAC A&B SURF ANT ADJ 0.5 ML IM SUSY
0.5000 mL | PREFILLED_SYRINGE | INTRAMUSCULAR | Status: DC
  Filled 2023-10-03: qty 0.5

## 2023-10-03 MED ORDER — PNEUMOCOCCAL 20-VAL CONJ VACC 0.5 ML IM SUSY: 0.5000 mL | PREFILLED_SYRINGE | INTRAMUSCULAR | Status: DC

## 2023-10-03 NOTE — Plan of Care (Signed)

## 2023-10-03 NOTE — Progress Notes (Signed)
Progress Note   Patient: Autumn Johnston GLO:[id number] DOB: 17-Nov-1922 DOA: 09/30/2023     2 DOS: the patient was seen and examined on 10/03/2023      Subjective:  Patient seen and examined at bedside this morning Denies worsening back pain nausea vomiting no abdominal pain   Brief hospital course: From HPI "Autumn Johnston is a 89 y.o. 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 The history is taken from her daughter who said that since her discharge on 08/12/23 she has fallen at least five times when trying to get out of bed. Says she does not sleep and tries to get out the bed. On previous occasions she has been helped up and able to ambulate but this time she was unable to.  Patient lives alone since her husband died in 03/28/23 and three children take turns spending the day and night with her. She also says that she is declining mentally and saying things that don't make sense. It is getting increasingly difficult to care for her mother at home as all children are in their seventies. ED course and data review: BP elevated at 188/83 with otherwise normal vitals EKG NSR at 87 with nonspecific ST-T wave changes Labs: CBC and BMP done and notable for creatinine 1.3 up from baseline of 0.88 given concerns of sacral fracture hospitalist service was therefore contacted to admit patient for further management       Assessment and Plan: Sacral insufficiency  fracture, subacute Left hip and thigh pain with ambulatory dysfunction secondary to fall Frequent falls x 1 month CT left hip shows findings consistent with healing/subacute sacral insufficiency fracture Continue current pain management Continue PT OT TOC manager on board and working on placement   Protein calorie malnutrition, unspecified (HCC) Frailty and history of falls Advanced age Caregiver unable to cope Dietary, TOC and PT consult Palliative care on board Patient being planned for skilled nursing facility as recommended by PT OT   AKI (acute kidney injury) (HCC) History of hydronephrosis right kidney 07/2023 with no obstructive calculus Creatinine 1.30 up from baseline of 0.88 Urinalysis unremarkable Monitor renal function Continue to encourage oral hydration   Hypertensive urgency-improved Continue amlodipine   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  Constitutional: Patient seen in no acute distress HENT:     Head: Normocephalic and atraumatic.  Cardiovascular:     Rate and Rhythm: Normal rate and regular rhythm.     Heart sounds: Normal heart sounds.  Pulmonary:     Effort: Pulmonary effort is normal.  Breath sounds: Normal breath sounds.  Abdominal:     Palpations: Abdomen is soft.     Comments: Large parastomal hernia, some voluntary guarding but nontender   Neurological:     Mental Status: Mental status is at baseline.      Data Reviewed: I have  reviewed the patient's vitals below, nursing documentation, [facility] manager documentation and PT OT notes  Time spent: 40 minutes     Vitals:   10/02/23 1923 10/03/23 0350 10/03/23 0710 10/03/23 0809  BP: 122/65 126/61  (!) 141/69  Pulse: 78 80  81  Resp: 16 16  18   Temp: 98 F (36.7 C) 98 F (36.7 C)  97.9 F (36.6 C)  TempSrc: Oral Oral    SpO2: 99% 98%  99%  Weight:   43.7 kg   Height:         Author: Loyce Dys, MD 10/03/2023 5:13 PM  For on call review [email or web address].

## 2023-10-04 DIAGNOSIS — S3210XD Unspecified fracture of sacrum, subsequent encounter for fracture with routine healing: Secondary | ICD-10-CM | POA: Diagnosis not present

## 2023-10-04 DIAGNOSIS — R296 Repeated falls: Secondary | ICD-10-CM | POA: Diagnosis not present

## 2023-10-04 LAB — CBC WITH DIFFERENTIAL/PLATELET
Abs Immature Granulocytes: 0.03 10*3/uL (ref 0.00–0.07)
Basophils Absolute: 0 10*3/uL (ref 0.0–0.1)
Basophils Relative: 0 %
Eosinophils Absolute: 0 10*3/uL (ref 0.0–0.5)
Eosinophils Relative: 0 %
HCT: 33.1 % — ABNORMAL LOW (ref 36.0–46.0)
Hemoglobin: 10.8 g/dL — ABNORMAL LOW (ref 12.0–15.0)
Immature Granulocytes: 0 %
Lymphocytes Relative: 15 %
Lymphs Abs: 1.4 10*3/uL (ref 0.7–4.0)
MCH: 29 pg (ref 26.0–34.0)
MCHC: 32.6 g/dL (ref 30.0–36.0)
MCV: 88.7 fL (ref 80.0–100.0)
Monocytes Absolute: 0.9 10*3/uL (ref 0.1–1.0)
Monocytes Relative: 10 %
Neutro Abs: 6.5 10*3/uL (ref 1.7–7.7)
Neutrophils Relative %: 75 %
Platelets: 271 10*3/uL (ref 150–400)
RBC: 3.73 MIL/uL — ABNORMAL LOW (ref 3.87–5.11)
RDW: 13.7 % (ref 11.5–15.5)
WBC: 8.8 10*3/uL (ref 4.0–10.5)
nRBC: 0 % (ref 0.0–0.2)

## 2023-10-04 LAB — BASIC METABOLIC PANEL
Anion gap: 11 (ref 5–15)
BUN: 29 mg/dL — ABNORMAL HIGH (ref 8–23)
CO2: 27 mmol/L (ref 22–32)
Calcium: 8.9 mg/dL (ref 8.9–10.3)
Chloride: 97 mmol/L — ABNORMAL LOW (ref 98–111)
Creatinine, Ser: 1.13 mg/dL — ABNORMAL HIGH (ref 0.44–1.00)
GFR, Estimated: 44 mL/min — ABNORMAL LOW (ref >60)
Glucose, Bld: 76 mg/dL (ref 70–99)
Potassium: 3.9 mmol/L (ref 3.5–5.1)
Sodium: 135 mmol/L (ref 135–145)

## 2023-10-04 MED ORDER — HYDROCODONE-ACETAMINOPHEN 5-325 MG PO TABS: 1.0000 | ORAL_TABLET | ORAL | 0 refills | Status: DC | PRN

## 2023-10-04 MED ORDER — AMLODIPINE BESYLATE 10 MG PO TABS: 10.0000 mg | ORAL_TABLET | Freq: Every day | ORAL | Status: DC

## 2023-10-04 MED ORDER — ALPRAZOLAM 0.25 MG PO TABS: 0.2500 mg | ORAL_TABLET | Freq: Two times a day (BID) | ORAL | 0 refills | Status: DC | PRN

## 2023-10-04 MED ORDER — ISOSORBIDE MONONITRATE ER 30 MG PO TB24: 15.0000 mg | ORAL_TABLET | Freq: Every day | ORAL | Status: DC

## 2023-10-04 NOTE — Progress Notes (Signed)
EMS arrived to transport patient.  Autumn Johnston

## 2023-10-04 NOTE — Plan of Care (Signed)
Adequate for transfer to SNF

## 2023-10-04 NOTE — TOC Progression Note (Addendum)
Transition of Care [facility]) - Progression Note    Patient Details  Name: Autumn Johnston MRN: [id number] Date of Birth: 11-16-1922  Transition of Care [facility]) CM/SW Contact  Chapman Fitch, RN Phone Number: 10/04/2023, 10:06 AM  Clinical Narrative:     Voicemail left for daughter Tinnie Gens.  Awaiting return call to present bed offer       Expected Discharge Plan and Services                                               Social Determinants of Health (SDOH) Interventions SDOH Screenings   Food Insecurity: Patient Unable To Answer (10/01/2023)  Housing: Patient Unable To Answer (10/01/2023)  Transportation Needs: Patient Unable To Answer (10/01/2023)  Utilities: Patient Unable To Answer (10/01/2023)  Tobacco Use: Low Risk  (10/02/2023)    Readmission Risk Interventions    03/31/2022   11:40 AM 09/17/2021    9:37 AM  Readmission Risk Prevention Plan  Transportation Screening Complete Complete  PCP or Specialist Appt within 3-5 Days Complete   HRI or Home Care Consult Complete   Social Work Consult for Recovery Care Planning/Counseling Complete   Palliative Care Screening Not Applicable   Medication Review Oceanographer) Complete Complete  PCP or Specialist appointment within 3-5 days of discharge  Complete  HRI or Home Care Consult  Complete  SW Recovery Care/Counseling Consult  Not Complete  SW Consult Not Complete Comments  RNCM assigned to case  Palliative Care Screening  Not Applicable  Comments  na  Skilled Nursing Facility  Complete

## 2023-10-04 NOTE — Care Management Important Message (Signed)
Important Message  Patient Details  Name: Keyana Guevara MRN: [id number] Date of Birth: [birth year]   Important Message Given:  Yes - Medicare IM  I reviewed the Important Message from Medicare with the daughter, Riley Kill by phone [phone]) and obtained a verbal consent. I will send to Health Information Management to be scanned into the patient's medical record. She is agreement with her discharge and I thanked her for her time.   Olegario Messier A Kamori Kitchens 10/04/2023, 2:17 PM

## 2023-10-04 NOTE — Progress Notes (Signed)
Report called  

## 2023-10-04 NOTE — Progress Notes (Signed)
[facility] Liaison Note:   (new referral for outpatient palliative services) Notified by [facility], Bevelyn Ngo, RN,  of patient/family request for [facility] Palliative Care services at [facility].   Referral submitted today.    Please call with any hospice or outpatient palliative care related questions.  Thank you for the opportunity to participate in this patient's care.  Redge Gainer, [facility] Liaison [phone]

## 2023-10-04 NOTE — TOC Transition Note (Addendum)
Transition of Care [facility]) - CM/SW Discharge Note   Patient Details  Name: Autumn Johnston MRN: [id number] Date of Birth: 1922/03/19  [facility]) CM/SW Contact:  Chapman Fitch, RN Phone Number: 10/04/2023, 2:37 PM   Clinical Narrative:    Daughter Tinnie Gens accepts bed at [facility]  Outpatient palliative referral made to [facility] with [facility]  Patient will DC to: [facility]  Anticipated DC date: 10/04/23  Family notified: Tinnie Gens  Transport by: Wendie Simmer  Per MD patient ready for DC to . RN, patient's family, and facility notified of DC. Discharge Summary sent to facility. RN given number for report. DC packet on chart. Ambulance transport requested for patient.  TOC signing off.        Patient Goals and CMS Choice      Discharge Placement                         Discharge Plan and Services Additional resources added to the After Visit Summary for                                       Social Determinants of Health (SDOH) Interventions SDOH Screenings   Food Insecurity: Patient Unable To Answer (10/01/2023)  Housing: Patient Unable To Answer (10/01/2023)  Transportation Needs: Patient Unable To Answer (10/01/2023)  Utilities: Patient Unable To Answer (10/01/2023)  Tobacco Use: Low Risk  (10/02/2023)     Readmission Risk Interventions    03/31/2022   11:40 AM 09/17/2021    9:37 AM  Readmission Risk Prevention Plan  Transportation Screening Complete Complete  PCP or Specialist Appt within 3-5 Days Complete   HRI or Home Care Consult Complete   Social Work Consult for Recovery Care Planning/Counseling Complete   Palliative Care Screening Not Applicable   Medication Review Oceanographer) Complete Complete  PCP or Specialist appointment within 3-5 days of discharge  Complete  HRI or Home Care Consult  Complete  SW Recovery Care/Counseling Consult  Not Complete  SW Consult Not Complete Comments  RNCM assigned to case   Palliative Care Screening  Not Applicable  Comments  na  Skilled Nursing Facility  Complete

## 2023-10-04 NOTE — Progress Notes (Signed)
Patient is groaning and begging/praying for God to "take me, take me please" Not tolerating the po meds very well, only po meds administered were pain and anxiety meds for patients comfort. Will continue to monitor.  Cornell Barman Chrishana Spargur

## 2023-10-04 NOTE — Discharge Summary (Signed)
Physician Discharge Summary   Patient: Autumn Johnston MRN: [id number] DOB: 1922-02-11  Admit date:     09/30/2023  Discharge date: 10/04/23  Discharge Physician: Loyce Dys   PCP: Marguarite Arbour, MD   Recommendations at discharge:  Follow-up with PCP  Discharge Diagnoses: Sacral insufficiency fracture, subacute Left hip and thigh pain with ambulatory dysfunction secondary to fall Frequent falls x 1 month Protein calorie malnutrition, unspecified (HCC) Frailty and history of falls Advanced age Caregiver unable to cope AKI (acute kidney injury) (HCC) History of hydronephrosis right kidney 07/2023 with no obstructive calculus Hypertensive urgency-improved 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 The history is taken from her daughter who said that since her discharge on 08/12/23 she has fallen at least five times when trying to get out of bed. Says she does not sleep and tries to get out the bed. On previous occasions she has been helped up and able to ambulate but this time she was unable to.  Patient lives alone since her husband died in March 22, 2023 and three children take turns spending the day and night with her. She also says that she is declining mentally and saying things that don't make sense. It is  getting increasingly difficult to care for her mother at home as all children are in their seventies. ED course and data review: CT left hip shows findings consistent with healing/subacute sacral insufficiency fracture.  Given patient's age they are not willing to pursue any surgical intervention and therefore being managed with pain management.  Pain is now better controlled and therefore patient is  being discharged to facility.      Consultants: None Procedures performed: None Disposition: Rehab Diet recommendation:  Cardiac diet DISCHARGE MEDICATION: Allergies as of 10/04/2023   No Known Allergies      Medication List     STOP taking these medications    cefUROXime 250 MG tablet Commonly known as: CEFTIN   haloperidol 0.5 MG tablet Commonly known as: HALDOL   sertraline 50 MG tablet Commonly known as: ZOLOFT   simvastatin 20 MG tablet Commonly known as: ZOCOR   zolpidem 10 MG tablet Commonly known as: AMBIEN       TAKE these medications    ALPRAZolam 0.25 MG tablet Commonly known as: XANAX Take 1 tablet (0.25 mg total) by mouth 2 (two) times daily as needed.   amLODipine 10 MG tablet Commonly known as: NORVASC Take 1 tablet (10 mg total) by mouth daily.   HYDROcodone-acetaminophen 5-325 MG tablet Commonly known as: NORCO/VICODIN Take 1-2 tablets by mouth every 4 (four) hours as needed for moderate pain (pain score 4-6).   isosorbide mononitrate 30 MG 24 hr tablet Commonly known as: IMDUR Take 0.5 tablets (15 mg total) by mouth daily.   ondansetron 4 MG disintegrating tablet Commonly known as: ZOFRAN-ODT Take 1 tablet (4 mg total) by mouth every 8 (eight) hours as needed  for nausea or vomiting.   polyethylene glycol 17 g packet Commonly known as: MIRALAX / GLYCOLAX Take 17 g by mouth daily as needed for mild constipation.   solifenacin 10 MG tablet Commonly known as: VESICARE Take 10 mg by mouth daily.        Contact information for  after-discharge care     Destination     [facility] .   Service: Skilled Nursing Contact information: [address] [place] [address] [phone]                    Discharge Exam: Autumn Johnston Weights   09/30/23 1445 10/02/23 0500 10/03/23 0710  Weight: 44.3 kg 43.7 kg 43.7 kg   Vitals and nursing note reviewed.  Constitutional: Patient seen in no acute distress HENT:     Head: Normocephalic and atraumatic.  Cardiovascular:     Rate and Rhythm: Normal rate and regular rhythm.     Heart sounds: Normal heart sounds.  Pulmonary:     Effort: Pulmonary effort is normal.     Breath sounds: Normal breath sounds.  Abdominal:     Palpations: Abdomen is soft.     Comments: Large parastomal hernia, some voluntary guarding but nontender   Neurological:     Mental Status: Mental status is at baseline.   Condition at discharge: good  The results of significant diagnostics from this hospitalization (including imaging, microbiology, ancillary and laboratory) are listed below for reference.   Imaging Studies: CT Head Wo Contrast  Result Date: 10/01/2023 CLINICAL DATA:  Head trauma, minor (Age >= 65y) unwitnessed fall last night, found at 0200 in floor. Denies blood thinners. C/o left upper leg pain. Ems reports confusion at baseline. EXAM: CT HEAD WITHOUT CONTRAST TECHNIQUE: Contiguous axial images were obtained from the base of the skull through the vertex without intravenous contrast. RADIATION DOSE REDUCTION: This exam was performed according to the departmental dose-optimization program which includes automated exposure control, adjustment of the mA and/or kV according to patient size and/or use of iterative reconstruction technique. COMPARISON:  CT head 07/27/2021 FINDINGS: Brain: Cerebral ventricle sizes are concordant with the degree of cerebral volume loss. Patchy and confluent areas of decreased attenuation are noted throughout the deep and  periventricular white matter of the cerebral hemispheres bilaterally, compatible with chronic microvascular ischemic disease. No evidence of large-territorial acute infarction. No parenchymal hemorrhage. No mass lesion. No extra-axial collection. No mass effect or midline shift. No hydrocephalus. Basilar cisterns are patent. Vascular: No hyperdense vessel. Atherosclerotic calcifications are present within the cavernous internal carotid and vertebral arteries. Skull: No acute fracture or focal lesion. Temporomandibular joint degenerative changes. Sinuses/Orbits: Trace left mastoid air cell fluid. Paranasal sinuses and right mastoid air cells are clear. Bilateral lens replacement. Otherwise the orbits are unremarkable. Other: None. IMPRESSION: No acute intracranial abnormality. Electronically Signed   By: Tish Frederickson M.D.   On: 10/01/2023 01:13   CT Hip Left Wo Contrast  Result Date: 09/30/2023 CLINICAL DATA:  Suspected fracture, negative x-ray EXAM: CT OF THE LEFT HIP WITHOUT CONTRAST TECHNIQUE: Multidetector CT imaging of the left hip was performed according to the standard protocol. Multiplanar CT image reconstructions were also generated. RADIATION DOSE REDUCTION: This exam was performed according to the departmental dose-optimization program which includes automated exposure control, adjustment of the mA and/or kV according to patient size and/or use of iterative reconstruction technique. COMPARISON:  Radiograph 09/30/2023, CT 08/06/2023, 09/17/2023, 09/10/2021 FINDINGS: Bones/Joint/Cartilage Status post intramedullary rodding and screw fixation  of the left femur for chronic left intertrochanteric fracture. Mild heterotopic ossification at the lesser trochanter. No dislocation. No convincing acute fracture. Old left inferior pubic ramus fracture. Heterogeneous sclerosis within the left sacrum, slightly progressive and consistent with subacute sacral insufficiency fracture. No sizable hip effusion.  Ligaments Suboptimally assessed by CT. Muscles and Tendons No intramuscular fluid collections. Soft tissues Advanced vascular calcifications. Incompletely visualized large left lower quadrant parastomal hernia containing mesentery and bowel, no adverse features on this exam. Negative for pelvic effusion. Clips in the pelvis. IMPRESSION: 1. Status post intramedullary rodding of the left femur for old intertrochanteric fracture. No definite acute osseous abnormality 2. Old left inferior pubic ramus fracture 3. Heterogeneous progressive sclerosis in the left sacrum consistent with healing/subacute sacral insufficiency fracture Electronically Signed   By: Jasmine Pang M.D.   On: 09/30/2023 23:03   DG Hip Unilat With Pelvis 2-3 Views Left  Result Date: 09/30/2023 CLINICAL DATA:  Fall EXAM: DG HIP (WITH OR WITHOUT PELVIS) 2-3V LEFT COMPARISON:  09/08/2021, 09/07/2021, abdomen radiograph 09/17/2023 FINDINGS: Numerous pelvic clips. Vascular calcifications. Status post intramedullary rodding of the left femur for old left intertrochanteric fracture. The hardware appears intact. No acute fracture abnormality is seen. Old appearing left pubic rami fractures. IMPRESSION: 1. No acute osseous abnormality. 2. Status post intramedullary rodding of the left femur for old left intertrochanteric fracture. Old left pubic rami fractures. Electronically Signed   By: Jasmine Pang M.D.   On: 09/30/2023 20:24   CT ABDOMEN PELVIS WO CONTRAST  Result Date: 09/17/2023 CLINICAL DATA:  Bilious vomiting for the past 2 days. EXAM: CT ABDOMEN AND PELVIS WITHOUT CONTRAST TECHNIQUE: Multidetector CT imaging of the abdomen and pelvis was performed following the standard protocol without IV contrast. RADIATION DOSE REDUCTION: This exam was performed according to the departmental dose-optimization program which includes automated exposure control, adjustment of the mA and/or kV according to patient size and/or use of iterative reconstruction  technique. COMPARISON:  08/06/2023, 09/10/2021 and chest CTA dated 07/28/2021. FINDINGS: Lower chest: Stable enlarged heart. Mild bibasilar scarring and atelectasis. Stable irregular nodule in the right lower lobe measuring 12 x 7 mm on image number 14/4. This is not changed significantly since 07/28/2021. Hepatobiliary: Gallstones in the gallbladder measuring up to 8 mm in diameter each. The gallbladder is contracted with no wall thickening or pericholecystic fluid. Previously demonstrated liver cysts are unchanged. These do not need imaging follow-up. Stable dilated bile ducts with a maximum common duct diameter of 12.7 mm. Pancreas: Unremarkable. No pancreatic ductal dilatation or surrounding inflammatory changes. Spleen: Normal in size without focal abnormality. Adrenals/Urinary Tract: Stable right adrenal gland low-density nodule with coarse calcifications measuring 1.9 x 0.9 cm on image number 10/2. The low-density component measures 11 Hounsfield units. Unremarkable left adrenal gland. Stable previously demonstrated simple bilateral renal cysts. These do not need follow-up. No current hydronephrosis or hydroureter. Urinary bladder partially obscured by streak artifacts with no visible acute abnormalities. Probable partially obscured bladder diverticula at the dome are unchanged. Stomach/Bowel: Stable left lower quadrant ostomy with a large peristomal hernia containing multiple small bowel loops and some colon. Some of the small bowel loops remain near the upper limit of normal size with no wall thickening or pneumatosis. Multiple normal caliber fluid-filled small bowel loops. No evidence of appendicitis. Unremarkable stomach. Vascular/Lymphatic: Atheromatous arterial calcifications without aneurysm. No enlarged lymph nodes. Reproductive: Again, the uterus and ovaries are not clearly visualized, partly due streak artifacts. Other: Stable left lower quadrant ostomy and large parastomal hernia  containing  herniated small bowel and colon without obstruction. Multiple bilateral pelvic surgical clips. Musculoskeletal: Lumbar and lower thoracic spine degenerative changes. Stable previously demonstrated lumbar spine compression deformities. Old, healed left sacral insufficiency fractures. IMPRESSION: 1. Stable left lower quadrant ostomy and large parastomal hernia containing herniated small bowel and colon without obstruction. 2. Multiple normal caliber fluid-filled small bowel loops. This can be seen with gastroenteritis. 3. Stable cholelithiasis. 4. Stable dilated bile ducts with no visible obstructing mass or stone. 5. Stable 12 x 7 mm irregular nodule in the right lower lobe, unchanged since 07/28/2021 and not needing follow-up unless otherwise clinically indicated. 6. Stable right adrenal gland adenoma with coarse, dystrophic calcifications. Electronically Signed   By: Beckie Salts M.D.   On: 09/17/2023 16:50   DG Abd Portable 1 View  Result Date: 09/17/2023 CLINICAL DATA:  Nausea and vomiting over the last 2 days. Abdominal hernia EXAM: PORTABLE ABDOMEN - 1 VIEW COMPARISON:  08/11/2023 FINDINGS: Scattered gas in large and small bowel. Left lower quadrant bulging abdominal contour compatible with hernia containing loops of bowel including a loop of what appears to be small-bowel based on valvular con of entities dilated up to 4.5 cm. Accordingly small-bowel obstruction in the vicinity of the hernia is not excluded. This could be better characterized with CT abdomen clinically warranted. Left hip IM nail system noted. Remote endplate compression fractures in the lumbar spine. Pelvic deformities from old fractures. Prior pelvic lymph node dissection with various vascular clips noted. IMPRESSION: 1. Left lower quadrant hernia containing loops of bowel including a loop of what appears to be small-bowel dilated up to 4.5 cm. Accordingly small-bowel obstruction in the vicinity of the hernia is not excluded. This could be  better characterized with CT abdomen clinically warranted. Electronically Signed   By: Gaylyn Rong M.D.   On: 09/17/2023 14:32    Microbiology: Results for orders placed or performed during the hospital encounter of 08/06/23  SARS Coronavirus 2 by RT PCR (hospital order, performed in [facility] hospital lab) *cepheid single result test* Anterior Nasal Swab     Status: None   Collection Time: 08/07/23  2:00 AM   Specimen: Anterior Nasal Swab  Result Value Ref Range Status   SARS Coronavirus 2 by RT PCR NEGATIVE NEGATIVE Final    Comment: (NOTE) SARS-CoV-2 target nucleic acids are NOT DETECTED.  The SARS-CoV-2 RNA is generally detectable in upper and lower respiratory specimens during the acute phase of infection. The lowest concentration of SARS-CoV-2 viral copies this assay can detect is 250 copies / mL. A negative result does not preclude SARS-CoV-2 infection and should not be used as the sole basis for treatment or other patient management decisions.  A negative result may occur with improper specimen collection / handling, submission of specimen other than nasopharyngeal swab, presence of viral mutation(s) within the areas targeted by this assay, and inadequate number of viral copies (<250 copies / mL). A negative result must be combined with clinical observations, patient history, and epidemiological information.  Fact Sheet for Patients:   RoadLapTop.co.za  Fact Sheet for Healthcare Providers: http://kim-miller.com/  This test is not yet approved or  cleared by the [place] FDA and has been authorized for detection and/or diagnosis of SARS-CoV-2 by FDA under an Emergency Use Authorization (EUA).  This EUA will remain in effect (meaning this test can be used) for the duration of the COVID-19 declaration under Section 564(b)(1) of the Act, 21 U.S.C. section 360bbb-3(b)(1), unless the authorization is terminated  or revoked  sooner.  Performed at [facility], [address] [place], [place] [address]     Labs: CBC: Recent Labs  Lab 10/01/23 0028 10/01/23 0438 10/02/23 0411 10/03/23 0342 10/04/23 0529  WBC 7.7 7.1 7.9 7.8 8.8  NEUTROABS 5.4  --  4.9 5.0 6.5  HGB 11.0* 11.0* 10.1* 10.8* 10.8*  HCT 35.0* 34.4* 30.1* 32.3* 33.1*  MCV 90.4 89.8 87.5 87.3 88.7  PLT 278 281 258 268 271   Basic Metabolic Panel: Recent Labs  Lab 10/01/23 0028 10/01/23 0438 10/02/23 0411 10/03/23 0342 10/04/23 0529  NA 137 136 133* 133* 135  K 3.5 3.5 3.6 3.6 3.9  CL 98 99 97* 96* 97*  CO2 24 25 26 24 27   GLUCOSE 98 90 92 89 76  BUN 17 16 25* 28* 29*  CREATININE 1.30* 1.26* 1.42* 1.20* 1.13*  CALCIUM 8.8* 8.6* 8.4* 8.8* 8.9   Liver Function Tests: Recent Labs  Lab 10/01/23 0028  AST 22  ALT 14  ALKPHOS 67  BILITOT 1.1  PROT 6.9  ALBUMIN 3.7   CBG: No results for input(s): "GLUCAP" in the last 168 hours.  Discharge time spent:  37 minutes.  Signed: Loyce Dys, MD [facility] 10/04/2023

## 2023-11-21 ENCOUNTER — Encounter: Payer: Self-pay | Admitting: Emergency Medicine

## 2023-11-21 ENCOUNTER — Emergency Department: Payer: Medicare Other

## 2023-11-21 ENCOUNTER — Inpatient Hospital Stay
Admission: EM | Admit: 2023-11-21 | Discharge: 2023-12-08 | DRG: 177 | Disposition: A | Discharge status: Skilled Nursing Facility | Payer: Medicare Other | Source: Skilled Nursing Facility | Attending: Student | Admitting: Student

## 2023-11-21 DIAGNOSIS — Z9049 Acquired absence of other specified parts of digestive tract: Secondary | ICD-10-CM

## 2023-11-21 DIAGNOSIS — F0390 Unspecified dementia without behavioral disturbance: Secondary | ICD-10-CM | POA: Diagnosis present

## 2023-11-21 DIAGNOSIS — I130 Hypertensive heart and chronic kidney disease with heart failure and stage 1 through stage 4 chronic kidney disease, or unspecified chronic kidney disease: Secondary | ICD-10-CM | POA: Diagnosis present

## 2023-11-21 DIAGNOSIS — Z79899 Other long term (current) drug therapy: Secondary | ICD-10-CM

## 2023-11-21 DIAGNOSIS — Z7401 Bed confinement status: Secondary | ICD-10-CM

## 2023-11-21 DIAGNOSIS — E43 Unspecified severe protein-calorie malnutrition: Secondary | ICD-10-CM | POA: Diagnosis present

## 2023-11-21 DIAGNOSIS — N184 Chronic kidney disease, stage 4 (severe): Secondary | ICD-10-CM | POA: Diagnosis present

## 2023-11-21 DIAGNOSIS — F0394 Unspecified dementia, unspecified severity, with anxiety: Secondary | ICD-10-CM | POA: Diagnosis present

## 2023-11-21 DIAGNOSIS — F03C11 Unspecified dementia, severe, with agitation: Secondary | ICD-10-CM | POA: Diagnosis not present

## 2023-11-21 DIAGNOSIS — N179 Acute kidney failure, unspecified: Secondary | ICD-10-CM | POA: Diagnosis present

## 2023-11-21 DIAGNOSIS — I10 Essential (primary) hypertension: Secondary | ICD-10-CM | POA: Diagnosis present

## 2023-11-21 DIAGNOSIS — F0393 Unspecified dementia, unspecified severity, with mood disturbance: Secondary | ICD-10-CM | POA: Diagnosis present

## 2023-11-21 DIAGNOSIS — U071 COVID-19: Secondary | ICD-10-CM | POA: Diagnosis present

## 2023-11-21 DIAGNOSIS — F32A Depression, unspecified: Secondary | ICD-10-CM | POA: Diagnosis present

## 2023-11-21 DIAGNOSIS — Z515 Encounter for palliative care: Secondary | ICD-10-CM

## 2023-11-21 DIAGNOSIS — F418 Other specified anxiety disorders: Secondary | ICD-10-CM | POA: Diagnosis not present

## 2023-11-21 DIAGNOSIS — J40 Bronchitis, not specified as acute or chronic: Secondary | ICD-10-CM | POA: Diagnosis present

## 2023-11-21 DIAGNOSIS — G9341 Metabolic encephalopathy: Secondary | ICD-10-CM | POA: Diagnosis not present

## 2023-11-21 DIAGNOSIS — I5032 Chronic diastolic (congestive) heart failure: Secondary | ICD-10-CM | POA: Diagnosis present

## 2023-11-21 DIAGNOSIS — Z66 Do not resuscitate: Secondary | ICD-10-CM | POA: Diagnosis present

## 2023-11-21 DIAGNOSIS — J1282 Pneumonia due to coronavirus disease 2019: Secondary | ICD-10-CM | POA: Diagnosis present

## 2023-11-21 DIAGNOSIS — L89152 Pressure ulcer of sacral region, stage 2: Secondary | ICD-10-CM | POA: Diagnosis present

## 2023-11-21 DIAGNOSIS — Z681 Body mass index (BMI) 19 or less, adult: Secondary | ICD-10-CM | POA: Diagnosis not present

## 2023-11-21 DIAGNOSIS — K435 Parastomal hernia without obstruction or  gangrene: Secondary | ICD-10-CM | POA: Diagnosis present

## 2023-11-21 DIAGNOSIS — Z85038 Personal history of other malignant neoplasm of large intestine: Secondary | ICD-10-CM

## 2023-11-21 DIAGNOSIS — J9601 Acute respiratory failure with hypoxia: Secondary | ICD-10-CM | POA: Diagnosis present

## 2023-11-21 DIAGNOSIS — R64 Cachexia: Secondary | ICD-10-CM | POA: Diagnosis present

## 2023-11-21 DIAGNOSIS — J069 Acute upper respiratory infection, unspecified: Secondary | ICD-10-CM | POA: Diagnosis not present

## 2023-11-21 DIAGNOSIS — Z933 Colostomy status: Secondary | ICD-10-CM | POA: Diagnosis not present

## 2023-11-21 DIAGNOSIS — F03B11 Unspecified dementia, moderate, with agitation: Secondary | ICD-10-CM | POA: Diagnosis not present

## 2023-11-21 DIAGNOSIS — E875 Hyperkalemia: Secondary | ICD-10-CM | POA: Diagnosis not present

## 2023-11-21 DIAGNOSIS — J189 Pneumonia, unspecified organism: Secondary | ICD-10-CM

## 2023-11-21 DIAGNOSIS — Z7189 Other specified counseling: Secondary | ICD-10-CM | POA: Diagnosis not present

## 2023-11-21 LAB — LACTIC ACID, PLASMA
Lactic Acid, Venous: 1.1 mmol/L (ref 0.5–1.9)
Lactic Acid, Venous: 0.9 mmol/L (ref 0.5–1.9)

## 2023-11-21 LAB — CBC WITH DIFFERENTIAL/PLATELET
Abs Immature Granulocytes: 0.09 10*3/uL — ABNORMAL HIGH (ref 0.00–0.07)
Basophils Absolute: 0 10*3/uL (ref 0.0–0.1)
Basophils Relative: 0 %
Eosinophils Absolute: 0 10*3/uL (ref 0.0–0.5)
Eosinophils Relative: 0 %
HCT: 36.8 % (ref 36.0–46.0)
Hemoglobin: 11.6 g/dL — ABNORMAL LOW (ref 12.0–15.0)
Immature Granulocytes: 1 %
Lymphocytes Relative: 13 %
Lymphs Abs: 1.1 10*3/uL (ref 0.7–4.0)
MCH: 28.1 pg (ref 26.0–34.0)
MCHC: 31.5 g/dL (ref 30.0–36.0)
MCV: 89.1 fL (ref 80.0–100.0)
Monocytes Absolute: 0.5 10*3/uL (ref 0.1–1.0)
Monocytes Relative: 7 %
Neutro Abs: 6.3 10*3/uL (ref 1.7–7.7)
Neutrophils Relative %: 79 %
Platelets: 405 10*3/uL — ABNORMAL HIGH (ref 150–400)
RBC: 4.13 MIL/uL (ref 3.87–5.11)
RDW: 14.6 % (ref 11.5–15.5)
WBC: 8 10*3/uL (ref 4.0–10.5)
nRBC: 0 % (ref 0.0–0.2)

## 2023-11-21 LAB — RESP PANEL BY RT-PCR (RSV, FLU A&B, COVID)  RVPGX2
Influenza A by PCR: NEGATIVE
Influenza B by PCR: NEGATIVE
Resp Syncytial Virus by PCR: NEGATIVE
SARS Coronavirus 2 by RT PCR: POSITIVE — AB

## 2023-11-21 LAB — BLOOD GAS, VENOUS
Acid-base deficit: 2.8 mmol/L — ABNORMAL HIGH (ref 0.0–2.0)
Bicarbonate: 21.9 mmol/L (ref 20.0–28.0)
O2 Saturation: 64.5 %
Patient temperature: 37
pCO2, Ven: 37 mm[Hg] — ABNORMAL LOW (ref 44–60)
pH, Ven: 7.38 (ref 7.25–7.43)
pO2, Ven: 38 mm[Hg] (ref 32–45)

## 2023-11-21 LAB — BASIC METABOLIC PANEL
Anion gap: 10 (ref 5–15)
BUN: 41 mg/dL — ABNORMAL HIGH (ref 8–23)
CO2: 22 mmol/L (ref 22–32)
Calcium: 8.5 mg/dL — ABNORMAL LOW (ref 8.9–10.3)
Chloride: 102 mmol/L (ref 98–111)
Creatinine, Ser: 1.89 mg/dL — ABNORMAL HIGH (ref 0.44–1.00)
GFR, Estimated: 24 mL/min — ABNORMAL LOW (ref >60)
Glucose, Bld: 94 mg/dL (ref 70–99)
Potassium: 4.1 mmol/L (ref 3.5–5.1)
Sodium: 134 mmol/L — ABNORMAL LOW (ref 135–145)

## 2023-11-21 LAB — BRAIN NATRIURETIC PEPTIDE: B Natriuretic Peptide: 317.6 pg/mL — ABNORMAL HIGH (ref 0.0–100.0)

## 2023-11-21 LAB — MRSA NEXT GEN BY PCR, NASAL: MRSA by PCR Next Gen: DETECTED — AB

## 2023-11-21 MED ORDER — SODIUM CHLORIDE 0.9 % IV SOLN
1.0000 g | Freq: Once | INTRAVENOUS | Status: AC
  Administered 2023-11-21: 1 g via INTRAVENOUS
  Filled 2023-11-21: qty 10

## 2023-11-21 MED ORDER — IPRATROPIUM-ALBUTEROL 0.5-2.5 (3) MG/3ML IN SOLN
3.0000 mL | RESPIRATORY_TRACT | Status: DC
  Administered 2023-11-21: 3 mL via RESPIRATORY_TRACT
  Filled 2023-11-21: qty 3

## 2023-11-21 MED ORDER — ENSURE ENLIVE PO LIQD
237.0000 mL | Freq: Two times a day (BID) | ORAL | Status: DC
  Administered 2023-11-22 – 2023-11-25 (×7): 237 mL via ORAL

## 2023-11-21 MED ORDER — HYDROCODONE-ACETAMINOPHEN 5-325 MG PO TABS
1.0000 | ORAL_TABLET | ORAL | Status: DC | PRN
  Administered 2023-11-27 – 2023-12-07 (×7): 1 via ORAL
  Filled 2023-11-21 (×2): qty 1
  Filled 2023-11-21: qty 2
  Filled 2023-11-21 (×4): qty 1

## 2023-11-21 MED ORDER — DM-GUAIFENESIN ER 30-600 MG PO TB12: 1.0000 | ORAL_TABLET | Freq: Two times a day (BID) | ORAL | Status: DC | PRN

## 2023-11-21 MED ORDER — ACETAMINOPHEN 325 MG PO TABS
650.0000 mg | ORAL_TABLET | Freq: Four times a day (QID) | ORAL | Status: DC | PRN
  Administered 2023-11-24 – 2023-11-29 (×5): 650 mg via ORAL
  Filled 2023-11-21 (×5): qty 2

## 2023-11-21 MED ORDER — METHYLPREDNISOLONE SODIUM SUCC 40 MG IJ SOLR
80.0000 mg | INTRAMUSCULAR | Status: DC
  Administered 2023-11-21 – 2023-11-22 (×2): 80 mg via INTRAVENOUS
  Filled 2023-11-21 (×2): qty 2

## 2023-11-21 MED ORDER — ALPRAZOLAM 0.25 MG PO TABS
0.2500 mg | ORAL_TABLET | Freq: Three times a day (TID) | ORAL | Status: DC | PRN
  Administered 2023-11-21 – 2023-12-08 (×22): 0.25 mg via ORAL
  Filled 2023-11-21 (×23): qty 1

## 2023-11-21 MED ORDER — SODIUM CHLORIDE 0.9 % IV SOLN
500.0000 mg | Freq: Once | INTRAVENOUS | Status: AC
  Administered 2023-11-21: 500 mg via INTRAVENOUS
  Filled 2023-11-21: qty 5

## 2023-11-21 MED ORDER — ACETAMINOPHEN 500 MG PO TABS
1000.0000 mg | ORAL_TABLET | Freq: Once | ORAL | Status: DC
  Filled 2023-11-21: qty 2

## 2023-11-21 MED ORDER — SODIUM CHLORIDE 0.9 % IV BOLUS
500.0000 mL | Freq: Once | INTRAVENOUS | Status: AC
  Administered 2023-11-21: 500 mL via INTRAVENOUS

## 2023-11-21 MED ORDER — MUPIROCIN 2 % EX OINT
1.0000 | TOPICAL_OINTMENT | Freq: Two times a day (BID) | CUTANEOUS | Status: AC
  Administered 2023-11-22 – 2023-11-26 (×10): 1 via NASAL
  Filled 2023-11-21: qty 22

## 2023-11-21 MED ORDER — ORAL CARE MOUTH RINSE: 15.0000 mL | OROMUCOSAL | Status: DC | PRN

## 2023-11-21 MED ORDER — DIPHENHYDRAMINE HCL 50 MG/ML IJ SOLN
12.5000 mg | Freq: Three times a day (TID) | INTRAMUSCULAR | Status: DC | PRN
  Administered 2023-11-21 – 2023-11-29 (×3): 12.5 mg via INTRAVENOUS
  Filled 2023-11-21 (×3): qty 1

## 2023-11-21 MED ORDER — CHLORHEXIDINE GLUCONATE CLOTH 2 % EX PADS
6.0000 | MEDICATED_PAD | Freq: Every day | CUTANEOUS | Status: AC
  Administered 2023-11-23 – 2023-11-26 (×4): 6 via TOPICAL

## 2023-11-21 MED ORDER — MIRTAZAPINE 15 MG PO TABS
7.5000 mg | ORAL_TABLET | Freq: Every day | ORAL | Status: DC
  Administered 2023-11-21 – 2023-12-01 (×10): 7.5 mg via ORAL
  Filled 2023-11-21 (×10): qty 1

## 2023-11-21 MED ORDER — ALBUTEROL SULFATE (2.5 MG/3ML) 0.083% IN NEBU
2.5000 mg | INHALATION_SOLUTION | RESPIRATORY_TRACT | Status: DC | PRN
  Administered 2023-11-29: 2.5 mg via RESPIRATORY_TRACT
  Filled 2023-11-21: qty 3

## 2023-11-21 MED ORDER — HYDRALAZINE HCL 20 MG/ML IJ SOLN: 5.0000 mg | INTRAMUSCULAR | Status: DC | PRN

## 2023-11-21 MED ORDER — IPRATROPIUM-ALBUTEROL 20-100 MCG/ACT IN AERS
1.0000 | INHALATION_SPRAY | RESPIRATORY_TRACT | Status: DC
  Administered 2023-11-21 – 2023-12-08 (×70): 1 via RESPIRATORY_TRACT
  Filled 2023-11-21: qty 4

## 2023-11-21 MED ORDER — HEPARIN SODIUM (PORCINE) 5000 UNIT/ML IJ SOLN
5000.0000 [IU] | Freq: Three times a day (TID) | INTRAMUSCULAR | Status: DC
  Administered 2023-11-21 – 2023-12-08 (×50): 5000 [IU] via SUBCUTANEOUS
  Filled 2023-11-21 (×49): qty 1

## 2023-11-21 NOTE — Progress Notes (Signed)
   11/21/23 2038  Assess: MEWS Score  Temp 99.5 F (37.5 C)  BP 126/67  MAP (mmHg) 84  Pulse Rate 94  ECG Heart Rate 96  Resp (!) 28  Level of Consciousness Alert  SpO2 95 %  O2 Device Nasal Cannula  O2 Flow Rate (L/min) 6 L/min  Assess: MEWS Score  MEWS Temp 0  MEWS Systolic 0  MEWS Pulse 0  MEWS RR 2  MEWS LOC 0  MEWS Score 2  MEWS Score Color Yellow  Assess: if the MEWS score is Yellow or Red  Were vital signs accurate and taken at a resting state? Yes  Does the patient meet 2 or more of the SIRS criteria? Yes  Does the patient have a confirmed or suspected source of infection? Yes  MEWS guidelines implemented  Yes, yellow  Treat  MEWS Interventions Considered administering scheduled or prn medications/treatments as ordered  Take Vital Signs  Increase Vital Sign Frequency  Yellow: Q2hr x1, continue Q4hrs until patient remains green for 12hrs  Escalate  MEWS: Escalate Yellow: Discuss with charge nurse and consider notifying provider and/or RRT  Notify: Charge Nurse/RN  Name of Charge Nurse/RN Notified Alex M  Assess: SIRS CRITERIA  SIRS Temperature  0  SIRS Pulse 1  SIRS Respirations  1  SIRS WBC 0  SIRS Score Sum  2

## 2023-11-21 NOTE — ED Notes (Addendum)
Pt yelling out at this time. When asking pt of needs. Pt unable to express any needs and RN is unable to decipher what pt is saying.

## 2023-11-21 NOTE — H&P (Signed)
History and Physical    Autumn Johnston HYW:[id number] DOB: Jul 13, 1922 DOA: 11/21/2023  Referring MD/NP/PA:   PCP: Marguarite Arbour, MD   Patient coming from:  The patient is coming from SNF   Chief Complaint: SOB, cough  HPI: Autumn Johnston is a 89 y.o. female with medical history significant of dCHF, HTN, dementia per her daughter, depression with anxiety, CKD-4, colon cancer (s/p colostomy and s/p of colectomy with a chronic large para colostomy hernia), who presents with SOB and cough.  Per her daughter (I called her daughter by phone), patient started coughing with shortness of breath since Friday, which has been progressively worsening, mostly with dry cough.  Patient denies chest pain.  No fever or chills.  No nausea, vomiting, diarrhea or abdominal pain.  Patient is not using oxygen normally, but came in with 4L of oxygen with 97%, which desaturated to 80% when trying to decrease to 2 L oxygen in ED. When I saw patient on floor, patient is requiring 4-6 L oxygen, with tachypnea and moderate acute respiratory distress. She has difficult speaking in full sentence.  Her voice is hoarse.  Denies symptoms of UTI.   Data reviewed independently and ED Course: pt was found to have WBC 8.0, BNP 317, lactic acid 1.1 --> 1.0, worsening renal function with creatinine 1.89, BUN 41, GFR 24 (recent baseline creatinine 1.13 on 10/04/2023.  Temperature 99.6, blood pressure 123/55, heart rate 84, RR in 30s.  Chest x-ray showed left basilar opacity.  Patient is admitted to PCU as inpatient.   EKG: I have personally reviewed.  Sinus rhythm, QTc 503, and stable baseline, poor R wave progression, borderline LAD, left bundle blockage   Review of Systems:   General: no fevers, chills, no body weight gain, fatigue HEENT: no blurry vision, hearing changes or sore throat Respiratory: has dyspnea, coughing, no wheezing CV: no chest pain, no palpitations GI: no nausea, vomiting, abdominal pain,  diarrhea, constipation. Has colostomy with paracolostomy hernia GU: no dysuria, burning on urination, increased urinary frequency, hematuria  Ext: no leg edema Neuro: no unilateral weakness, numbness, or tingling, no vision change or hearing loss Skin: no rash, no skin tear. MSK: No muscle spasm, no deformity, no limitation of range of movement in spin Heme: No easy bruising.  Travel history: No recent long distant travel.   Allergy: No Known Allergies  Past Medical History:  Diagnosis Date   CKD (chronic kidney disease) stage 4, GFR 15-29 ml/min (HCC)    Colon cancer (HCC)    s/p colostomy   Dementia (HCC)    Depression    Hypertension    Parastomal hernia     Past Surgical History:  Procedure Laterality Date   COLECTOMY WITH COLOSTOMY CREATION/HARTMANN PROCEDURE     INTRAMEDULLARY (IM) NAIL INTERTROCHANTERIC Left 09/08/2021   Procedure: INTRAMEDULLARY (IM) NAIL INTERTROCHANTRIC;  Surgeon: Signa Kell, MD;  Location: [facility] ORS;  Service: Orthopedics;  Laterality: Left;    Social History:  reports that she has never smoked. She has never used smokeless tobacco. She reports that she does not drink alcohol and does not use drugs.  Family History:   Could not be reviewed accurately due to dementia.  Prior to Admission medications   Medication Sig Start Date End Date Taking? Authorizing Provider  ALPRAZolam (XANAX) 0.25 MG tablet Take 1 tablet (0.25 mg total) by mouth 2 (two) times daily as needed. 10/04/23   Loyce Dys, MD  amLODipine (NORVASC) 10 MG tablet Take 1 tablet (10  mg total) by mouth daily. 10/04/23 11/03/23  Loyce Dys, MD  HYDROcodone-acetaminophen (NORCO/VICODIN) 5-325 MG tablet Take 1-2 tablets by mouth every 4 (four) hours as needed for moderate pain (pain score 4-6). 10/04/23   Loyce Dys, MD  isosorbide mononitrate (IMDUR) 30 MG 24 hr tablet Take 0.5 tablets (15 mg total) by mouth daily. 10/04/23 11/03/23  Loyce Dys, MD  ondansetron (ZOFRAN-ODT) 4  MG disintegrating tablet Take 1 tablet (4 mg total) by mouth every 8 (eight) hours as needed for nausea or vomiting. 09/17/23   Dionne Bucy, MD  polyethylene glycol (MIRALAX / GLYCOLAX) 17 g packet Take 17 g by mouth daily as needed for mild constipation. 04/02/22   Enedina Finner, MD  solifenacin (VESICARE) 10 MG tablet Take 10 mg by mouth daily. 08/04/23 08/03/24  [provider]    Physical Exam: Vitals:   11/21/23 1930 11/21/23 1942 11/21/23 2038 11/21/23 2306  BP: 116/84  126/67 (!) 94/49  Pulse: 85 89 94 70  Resp: (!) 36 (!) 28 (!) 28 (!) 25  Temp:  99.7 F (37.6 C) 99.5 F (37.5 C) 98.9 F (37.2 C)  TempSrc:  Axillary Oral Axillary  SpO2: 93% 92% 95% 90%   General: In moderate acute respiratory distress HEENT:       Eyes: PERRL, EOMI, no jaundice       ENT: No discharge from the ears and nose, no pharynx injection, no tonsillar enlargement.        Neck: No JVD, no bruit, no mass felt. Heme: No neck lymph node enlargement. Cardiac: S1/S2, RRR, No murmurs, No gallops or rubs. Respiratory: Has coarse breathing sound bilaterally GI: Soft, nondistended, nontender, no rebound pain, no organomegaly, BS present. Has colostomy in place, with a large paracolostomy hernia GU: No hematuria Ext: No pitting leg edema bilaterally. 1+DP/PT pulse bilaterally. Musculoskeletal: No joint deformities, No joint redness or warmth, no limitation of ROM in spin. Skin: No rashes.  Neuro: Alert, knows her own name, seems to know that she is in hospital, confused about time.  Cranial nerves II-XII grossly intact, moves all extremities normally.  Psych: Patient is not psychotic, no suicidal or hemocidal ideation.  Labs on Admission: I have personally reviewed following labs and imaging studies  CBC: Recent Labs  Lab 11/21/23 1728  WBC 8.0  NEUTROABS 6.3  HGB 11.6*  HCT 36.8  MCV 89.1  PLT 405*   Basic Metabolic Panel: Recent Labs  Lab 11/21/23 1728  NA 134*  K 4.1  CL 102   CO2 22  GLUCOSE 94  BUN 41*  CREATININE 1.89*  CALCIUM 8.5*   GFR: CrCl cannot be calculated (Unknown ideal weight.). Liver Function Tests: No results for input(s): "AST", "ALT", "ALKPHOS", "BILITOT", "PROT", "ALBUMIN" in the last 168 hours. No results for input(s): "LIPASE", "AMYLASE" in the last 168 hours. No results for input(s): "AMMONIA" in the last 168 hours. Coagulation Profile: No results for input(s): "INR", "PROTIME" in the last 168 hours. Cardiac Enzymes: No results for input(s): "CKTOTAL", "CKMB", "CKMBINDEX", "TROPONINI" in the last 168 hours. BNP (last 3 results) No results for input(s): "PROBNP" in the last 8760 hours. HbA1C: No results for input(s): "HGBA1C" in the last 72 hours. CBG: No results for input(s): "GLUCAP" in the last 168 hours. Lipid Profile: No results for input(s): "CHOL", "HDL", "LDLCALC", "TRIG", "CHOLHDL", "LDLDIRECT" in the last 72 hours. Thyroid Function Tests: No results for input(s): "TSH", "T4TOTAL", "FREET4", "T3FREE", "THYROIDAB" in the last 72 hours. Anemia Panel: No results  for input(s): "VITAMINB12", "FOLATE", "FERRITIN", "TIBC", "IRON", "RETICCTPCT" in the last 72 hours. Urine analysis:    Component Value Date/Time   COLORURINE YELLOW (A) 09/30/2023 1830   APPEARANCEUR CLEAR (A) 09/30/2023 1830   LABSPEC 1.012 09/30/2023 1830   PHURINE 5.0 09/30/2023 1830   GLUCOSEU NEGATIVE 09/30/2023 1830   HGBUR NEGATIVE 09/30/2023 1830   BILIRUBINUR NEGATIVE 09/30/2023 1830   KETONESUR NEGATIVE 09/30/2023 1830   PROTEINUR 30 (A) 09/30/2023 1830   NITRITE NEGATIVE 09/30/2023 1830   LEUKOCYTESUR NEGATIVE 09/30/2023 1830   Sepsis Labs: @LABRCNTIP (procalcitonin:4,lacticidven:4) ) Recent Results (from the past 240 hour(s))  Resp panel by RT-PCR (RSV, Flu A&B, Covid) Anterior Nasal Swab     Status: Abnormal   Collection Time: 11/21/23  5:28 PM   Specimen: Anterior Nasal Swab  Result Value Ref Range Status   SARS Coronavirus 2 by RT PCR  POSITIVE (A) NEGATIVE Final    Comment: (NOTE) SARS-CoV-2 target nucleic acids are DETECTED.  The SARS-CoV-2 RNA is generally detectable in upper respiratory specimens during the acute phase of infection. Positive results are indicative of the presence of the identified virus, but do not rule out bacterial infection or co-infection with other pathogens not detected by the test. Clinical correlation with patient history and other diagnostic information is necessary to determine patient infection status. The expected result is Negative.  Fact Sheet for Patients: BloggerCourse.com  Fact Sheet for Healthcare Providers: SeriousBroker.it  This test is not yet approved or cleared by the [place] FDA and  has been authorized for detection and/or diagnosis of SARS-CoV-2 by FDA under an Emergency Use Authorization (EUA).  This EUA will remain in effect (meaning this test can be used) for the duration of  the COVID-19 declaration under Section 564(b)(1) of the A ct, 21 U.S.C. section 360bbb-3(b)(1), unless the authorization is terminated or revoked sooner.     Influenza A by PCR NEGATIVE NEGATIVE Final   Influenza B by PCR NEGATIVE NEGATIVE Final    Comment: (NOTE) The Xpert Xpress SARS-CoV-2/FLU/RSV plus assay is intended as an aid in the diagnosis of influenza from Nasopharyngeal swab specimens and should not be used as a sole basis for treatment. Nasal washings and aspirates are unacceptable for Xpert Xpress SARS-CoV-2/FLU/RSV testing.  Fact Sheet for Patients: BloggerCourse.com  Fact Sheet for Healthcare Providers: SeriousBroker.it  This test is not yet approved or cleared by the [place] FDA and has been authorized for detection and/or diagnosis of SARS-CoV-2 by FDA under an Emergency Use Authorization (EUA). This EUA will remain in effect (meaning this test can be used)  for the duration of the COVID-19 declaration under Section 564(b)(1) of the Act, 21 U.S.C. section 360bbb-3(b)(1), unless the authorization is terminated or revoked.     Resp Syncytial Virus by PCR NEGATIVE NEGATIVE Final    Comment: (NOTE) Fact Sheet for Patients: BloggerCourse.com  Fact Sheet for Healthcare Providers: SeriousBroker.it  This test is not yet approved or cleared by the [place] FDA and has been authorized for detection and/or diagnosis of SARS-CoV-2 by FDA under an Emergency Use Authorization (EUA). This EUA will remain in effect (meaning this test can be used) for the duration of the COVID-19 declaration under Section 564(b)(1) of the Act, 21 U.S.C. section 360bbb-3(b)(1), unless the authorization is terminated or revoked.  Performed at [facility], [address], [place], [place] [address]   MRSA Next Gen by PCR, Nasal     Status: Abnormal   Collection Time: 11/21/23 10:04 PM   Specimen:  Nasal Mucosa; Nasal Swab  Result Value Ref Range Status   MRSA by PCR Next Gen DETECTED (A) NOT DETECTED Final    Comment: RESULT CALLED TO, READ BACK BY AND VERIFIED WITH: KARA CAMPBELL @2356  ON 11/21/23 SKL (NOTE) The GeneXpert MRSA Assay (FDA approved for NASAL specimens only), is one component of a comprehensive MRSA colonization surveillance program. It is not intended to diagnose MRSA infection nor to guide or monitor treatment for MRSA infections. Test performance is not FDA approved in patients less than 1 years old. Performed at [facility], [address] [place], [place] [address]      Radiological Exams on Admission: DG Chest [facility] 1 View  Result Date: 11/21/2023 CLINICAL DATA:  Cough. EXAM: PORTABLE CHEST 1 VIEW COMPARISON:  August 06, 2023. FINDINGS: Stable cardiomediastinal silhouette. Increased left basilar opacity is noted suggesting worsening atelectasis or infiltrate with  associated effusion. Minimal right basilar subsegmental atelectasis is noted. Bony thorax is unremarkable. IMPRESSION: Increased left basilar opacity is noted suggesting worsening atelectasis or infiltrate with associated effusion. Electronically Signed   By: Lupita Raider M.D.   On: 11/21/2023 17:46      Assessment/Plan Principal Problem:   Acute respiratory disease due to COVID-19 virus Active Problems:   Acute respiratory failure with hypoxia (HCC)   Chronic diastolic CHF (congestive heart failure) (HCC)   Essential hypertension   Chronic kidney disease (CKD), stage IV (severe) (HCC)   Dementia (HCC)   Depression with anxiety   Protein-calorie malnutrition, severe   Assessment and Plan:  Acute respiratory disease due to COVID-19 virus and acute respiratory failure with hypoxia (HCC): pt has 4-6 L of new oxygen requirement.  Chest x-ray showed left basilar opacity, but no no leukocytosis, low suspicions for bacterial pneumonia.  Patient received 1 dose of Rocephin and azithromycin in ED, will not continue antibiotics.  -Admit to PCU as inpatient -Check VBG --> pH 7.38, pCO2 37, O2 38 -Bronchodilators -As needed Mucinex -Solu-Medrol 40 mg twice daily -Nasal cannula oxygen to maintain oxygen saturation above 93% -will start BiPAP if patient is persistently tachypneic and if RR> 35  Chronic diastolic CHF (congestive heart failure) (HCC): 2D echo on 07/29/2021 showed EF 60 to 65% with grade 2 diastolic dysfunction.  BNP is elevated 319, but patient does not have leg edema or JVD, does not seem to have CHF exacerbation. -Hold diuretics worsening renal function  Essential hypertension: Blood pressure 94/49 -IV hydralazine as needed -Hold amlodipine and Imdur due to softer blood pressure  Chronic kidney disease (CKD), stage IV (severe) [facility]): Patient has worsening renal function.  Recent baseline creatinine 1.13 on 10/04/2023.  Her creatinine is 1.89, BUN 41, GFR 24 today. -Patient  received 500 cc normal saline in ED -Avoid using renal toxic medications  Dementia (HCC) -Fall precaution  Depression with anxiety -Continue home as needed Xanax  Protein-calorie malnutrition, severe: Body weight 43.7 kg, BMI 17.4 -Ensure -Nutrition consult       DVT ppx: SQ Heparin   Code Status: DNR per her daughter  Family Communication: yes, patient's daughter by phone  Disposition Plan:  Anticipate discharge back to previous environment  Consults called:  none  Admission status and Level of care: Progressive:   as inpt     Dispo: The patient is from: SNF              Anticipated d/c is to: SNF              Anticipated d/c date  is: 2 days              Patient currently is not medically stable to d/c.    Severity of Illness:  The appropriate patient status for this patient is INPATIENT. Inpatient status is judged to be reasonable and necessary in order to provide the required intensity of service to ensure the patient's safety. The patient's presenting symptoms, physical exam findings, and initial radiographic and laboratory data in the context of their chronic comorbidities is felt to place them at high risk for further clinical deterioration. Furthermore, it is not anticipated that the patient will be medically stable for discharge from the hospital within 2 midnights of admission.   * I certify that at the point of admission it is my clinical judgment that the patient will require inpatient hospital care spanning beyond 2 midnights from the point of admission due to high intensity of service, high risk for further deterioration and high frequency of surveillance required.*       Date of Service 11/22/2023    Lorretta Harp [facility]   If 7PM-7AM, please contact night-coverage [email or web address] 11/22/2023, 12:26 AM

## 2023-11-21 NOTE — Progress Notes (Signed)
Pt RR 26-30 when pt is calm but pt is yelling out periodically while in the room. Placed humidity on pt oxygen of 6L with SPO2 > 94%. RN gave meds to hopefully help calm patient. Discussed with Dr. Clyde Lundborg, pt will be placed on Bipap if RR >35.

## 2023-11-21 NOTE — ED Triage Notes (Signed)
Pt here from [facility] nursing home , pt sent out fro weakness related to her being Covid  positive pt is on 4 liters sats 97 %

## 2023-11-21 NOTE — ED Provider Notes (Addendum)
[facility] Provider Note    Event Date/Time   First MD Initiated Contact with Patient 11/21/23 1639     (approximate)   History   Shortness of Breath   HPI  Autumn Johnston is a 89 y.o. female   Past medical history of CKD, colon cancer, hypertension, dementia, who presents to the emergency department today from her care facility with reports of COVID-positive, productive cough, and hypoxemia.  Has been sick for several days.  No baseline O2 requirement but hypoxemic requiring nasal cannula oxygen.  Patient is demented not offering much of a history but does admit to cough.  She denies any pain or obvious discomfort at the moment.  She is hypoxemic transition to 2 L from 4 L upon EMS arrival and quickly desaturates to the mid 80s.  Put back on 4 L nasal cannula with improvement to the mid 90s.  Independent Historian contributed to assessment above: I spoke with daughter Boneta Lucks on the phone who confirms DNR DNI status (paperwork comes with patient) and would like to proceed with blood testing, IV fluids, antibiotics, chest x-ray, and hospitalization for hypoxemia.  Physical Exam   Triage Vital Signs: ED Triage Vitals [11/21/23 1639]  Encounter Vitals Group     BP      Systolic BP Percentile      Diastolic BP Percentile      Pulse      Resp      Temp      Temp src      SpO2      Weight      Height      Head Circumference      Peak Flow      Pain Score 0     Pain Loc      Pain Education      Exclude from Growth Chart     Most recent vital signs: Vitals:   11/21/23 1640  BP: (!) 123/55  Pulse: 84  Resp: 16  Temp: 99.6 F (37.6 C)  SpO2: 94%    General: Awake, no distress.  CV:  Good peripheral perfusion.  Resp:  Normal effort.  Abd:  No distention.  Other:  Frail appearing woman in no acute distress and no respiratory distress, temperature is 99.6, otherwise normal hemodynamics, oxygen saturation 95% on 4 L nasal cannula.  Lungs  with no obvious rales or focalities.  Soft benign abdominal exam.   ED Results / Procedures / Treatments   Labs (all labs ordered are listed, but only abnormal results are displayed) Labs Reviewed  BASIC METABOLIC PANEL - Abnormal; Notable for the following components:      Result Value   Sodium 134 (*)    BUN 41 (*)    Creatinine, Ser 1.89 (*)    Calcium 8.5 (*)    GFR, Estimated 24 (*)    All other components within normal limits  CBC WITH DIFFERENTIAL/PLATELET - Abnormal; Notable for the following components:   Hemoglobin 11.6 (*)    Platelets 405 (*)    Abs Immature Granulocytes 0.09 (*)    All other components within normal limits  RESP PANEL BY RT-PCR (RSV, FLU A&B, COVID)  [id number]  LACTIC ACID, PLASMA  LACTIC ACID, PLASMA     I ordered and reviewed the above labs they are notable for normal white blood cell count  EKG  ED ECG REPORT I, Pilar Jarvis, the attending physician, personally viewed and interpreted this ECG.   Date:  11/21/2023  EKG Time: 1725  Rate: 86  Rhythm: sinus  Axis: nl  Intervals:lbbb  ST&T Change: no overt ischemic changes with caveat that the baseline is very poor    RADIOLOGY I independently reviewed and interpreted x-ray and see a left lower lobe opacity concerning for pneumonia I also reviewed radiologist's formal read.   PROCEDURES:  Critical Care performed: Yes, see critical care procedure note(s)  .Critical Care  Performed by: Pilar Jarvis, MD Authorized by: Pilar Jarvis, MD   Critical care provider statement:    Critical care time (minutes):  30   Critical care was time spent personally by me on the following activities:  Development of treatment plan with patient or surrogate, discussions with consultants, evaluation of patient's response to treatment, examination of patient, ordering and review of laboratory studies, ordering and review of radiographic studies, ordering and performing treatments and interventions, pulse oximetry,  re-evaluation of patient's condition and review of old charts    MEDICATIONS ORDERED IN ED: Medications  acetaminophen (TYLENOL) tablet 1,000 mg (1,000 mg Oral Not Given 11/21/23 1711)  cefTRIAXone (ROCEPHIN) 1 g in sodium chloride 0.9 % 100 mL IVPB (1 g Intravenous New Bag/Given 11/21/23 1757)  azithromycin (ZITHROMAX) 500 mg in sodium chloride 0.9 % 250 mL IVPB (has no administration in time range)  sodium chloride 0.9 % bolus 500 mL (500 mLs Intravenous New Bag/Given 11/21/23 1733)    External physician / consultants:  I spoke with hospitalist medicine doctor for admission and regarding care plan for this patient.   IMPRESSION / MDM / ASSESSMENT AND PLAN / ED COURSE  I reviewed the triage vital signs and the nursing notes.                                Patient's presentation is most consistent with acute presentation with potential threat to life or bodily function.  Differential diagnosis includes, but is not limited to, acute hypoxemic respiratory failure due to respiratory infection like viral URI, COVID, flu, or bacterial pneumonia    The patient is on the cardiac monitor to evaluate for evidence of arrhythmia and/or significant heart rate changes.  MDM:    This is a patient with acute hypoxemic respiratory failure requiring 4 L nasal cannula to maintain oxygen saturations, with reported COVID-positive from her care facility.  Goals of care discussion with daughter agree with IV antibiotics as needed, oxygen therapy and hospitalization.  Patient is otherwise DNR/DNI.  Will obtain viral swabs, labs, chest x-ray and start antibiotics if bacterial pneumonia suggested by workup otherwise keep on nasal cannula oxygen admission for respiratory infection causing hypoxemia.  I considered PE but I think this is less likely in the setting of respiratory infectious symptoms.  Does not appear to be heart failure as she does not appear fluid overload on exam  -- AKI, got fluids.  Left  lower lobe pneumonia identified on chest x-ray, will give community-acquired pneumonia coverage.  Viral swabs pending.  Admission.       FINAL CLINICAL IMPRESSION(S) / ED DIAGNOSES   Final diagnoses:  Bronchitis  Acute hypoxemic respiratory failure (HCC)  Pneumonia of left lung due to infectious organism, unspecified part of lung  AKI (acute kidney injury) (HCC)     Rx / DC Orders   ED Discharge Orders     None        Note:  This document was prepared using Dragon voice recognition software and  may include unintentional dictation errors.    Pilar Jarvis, MD 11/21/23 Derrill Memo    Pilar Jarvis, MD 11/21/23 Flossie Buffy    Pilar Jarvis, MD 11/21/23 [phone]

## 2023-11-21 NOTE — ED Notes (Signed)
Pt still yelling out as soon as this RN leaves the rm. Pt does not express any needs when asked. Pt continues to speak in sentences that do not make sense.

## 2023-11-21 NOTE — ED Notes (Signed)
Pt asking RN to stay with her at this time. This RN explained that RN would be unable to stay with her. RN assured pt that she would be back.

## 2023-11-22 DIAGNOSIS — J9601 Acute respiratory failure with hypoxia: Secondary | ICD-10-CM | POA: Diagnosis not present

## 2023-11-22 DIAGNOSIS — I10 Essential (primary) hypertension: Secondary | ICD-10-CM | POA: Diagnosis not present

## 2023-11-22 DIAGNOSIS — U071 COVID-19: Secondary | ICD-10-CM | POA: Diagnosis not present

## 2023-11-22 DIAGNOSIS — I5032 Chronic diastolic (congestive) heart failure: Secondary | ICD-10-CM | POA: Diagnosis not present

## 2023-11-22 LAB — CBC
HCT: 32.9 % — ABNORMAL LOW (ref 36.0–46.0)
Hemoglobin: 10.6 g/dL — ABNORMAL LOW (ref 12.0–15.0)
MCH: 28.5 pg (ref 26.0–34.0)
MCHC: 32.2 g/dL (ref 30.0–36.0)
MCV: 88.4 fL (ref 80.0–100.0)
Platelets: 332 10*3/uL (ref 150–400)
RBC: 3.72 MIL/uL — ABNORMAL LOW (ref 3.87–5.11)
RDW: 14.5 % (ref 11.5–15.5)
WBC: 7.5 10*3/uL (ref 4.0–10.5)
nRBC: 0 % (ref 0.0–0.2)

## 2023-11-22 LAB — BASIC METABOLIC PANEL
Anion gap: 8 (ref 5–15)
BUN: 41 mg/dL — ABNORMAL HIGH (ref 8–23)
CO2: 21 mmol/L — ABNORMAL LOW (ref 22–32)
Calcium: 8.2 mg/dL — ABNORMAL LOW (ref 8.9–10.3)
Chloride: 107 mmol/L (ref 98–111)
Creatinine, Ser: 1.79 mg/dL — ABNORMAL HIGH (ref 0.44–1.00)
GFR, Estimated: 25 mL/min — ABNORMAL LOW (ref >60)
Glucose, Bld: 164 mg/dL — ABNORMAL HIGH (ref 70–99)
Potassium: 4.8 mmol/L (ref 3.5–5.1)
Sodium: 136 mmol/L (ref 135–145)

## 2023-11-22 MED ORDER — VITAMIN C 500 MG PO TABS
500.0000 mg | ORAL_TABLET | Freq: Two times a day (BID) | ORAL | Status: DC
  Administered 2023-11-22 – 2023-12-08 (×32): 500 mg via ORAL
  Filled 2023-11-22 (×32): qty 1

## 2023-11-22 MED ORDER — ZINC SULFATE 220 (50 ZN) MG PO CAPS
220.0000 mg | ORAL_CAPSULE | Freq: Every day | ORAL | Status: AC
  Administered 2023-11-22 – 2023-12-05 (×14): 220 mg via ORAL
  Filled 2023-11-22 (×14): qty 1

## 2023-11-22 MED ORDER — METHYLPREDNISOLONE SODIUM SUCC 40 MG IJ SOLR
40.0000 mg | Freq: Every day | INTRAMUSCULAR | Status: DC
  Administered 2023-11-23 – 2023-11-25 (×3): 40 mg via INTRAVENOUS
  Filled 2023-11-22 (×3): qty 1

## 2023-11-22 MED ORDER — FESOTERODINE FUMARATE ER 4 MG PO TB24
4.0000 mg | ORAL_TABLET | Freq: Every day | ORAL | Status: DC
  Administered 2023-11-22 – 2023-12-08 (×17): 4 mg via ORAL
  Filled 2023-11-22 (×18): qty 1

## 2023-11-22 MED ORDER — ADULT MULTIVITAMIN W/MINERALS CH
1.0000 | ORAL_TABLET | Freq: Every day | ORAL | Status: DC
  Administered 2023-11-22 – 2023-12-08 (×17): 1 via ORAL
  Filled 2023-11-22 (×17): qty 1

## 2023-11-22 MED ORDER — POLYETHYLENE GLYCOL 3350 17 G PO PACK
17.0000 g | PACK | Freq: Every day | ORAL | Status: DC | PRN
  Administered 2023-11-28 – 2023-11-29 (×2): 17 g via ORAL
  Filled 2023-11-22 (×2): qty 1

## 2023-11-22 NOTE — TOC Initial Note (Signed)
Transition of Care [facility]) - Initial/Assessment Note    Patient Details  Name: Autumn Johnston MRN: [id number] Date of Birth: 02-09-1922  Transition of Care [facility]) CM/SW Contact:    Truddie Hidden, RN Phone Number: 11/22/2023, 3:29 PM  Clinical Narrative:                 Patient is from [facility] for LTC.         Patient Goals and CMS Choice            Expected Discharge Plan and Services                                              Prior Living Arrangements/Services                       Activities of Daily Living   ADL Screening (condition at time of admission) Independently performs ADLs?: No Does the patient have a NEW difficulty with bathing/dressing/toileting/self-feeding that is expected to last >3 days?: No Does the patient have a NEW difficulty with getting in/out of bed, walking, or climbing stairs that is expected to last >3 days?: No Does the patient have a NEW difficulty with communication that is expected to last >3 days?: No Is the patient deaf or have difficulty hearing?: Yes Does the patient have difficulty seeing, even when wearing glasses/contacts?: No Does the patient have difficulty concentrating, remembering, or making decisions?: Yes  Permission Sought/Granted                  Emotional Assessment              Admission diagnosis:  Bronchitis [J40] Acute respiratory failure with hypoxia (HCC) [J96.01] AKI (acute kidney injury) (HCC) [N17.9] Acute hypoxemic respiratory failure (HCC) [J96.01] Pneumonia of left lung due to infectious organism, unspecified part of lung [J18.9] Acute respiratory disease due to COVID-19 virus [U07.1, J06.9] Patient Active Problem List   Diagnosis Date Noted   Acute respiratory disease due to COVID-19 virus 11/21/2023   Chronic diastolic CHF (congestive heart failure) (HCC) 11/21/2023   Depression with anxiety 11/21/2023   Acute respiratory failure with hypoxia (HCC)  11/21/2023   Ambulatory dysfunction 10/01/2023   Sacral insufficiency fracture, subacute 10/01/2023   Caregiver unable to cope 10/01/2023   AKI (acute kidney injury) (HCC) 10/01/2023   Dementia (HCC)    Parastomal hernia    Frailty 08/06/2023   Closed compression fracture of L4 vertebra (HCC) 08/06/2023   Hydronephrosis of right kidney 08/06/2023   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) 08/06/2023   CAP (community acquired pneumonia) 03/31/2022   Pressure injury of skin 09/12/2021   Acute blood loss anemia    COVID-19 virus infection    Left lower quadrant abdominal mass    Hypotension    Pre-op evaluation    Stage 3b chronic kidney disease (HCC)    Closed hip fracture requiring operative repair, left, sequela 09/07/2021   Fall at home, initial encounter 09/07/2021   Protein-calorie malnutrition, severe 07/31/2021   Nonsustained ventricular tachycardia (HCC) 07/30/2021   Hyponatremia 07/29/2021   Demand ischemia (HCC)    Hypertensive urgency 07/28/2021   Protein calorie malnutrition, unspecified (HCC) 07/28/2021   Essential hypertension 07/27/2021   Hyperlipidemia 07/27/2021   Pernicious anemia 07/27/2021   Renal failure (ARF), acute on chronic (HCC) 07/27/2021  Acute hyponatremia 07/27/2021   Elevated troponin 07/27/2021   Acute metabolic encephalopathy 07/27/2021   Chronic insomnia 04/05/2017   Chronic kidney disease (CKD), stage IV (severe) (HCC) 12/25/2015   Anemia of chronic disease 07/20/2014   PCP:  Marguarite Arbour, MD Pharmacy:   [facility], [place] - [address] [place] [address] Phone: [phone] Fax: [phone]     Social Determinants of Health (SDOH) Social History: SDOH Screenings   Food Insecurity: Patient Unable To Answer (10/01/2023)  Housing: Patient Unable To Answer (10/01/2023)  Transportation Needs: Patient Unable To Answer (10/01/2023)  Utilities: Patient Unable To  Answer (10/01/2023)  Tobacco Use: Low Risk  (11/21/2023)   SDOH Interventions:     Readmission Risk Interventions    10/04/2023    2:38 PM 03/31/2022   11:40 AM 09/17/2021    9:37 AM  Readmission Risk Prevention Plan  Transportation Screening Complete Complete Complete  PCP or Specialist Appt within 3-5 Days Complete Complete   HRI or Home Care Consult Complete Complete   Social Work Consult for Recovery Care Planning/Counseling Complete Complete   Palliative Care Screening Complete Not Applicable   Medication Review Oceanographer) Complete Complete Complete  PCP or Specialist appointment within 3-5 days of discharge   Complete  HRI or Home Care Consult   Complete  SW Recovery Care/Counseling Consult   Not Complete  SW Consult Not Complete Comments   RNCM assigned to case  Palliative Care Screening   Not Applicable  Comments   na  Skilled Nursing Facility   Complete

## 2023-11-22 NOTE — Progress Notes (Signed)
Initial Nutrition Assessment  DOCUMENTATION CODES:   Underweight, Severe malnutrition in context of chronic illness  INTERVENTION:   -Obtain new wt -Liberalize diet to regular for widest variety of meal selections -MVI with minerals daily -Continue Ensure Enlive po BID, each supplement provides 350 kcal and 20 grams of protein -Magic cup TID with meals, each supplement provides 290 kcal and 9 grams of protein  -500 mg vitamin C BID -220 mg zinc sulfate daily x 14 days -Feeding assistance with meals  NUTRITION DIAGNOSIS:   Severe Malnutrition related to chronic illness (dementia) as evidenced by severe fat depletion, severe muscle depletion.  GOAL:   Patient will meet greater than or equal to 90% of their needs  MONITOR:   PO intake, Supplement acceptance  REASON FOR ASSESSMENT:   Consult Assessment of nutrition requirement/status  ASSESSMENT:   Pt with medical history significant of dCHF, HTN, dementia per her daughter, depression with anxiety, CKD-4, colon cancer (s/p colostomy and s/p of colectomy with a chronic large para colostomy hernia), who presents with shortness of breath and cough.  Pt admitted with acute respiratory disease secondary to COVID-19.   Reviewed I/O's: +824 ml x 24 hours  Pt lying in bed at time of visit. She did not awaken to voice or touch. No family or supports at bedside to provide further history.   Pt is currently on a heart healthy diet. No meal completion data available to assess at this time. Given pt's advanced age, dementia, and malnutrition, will liberalize diet for widest variety of meal selections to help optimize oral intake.   Reviewed MAR from [facility]. No significant medical or nutrition related history noted. Per medication orders, pt was not on supplements PTA.   Last recorded wt on 10/03/23. RD will obtain new wt to better assess wt changes, Noted distant history of weight loss.    Medications reviewed and include  solu-medrol and remeron.   Lab Results  Component Value Date   HGBA1C 5.6 07/28/2021   PTA DM medications are none.   Labs reviewed.   NUTRITION - FOCUSED PHYSICAL EXAM:  Flowsheet Row Most Recent Value  Orbital Region Severe depletion  Upper Arm Region Severe depletion  Thoracic and Lumbar Region Severe depletion  Buccal Region Severe depletion  Temple Region Severe depletion  Clavicle Bone Region Severe depletion  Clavicle and Acromion Bone Region Severe depletion  Scapular Bone Region Severe depletion  Dorsal Hand Severe depletion  Patellar Region Moderate depletion  Anterior Thigh Region Moderate depletion  Posterior Calf Region Moderate depletion  Edema (RD Assessment) Mild  Hair Reviewed  Eyes Reviewed  Mouth Reviewed  Skin Reviewed  Nails Reviewed       Diet Order:   Diet Order             Diet Heart Fluid consistency: Thin  Diet effective now                   EDUCATION NEEDS:   No education needs have been identified at this time  Skin:  Skin Assessment: Skin Integrity Issues: Skin Integrity Issues:: Stage II Stage II: sacrum  Last BM:  11/21/23  Height:   Ht Readings from Last 1 Encounters:  09/30/23 5\' 2"  (1.575 m)    Weight:   Wt Readings from Last 1 Encounters:  10/03/23 43.7 kg    Ideal Body Weight:  50 kg  BMI:  There is no height or weight on file to calculate BMI.  Estimated Nutritional  Needs:   Kcal:  1500-1700  Protein:  75-90 grams  Fluid:  > 1.5 L    Levada Schilling, RD, LDN, CDCES Registered Dietitian III Certified Diabetes Care and Education Specialist Please refer to [facility] for RD and/or RD on-call/weekend/after hours pager

## 2023-11-22 NOTE — Progress Notes (Signed)
Progress Note   Patient: Autumn Johnston DOB: Oct 12, 1922 DOA: 11/21/2023     1 DOS: the patient was seen and examined on 11/22/2023   Brief hospital course: Autumn Johnston is a 89 y.o. female with medical history significant of dCHF, HTN, dementia per her daughter, depression with anxiety, CKD-4, colon cancer (s/p colostomy and s/p of colectomy with a chronic large para colostomy hernia), who presents with SOB and cough. Tested positive for COVID. Cxray showed left basilar opacity admitted for COVID pneumonia.  Assessment and Plan: Pneumonia due to COVID-19 virus  Acute respiratory failure with hypoxia [facility]):  Patient is currently on4 L supplemental O2. Got a dose of rocephin, azithro. Continue bronchodilators Continue as needed Mucinex Solu-Medrol 40 mg twice daily Supplemental oxygen to maintain oxygen saturation above 93%   Chronic diastolic CHF (congestive heart failure) (HCC):  No CHF exacerbation. Avoid IV fluids. Encourage oral diet, fluids. Hold diuretics for now worsening renal function   Essential hypertension: Blood pressure 94/49 IV hydralazine as needed Hold amlodipine and Imdur due to softer blood pressure   Chronic kidney disease (CKD), stage IV (severe) [facility]):  Patient has worsening renal function.  Recent baseline creatinine 1.13 on 10/04/2023. Today cr 1.79. Avoid nephrotoxic drugs.   Dementia (HCC) Fall, aspiration precautions Delirium precautions. Supportive care.   Depression with anxiety as needed Xanax   Protein-calorie malnutrition, severe: Body weight 43.7 kg, BMI 17.4 Continue supplements, Ensure Nutrition consult  Nutrition Documentation    Flowsheet Row ED to Hosp-Admission (Current) from 11/21/2023 in [facility]  Nutrition Problem Severe Malnutrition  Etiology chronic illness  [dementia]  Nutrition Goal Patient will meet greater than or equal to 90% of their needs  Interventions Ensure Enlive  (each supplement provides 350kcal and 20 grams of protein), Magic cup, MVI, Liberalize Diet     ,  Active Pressure Injury/Wound(s)     Pressure Ulcer  Duration          Pressure Injury 08/09/23 Sacrum Stage 2 -  Partial thickness loss of dermis presenting as a shallow open injury with a red, pink wound bed without slough. skin tear to gluteal cleft 105 days           Out of bed to chair. Incentive spirometry. DVT prophylaxis   Code Status: Limited: Do not attempt resuscitation (DNR) -DNR-LIMITED -Do Not Intubate/DNI   Subjective: Patient is seen and examined today morning. She is unable to talk to me, has respiratory distress, cough. Did not eat. She is on 4L supplemental o2.  Physical Exam: Vitals:   11/22/23 0600 11/22/23 0800 11/22/23 1200 11/22/23 1336  BP:  (!) 127/50 (!) 85/54 120/65  Pulse:    76  Resp:  17 20 12   Temp: (!) 97.3 F (36.3 C) 97.8 F (36.6 C) 97.9 F (36.6 C)   TempSrc: Axillary Axillary Axillary   SpO2:  93% 94% 96%    General - Elderly ill Caucasian thin built female, moderate respiratory distress HEENT - PERRLA, EOMI, atraumatic head, non tender sinuses. Lung - Clear, basal rales, diffuse rhonchi, wheezes, using accessory muscles. Heart - S1, S2 heard, no murmurs, rubs, no pedal edema. Abdomen - Soft, non tender, bowel sounds good Neuro - sleepy, lethargic, unable to do full neuro exam Skin - Warm and dry.  Data Reviewed:      Latest Ref Rng & Units 11/22/2023    5:54 AM 11/21/2023    5:28 PM 10/04/2023    5:29 AM  CBC  WBC 4.0 - 10.5 K/uL 7.5  8.0  8.8   Hemoglobin 12.0 - 15.0 g/dL 30.8  65.7  84.6   Hematocrit 36.0 - 46.0 % 32.9  36.8  33.1   Platelets 150 - 400 K/uL 332  405  271       Latest Ref Rng & Units 11/22/2023    5:54 AM 11/21/2023    5:28 PM 10/04/2023    5:29 AM  BMP  Glucose 70 - 99 mg/dL 962  94  76   BUN 8 - 23 mg/dL 41  41  29   Creatinine 0.44 - 1.00 mg/dL 9.52  8.41  3.24   Sodium 135 - 145 mmol/L 136  134   135   Potassium 3.5 - 5.1 mmol/L 4.8  4.1  3.9   Chloride 98 - 111 mmol/L 107  102  97   CO2 22 - 32 mmol/L 21  22  27    Calcium 8.9 - 10.3 mg/dL 8.2  8.5  8.9    DG Chest Port 1 View  Result Date: 11/21/2023 CLINICAL DATA:  Cough. EXAM: PORTABLE CHEST 1 VIEW COMPARISON:  August 06, 2023. FINDINGS: Stable cardiomediastinal silhouette. Increased left basilar opacity is noted suggesting worsening atelectasis or infiltrate with associated effusion. Minimal right basilar subsegmental atelectasis is noted. Bony thorax is unremarkable. IMPRESSION: Increased left basilar opacity is noted suggesting worsening atelectasis or infiltrate with associated effusion. Electronically Signed   By: Lupita Raider M.D.   On: 11/21/2023 17:46     Family Communication: Discussed with daughter, she understand and agree. All questions answereed.   Disposition: Status is: Inpatient Remains inpatient appropriate because: COVID pneumonia  Planned Discharge Destination: Skilled nursing facility     Time spent: 42 minutes  Author: Marcelino Duster, MD 11/22/2023 2:51 PM Secure chat 7am to 7pm For on call review [email or web address].

## 2023-11-23 ENCOUNTER — Inpatient Hospital Stay: Payer: Medicare Other

## 2023-11-23 DIAGNOSIS — I5032 Chronic diastolic (congestive) heart failure: Secondary | ICD-10-CM | POA: Diagnosis not present

## 2023-11-23 DIAGNOSIS — I10 Essential (primary) hypertension: Secondary | ICD-10-CM | POA: Diagnosis not present

## 2023-11-23 DIAGNOSIS — U071 COVID-19: Secondary | ICD-10-CM | POA: Diagnosis not present

## 2023-11-23 DIAGNOSIS — J9601 Acute respiratory failure with hypoxia: Secondary | ICD-10-CM | POA: Diagnosis not present

## 2023-11-23 LAB — CBC
HCT: 29.1 % — ABNORMAL LOW (ref 36.0–46.0)
Hemoglobin: 9.6 g/dL — ABNORMAL LOW (ref 12.0–15.0)
MCH: 28.3 pg (ref 26.0–34.0)
MCHC: 33 g/dL (ref 30.0–36.0)
MCV: 85.8 fL (ref 80.0–100.0)
Platelets: 383 10*3/uL (ref 150–400)
RBC: 3.39 MIL/uL — ABNORMAL LOW (ref 3.87–5.11)
RDW: 14.5 % (ref 11.5–15.5)
WBC: 11.1 10*3/uL — ABNORMAL HIGH (ref 4.0–10.5)
nRBC: 0 % (ref 0.0–0.2)

## 2023-11-23 LAB — BASIC METABOLIC PANEL
Anion gap: 8 (ref 5–15)
BUN: 46 mg/dL — ABNORMAL HIGH (ref 8–23)
CO2: 19 mmol/L — ABNORMAL LOW (ref 22–32)
Calcium: 8 mg/dL — ABNORMAL LOW (ref 8.9–10.3)
Chloride: 108 mmol/L (ref 98–111)
Creatinine, Ser: 1.6 mg/dL — ABNORMAL HIGH (ref 0.44–1.00)
GFR, Estimated: 29 mL/min — ABNORMAL LOW (ref >60)
Glucose, Bld: 129 mg/dL — ABNORMAL HIGH (ref 70–99)
Potassium: 4.5 mmol/L (ref 3.5–5.1)
Sodium: 135 mmol/L (ref 135–145)

## 2023-11-23 NOTE — Plan of Care (Signed)

## 2023-11-23 NOTE — Progress Notes (Signed)
   11/23/23 0930  Charting Type  Charting Type Full Reassessment Changes Noted  Focused Reassessment No Changes Gastrointestinal  Gastrointestinal  Gastrointestinal (WDL) X  Abdomen Inspection Distended;Other (Comment) (hernia enlarged, primary team informed)  Bowel Sounds Assessment Hyperactive

## 2023-11-23 NOTE — TOC Progression Note (Addendum)
Transition of Care [facility]) - Progression Note    Patient Details  Name: Autumn Johnston MRN: [id number] Date of Birth:   Transition of Care [facility]) CM/SW Contact  Liliana Cline, LCSW Phone Number: 11/23/2023, 2:22 PM  Clinical Narrative:    Spoke with Gavin Pound at [facility]. Gavin Pound states patient is not long term care.  Per Gavin Pound, patient was getting STR at [facility] and Gavin Pound requests PT eval if MD feels it is appropriate. She states they would need a new PT recommendation and patient to make progress with PT to be able to come for more STR. If she returns and does not need rehab, Gavin Pound states she would be private pay.  Asked MD regarding PT eval being ordered.         Expected Discharge Plan and Services                                               Social Determinants of Health (SDOH) Interventions SDOH Screenings   Food Insecurity: Patient Unable To Answer (10/01/2023)  Housing: Patient Unable To Answer (10/01/2023)  Transportation Needs: Patient Unable To Answer (10/01/2023)  Utilities: Patient Unable To Answer (10/01/2023)  Tobacco Use: Low Risk  (11/21/2023)    Readmission Risk Interventions    10/04/2023    2:38 PM 03/31/2022   11:40 AM 09/17/2021    9:37 AM  Readmission Risk Prevention Plan  Transportation Screening Complete Complete Complete  PCP or Specialist Appt within 3-5 Days Complete Complete   HRI or Home Care Consult Complete Complete   Social Work Consult for Recovery Care Planning/Counseling Complete Complete   Palliative Care Screening Complete Not Applicable   Medication Review Oceanographer) Complete Complete Complete  PCP or Specialist appointment within 3-5 days of discharge   Complete  HRI or Home Care Consult   Complete  SW Recovery Care/Counseling Consult   Not Complete  SW Consult Not Complete Comments   RNCM assigned to case  Palliative Care Screening   Not Applicable  Comments   na  Skilled Nursing  Facility   Complete

## 2023-11-23 NOTE — Progress Notes (Signed)
Progress Note   Patient: Autumn Johnston ZOX:[id number] DOB: 1922/09/03 DOA: 11/21/2023     2 DOS: the patient was seen and examined on 11/23/2023   Brief hospital course: Autumn Johnston is a 89 y.o. female with medical history significant of dCHF, HTN, dementia per her daughter, depression with anxiety, CKD-4, colon cancer (s/p colostomy and s/p of colectomy with a chronic large para colostomy hernia), who presents with SOB and cough. Tested positive for COVID. Cxray showed left basilar opacity admitted for COVID pneumonia.  Assessment and Plan: Pneumonia due to COVID-19 virus  Acute respiratory failure with hypoxia (HCC):  O2 weaned to 4 L supplemental O2. Got a dose of rocephin, azithro in ED. Continue bronchodilators Continue as needed Mucinex. Continue isolation precautions. Solu-Medrol changed to 40 mg daily. Taper to prednisone tomorrow. Supplemental oxygen to maintain oxygen saturation above 92%.   Chronic diastolic CHF (congestive heart failure) (HCC):  No CHF exacerbation. Encourage oral diet, fluids. Hold diuretics for now worsening renal function   Essential hypertension: Blood pressure 94/49 IV hydralazine as needed Hold amlodipine and Imdur due to softer blood pressure   Acute on Chronic kidney disease (CKD), stage IV (severe) [facility]):  Patient has worsening renal function.  Recent baseline creatinine 1.13 on 10/04/2023. Today cr 1.60. Avoid nephrotoxic drugs.   Dementia (HCC) Pleasantly confused. Delirium precautions. Continue fall and aspiration precautions. Supportive care.   Depression with anxiety as needed Xanax.  Large para colostomy hernia - s/p colostomy and s/p of colectomy with a chronic large para colostomy hernia. Had CT abdomen 9/27, stable hernia, no evidence of obstruction. Size seems to be bigger than usual per RN. KUB ordered. I discussed with her daughter, wishes only conservative management.   Protein-calorie malnutrition, severe:  Body weight 43.7 kg, BMI 17.4 Continue supplements, Ensure  Nutrition Documentation    Flowsheet Row ED to Hosp-Admission (Current) from 11/21/2023 in [facility] PCU  Nutrition Problem Severe Malnutrition  Etiology chronic illness  [dementia]  Nutrition Goal Patient will meet greater than or equal to 90% of their needs  Interventions Ensure Enlive (each supplement provides 350kcal and 20 grams of protein), Magic cup, MVI, Liberalize Diet     ,  Active Pressure Injury/Wound(s)     Pressure Ulcer  Duration          Pressure Injury 08/09/23 Sacrum Stage 2 -  Partial thickness loss of dermis presenting as a shallow open injury with a red, pink wound bed without slough. skin tear to gluteal cleft 105 days           Out of bed to chair. Incentive spirometry. DVT prophylaxis   Code Status: Limited: Do not attempt resuscitation (DNR) -DNR-LIMITED -Do Not Intubate/DNI   Subjective: Patient is seen and examined today morning. She is more alert today, able to answer me. Has hard of hearing. Abdominal wall hernia bigger in size. Eating poor. She is on 3-4L supplemental o2.  Physical Exam: Vitals:   11/23/23 0400 11/23/23 0736 11/23/23 0800 11/23/23 1200  BP: (!) 122/59 (!) 129/59 120/63 (!) 127/91  Pulse: 67  79 91  Resp: (!) 25 15 (!) 37 (!) 37  Temp: 97.6 F (36.4 C) 98.6 F (37 C)    TempSrc: Axillary Axillary    SpO2: 96% 94% 95% (!) 87%    General - Elderly ill Caucasian thin built female, mild respiratory distress HEENT - PERRLA, EOMI, atraumatic head, non tender sinuses. Lung - Clear, basal rales, diffuse rhonchi, wheezes. Heart -  S1, S2 heard, no murmurs, rubs, no pedal edema. Abdomen - Soft, non tender, chronic large para colostomy hernia noted Neuro - sleepy, lethargic, unable to do full neuro exam Skin - Warm and dry.  Data Reviewed:      Latest Ref Rng & Units 11/23/2023    5:35 AM 11/22/2023    5:54 AM 11/21/2023    5:28 PM  CBC  WBC 4.0 -  10.5 K/uL 11.1  7.5  8.0   Hemoglobin 12.0 - 15.0 g/dL 9.6  13.0  86.5   Hematocrit 36.0 - 46.0 % 29.1  32.9  36.8   Platelets 150 - 400 K/uL 383  332  405       Latest Ref Rng & Units 11/23/2023    5:35 AM 11/22/2023    5:54 AM 11/21/2023    5:28 PM  BMP  Glucose 70 - 99 mg/dL 784  696  94   BUN 8 - 23 mg/dL 46  41  41   Creatinine 0.44 - 1.00 mg/dL 2.95  2.84  1.32   Sodium 135 - 145 mmol/L 135  136  134   Potassium 3.5 - 5.1 mmol/L 4.5  4.8  4.1   Chloride 98 - 111 mmol/L 108  107  102   CO2 22 - 32 mmol/L 19  21  22    Calcium 8.9 - 10.3 mg/dL 8.0  8.2  8.5    DG Chest Port 1 View  Result Date: 11/21/2023 CLINICAL DATA:  Cough. EXAM: PORTABLE CHEST 1 VIEW COMPARISON:  August 06, 2023. FINDINGS: Stable cardiomediastinal silhouette. Increased left basilar opacity is noted suggesting worsening atelectasis or infiltrate with associated effusion. Minimal right basilar subsegmental atelectasis is noted. Bony thorax is unremarkable. IMPRESSION: Increased left basilar opacity is noted suggesting worsening atelectasis or infiltrate with associated effusion. Electronically Signed   By: Lupita Raider M.D.   On: 11/21/2023 17:46     Family Communication: Discussed with daughter, she understand and agree. All questions answereed.   Disposition: Status is: Inpatient Remains inpatient appropriate because: COVID pneumonia  Planned Discharge Destination: Skilled nursing facility     Time spent: 40 minutes  Author: Marcelino Duster, MD 11/23/2023 2:27 PM Secure chat 7am to 7pm For on call review [email or web address].

## 2023-11-23 NOTE — Plan of Care (Signed)

## 2023-11-24 DIAGNOSIS — J069 Acute upper respiratory infection, unspecified: Secondary | ICD-10-CM | POA: Diagnosis not present

## 2023-11-24 DIAGNOSIS — U071 COVID-19: Secondary | ICD-10-CM | POA: Diagnosis not present

## 2023-11-24 LAB — CBC
HCT: 32.1 % — ABNORMAL LOW (ref 36.0–46.0)
Hemoglobin: 10.7 g/dL — ABNORMAL LOW (ref 12.0–15.0)
MCH: 28.4 pg (ref 26.0–34.0)
MCHC: 33.3 g/dL (ref 30.0–36.0)
MCV: 85.1 fL (ref 80.0–100.0)
Platelets: 430 10*3/uL — ABNORMAL HIGH (ref 150–400)
RBC: 3.77 MIL/uL — ABNORMAL LOW (ref 3.87–5.11)
RDW: 14.5 % (ref 11.5–15.5)
WBC: 10.6 10*3/uL — ABNORMAL HIGH (ref 4.0–10.5)
nRBC: 0.2 % (ref 0.0–0.2)

## 2023-11-24 LAB — BASIC METABOLIC PANEL
Anion gap: 9 (ref 5–15)
BUN: 48 mg/dL — ABNORMAL HIGH (ref 8–23)
CO2: 20 mmol/L — ABNORMAL LOW (ref 22–32)
Calcium: 8.4 mg/dL — ABNORMAL LOW (ref 8.9–10.3)
Chloride: 109 mmol/L (ref 98–111)
Creatinine, Ser: 1.54 mg/dL — ABNORMAL HIGH (ref 0.44–1.00)
GFR, Estimated: 30 mL/min — ABNORMAL LOW (ref >60)
Glucose, Bld: 120 mg/dL — ABNORMAL HIGH (ref 70–99)
Potassium: 3.9 mmol/L (ref 3.5–5.1)
Sodium: 138 mmol/L (ref 135–145)

## 2023-11-24 MED ORDER — GERHARDT'S BUTT CREAM
TOPICAL_CREAM | Freq: Two times a day (BID) | CUTANEOUS | Status: DC
  Administered 2023-11-28: 1 via TOPICAL
  Filled 2023-11-24: qty 60

## 2023-11-24 NOTE — Progress Notes (Signed)
Progress Note   Patient: Autumn Johnston IRJ:[id number] DOB: September 20, 1922 DOA: 11/21/2023     3 DOS: the patient was seen and examined on 11/24/2023    Brief hospital course: Autumn Johnston is a 89 y.o. female with medical history significant of dCHF, HTN, dementia per her daughter, depression with anxiety, CKD-4, colon cancer (s/p colostomy and s/p of colectomy with a chronic large para colostomy hernia), who presents with SOB and cough. Tested positive for COVID. Cxray showed left basilar opacity admitted for COVID pneumonia.   Assessment and Plan: Pneumonia due to COVID-19 virus  Acute respiratory failure with hypoxia (HCC):  O2 weaned to 4 L supplemental O2. Got a dose of rocephin, azithro in ED. Continue bronchodilators Continue as needed Mucinex. Continue isolation precautions. Continue Solu-Medrol changed to 40 mg daily. Taper to prednisone tomorrow. Continue supplemental oxygen to maintain oxygen saturation above 92%.   Chronic diastolic CHF (congestive heart failure) (HCC):  No CHF exacerbation. Encourage oral diet, fluids. Hold diuretics for now worsening renal function   Essential hypertension: Blood pressure 94/49 IV hydralazine as needed Continue to hold amlodipine and Imdur due to softer blood pressure   Acute on Chronic kidney disease (CKD), stage IV (severe) [facility]):  Patient has worsening renal function.  Recent baseline creatinine 1.13 on 10/04/2023. Today cr 1.60. Avoid nephrotoxic drugs.   Dementia (HCC) Pleasantly confused. Delirium precautions. Continue fall and aspiration precautions. Supportive care.   Depression with anxiety as needed Xanax.   Large para colostomy hernia - s/p colostomy and s/p of colectomy with a chronic large para colostomy hernia. Had CT abdomen 9/27, stable hernia, no evidence of obstruction. Size seems to be bigger than usual per RN. KUB ordered. I discussed with her daughter, wishes only conservative management.    Protein-calorie malnutrition, severe: Body weight 43.7 kg, BMI 17.4 Continue supplements, Ensure   Nutrition Documentation     Flowsheet Row ED to Hosp-Admission (Current) from 11/21/2023 in [facility]  Nutrition Problem Severe Malnutrition  Etiology chronic illness  [dementia]  Nutrition Goal Patient will meet greater than or equal to 90% of their needs  Interventions Ensure Enlive (each supplement provides 350kcal and 20 grams of protein), Magic cup, MVI, Liberalize Diet       ,  Active Pressure Injury/Wound(s)       Pressure Ulcer  Duration             Pressure Injury 08/09/23 Sacrum Stage 2 -  Partial thickness loss of dermis presenting as a shallow open injury with a red, pink wound bed without slough. skin tear to gluteal cleft 105 days                Subjective:  Patient seen and examined at bedside this morning Denies nausea vomiting abdominal pain chest pain Still has some generalized weakness PT OT has recommended rehab  Physical Exam:  General - Elderly ill Caucasian thin built female, mild respiratory distress HEENT - PERRLA, EOMI, atraumatic head, non tender sinuses. Lung - Clear, basal rales, diffuse rhonchi, wheezes. Heart - S1, S2 heard, no murmurs, rubs, no pedal edema. Abdomen - Soft, non tender, chronic large para colostomy hernia noted Neuro - sleepy, lethargic, unable to do full neuro exam Skin - Warm and dry.    Family Communication: Discussed with daughter, she understand and agree. All questions answereed.     Disposition: Status is: Inpatient Remains inpatient appropriate because: COVID pneumonia  Planned Discharge Destination: Skilled nursing facility  Time spent: 41 minutes  Data Reviewed: Vitals:   11/24/23 0400 11/24/23 0844 11/24/23 1306 11/24/23 1617  BP:  125/73 (!) 122/59 126/63  Pulse:  69    Resp:  20    Temp: 97.7 F (36.5 C) 97.6 F (36.4 C) 97.6 F (36.4 C)   TempSrc:  Oral Axillary   SpO2:   100%  94%  Weight:          Latest Ref Rng & Units 11/24/2023    6:17 AM 11/23/2023    5:35 AM 11/22/2023    5:54 AM  BMP  Glucose 70 - 99 mg/dL 865  784  696   BUN 8 - 23 mg/dL 48  46  41   Creatinine 0.44 - 1.00 mg/dL 2.95  2.84  1.32   Sodium 135 - 145 mmol/L 138  135  136   Potassium 3.5 - 5.1 mmol/L 3.9  4.5  4.8   Chloride 98 - 111 mmol/L 109  108  107   CO2 22 - 32 mmol/L 20  19  21    Calcium 8.9 - 10.3 mg/dL 8.4  8.0  8.2        Latest Ref Rng & Units 11/24/2023    6:17 AM 11/23/2023    5:35 AM 11/22/2023    5:54 AM  CBC  WBC 4.0 - 10.5 K/uL 10.6  11.1  7.5   Hemoglobin 12.0 - 15.0 g/dL 44.0  9.6  10.2   Hematocrit 36.0 - 46.0 % 32.1  29.1  32.9   Platelets 150 - 400 K/uL 430  383  332      Author: Loyce Dys, MD 11/24/2023 6:22 PM  For on call review [email or web address].

## 2023-11-24 NOTE — Plan of Care (Signed)
Progressing towards goals

## 2023-11-24 NOTE — Plan of Care (Signed)
  Problem: Nutrition: Goal: Adequate nutrition will be maintained Outcome: Progressing   Problem: Coping: Goal: Level of anxiety will decrease Outcome: Progressing   

## 2023-11-25 DIAGNOSIS — U071 COVID-19: Secondary | ICD-10-CM | POA: Diagnosis not present

## 2023-11-25 DIAGNOSIS — J069 Acute upper respiratory infection, unspecified: Secondary | ICD-10-CM | POA: Diagnosis not present

## 2023-11-25 LAB — BASIC METABOLIC PANEL
Anion gap: 8 (ref 5–15)
BUN: 51 mg/dL — ABNORMAL HIGH (ref 8–23)
CO2: 21 mmol/L — ABNORMAL LOW (ref 22–32)
Calcium: 8.3 mg/dL — ABNORMAL LOW (ref 8.9–10.3)
Chloride: 109 mmol/L (ref 98–111)
Creatinine, Ser: 1.46 mg/dL — ABNORMAL HIGH (ref 0.44–1.00)
GFR, Estimated: 32 mL/min — ABNORMAL LOW (ref >60)
Glucose, Bld: 119 mg/dL — ABNORMAL HIGH (ref 70–99)
Potassium: 4.2 mmol/L (ref 3.5–5.1)
Sodium: 138 mmol/L (ref 135–145)

## 2023-11-25 LAB — CBC WITH DIFFERENTIAL/PLATELET
Abs Immature Granulocytes: 0.07 10*3/uL (ref 0.00–0.07)
Basophils Absolute: 0 10*3/uL (ref 0.0–0.1)
Basophils Relative: 0 %
Eosinophils Absolute: 0 10*3/uL (ref 0.0–0.5)
Eosinophils Relative: 0 %
HCT: 31.8 % — ABNORMAL LOW (ref 36.0–46.0)
Hemoglobin: 10.4 g/dL — ABNORMAL LOW (ref 12.0–15.0)
Immature Granulocytes: 1 %
Lymphocytes Relative: 7 %
Lymphs Abs: 0.5 10*3/uL — ABNORMAL LOW (ref 0.7–4.0)
MCH: 28.3 pg (ref 26.0–34.0)
MCHC: 32.7 g/dL (ref 30.0–36.0)
MCV: 86.6 fL (ref 80.0–100.0)
Monocytes Absolute: 0.4 10*3/uL (ref 0.1–1.0)
Monocytes Relative: 5 %
Neutro Abs: 6.6 10*3/uL (ref 1.7–7.7)
Neutrophils Relative %: 87 %
Platelets: 377 10*3/uL (ref 150–400)
RBC: 3.67 MIL/uL — ABNORMAL LOW (ref 3.87–5.11)
RDW: 14.5 % (ref 11.5–15.5)
WBC: 7.6 10*3/uL (ref 4.0–10.5)
nRBC: 0 % (ref 0.0–0.2)

## 2023-11-25 MED ORDER — PREDNISONE 10 MG PO TABS: 10.0000 mg | ORAL_TABLET | Freq: Every day | ORAL | Status: DC

## 2023-11-25 MED ORDER — PREDNISONE 20 MG PO TABS
40.0000 mg | ORAL_TABLET | Freq: Every day | ORAL | Status: AC
  Administered 2023-11-27: 40 mg via ORAL
  Filled 2023-11-25 (×3): qty 2

## 2023-11-25 MED ORDER — PREDNISONE 20 MG PO TABS
20.0000 mg | ORAL_TABLET | Freq: Every day | ORAL | Status: AC
  Administered 2023-11-28 – 2023-11-29 (×2): 20 mg via ORAL
  Filled 2023-11-25 (×2): qty 1

## 2023-11-25 MED ORDER — PREDNISONE 10 MG PO TABS
10.0000 mg | ORAL_TABLET | Freq: Every day | ORAL | Status: AC
  Administered 2023-11-30 – 2023-12-01 (×2): 10 mg via ORAL
  Filled 2023-11-25 (×2): qty 1

## 2023-11-25 NOTE — Evaluation (Signed)
Occupational Therapy Evaluation Patient Details Name: Autumn Johnston MRN: [id number] DOB: Dec 14, 1922 Today's Date: 11/25/2023   History of Present Illness Autumn Johnston is a 89 y.o. female with medical history significant of dCHF, HTN, dementia per her daughter, depression with anxiety, CKD-4, colon cancer (s/p colostomy and s/p of colectomy with a chronic large para colostomy hernia), who presents with SOB and cough.     Per her daughter (I called her daughter by phone), patient started coughing with shortness of breath since Friday, which has been progressively worsening, mostly with dry cough.  Patient denies chest pain.  No fever or chills.  No nausea, vomiting, diarrhea or abdominal pain.  Patient is not using oxygen normally, but came in with 4L of oxygen with 97%, which desaturated to 80% when trying to decrease to 2 L oxygen in ED. When I saw patient on floor, patient is requiring 4-6 L oxygen, with tachypnea and moderate acute respiratory distress. She has difficult speaking in full sentence.  Her voice is hoarse.  Denies symptoms of UTI.   Clinical Impression   Patient received for OT evaluation. See flowsheet below for details of function. Generally, patient requiring visual cues and CGA for bed mobility, MIN A for sit to stand t/f and sidesteps, and overall MIN-MAX A for ADLs. Pt limited by decreased communication/cognition; follows visual cues well, but perseverative on her birthday. Patient will benefit from continued OT while in acute care.        If plan is discharge home, recommend the following: A lot of help with bathing/dressing/bathroom;A little help with walking and/or transfers;Assistance with cooking/housework;Direct supervision/assist for medications management;Direct supervision/assist for financial management;Assist for transportation;Help with stairs or ramp for entrance;Supervision due to cognitive status;Assistance with feeding    Functional Status  Assessment  Patient has had a recent decline in their functional status and demonstrates the ability to make significant improvements in function in a reasonable and predictable amount of time.  Equipment Recommendations  Other (comment) (defer to next venue of care)    Recommendations for Other Services       Precautions / Restrictions Precautions Precautions: Fall;Other (comment) (isolation: airborne) Precaution Comments: watch OT sats Restrictions Weight Bearing Restrictions: No      Mobility Bed Mobility Overal bed mobility: Needs Assistance Bed Mobility: Supine to Sit, Sit to Supine     Supine to sit: Contact guard, HOB elevated Sit to supine: Supervision   General bed mobility comments: visual cues and tactile cues; pt moving slowly, but able to t/f without assistance    Transfers Overall transfer level: Needs assistance Equipment used: Rolling walker (2 wheels) Transfers: Sit to/from Stand Sit to Stand: Min assist           General transfer comment: Needing strong gesture cues for transfer. Sidestepped to the L along EOB slowly and with significant cues, CGA. Unsafe to mobilize away from bed with only one person and no +2 available. Pt with increasing RR and fatigue with standing. OT removed soiled brief (urine) when pt standing. Able to stand/sidestep for approx 1 minute.      Balance Overall balance assessment: Needs assistance Sitting-balance support: Feet supported Sitting balance-Leahy Scale: Good     Standing balance support: Bilateral upper extremity supported, Reliant on assistive device for balance Standing balance-Leahy Scale: Poor                             ADL either performed or assessed  with clinical judgement   ADL Overall ADL's : Needs assistance/impaired     Grooming: Minimal assistance;Sitting (edge of bed) Grooming Details (indicate cue type and reason): Needing cues for taking drink to spit/rinse. Took extra time to open  toothpaste container; OT assisted to put toothpaste on.                             Functional mobility during ADLs: Minimal assistance;Rolling walker (2 wheels) (sidesteps only) General ADL Comments: Pt appearing generally confused and weak, although did know what to do with toothbrush and with glasses when handed to her. Anticipate will require MOD-MAX A for most ADLs.     Vision         Perception         Praxis         Pertinent Vitals/Pain Pain Assessment Pain Assessment: PAINAD Breathing: normal Negative Vocalization: none Facial Expression: sad, frightened, frown Body Language: tense, distressed pacing, fidgeting Consolability: no need to console PAINAD Score: 2 Pain Intervention(s): Repositioned     Extremity/Trunk Assessment Upper Extremity Assessment Upper Extremity Assessment: Generalized weakness   Lower Extremity Assessment Lower Extremity Assessment: Generalized weakness   Cervical / Trunk Assessment Cervical / Trunk Assessment: Normal   Communication Communication Communication: Hearing impairment   Cognition Arousal: Alert Behavior During Therapy: Anxious, Restless, Flat affect Overall Cognitive Status: No family/caregiver present to determine baseline cognitive functioning                                 General Comments: Pt perseverative on numbers; appearing anxious; appears to be trying to remember her birthday, occasionally saying "I have to remember". Unable to tell OT her name or any other verbal information. Follows visual cues well, but not verbal cues. Antiicpate limited ability to learn new information.     General Comments  Found on room air semi-reclined in bed, 93% saturation; BP in semi-reclined 118/73. O2 fluctuating with activity to 88-91%. RR increasing to 40. Pt appearing anxious throughout session; appearing to be trying to remember her birthday, stating "[id number] I need to remember."     Exercises     Shoulder Instructions      Home Living Family/patient expects to be discharged to:: Skilled nursing facility                                 Additional Comments: Was in STR at [facility] prior to admission. Before that was living at home with alternating assistance from adult children.      Prior Functioning/Environment Prior Level of Function : Needs assist             Mobility Comments: Unclear how pt was mobilizing prior to admission. ADLs Comments: Unclear how pt was functioning prior to admission; pt unable to provide any hx and no family in room.        OT Problem List: Decreased strength;Decreased activity tolerance;Impaired balance (sitting and/or standing);Decreased cognition;Decreased safety awareness;Decreased knowledge of use of DME or AE;Cardiopulmonary status limiting activity      OT Treatment/Interventions: Self-care/ADL training;Therapeutic exercise;Therapeutic activities;Patient/family education    OT Goals(Current goals can be found in the care plan section) Acute Rehab OT Goals Patient Stated Goal: unable OT Goal Formulation: Patient unable to participate in goal setting Time For Goal Achievement: 12/09/23 Potential to  Achieve Goals: Fair ADL Goals Pt Will Perform Grooming: with modified independence;sitting Pt Will Perform Lower Body Dressing: with min assist;sit to/from stand Pt Will Transfer to Toilet: with supervision;bedside commode Pt Will Perform Toileting - Clothing Manipulation and hygiene: with min assist;sit to/from stand  OT Frequency: Min 1X/week    Co-evaluation              AM-PAC OT "6 Clicks" Daily Activity     Outcome Measure Help from another person eating meals?: A Little Help from another person taking care of personal grooming?: A Little Help from another person toileting, which includes using toliet, bedpan, or urinal?: A Lot Help from another person bathing (including washing, rinsing,  drying)?: A Lot Help from another person to put on and taking off regular upper body clothing?: A Little Help from another person to put on and taking off regular lower body clothing?: A Lot 6 Click Score: 15   End of Session Equipment Utilized During Treatment: Rolling walker (2 wheels) Nurse Communication: Mobility status  Activity Tolerance: Patient limited by fatigue Patient left: in bed;with call bell/phone within reach;with bed alarm set  OT Visit Diagnosis: Unsteadiness on feet (R26.81)                Time: 8119-1478 OT Time Calculation (min): 24 min Charges:  OT General Charges $OT Visit: 1 Visit OT Evaluation $OT Eval Moderate Complexity: 1 Mod OT Treatments $Self Care/Home Management : 8-22 mins  Linward Foster, MS, OTR/L  Alvester Morin 11/25/2023, 11:45 AM

## 2023-11-25 NOTE — Evaluation (Signed)
Physical Therapy Evaluation Patient Details Name: Autumn Johnston MRN: [id number] DOB: June 13, 1922 Today's Date: 11/25/2023  History of Present Illness  Autumn Johnston is a 89 y.o. female with medical history significant of dCHF, HTN, dementia per her daughter, depression with anxiety, CKD-4, colon cancer (s/p colostomy and s/p of colectomy with a chronic large para colostomy hernia), who presents with SOB and cough.     Per her daughter (I called her daughter by phone), patient started coughing with shortness of breath since Friday, which has been progressively worsening, mostly with dry cough.  Patient denies chest pain.  No fever or chills.  No nausea, vomiting, diarrhea or abdominal pain.  Patient is not using oxygen normally, but came in with 4L of oxygen with 97%, which desaturated to 80% when trying to decrease to 2 L oxygen in ED. When I saw patient on floor, patient is requiring 4-6 L oxygen, with tachypnea and moderate acute respiratory distress. She has difficult speaking in full sentence.  Her voice is hoarse.  Denies symptoms of UTI.   Clinical Impression  Pt received in bed  and appeared in distress. Pt has decreased clarity with speech and is difficult to comprehend. Pt performed bed mobility CGA and STS with the use of RW (2wheels) 2xMinA. VC necessary throughout session to assist in providing comfort for pt as pt presented extremely anxious. Pt's [place] dislodged during session, RN notified. Due to fatigue and anxiety pt declined further mobility and finished session back in bed for safety purposes.   Pt did not tolerate Tx well due to emotional/psychological factors that inhibited continued/progressive mobility. Pt will continue to benefit from skilled PT sessions to improve strength, activity tolerance, and functional mobility to maximize safety/IND following D/C. Based on Hx and pt performance, pt will be placed on a 3 Session PT Trial to further assess if she can tolerate PT,  make progress, and demonstrate retention between sessions.         If plan is discharge home, recommend the following: A little help with walking and/or transfers;Assist for transportation;Help with stairs or ramp for entrance   Can travel by private vehicle        Equipment Recommendations Other (comment) (TBD)  Recommendations for Other Services       Functional Status Assessment Patient has had a recent decline in their functional status and/or demonstrates limited ability to make significant improvements in function in a reasonable and predictable amount of time     Precautions / Restrictions Precautions Precautions: Fall;Other (comment) (Isolation/Airborne) Precaution Comments: watch OT sats Restrictions Weight Bearing Restrictions: No      Mobility  Bed Mobility Overal bed mobility: Needs Assistance Bed Mobility: Supine to Sit, Sit to Supine     Supine to sit: Contact guard, HOB elevated Sit to supine: Contact guard assist, HOB elevated   General bed mobility comments: visual cues and tactile cues; pt moving slowly, but able to t/f without assistance    Transfers Overall transfer level: Needs assistance Equipment used: Rolling walker (2 wheels) Transfers: Sit to/from Stand Sit to Stand: Min assist           General transfer comment: Pt performed STS with the use of RW (2wheels) MinA and did not report any s/sx relative to dizziness when standing.    Ambulation/Gait               General Gait Details: deferred  Art therapist  Mobility     Tilt Bed    Modified Rankin (Stroke Patients Only)       Balance Overall balance assessment: Needs assistance Sitting-balance support: Feet supported Sitting balance-Leahy Scale: Good     Standing balance support: Bilateral upper extremity supported, Reliant on assistive device for balance Standing balance-Leahy Scale: Poor                               Pertinent  Vitals/Pain Pain Assessment Pain Assessment: Faces Faces Pain Scale: Hurts a little bit Pain Location: Pt unable to clearly state where pain is coming from Pain Intervention(s): Monitored during session, Repositioned    Home Living Family/patient expects to be discharged to:: Skilled nursing facility                   Additional Comments: Was in STR at [facility] prior to admission. Before that was living at home with alternating assistance from adult children.    Prior Function Prior Level of Function : Needs assist             Mobility Comments: Unclear how pt was mobilizing prior to admission. ADLs Comments: Unclear how pt was functioning prior to admission; pt unable to provide any hx and no family in room.     Extremity/Trunk Assessment   Upper Extremity Assessment Upper Extremity Assessment: Generalized weakness    Lower Extremity Assessment Lower Extremity Assessment: Generalized weakness       Communication   Communication Communication: Hearing impairment Cueing Techniques: Verbal cues;Tactile cues  Cognition Arousal: Alert Behavior During Therapy: Anxious, Restless Overall Cognitive Status: No family/caregiver present to determine baseline cognitive functioning                                 General Comments: Pt appeared distress upon arrival. WIlling to participate in PT session, however anxiety present throughout session. VC necessary to assist in calming pt down as she began to breathe heavy as anxiety increased.        General Comments      Exercises     Assessment/Plan    PT Assessment Patient needs continued PT services  PT Problem List Decreased strength;Decreased activity tolerance;Decreased balance;Decreased mobility       PT Treatment Interventions Gait training;Functional mobility training;Therapeutic activities;Therapeutic exercise;Balance training    PT Goals (Current goals can be found in the Care Plan section)   Acute Rehab PT Goals Patient Stated Goal: Pt unable to participate in Goal setting PT Goal Formulation: Patient unable to participate in goal setting Time For Goal Achievement: 12/09/23 Potential to Achieve Goals: Fair    Frequency Min 1X/week     Co-evaluation               AM-PAC PT "6 Clicks" Mobility  Outcome Measure Help needed turning from your back to your side while in a flat bed without using bedrails?: None Help needed moving from lying on your back to sitting on the side of a flat bed without using bedrails?: A Little Help needed moving to and from a bed to a chair (including a wheelchair)?: A Little Help needed standing up from a chair using your arms (e.g., wheelchair or bedside chair)?: A Little Help needed to walk in hospital room?: A Little Help needed climbing 3-5 steps with a railing? : A Lot 6 Click Score: 18  End of Session   Activity Tolerance: Patient limited by fatigue (Limited by anxiety) Patient left: in bed;with call bell/phone within reach;with bed alarm set Nurse Communication: Mobility status PT Visit Diagnosis: Unsteadiness on feet (R26.81);Other abnormalities of gait and mobility (R26.89);Muscle weakness (generalized) (M62.81);Difficulty in walking, not elsewhere classified (R26.2)    Time: 1610-9604 PT Time Calculation (min) (ACUTE ONLY): 28 min   Charges:   PT Evaluation $PT Eval Low Complexity: 1 Low   PT General Charges $$ ACUTE PT VISIT: 1 Visit         Cortne Amara Sauvignon Howard SPT, LAT, ATC  Kharlie Bring Sauvignon-Howard 11/25/2023, 3:17 PM

## 2023-11-25 NOTE — Plan of Care (Signed)
Progressing towards goals

## 2023-11-25 NOTE — Plan of Care (Signed)
Comments  NT tried to give small bite of pancakes and milk - both resulted in extreme coughing.   RN then attempted chrushed meds with applesauce - resulted in WET cough, even with extreme high aspiration precautions used.  Dr notified, Dist changed to NPO for SLP to assess for safety.

## 2023-11-25 NOTE — Progress Notes (Signed)
Progress Note   Patient: Autumn Johnston WUJ:[id number] DOB: 06-20-1922 DOA: 11/21/2023     4 DOS: the patient was seen and examined on 11/25/2023    Brief hospital course: Umeko Gwiazdowski is a 89 y.o. female with medical history significant of dCHF, HTN, dementia per her daughter, depression with anxiety, CKD-4, colon cancer (s/p colostomy and s/p of colectomy with a chronic large para colostomy hernia), who presents with SOB and cough. Tested positive for COVID. Cxray showed left basilar opacity admitted for COVID pneumonia.   Assessment and Plan: Pneumonia due to COVID-19 virus  Acute respiratory failure with hypoxia (HCC):  O2 weaned to 4 L supplemental O2. Got a dose of rocephin, azithro in ED. Continue bronchodilators Continue as needed Mucinex. Continue isolation precautions. Solu-Medrol has been switched to prednisone taper Continue supplemental oxygen to maintain oxygen saturation above 92%.   Possible aspiration event Made n.p.o. Speech therapist consulted  Chronic diastolic CHF (congestive heart failure) (HCC):  No CHF exacerbation. Encourage oral diet, fluids. Hold diuretics for now worsening renal function   Essential hypertension: Blood pressure 94/49 IV hydralazine as needed Continue to hold amlodipine and Imdur due to softer blood pressure   Acute on Chronic kidney disease (CKD), stage IV (severe) [facility]):  Patient has worsening renal function.  Recent baseline creatinine 1.13 on 10/04/2023. Today cr 1.60. Avoid nephrotoxic drugs.   Dementia (HCC) Pleasantly confused. Delirium precautions. Continue fall and aspiration precautions. Supportive care.   Depression with anxiety as needed Xanax.   Large para colostomy hernia - s/p colostomy and s/p of colectomy with a chronic large para colostomy hernia. Had CT abdomen 9/27, stable hernia, no evidence of obstruction. Size seems to be bigger than usual per RN. KUB ordered. I discussed with her daughter,  wishes only conservative management.   Protein-calorie malnutrition, severe: Body weight 43.7 kg, BMI 17.4 Continue supplements, Ensure   Nutrition Documentation     Flowsheet Row ED to Hosp-Admission (Current) from 11/21/2023 in [facility]  Nutrition Problem Severe Malnutrition  Etiology chronic illness  [dementia]  Nutrition Goal Patient will meet greater than or equal to 90% of their needs  Interventions Ensure Enlive (each supplement provides 350kcal and 20 grams of protein), Magic cup, MVI, Liberalize Diet       ,  Active Pressure Injury/Wound(s)       Pressure Ulcer  Duration             Pressure Injury 08/09/23 Sacrum Stage 2 -  Partial thickness loss of dermis presenting as a shallow open injury with a red, pink wound bed without slough. skin tear to gluteal cleft 105 days                Subjective:  Patient seen and examined at bedside this morning Denies nausea vomiting abdominal pain chest pain Still has some generalized weakness PT OT has recommended rehab   Physical Exam:   General - Elderly ill Caucasian thin built female, mild respiratory distress HEENT - PERRLA, EOMI, atraumatic head, non tender sinuses. Lung - Clear, basal rales, diffuse rhonchi, wheezes. Heart - S1, S2 heard, no murmurs, rubs, no pedal edema. Abdomen - Soft, non tender, chronic large para colostomy hernia noted Neuro - sleepy, lethargic, unable to do full neuro exam Skin - Warm and dry.     Family Communication: Discussed with daughter, she understand and agree. All questions answereed.     Disposition: Status is: Inpatient Remains inpatient appropriate because: COVID pneumonia  Planned  Discharge Destination: Skilled nursing facility     Time spent: 40 minutes   Data Reviewed:    Latest Ref Rng & Units 11/25/2023    5:00 AM 11/24/2023    6:17 AM 11/23/2023    5:35 AM  BMP  Glucose 70 - 99 mg/dL 664  403  474   BUN 8 - 23 mg/dL 51  48  46   Creatinine  0.44 - 1.00 mg/dL 2.59  5.63  8.75   Sodium 135 - 145 mmol/L 138  138  135   Potassium 3.5 - 5.1 mmol/L 4.2  3.9  4.5   Chloride 98 - 111 mmol/L 109  109  108   CO2 22 - 32 mmol/L 21  20  19    Calcium 8.9 - 10.3 mg/dL 8.3  8.4  8.0        Latest Ref Rng & Units 11/25/2023    5:00 AM 11/24/2023    6:17 AM 11/23/2023    5:35 AM  CBC  WBC 4.0 - 10.5 K/uL 7.6  10.6  11.1   Hemoglobin 12.0 - 15.0 g/dL 64.3  32.9  9.6   Hematocrit 36.0 - 46.0 % 31.8  32.1  29.1   Platelets 150 - 400 K/uL 377  430  383     Vitals:   11/25/23 0342 11/25/23 0400 11/25/23 0746 11/25/23 1215  BP:   (!) 145/73   Pulse:      Resp:      Temp:  97.8 F (36.6 C) 97.8 F (36.6 C) 97.6 F (36.4 C)  TempSrc:  Oral Oral   SpO2:      Weight: 42.3 kg        Author: Loyce Dys, MD 11/25/2023 6:05 PM  For on call review [email or web address].

## 2023-11-25 NOTE — Plan of Care (Signed)
  Problem: Respiratory: Goal: Will maintain a patent airway Outcome: Progressing   Problem: Clinical Measurements: Goal: Will remain free from infection Outcome: Progressing   Problem: Clinical Measurements: Goal: Cardiovascular complication will be avoided Outcome: Progressing   Problem: Elimination: Goal: Will not experience complications related to urinary retention Outcome: Progressing   Problem: Pain Management: Goal: General experience of comfort will improve Outcome: Progressing   Problem: Safety: Goal: Ability to remain free from injury will improve Outcome: Progressing

## 2023-11-26 ENCOUNTER — Other Ambulatory Visit: Payer: Self-pay

## 2023-11-26 DIAGNOSIS — J069 Acute upper respiratory infection, unspecified: Secondary | ICD-10-CM | POA: Diagnosis not present

## 2023-11-26 DIAGNOSIS — U071 COVID-19: Secondary | ICD-10-CM | POA: Diagnosis not present

## 2023-11-26 LAB — CBC WITH DIFFERENTIAL/PLATELET
Abs Immature Granulocytes: 0.25 10*3/uL — ABNORMAL HIGH (ref 0.00–0.07)
Basophils Absolute: 0 10*3/uL (ref 0.0–0.1)
Basophils Relative: 0 %
Eosinophils Absolute: 0 10*3/uL (ref 0.0–0.5)
Eosinophils Relative: 0 %
HCT: 35.3 % — ABNORMAL LOW (ref 36.0–46.0)
Hemoglobin: 11.6 g/dL — ABNORMAL LOW (ref 12.0–15.0)
Immature Granulocytes: 3 %
Lymphocytes Relative: 7 %
Lymphs Abs: 0.6 10*3/uL — ABNORMAL LOW (ref 0.7–4.0)
MCH: 27.9 pg (ref 26.0–34.0)
MCHC: 32.9 g/dL (ref 30.0–36.0)
MCV: 84.9 fL (ref 80.0–100.0)
Monocytes Absolute: 0.7 10*3/uL (ref 0.1–1.0)
Monocytes Relative: 7 %
Neutro Abs: 7.7 10*3/uL (ref 1.7–7.7)
Neutrophils Relative %: 83 %
Platelets: 458 10*3/uL — ABNORMAL HIGH (ref 150–400)
RBC: 4.16 MIL/uL (ref 3.87–5.11)
RDW: 14.6 % (ref 11.5–15.5)
WBC: 9.3 10*3/uL (ref 4.0–10.5)
nRBC: 0 % (ref 0.0–0.2)

## 2023-11-26 LAB — BASIC METABOLIC PANEL
Anion gap: 12 (ref 5–15)
BUN: 55 mg/dL — ABNORMAL HIGH (ref 8–23)
CO2: 22 mmol/L (ref 22–32)
Calcium: 8.5 mg/dL — ABNORMAL LOW (ref 8.9–10.3)
Chloride: 110 mmol/L (ref 98–111)
Creatinine, Ser: 1.61 mg/dL — ABNORMAL HIGH (ref 0.44–1.00)
GFR, Estimated: 29 mL/min — ABNORMAL LOW (ref >60)
Glucose, Bld: 94 mg/dL (ref 70–99)
Potassium: 3.8 mmol/L (ref 3.5–5.1)
Sodium: 144 mmol/L (ref 135–145)

## 2023-11-26 MED ORDER — ENSURE ENLIVE PO LIQD
237.0000 mL | Freq: Three times a day (TID) | ORAL | Status: DC
  Administered 2023-11-27 – 2023-12-08 (×20): 237 mL via ORAL

## 2023-11-26 NOTE — Progress Notes (Signed)
Nutrition Follow-up  DOCUMENTATION CODES:   Underweight, Severe malnutrition in context of chronic illness  INTERVENTION:   -Continue MVI with minerals daily -Increase Ensure Enlive po to TID, each supplement provides 350 kcal and 20 grams of protein -Continue Magic cup TID with meals, each supplement provides 290 kcal and 9 grams of protein  -Continue 500 mg vitamin C BID -Continue 220 mg zinc sulfate daily x 14 days -Continue feeding assistance with meals  NUTRITION DIAGNOSIS:   Severe Malnutrition related to chronic illness (dementia) as evidenced by severe fat depletion, severe muscle depletion.  Ongoing  GOAL:   Patient will meet greater than or equal to 90% of their needs  Progressing   MONITOR:   PO intake, Supplement acceptance  REASON FOR ASSESSMENT:   Consult Assessment of nutrition requirement/status  ASSESSMENT:   Pt with medical history significant of dCHF, HTN, dementia per her daughter, depression with anxiety, CKD-4, colon cancer (s/p colostomy and s/p of colectomy with a chronic large para colostomy hernia), who presents with shortness of breath and cough.  12/6- s/p BSE- dysphagia 1 diet with thin liquids  Reviewed I/O's: -650 ml x 24 hours and -1.4 L since admission  UOP: 650 ml x 24 hours   Pt lying in bed at time of visit. SHe did not respond to voice or touch. No family present.   SLP saw pt and downgraded diet to dysphagia 1 diet. Per SLP, pt liked Magic Cups.   RD would not recommend alternative means of nutrition and hydration due to advanced age and dementia.   Noted wt loss since admission.   Per TOC notes, plan to return to SNF [facility]) at discharge.   Medications reviewed and include vitamin C, remeron, prednisone, and zinc sulfate.   Labs reviewed.    Diet Order:   Diet Order             DIET - DYS 1 Room service appropriate? Yes; Fluid consistency: Thin  Diet effective now                   EDUCATION  NEEDS:   No education needs have been identified at this time  Skin:  Skin Assessment: Skin Integrity Issues: Skin Integrity Issues:: Stage II Stage II: sacrum  Last BM:  11/24/23 (via colostomy)  Height:   Ht Readings from Last 1 Encounters:  11/26/23 5\' 2"  (1.575 m)    Weight:   Wt Readings from Last 1 Encounters:  11/26/23 38.6 kg    Ideal Body Weight:  50 kg  BMI:  Body mass index is 15.56 kg/m.  Estimated Nutritional Needs:   Kcal:  1500-1700  Protein:  75-90 grams  Fluid:  > 1.5 L    Levada Schilling, RD, LDN, CDCES Registered Dietitian III Certified Diabetes Care and Education Specialist Please refer to [facility] for RD and/or RD on-call/weekend/after hours pager

## 2023-11-26 NOTE — Evaluation (Addendum)
Clinical/Bedside Swallow Evaluation Patient Details  Name: Autumn Johnston MRN: [id number] Date of Birth: 1922/08/26  Today's Date: 11/26/2023 Time: SLP Start Time (ACUTE ONLY): 1026 SLP Stop Time (ACUTE ONLY): 1100 SLP Time Calculation (min) (ACUTE ONLY): 34 min  Past Medical History:  Past Medical History:  Diagnosis Date   CKD (chronic kidney disease) stage 4, GFR 15-29 ml/min (HCC)    Colon cancer (HCC)    s/p colostomy   Dementia (HCC)    Depression    Hypertension    Parastomal hernia    Past Surgical History:  Past Surgical History:  Procedure Laterality Date   COLECTOMY WITH COLOSTOMY CREATION/HARTMANN PROCEDURE     INTRAMEDULLARY (IM) NAIL INTERTROCHANTERIC Left 09/08/2021   Procedure: INTRAMEDULLARY (IM) NAIL INTERTROCHANTRIC;  Surgeon: Signa Kell, MD;  Location: [facility] ORS;  Service: Orthopedics;  Laterality: Left;   HPI:  89yo female admitted from [facility] 11/21/23 with weakness/COVID. PMH: CKD4, colon cancer, HTN, dementia, anxiety, depression. CXR = LLL opacity    Assessment / Plan / Recommendation  Clinical Impression  Pt presents with cognitively based dysphagia. She is missing dentition and her oral cavity was noted to be dry and cracked. Pt tried to complete oral care after set up, but would not accept assistance. Pt unable to follow commands for CN exam, however, no obvious asymmetry observed. Speech is intelligible, however, suspect generalized weakness. Voice quality clear.  Pt accepted trials of thin liquid via cup and straw, taking large consecutive boluses. No overt s/s aspiration, wet voice quality, or change in WOB observed. Pt tolerated presentations of puree and softened graham cracker, exhibiting extended oral prep of solids. She tends to talk incessantly with food in the mouth, and does not follow directions to refrain from talking. This increases risk of aspiration of poorly chewed solid textures.   Pt frequently calling out for either Richard  or Boneta Lucks during this evaluation, and required frequent redirection and encouragement. Slight agitation/irritation present throughout. Pt mentation is likely to be variable throughout the day.   At this time, a puree diet with thin liquids is recommended to maximize safety. Meds crushed in puree. Pt enjoyed vanilla magic cup during this assessment - rec consult with RD to assist with supplements. Safe swallow precautions posted at [facility]. RN/MD informed of results and recommendations. SLP will follow to assess diet tolerance and determine appropriateness to return to pre-admit diet of Dys 2 (finely chopped) solids.  SLP Visit Diagnosis: Dysphagia, unspecified (R13.10)    Aspiration Risk  Moderate aspiration risk;Risk for inadequate nutrition/hydration    Diet Recommendation Dysphagia 1 (Puree);Thin liquid    Liquid Administration via: Straw;Cup Medication Administration: Crushed with puree Supervision: Patient able to self feed;Staff to assist with self feeding;Full supervision/cueing for compensatory strategies Compensations: Minimize environmental distractions;Slow rate;Small sips/bites Postural Changes: Seated upright at 90 degrees;Remain upright for at least 30 minutes after po intake    Other  Recommendations Oral Care Recommendations: Oral care BID    Recommendations for follow up therapy are one component of a multi-disciplinary discharge planning process, led by the attending physician.  Recommendations may be updated based on patient status, additional functional criteria and insurance authorization.  Follow up Recommendations No SLP follow up      Assistance Recommended at Discharge  24 hour supervision  Functional Status Assessment Patient has not had a recent decline in their functional status - baseline dementia  Frequency and Duration min 1 x/week  1 week;2 weeks       Prognosis  Prognosis for improved oropharyngeal function: Fair Barriers to Reach Goals: Cognitive  deficits;Severity of deficits      Swallow Study   General Date of Onset: 11/21/23 HPI: 89yo female admitted from [facility] 11/21/23 with weakness/COVID. PMH: CKD4, colon cancer, HTN, dementia, anxiety, depression. CXR = LLL opacity Type of Study: Bedside Swallow Evaluation Previous Swallow Assessment: none Diet Prior to this Study: NPO Temperature Spikes Noted: No Respiratory Status: Room air History of Recent Intubation: No Behavior/Cognition: Alert;Confused;Requires cueing;Doesn't follow directions;Distractible Oral Cavity Assessment: Dry Oral Care Completed by SLP: Yes Oral Cavity - Dentition: Missing dentition Vision: Functional for self-feeding Self-Feeding Abilities: Able to feed self;Needs assist Patient Positioning: Upright in bed Baseline Vocal Quality: Normal Volitional Cough: Cognitively unable to elicit Volitional Swallow: Unable to elicit    Oral/Motor/Sensory Function Overall Oral Motor/Sensory Function: Generalized oral weakness      Thin Liquid Thin Liquid: Within functional limits Presentation: Cup;Self Fed;Straw    Puree Puree: Within functional limits Presentation: Spoon    Solid     Solid: Impaired Oral Phase Impairments: Poor awareness of bolus Other Comments: Pt appeared to chew softened graham cracker, however, she tends to talk incessantly with food in her mouth, increasing risk of aspiration of poorly chewed solid textures.     Tonantzin Mimnaugh B. Murvin Natal, MSP, CCC-SLP Speech Language Pathologist  [name] 11/26/2023,11:30 AM

## 2023-11-26 NOTE — NC FL2 (Signed)
[address] MEDICAID FL2 LEVEL OF CARE FORM     IDENTIFICATION  Patient Name: Autumn Johnston Birthdate: [birth year] Sex: female Admission Date (Current Location): 11/21/2023  [facility] and [place] Number:  Chiropodist and Address:  [facility], [address], [place], [place] [address]      Provider Number: [id number]  Attending Physician Name and Address:  Loyce Dys, MD  Relative Name and Phone Number:  Riley Kill (Daughter)  [phone] [facility] Phone)    Current Level of Care: Hospital Recommended Level of Care: Skilled Nursing Facility Prior Approval Number:    Date Approved/Denied:   PASRR Number: [id number]  Discharge Plan:      Current Diagnoses: Patient Active Problem List   Diagnosis Date Noted   Acute respiratory disease due to COVID-19 virus 11/21/2023   Chronic diastolic CHF (congestive heart failure) (HCC) 11/21/2023   Depression with anxiety 11/21/2023   Acute respiratory failure with hypoxia (HCC) 11/21/2023   Ambulatory dysfunction 10/01/2023   Sacral insufficiency fracture, subacute 10/01/2023   Caregiver unable to cope 10/01/2023   AKI (acute kidney injury) (HCC) 10/01/2023   Dementia (HCC)    Parastomal hernia    Frailty 08/06/2023   Closed compression fracture of L4 vertebra (HCC) 08/06/2023   Hydronephrosis of right kidney 08/06/2023   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) 08/06/2023   CAP (community acquired pneumonia) 03/31/2022   Pressure injury of skin 09/12/2021   Acute blood loss anemia    COVID-19 virus infection    Left lower quadrant abdominal mass    Hypotension    Pre-op evaluation    Stage 3b chronic kidney disease (HCC)    Closed hip fracture requiring operative repair, left, sequela 09/07/2021   Fall at home, initial encounter 09/07/2021   Protein-calorie malnutrition, severe 07/31/2021   Nonsustained ventricular tachycardia (HCC) 07/30/2021   Hyponatremia 07/29/2021    Demand ischemia (HCC)    Hypertensive urgency 07/28/2021   Protein calorie malnutrition, unspecified (HCC) 07/28/2021   Essential hypertension 07/27/2021   Hyperlipidemia 07/27/2021   Pernicious anemia 07/27/2021   Renal failure (ARF), acute on chronic (HCC) 07/27/2021   Acute hyponatremia 07/27/2021   Elevated troponin 07/27/2021   Acute metabolic encephalopathy 07/27/2021   Chronic insomnia 04/05/2017   Chronic kidney disease (CKD), stage IV (severe) (HCC) 12/25/2015   Anemia of chronic disease 07/20/2014    Orientation RESPIRATION BLADDER Height & Weight        Normal Incontinent, External catheter Weight: 38.6 kg Height:  5\' 2"  (157.5 cm)  BEHAVIORAL SYMPTOMS/MOOD NEUROLOGICAL BOWEL NUTRITION STATUS      Colostomy  (NPO)  AMBULATORY STATUS COMMUNICATION OF NEEDS Skin   Limited Assist Verbally PU Stage and Appropriate Care   PU Stage 2 Dressing:  (q3days)                   Personal Care Assistance Level of Assistance  Bathing, Feeding, Dressing Bathing Assistance: Limited assistance Feeding assistance: Limited assistance Dressing Assistance: Limited assistance     Functional Limitations Info  Speech     Speech Info: Impaired (Slurred/ Dysarthria)    SPECIAL CARE FACTORS FREQUENCY  PT (By licensed PT), OT (By licensed OT)       OT Frequency: Min 2x weekly            Contractures      Additional Factors Info  Code Status, Allergies Code Status Info: DNR Allergies Info: No Known Allergies  Current Medications (11/26/2023):  This is the current hospital active medication list Current Facility-Administered Medications  Medication Dose Route Frequency Provider Last Rate Last Admin   acetaminophen (TYLENOL) tablet 650 mg  650 mg Oral Q6H PRN Lorretta Harp, MD   650 mg at 11/24/23 2133   albuterol (PROVENTIL) (2.5 MG/3ML) 0.083% nebulizer solution 2.5 mg  2.5 mg Nebulization Q4H PRN Lorretta Harp, MD       ALPRAZolam Prudy Feeler) tablet 0.25 mg  0.25 mg  Oral TID PRN Lorretta Harp, MD   0.25 mg at 11/24/23 2132   ascorbic acid (VITAMIN C) tablet 500 mg  500 mg Oral BID Marcelino Duster, MD   500 mg at 11/26/23 0850   Chlorhexidine Gluconate Cloth 2 % PADS 6 each  6 each Topical Q0600 Lorretta Harp, MD   6 each at 11/26/23 0851   dextromethorphan-guaiFENesin (MUCINEX DM) 30-600 MG per 12 hr tablet 1 tablet  1 tablet Oral BID PRN Lorretta Harp, MD       diphenhydrAMINE (BENADRYL) injection 12.5 mg  12.5 mg Intravenous Q8H PRN Lorretta Harp, MD   12.5 mg at 11/21/23 2155   feeding supplement (ENSURE ENLIVE / ENSURE PLUS) liquid 237 mL  237 mL Oral BID BM Lorretta Harp, MD   237 mL at 11/25/23 1018   fesoterodine (TOVIAZ) tablet 4 mg  4 mg Oral Daily Lorretta Harp, MD   4 mg at 11/26/23 9563   Gerhardt's butt cream   Topical BID Loyce Dys, MD   Given at 11/26/23 0855   heparin injection 5,000 Units  5,000 Units Subcutaneous Alean Rinne, MD   5,000 Units at 11/26/23 0540   hydrALAZINE (APRESOLINE) injection 5 mg  5 mg Intravenous Q2H PRN Lorretta Harp, MD       HYDROcodone-acetaminophen (NORCO/VICODIN) 5-325 MG per tablet 1-2 tablet  1-2 tablet Oral Q4H PRN Lorretta Harp, MD       Ipratropium-Albuterol (COMBIVENT) respimat 1 puff  1 puff Inhalation Q4H Paulita Fujita H, RPH   1 puff at 11/26/23 0856   mirtazapine (REMERON) tablet 7.5 mg  7.5 mg Oral QHS Lorretta Harp, MD   7.5 mg at 11/24/23 2132   multivitamin with minerals tablet 1 tablet  1 tablet Oral Daily Marcelino Duster, MD   1 tablet at 11/26/23 0850   Oral care mouth rinse  15 mL Mouth Rinse PRN Lorretta Harp, MD       polyethylene glycol (MIRALAX / GLYCOLAX) packet 17 g  17 g Oral Daily PRN Lorretta Harp, MD       Melene Muller ON 11/30/2023] predniSONE (DELTASONE) tablet 10 mg  10 mg Oral Q breakfast Loyce Dys, MD       [START ON 11/28/2023] predniSONE (DELTASONE) tablet 20 mg  20 mg Oral Q breakfast Djan, Criss Alvine T, MD       predniSONE (DELTASONE) tablet 40 mg  40 mg Oral Q breakfast Rosezetta Schlatter T, MD        zinc sulfate (50mg  elemental zinc) capsule 220 mg  220 mg Oral Daily Marcelino Duster, MD   220 mg at 11/26/23 8756     Discharge Medications: Please see discharge summary for a list of discharge medications.  Relevant Imaging Results:  Relevant Lab Results:   Additional Information [id number]  Truddie Hidden, RN

## 2023-11-26 NOTE — Progress Notes (Signed)
Progress Note   Patient: Autumn Johnston VWU:[id number] DOB: Dec 18, 1922 DOA: 11/21/2023     5 DOS: the patient was seen and examined on 11/26/2023    Brief hospital course: Autumn Johnston is a 89 y.o. female with medical history significant of dCHF, HTN, dementia per her daughter, depression with anxiety, CKD-4, colon cancer (s/p colostomy and s/p of colectomy with a chronic large para colostomy hernia), who presents with SOB and cough. Tested positive for COVID. Cxray showed left basilar opacity admitted for COVID pneumonia.   Assessment and Plan: Pneumonia due to COVID-19 virus  Acute respiratory failure with hypoxia (HCC):  O2 weaned to 4 L supplemental O2. Got a dose of rocephin, azithro in ED. Continue bronchodilators Continue as needed Mucinex. Continue isolation precautions. Solu-Medrol has been switched to prednisone taper Continue supplemental oxygen to maintain oxygen saturation above 92%.   Possible aspiration event Diet has been advanced after speech therapist eval   Chronic diastolic CHF (congestive heart failure) (HCC):  No CHF exacerbation. Encourage oral diet, fluids. Hold diuretics for now worsening renal function   Essential hypertension: Blood pressure 94/49 IV hydralazine as needed Continue to hold amlodipine and Imdur due to softer blood pressure   Acute on Chronic kidney disease (CKD), stage IV (severe) [facility]):  Patient has worsening renal function.  Recent baseline creatinine 1.13 on 10/04/2023. Today cr 1.60. Avoid nephrotoxic drugs.   Dementia (HCC) Confusion improved Delirium precautions. Continue fall and aspiration precautions. Supportive care.   Depression with anxiety as needed Xanax.   Large para colostomy hernia - s/p colostomy and s/p of colectomy with a chronic large para colostomy hernia. Had CT abdomen 9/27, stable hernia, no evidence of obstruction. Size seems to be bigger than usual per RN. KUB ordered. I discussed with her  daughter, wishes only conservative management.   Protein-calorie malnutrition, severe: Body weight 43.7 kg, BMI 17.4 Continue supplements, Ensure   Nutrition Documentation     Flowsheet Row ED to Hosp-Admission (Current) from 11/21/2023 in [facility]  Nutrition Problem Severe Malnutrition  Etiology chronic illness  [dementia]  Nutrition Goal Patient will meet greater than or equal to 90% of their needs  Interventions Ensure Enlive (each supplement provides 350kcal and 20 grams of protein), Magic cup, MVI, Liberalize Diet       ,  Active Pressure Injury/Wound(s)       Pressure Ulcer  Duration             Pressure Injury 08/09/23 Sacrum Stage 2 -  Partial thickness loss of dermis presenting as a shallow open injury with a red, pink wound bed without slough. skin tear to gluteal cleft 105 days                Subjective:  Denies nausea vomiting abdominal pain She has mittens on and would like this to be taking off Have been assessed by speech therapist today   Physical Exam:   General - Elderly ill Caucasian thin built female, mild respiratory distress HEENT - PERRLA, EOMI, atraumatic head, non tender sinuses. Lung - Clear, basal rales, diffuse rhonchi, wheezes. Heart - S1, S2 heard, no murmurs, rubs, no pedal edema. Abdomen - Soft, non tender, chronic large para colostomy hernia noted Neuro - sleepy, lethargic, unable to do full neuro exam Skin - Warm and dry.     Family Communication: Discussed with daughter, she understand and agree. All questions answereed.     Disposition: Status is: Inpatient Remains inpatient appropriate because: COVID  pneumonia  Planned Discharge Destination: Skilled nursing facility     Time spent: 40 minutes   Data Reviewed:     Latest Ref Rng & Units 11/26/2023    6:50 AM 11/25/2023    5:00 AM 11/24/2023    6:17 AM  CBC  WBC 4.0 - 10.5 K/uL 9.3  7.6  10.6   Hemoglobin 12.0 - 15.0 g/dL 16.1  09.6  04.5    Hematocrit 36.0 - 46.0 % 35.3  31.8  32.1   Platelets 150 - 400 K/uL 458  377  430        Latest Ref Rng & Units 11/26/2023    6:50 AM 11/25/2023    5:00 AM 11/24/2023    6:17 AM  BMP  Glucose 70 - 99 mg/dL 94  409  811   BUN 8 - 23 mg/dL 55  51  48   Creatinine 0.44 - 1.00 mg/dL 9.14  7.82  9.56   Sodium 135 - 145 mmol/L 144  138  138   Potassium 3.5 - 5.1 mmol/L 3.8  4.2  3.9   Chloride 98 - 111 mmol/L 110  109  109   CO2 22 - 32 mmol/L 22  21  20    Calcium 8.9 - 10.3 mg/dL 8.5  8.3  8.4      Vitals:   11/25/23 1943 11/25/23 2312 11/26/23 0305 11/26/23 0443  BP: 125/86 139/77 (!) 121/90   Pulse: 83 89 79   Resp: 19 20 17    Temp: 97.8 F (36.6 C) 97.9 F (36.6 C) 98.2 F (36.8 C)   TempSrc: Axillary Oral    SpO2: 95% 94% 100%   Weight:    38.6 kg  Height:   5\' 2"  (1.575 m)      Author: Loyce Dys, MD 11/26/2023 4:38 PM  For on call review [email or web address].

## 2023-11-26 NOTE — Plan of Care (Signed)

## 2023-11-26 NOTE — TOC Progression Note (Addendum)
Transition of Care [facility]) - Progression Note    Patient Details  Name: Autumn Johnston MRN: [id number] Date of Birth: 10-23-1922  Transition of Care [facility]) CM/SW Contact  Truddie Hidden, RN Phone Number: 11/26/2023, 10:06 AM  Clinical Narrative:    Attempt to reach patient's daughter discharge plan. No answer. Left a message. FL2 completed.   Retrieved call from patient's daughter. Patient is from [facility] and will return at discharge.        Expected Discharge Plan and Services                                               Social Determinants of Health (SDOH) Interventions SDOH Screenings   Food Insecurity: Patient Unable To Answer (10/01/2023)  Housing: Patient Unable To Answer (10/01/2023)  Transportation Needs: Patient Unable To Answer (10/01/2023)  Utilities: Patient Unable To Answer (10/01/2023)  Tobacco Use: Low Risk  (11/21/2023)    Readmission Risk Interventions    10/04/2023    2:38 PM 03/31/2022   11:40 AM 09/17/2021    9:37 AM  Readmission Risk Prevention Plan  Transportation Screening Complete Complete Complete  PCP or Specialist Appt within 3-5 Days Complete Complete   HRI or Home Care Consult Complete Complete   Social Work Consult for Recovery Care Planning/Counseling Complete Complete   Palliative Care Screening Complete Not Applicable   Medication Review [employer]) Complete Complete Complete  PCP or Specialist appointment within 3-5 days of discharge   Complete  HRI or Home Care Consult   Complete  SW Recovery Care/Counseling Consult   Not Complete  SW Consult Not Complete Comments   RNCM assigned to case  Palliative Care Screening   Not Applicable  Comments   na  Skilled Nursing Facility   Complete

## 2023-11-27 DIAGNOSIS — J069 Acute upper respiratory infection, unspecified: Secondary | ICD-10-CM | POA: Diagnosis not present

## 2023-11-27 DIAGNOSIS — U071 COVID-19: Secondary | ICD-10-CM | POA: Diagnosis not present

## 2023-11-27 LAB — BASIC METABOLIC PANEL
Anion gap: 11 (ref 5–15)
BUN: 47 mg/dL — ABNORMAL HIGH (ref 8–23)
CO2: 23 mmol/L (ref 22–32)
Calcium: 8.7 mg/dL — ABNORMAL LOW (ref 8.9–10.3)
Chloride: 108 mmol/L (ref 98–111)
Creatinine, Ser: 1.39 mg/dL — ABNORMAL HIGH (ref 0.44–1.00)
GFR, Estimated: 34 mL/min — ABNORMAL LOW (ref >60)
Glucose, Bld: 85 mg/dL (ref 70–99)
Potassium: 4 mmol/L (ref 3.5–5.1)
Sodium: 142 mmol/L (ref 135–145)

## 2023-11-27 LAB — CBC WITH DIFFERENTIAL/PLATELET
Abs Immature Granulocytes: 0.13 10*3/uL — ABNORMAL HIGH (ref 0.00–0.07)
Basophils Absolute: 0 10*3/uL (ref 0.0–0.1)
Basophils Relative: 0 %
Eosinophils Absolute: 0 10*3/uL (ref 0.0–0.5)
Eosinophils Relative: 0 %
HCT: 39.5 % (ref 36.0–46.0)
Hemoglobin: 12.8 g/dL (ref 12.0–15.0)
Immature Granulocytes: 1 %
Lymphocytes Relative: 8 %
Lymphs Abs: 0.8 10*3/uL (ref 0.7–4.0)
MCH: 27.8 pg (ref 26.0–34.0)
MCHC: 32.4 g/dL (ref 30.0–36.0)
MCV: 85.9 fL (ref 80.0–100.0)
Monocytes Absolute: 0.4 10*3/uL (ref 0.1–1.0)
Monocytes Relative: 4 %
Neutro Abs: 8.4 10*3/uL — ABNORMAL HIGH (ref 1.7–7.7)
Neutrophils Relative %: 87 %
Platelets: 417 10*3/uL — ABNORMAL HIGH (ref 150–400)
RBC: 4.6 MIL/uL (ref 3.87–5.11)
RDW: 14.9 % (ref 11.5–15.5)
WBC: 9.8 10*3/uL (ref 4.0–10.5)
nRBC: 0 % (ref 0.0–0.2)

## 2023-11-27 NOTE — Plan of Care (Signed)

## 2023-11-27 NOTE — Progress Notes (Signed)
Progress Note   Patient: Autumn Johnston ZOX:[id number] DOB: 1922-07-27 DOA: 11/21/2023     6 DOS: the patient was seen and examined on 11/27/2023   Brief hospital course:   Brief hospital course: Mykale Levatino is a 89 y.o. female with medical history significant of dCHF, HTN, dementia per her daughter, depression with anxiety, CKD-4, colon cancer (s/p colostomy and s/p of colectomy with a chronic large para colostomy hernia), who presents with SOB and cough. Tested positive for COVID. Cxray showed left basilar opacity admitted for COVID pneumonia.   Assessment and Plan: Pneumonia due to COVID-19 virus  Acute respiratory failure with hypoxia (HCC):  O2 weaned to 4 L supplemental O2. Got a dose of rocephin, azithro in ED. Continue bronchodilators Continue as needed Mucinex. Continue isolation precautions. Solu-Medrol has been switched to prednisone taper Continue supplemental oxygen to maintain oxygen saturation above 92%.   Possible aspiration event Diet has been advanced after speech therapist eval   Chronic diastolic CHF (congestive heart failure) (HCC):  No CHF exacerbation. Encourage oral diet, fluids. Hold diuretics for now worsening renal function   Essential hypertension: Blood pressure 94/49 IV hydralazine as needed Continue to hold amlodipine and Imdur due to softer blood pressure   Acute on Chronic kidney disease (CKD), stage IV (severe) [facility]):  Patient has worsening renal function.  Recent baseline creatinine 1.13 on 10/04/2023. Today cr 1.60. Avoid nephrotoxic drugs.   Dementia (HCC) Confusion improved Delirium precautions. Continue fall and aspiration precautions. Supportive care.   Depression with anxiety as needed Xanax.   Large para colostomy hernia - s/p colostomy and s/p of colectomy with a chronic large para colostomy hernia. Had CT abdomen 9/27, stable hernia, no evidence of obstruction. Size seems to be bigger than usual per RN. KUB  ordered. I discussed with her daughter, wishes only conservative management.   Protein-calorie malnutrition, severe: Body weight 43.7 kg, BMI 17.4 Continue supplements, Ensure   Nutrition Documentation     Flowsheet Row ED to Hosp-Admission (Current) from 11/21/2023 in [facility]  Nutrition Problem Severe Malnutrition  Etiology chronic illness  [dementia]  Nutrition Goal Patient will meet greater than or equal to 90% of their needs  Interventions Ensure Enlive (each supplement provides 350kcal and 20 grams of protein), Magic cup, MVI, Liberalize Diet       ,  Active Pressure Injury/Wound(s)       Pressure Ulcer  Duration             Pressure Injury 08/09/23 Sacrum Stage 2 -  Partial thickness loss of dermis presenting as a shallow open injury with a red, pink wound bed without slough. skin tear to gluteal cleft 105 days                Subjective:  Patient seen and examined at bedside this morning Admits to significant thickening of Denies nausea vomiting or chest pain  Physical Exam:   General - Elderly ill Caucasian thin built female, mild respiratory distress HEENT - PERRLA, EOMI, atraumatic head, non tender sinuses. Lung - Clear, basal rales, diffuse rhonchi, wheezes. Heart - S1, S2 heard, no murmurs, rubs, no pedal edema. Abdomen - Soft, non tender, chronic large para colostomy hernia noted Neuro - sleepy, lethargic, unable to do full neuro exam Skin - Warm and dry.     Family Communication: Discussed with daughter, she understand and agree. All questions answereed.     Disposition: Status is: Inpatient Remains inpatient appropriate because: COVID pneumonia  Planned Discharge Destination: Skilled nursing facility     Time spent: 35 minutes   Data Reviewed:     Latest Ref Rng & Units 11/27/2023    7:02 AM 11/26/2023    6:50 AM 11/25/2023    5:00 AM  CBC  WBC 4.0 - 10.5 K/uL 9.8  9.3  7.6   Hemoglobin 12.0 - 15.0 g/dL 45.4  09.8   11.9   Hematocrit 36.0 - 46.0 % 39.5  35.3  31.8   Platelets 150 - 400 K/uL 417  458  377        Latest Ref Rng & Units 11/27/2023    7:02 AM 11/26/2023    6:50 AM 11/25/2023    5:00 AM  BMP  Glucose 70 - 99 mg/dL 85  94  147   BUN 8 - 23 mg/dL 47  55  51   Creatinine 0.44 - 1.00 mg/dL 8.29  5.62  1.30   Sodium 135 - 145 mmol/L 142  144  138   Potassium 3.5 - 5.1 mmol/L 4.0  3.8  4.2   Chloride 98 - 111 mmol/L 108  110  109   CO2 22 - 32 mmol/L 23  22  21    Calcium 8.9 - 10.3 mg/dL 8.7  8.5  8.3     Vitals:   11/27/23 0405 11/27/23 0500 11/27/23 0852 11/27/23 1229  BP: 136/80  133/64 133/72  Pulse: 71  82 75  Resp: 16     Temp: 98 F (36.7 C)  97.8 F (36.6 C) (!) 96.8 F (36 C)  TempSrc:      SpO2: 95%  93% 96%  Weight:  37.9 kg    Height:        Author: Loyce Dys, MD 11/27/2023 4:42 PM  For on call review [email or web address].

## 2023-11-27 NOTE — Plan of Care (Signed)
  Problem: Respiratory: Goal: Will maintain a patent airway 11/27/2023 2258 by Elita Quick, RN Outcome: Progressing 11/27/2023 2258 by Elita Quick, RN Outcome: Progressing   Problem: Clinical Measurements: Goal: Respiratory complications will improve Outcome: Progressing Goal: Cardiovascular complication will be avoided Outcome: Progressing   Problem: Elimination: Goal: Will not experience complications related to bowel motility Outcome: Progressing Goal: Will not experience complications related to urinary retention Outcome: Progressing   Problem: Pain Management: Goal: General experience of comfort will improve Outcome: Progressing   Problem: Safety: Goal: Ability to remain free from injury will improve Outcome: Progressing   Problem: Skin Integrity: Goal: Risk for impaired skin integrity will decrease Outcome: Progressing

## 2023-11-28 DIAGNOSIS — U071 COVID-19: Secondary | ICD-10-CM | POA: Diagnosis not present

## 2023-11-28 DIAGNOSIS — J069 Acute upper respiratory infection, unspecified: Secondary | ICD-10-CM | POA: Diagnosis not present

## 2023-11-28 NOTE — Plan of Care (Signed)
  Problem: Respiratory: Goal: Will maintain a patent airway Outcome: Progressing   Problem: Education: Goal: Knowledge of General Education information will improve Description: Including pain rating scale, medication(s)/side effects and non-pharmacologic comfort measures Outcome: Progressing   Problem: Health Behavior/Discharge Planning: Goal: Ability to manage health-related needs will improve Outcome: Progressing   Problem: Clinical Measurements: Goal: Ability to maintain clinical measurements within normal limits will improve Outcome: Progressing Goal: Will remain free from infection Outcome: Progressing Goal: Diagnostic test results will improve Outcome: Progressing Goal: Respiratory complications will improve Outcome: Progressing Goal: Cardiovascular complication will be avoided Outcome: Progressing   Problem: Activity: Goal: Risk for activity intolerance will decrease Outcome: Progressing   Problem: Nutrition: Goal: Adequate nutrition will be maintained Outcome: Progressing   Problem: Coping: Goal: Level of anxiety will decrease Outcome: Progressing   Problem: Elimination: Goal: Will not experience complications related to bowel motility Outcome: Progressing Goal: Will not experience complications related to urinary retention Outcome: Progressing   Problem: Pain Management: Goal: General experience of comfort will improve Outcome: Progressing   Problem: Safety: Goal: Ability to remain free from injury will improve Outcome: Progressing   Problem: Skin Integrity: Goal: Risk for impaired skin integrity will decrease Outcome: Progressing

## 2023-11-28 NOTE — Progress Notes (Signed)
Progress Note   Patient: Autumn Johnston ZOX:[id number] DOB: [birth year] DOA: 11/21/2023     7 DOS: the patient was seen and examined on 11/28/2023  Brief hospital course: Aleema Aumiller is a 89 y.o. female with medical history significant of dCHF, HTN, dementia per her daughter, depression with anxiety, CKD-4, colon cancer (s/p colostomy and s/p of colectomy with a chronic large para colostomy hernia), who presents with SOB and cough. Tested positive for COVID. Cxray showed left basilar opacity admitted for COVID pneumonia.   Assessment and Plan: Pneumonia due to COVID-19 virus  Acute respiratory failure with hypoxia (HCC):  O2 weaned to 4 L supplemental O2. Got a dose of rocephin, azithro in ED. Continue bronchodilators Continue as needed Mucinex. Continue isolation precautions. Solu-Medrol has been switched to prednisone taper Continue supplemental oxygen to maintain oxygen saturation above 92%.   Possible aspiration event Diet has been advanced after speech therapist eval   Chronic diastolic CHF (congestive heart failure) (HCC):  No CHF exacerbation. Encourage oral diet, fluids. Hold diuretics for now worsening renal function   Essential hypertension: Blood pressure 94/49 IV hydralazine as needed Continue to hold amlodipine and Imdur due to softer blood pressure   Acute on Chronic kidney disease (CKD), stage IV (severe) [facility]):  Patient has worsening renal function.  Recent baseline creatinine 1.13 on 10/04/2023. Today cr 1.60. Avoid nephrotoxic drugs.   Dementia (HCC) Confusion improved Continue delirium precautions. Continue fall and aspiration precautions. Supportive care.   Depression with anxiety Continue as needed Xanax.   Large para colostomy hernia - s/p colostomy and s/p of colectomy with a chronic large para colostomy hernia. Had CT abdomen 9/27, stable hernia, no evidence of obstruction. Size seems to be bigger than usual per RN. KUB ordered. I  discussed with her daughter, wishes only conservative management.   Protein-calorie malnutrition, severe: Body weight 43.7 kg, BMI 17.4 Continue supplements, Ensure   Nutrition Documentation     Flowsheet Row ED to Hosp-Admission (Current) from 11/21/2023 in [facility]  Nutrition Problem Severe Malnutrition  Etiology chronic illness  [dementia]  Nutrition Goal Patient will meet greater than or equal to 90% of their needs  Interventions Ensure Enlive (each supplement provides 350kcal and 20 grams of protein), Magic cup, MVI, Liberalize Diet       ,  Active Pressure Injury/Wound(s)       Pressure Ulcer  Duration             Pressure Injury 08/09/23 Sacrum Stage 2 -  Partial thickness loss of dermis presenting as a shallow open injury with a red, pink wound bed without slough. skin tear to gluteal cleft 105 days                Subjective:  Patient seen and examined at bedside this morning Denies nausea vomiting abdominal pain   Physical Exam:   General - Elderly ill Caucasian thin built female, mild respiratory distress HEENT - PERRLA, EOMI, atraumatic head, non tender sinuses. Lung - Clear, basal rales, diffuse rhonchi, wheezes. Heart - S1, S2 heard, no murmurs, rubs, no pedal edema. Abdomen - Soft, non tender, chronic large para colostomy hernia noted Neuro - sleepy, lethargic, unable to do full neuro exam Skin - Warm and dry.     Family Communication: Discussed with daughter, she understand and agree. All questions answereed.     Disposition: Status is: Inpatient Remains inpatient appropriate because: COVID pneumonia  Planned Discharge Destination: Skilled nursing facility  Time spent: 36 minutes   Data Reviewed:   Vitals:   11/28/23 0500 11/28/23 0808 11/28/23 1139 11/28/23 1702  BP:  135/86 (!) 123/54 (!) 154/91  Pulse:  86 72 95  Resp:  20 17 20   Temp:  97.8 F (36.6 C) (!) 97.5 F (36.4 C) 97.8 F (36.6 C)  TempSrc:   Axillary Oral Oral  SpO2:  94% 91% 90%  Weight: 36.6 kg     Height:          Latest Ref Rng & Units 11/27/2023    7:02 AM 11/26/2023    6:50 AM 11/25/2023    5:00 AM  CBC  WBC 4.0 - 10.5 K/uL 9.8  9.3  7.6   Hemoglobin 12.0 - 15.0 g/dL 57.8  46.9  62.9   Hematocrit 36.0 - 46.0 % 39.5  35.3  31.8   Platelets 150 - 400 K/uL 417  458  377        Latest Ref Rng & Units 11/27/2023    7:02 AM 11/26/2023    6:50 AM 11/25/2023    5:00 AM  BMP  Glucose 70 - 99 mg/dL 85  94  528   BUN 8 - 23 mg/dL 47  55  51   Creatinine 0.44 - 1.00 mg/dL 4.13  2.44  0.10   Sodium 135 - 145 mmol/L 142  144  138   Potassium 3.5 - 5.1 mmol/L 4.0  3.8  4.2   Chloride 98 - 111 mmol/L 108  110  109   CO2 22 - 32 mmol/L 23  22  21    Calcium 8.9 - 10.3 mg/dL 8.7  8.5  8.3      Author: Loyce Dys, MD 11/28/2023 5:51 PM  For on call review [email or web address].

## 2023-11-29 DIAGNOSIS — J069 Acute upper respiratory infection, unspecified: Secondary | ICD-10-CM | POA: Diagnosis not present

## 2023-11-29 DIAGNOSIS — U071 COVID-19: Secondary | ICD-10-CM | POA: Diagnosis not present

## 2023-11-29 MED ORDER — ZIPRASIDONE MESYLATE 20 MG IM SOLR
10.0000 mg | Freq: Four times a day (QID) | INTRAMUSCULAR | Status: DC | PRN
  Filled 2023-11-29: qty 20

## 2023-11-29 NOTE — Progress Notes (Signed)
SLP Cancellation Note  Patient Details Name: Autumn Johnston MRN: [id number] DOB: [birth year]   Cancelled treatment:       Reason Eval/Treat Not Completed: Patient at procedure or test/unavailable  Will continue efforts to assess diet tolerance and continue education.  Mitzi Lilja B. Murvin Natal, [facility], CCC-SLP Speech Language Pathologist  [name] 11/29/2023, 2:52 PM

## 2023-11-29 NOTE — Progress Notes (Signed)
Progress Note   Patient: Autumn Johnston ZOX:[id number] DOB: 07-14-1922 DOA: 11/21/2023     8 DOS: the patient was seen and examined on 11/29/2023     Brief hospital course: Autumn Johnston is a 89 y.o. female with medical history significant of dCHF, HTN, dementia per her daughter, depression with anxiety, CKD-4, colon cancer (s/p colostomy and s/p of colectomy with a chronic large para colostomy hernia), who presents with SOB and cough. Tested positive for COVID. Cxray showed left basilar opacity admitted for COVID pneumonia.   Assessment and Plan: Pneumonia due to COVID-19 virus  Acute respiratory failure with hypoxia (HCC):  O2 weaned to 4 L supplemental O2. Got a dose of rocephin, azithro in ED. Continue bronchodilators Continue as needed Mucinex. Continue isolation precautions. Continue prednisone taper Continue supplemental oxygen to maintain oxygen saturation above 92%.   Possible aspiration event Diet has been advanced after speech therapist eval   Chronic diastolic CHF (congestive heart failure) (HCC):  No CHF exacerbation. Encourage oral diet, fluids. Hold diuretics for now worsening renal function   Essential hypertension: Blood pressure 94/49 IV hydralazine as needed Continue to hold amlodipine and Imdur due to softer blood pressure   Acute on Chronic kidney disease (CKD), stage IV (severe) [facility]):  Patient has worsening renal function.  Recent baseline creatinine 1.13 on 10/04/2023.  Avoid nephrotoxic drugs.   Dementia with behavioral disturbances (HCC) Continue delirium precautions. Continue fall and aspiration precautions. Supportive care.   Depression with anxiety Continue as needed Xanax.   Large para colostomy hernia - s/p colostomy and s/p of colectomy with a chronic large para colostomy hernia. Patient's daughter wishes only conservative management.   Protein-calorie malnutrition, severe: Body weight 43.7 kg, BMI 17.4 Continue supplements,  Ensure   Nutrition Documentation     Flowsheet Row ED to Hosp-Admission (Current) from 11/21/2023 in [facility]  Nutrition Problem Severe Malnutrition  Etiology chronic illness  [dementia]  Nutrition Goal Patient will meet greater than or equal to 90% of their needs  Interventions Ensure Enlive (each supplement provides 350kcal and 20 grams of protein), Magic cup, MVI, Liberalize Diet       ,  Active Pressure Injury/Wound(s)       Pressure Ulcer  Duration             Pressure Injury 08/09/23 Sacrum Stage 2 -  Partial thickness loss of dermis presenting as a shallow open injury with a red, pink wound bed without slough. skin tear to gluteal cleft 105 days                Subjective:  Patient seen and examined at bedside this morning Waiting today she became a little bit agitated shouting and nurses Geodon was 100   Physical Exam:   General - Elderly ill Caucasian thin built female, mild respiratory distress HEENT - PERRLA, EOMI, atraumatic head, non tender sinuses. Lung - Clear, basal rales, diffuse rhonchi, wheezes. Heart - S1, S2 heard, no murmurs, rubs, no pedal edema. Abdomen - Soft, non tender, chronic large para colostomy hernia noted Neuro - sleepy, lethargic, unable to do full neuro exam Skin - Warm and dry.     Family Communication: Discussed with daughter, she understand and agree. All questions answereed.     Disposition: Status is: Inpatient Remains inpatient appropriate because: COVID pneumonia  Planned Discharge Destination: Skilled nursing facility     Time spent: 37 minutes   Data Reviewed:      Latest Ref Rng &  Units 11/27/2023    7:02 AM 11/26/2023    6:50 AM 11/25/2023    5:00 AM  BMP  Glucose 70 - 99 mg/dL 85  94  161   BUN 8 - 23 mg/dL 47  55  51   Creatinine 0.44 - 1.00 mg/dL 0.96  0.45  4.09   Sodium 135 - 145 mmol/L 142  144  138   Potassium 3.5 - 5.1 mmol/L 4.0  3.8  4.2   Chloride 98 - 111 mmol/L 108  110  109    CO2 22 - 32 mmol/L 23  22  21    Calcium 8.9 - 10.3 mg/dL 8.7  8.5  8.3        Latest Ref Rng & Units 11/27/2023    7:02 AM 11/26/2023    6:50 AM 11/25/2023    5:00 AM  CBC  WBC 4.0 - 10.5 K/uL 9.8  9.3  7.6   Hemoglobin 12.0 - 15.0 g/dL 81.1  91.4  78.2   Hematocrit 36.0 - 46.0 % 39.5  35.3  31.8   Platelets 150 - 400 K/uL 417  458  377     Vitals:   11/29/23 0905 11/29/23 1143 11/29/23 1202 11/29/23 1430  BP: (!) 144/61 (!) 107/48 (!) 115/49 115/71  Pulse: 85 63 64 68  Resp: (!) 24 20 17 20   Temp: 99.1 F (37.3 C) 98 F (36.7 C) (!) 97.2 F (36.2 C) 97.8 F (36.6 C)  TempSrc:  Oral Axillary Oral  SpO2: (!) 79% 92% 91% 90%  Weight:      Height:         Author: Loyce Dys, MD 11/29/2023 4:46 PM  For on call review [email or web address].

## 2023-11-29 NOTE — TOC Progression Note (Signed)
Transition of Care [facility]) - Progression Note    Patient Details  Name: Autumn Johnston MRN: [id number] Date of Birth: 07/02/1922  Transition of Care [facility]) CM/SW Contact  Truddie Hidden, RN Phone Number: 11/29/2023, 10:03 AM  Clinical Narrative:    TOC continuing to follow patient's progress throughout discharge planning.        Expected Discharge Plan and Services                                               Social Determinants of Health (SDOH) Interventions SDOH Screenings   Food Insecurity: Patient Unable To Answer (10/01/2023)  Housing: Patient Unable To Answer (10/01/2023)  Transportation Needs: Patient Unable To Answer (10/01/2023)  Utilities: Patient Unable To Answer (10/01/2023)  Tobacco Use: Low Risk  (11/21/2023)    Readmission Risk Interventions    10/04/2023    2:38 PM 03/31/2022   11:40 AM 09/17/2021    9:37 AM  Readmission Risk Prevention Plan  Transportation Screening Complete Complete Complete  PCP or Specialist Appt within 3-5 Days Complete Complete   HRI or Home Care Consult Complete Complete   Social Work Consult for Recovery Care Planning/Counseling Complete Complete   Palliative Care Screening Complete Not Applicable   Medication Review Oceanographer) Complete Complete Complete  PCP or Specialist appointment within 3-5 days of discharge   Complete  HRI or Home Care Consult   Complete  SW Recovery Care/Counseling Consult   Not Complete  SW Consult Not Complete Comments   RNCM assigned to case  Palliative Care Screening   Not Applicable  Comments   na  Skilled Nursing Facility   Complete

## 2023-11-29 NOTE — Progress Notes (Signed)
Physical Therapy Treatment Patient Details Name: Autumn Johnston MRN: [id number] DOB: 02-11-1922 Today's Date: 11/29/2023   History of Present Illness Autumn Johnston is a 89 y.o. female with medical history significant of dCHF, HTN, dementia per her daughter, depression with anxiety, CKD-4, colon cancer (s/p colostomy and s/p of colectomy with a chronic large para colostomy hernia), who presents with SOB and cough.     Per her daughter (I called her daughter by phone), patient started coughing with shortness of breath since Friday, which has been progressively worsening, mostly with dry cough.  Patient denies chest pain.  No fever or chills.  No nausea, vomiting, diarrhea or abdominal pain.  Patient is not using oxygen normally, but came in with 4L of oxygen with 97%, which desaturated to 80% when trying to decrease to 2 L oxygen in ED. When I saw patient on floor, patient is requiring 4-6 L oxygen, with tachypnea and moderate acute respiratory distress. She has difficult speaking in full sentence.  Her voice is hoarse.  Denies symptoms of UTI.    PT Comments  Pt seen for PT tx with pt received in bed, nurse & daughter present in room. Pt asleep, PT attempting to awaken pt with cold wet cloth on forehead, sternal rub with pt eventually swatting at PT, partially opening eyes. Pt requires total assist to reposition to midline in bed. Pt able to hold BUE up after PT lets go, demonstrating some strength but not following commands. PT performs BLE PROM with pt not participating. Nurse reports pt fatigue following eventful morning. Pt left in bed with daughter in room. Will continue to follow pt acutely to progress mobility as able. (Of note, daughter reports pt is from LTC where she transfers from bed<>w/c to be wheeled around facility.).   If plan is discharge home, recommend the following: A lot of help with walking and/or transfers;A lot of help with bathing/dressing/bathroom   Can travel by  private vehicle        Equipment Recommendations  Other (comment) (defer to next venue)    Recommendations for Other Services       Precautions / Restrictions Precautions Precautions: Fall;Other (comment) Restrictions Weight Bearing Restrictions: No     Mobility  Bed Mobility Overal bed mobility: Needs Assistance             General bed mobility comments: total assist to reposition to midline in bed    Transfers                        Ambulation/Gait                   Stairs             Wheelchair Mobility     Tilt Bed    Modified Rankin (Stroke Patients Only)       Balance                                            Cognition Arousal: Lethargic Behavior During Therapy: Autumn Johnston for tasks assessed/performed                                            Exercises      General  Comments        Pertinent Vitals/Pain Pain Assessment Pain Assessment: PAINAD Breathing: normal Negative Vocalization: none Facial Expression: smiling or inexpressive Body Language: relaxed Consolability: no need to console PAINAD Score: 0    Home Living                          Prior Function            PT Goals (current goals can now be found in the care plan section) Acute Rehab PT Goals Patient Stated Goal: Pt unable to participate in Goal setting PT Goal Formulation: Patient unable to participate in goal setting Time For Goal Achievement: 12/09/23 Potential to Achieve Goals: Poor Progress towards PT goals: Progressing toward goals    Frequency    Min 1X/week      PT Plan      Co-evaluation              AM-PAC PT "6 Clicks" Mobility   Outcome Measure  Help needed turning from your back to your side while in a flat bed without using bedrails?: A Little Help needed moving from lying on your back to sitting on the side of a flat bed without using bedrails?: A Lot Help needed  moving to and from a bed to a chair (including a wheelchair)?: Total Help needed standing up from a chair using your arms (e.g., wheelchair or bedside chair)?: Total Help needed to walk in hospital room?: Total Help needed climbing 3-5 steps with a railing? : Total 6 Click Score: 9    End of Session Equipment Utilized During Treatment: Oxygen Activity Tolerance: Patient limited by fatigue Patient left: in bed;with call bell/phone within reach;with bed alarm set;with family/visitor present   PT Visit Diagnosis: Unsteadiness on feet (R26.81);Other abnormalities of gait and mobility (R26.89);Muscle weakness (generalized) (M62.81);Difficulty in walking, not elsewhere classified (R26.2)     Time: 3244-0102 PT Time Calculation (min) (ACUTE ONLY): 10 min  Charges:    $Therapeutic Activity: 8-22 mins PT General Charges $$ ACUTE PT VISIT: 1 Visit                     Aleda Grana, PT, DPT 11/29/23, 3:09 PM   Autumn Johnston 11/29/2023, 3:08 PM

## 2023-11-30 ENCOUNTER — Inpatient Hospital Stay: Payer: Medicare Other

## 2023-11-30 DIAGNOSIS — J069 Acute upper respiratory infection, unspecified: Secondary | ICD-10-CM | POA: Diagnosis not present

## 2023-11-30 DIAGNOSIS — U071 COVID-19: Secondary | ICD-10-CM | POA: Diagnosis not present

## 2023-11-30 LAB — BASIC METABOLIC PANEL
Anion gap: 9 (ref 5–15)
BUN: 57 mg/dL — ABNORMAL HIGH (ref 8–23)
CO2: 24 mmol/L (ref 22–32)
Calcium: 8.4 mg/dL — ABNORMAL LOW (ref 8.9–10.3)
Chloride: 107 mmol/L (ref 98–111)
Creatinine, Ser: 1.45 mg/dL — ABNORMAL HIGH (ref 0.44–1.00)
GFR, Estimated: 32 mL/min — ABNORMAL LOW (ref >60)
Glucose, Bld: 86 mg/dL (ref 70–99)
Potassium: 4.7 mmol/L (ref 3.5–5.1)
Sodium: 140 mmol/L (ref 135–145)

## 2023-11-30 LAB — CBC WITH DIFFERENTIAL/PLATELET
Abs Immature Granulocytes: 0.09 10*3/uL — ABNORMAL HIGH (ref 0.00–0.07)
Basophils Absolute: 0 10*3/uL (ref 0.0–0.1)
Basophils Relative: 0 %
Eosinophils Absolute: 0 10*3/uL (ref 0.0–0.5)
Eosinophils Relative: 0 %
HCT: 35.3 % — ABNORMAL LOW (ref 36.0–46.0)
Hemoglobin: 11.2 g/dL — ABNORMAL LOW (ref 12.0–15.0)
Immature Granulocytes: 1 %
Lymphocytes Relative: 5 %
Lymphs Abs: 0.6 10*3/uL — ABNORMAL LOW (ref 0.7–4.0)
MCH: 28.2 pg (ref 26.0–34.0)
MCHC: 31.7 g/dL (ref 30.0–36.0)
MCV: 88.9 fL (ref 80.0–100.0)
Monocytes Absolute: 0.6 10*3/uL (ref 0.1–1.0)
Monocytes Relative: 5 %
Neutro Abs: 11.8 10*3/uL — ABNORMAL HIGH (ref 1.7–7.7)
Neutrophils Relative %: 89 %
Platelets: 349 10*3/uL (ref 150–400)
RBC: 3.97 MIL/uL (ref 3.87–5.11)
RDW: 15 % (ref 11.5–15.5)
WBC: 13.1 10*3/uL — ABNORMAL HIGH (ref 4.0–10.5)
nRBC: 0 % (ref 0.0–0.2)

## 2023-11-30 NOTE — Progress Notes (Addendum)
Nutrition Follow-up  DOCUMENTATION CODES:   Underweight, Severe malnutrition in context of chronic illness  INTERVENTION:   -Continue MVI with minerals daily -Continue Ensure Enlive po to TID, each supplement provides 350 kcal and 20 grams of protein -Continue Magic cup TID with meals, each supplement provides 290 kcal and 9 grams of protein  -Continue 500 mg vitamin C BID -Continue 220 mg zinc sulfate daily x 14 days -Continue feeding assistance with meals  NUTRITION DIAGNOSIS:   Severe Malnutrition related to chronic illness (dementia) as evidenced by severe fat depletion, severe muscle depletion.  Ongoing  GOAL:   Patient will meet greater than or equal to 90% of their needs  Progressing   MONITOR:   PO intake, Supplement acceptance  REASON FOR ASSESSMENT:   Consult Assessment of nutrition requirement/status  ASSESSMENT:   Pt with medical history significant of dCHF, HTN, dementia per her daughter, depression with anxiety, CKD-4, colon cancer (s/p colostomy and s/p of colectomy with a chronic large para colostomy hernia), who presents with shortness of breath and cough.  12/6- s/p BSE- dysphagia 1 diet with thin liquids   Reviewed I/O's: +360 ml x 24 hours and -695 ml since admission   Pt lying in bed at time of visit. She did not respond to voice or touch. No family present at time of visit.   Pt remains with poor oral intake. Noted meal completions 25-50%. Noted pt drank 80% of Ensure at bedside. Magic cup was untouched.   RD would not recommend artifical means of nutrition and hydration given advanced age and dementia.   Case discussed with RN, MD, and SLP. Diet has been advanced to a regular diet with thin liquids, for widest variety of meal selections. Per SLP, pt is at risk for malnutrition regardless of what diet she is on. RD discussed concern of inadequate oral intake and malnutrition. Per MD< plan to advance to regular diet. SLP to provide continued  education on aspiration precautions and safety.   Wt has been stable since admission.   Plan to d/c back to SNF [facility]) once medically stable.   Medications reviewed and include vitamin C, remeron, prednisone, and zinc sulfate.   Labs reviewed.    Diet Order:   Diet Order             DIET - DYS 1 Room service appropriate? Yes; Fluid consistency: Thin  Diet effective now                   EDUCATION NEEDS:   No education needs have been identified at this time  Skin:  Skin Assessment: Skin Integrity Issues: Skin Integrity Issues:: Stage II Stage II: sacrum  Last BM:  11/28/23 (via colostomy)  Height:   Ht Readings from Last 1 Encounters:  11/26/23 5\' 2"  (1.575 m)    Weight:   Wt Readings from Last 1 Encounters:  11/29/23 39.2 kg    Ideal Body Weight:  50 kg  BMI:  Body mass index is 15.81 kg/m.  Estimated Nutritional Needs:   Kcal:  1500-1700  Protein:  75-90 grams  Fluid:  > 1.5 L    Levada Schilling, RD, LDN, CDCES Registered Dietitian III Certified Diabetes Care and Education Specialist Please refer to [facility] for RD and/or RD on-call/weekend/after hours pager

## 2023-11-30 NOTE — Plan of Care (Signed)

## 2023-11-30 NOTE — Progress Notes (Addendum)
Speech Language Pathology Treatment: Dysphagia  Patient Details Name: Autumn Johnston MRN: [id number] DOB: September 10, 1922 Today's Date: 11/30/2023 Time: 6387-5643 SLP Time Calculation (min) (ACUTE ONLY): 20 min  Assessment / Plan / Recommendation Clinical Impression  Pt seen at bedside to assess tolerance of current diet. RN reports poor PO intake on puree/thin liquids. SLP assessed pt safety with advanced textures, including dry graham cracker, softened/moistened graham cracker, puree, and thin liquids via straw. Pt accepted multiple trials of all textures. Extended oral prep of solids, but insignificant oral residue noted. No overt s/s aspiration observed following PO trials.  Recommend consideration of advancing diet to regular solids with thin liquids, to allow full range of choices to maximize opportunity for improved amount of PO intake. Pt is at risk for aspiration given advanced age, dementia, deconditioning, and highly variable mentation. Risk of aspiration will increase with changes in confusion, alertness, agitation, etc, so recommend caution and mindfulness of current level of mentation when choosing items for meals. Softer solids, purees, or holding PO until mentation improves may be considered.  Also recommend consideration of Palliative Care consult to facilitate establishment of appropriate goals of care. Will defer change of diet level to MD. Medical team informed of recommendations. SLP will continue to follow for education with family regarding maximizing safe PO intake given variable mentation. No family present at this time.   HPI HPI: 89yo female admitted from [facility] 11/21/23 with weakness/COVID. PMH: CKD4, colon cancer, HTN, dementia, anxiety, depression. CXR = LLL opacity      SLP Plan  Continue with current plan of care      Recommendations for follow up therapy are one component of a multi-disciplinary discharge planning process, led by the attending physician.   Recommendations may be updated based on patient status, additional functional criteria and insurance authorization.    Recommendations  Diet recommendations: Regular;Thin liquid (to allow full range of choices to facilitate adequate PO intake) Medication Administration: Crushed with puree Supervision: Trained caregiver to feed patient;Full supervision/cueing for compensatory strategies Compensations: Minimize environmental distractions;Slow rate;Small sips/bites Postural Changes and/or Swallow Maneuvers: Seated upright 90 degrees;Upright 30-60 min after meal        Oral care BID     Dysphagia, unspecified (R13.10)     Continue with current plan of care    Autumn Johnston, [facility], CCC-SLP Speech Language Pathologist  Autumn Johnston 11/30/2023, 11:57 AM

## 2023-11-30 NOTE — Progress Notes (Signed)
Progress Note   Patient: Autumn Johnston UJW:[id number] DOB: Jun 15, 1922 DOA: 11/21/2023     9 DOS: the patient was seen and examined on 11/30/2023      Brief hospital course: Autumn Johnston is a 89 y.o. female with medical history significant of dCHF, HTN, dementia per her daughter, depression with anxiety, CKD-4, colon cancer (s/p colostomy and s/p of colectomy with a chronic large para colostomy hernia), who presents with SOB and cough. Tested positive for COVID. Cxray showed left basilar opacity admitted for COVID pneumonia.   Assessment and Plan: Pneumonia due to COVID-19 virus  Acute respiratory failure with hypoxia (HCC):  O2 weaned to 4 L supplemental O2. Patient received Rocephin and azithromycin Continue bronchodilators Continue as needed Mucinex. Continue isolation precautions. Continue prednisone taper Continue supplemental oxygen to maintain oxygen saturation above 92%.   Mild leukocytosis Urinalysis requested I have reviewed chest x-ray which does not appear significantly different from presentation No indication for antibiotics as x-ray findings likely in the setting of COVID 19 infection.  Possible aspiration event Diet has been advanced after speech therapist eval   Chronic diastolic CHF (congestive heart failure) (HCC):  No CHF exacerbation. Encourage oral diet, fluids. Hold diuretics for now given renal function and poor oral intake   Essential hypertension: Blood pressure 94/49 IV hydralazine as needed Continue to hold amlodipine and Imdur due to softer blood pressure   Acute on Chronic kidney disease (CKD), stage IV (severe) [facility]):  Patient has worsening renal function.  Recent baseline creatinine 1.13 on 10/04/2023.  Avoid nephrotoxic drugs.   Dementia with behavioral disturbances (HCC) Continue delirium precautions. Continue fall and aspiration precautions. Supportive care.   Depression with anxiety Continue as needed Xanax.   Large  para colostomy hernia - s/p colostomy and s/p of colectomy with a chronic large para colostomy hernia. Patient's daughter wishes only conservative management.   Protein-calorie malnutrition, severe: Body weight 43.7 kg, BMI 17.4 Continue supplements, Ensure    Subjective:  Patient appears more calm today compared to yesterday I updated patient's daughter over the phone She denies nausea vomiting chest pain or cough   Physical Exam:   General - Elderly ill Caucasian thin built female, mild respiratory distress HEENT - PERRLA, EOMI, atraumatic head, non tender sinuses. Lung - Clear, basal rales, diffuse rhonchi, wheezes. Heart - S1, S2 heard, no murmurs, rubs, no pedal edema. Abdomen - Soft, non tender, chronic large para colostomy hernia noted Neuro - sleepy, lethargic, unable to do full neuro exam Skin - Warm and dry.     Family Communication: Discussed with daughter, she understand and agree. All questions answereed.     Disposition: Status is: Inpatient Remains inpatient appropriate because: COVID pneumonia  Planned Discharge Destination: Skilled nursing facility     Time spent: 38 minutes   Data Reviewed:    Latest Ref Rng & Units 11/30/2023    3:46 AM 11/27/2023    7:02 AM 11/26/2023    6:50 AM  CBC  WBC 4.0 - 10.5 K/uL 13.1  9.8  9.3   Hemoglobin 12.0 - 15.0 g/dL 56.2  13.0  86.5   Hematocrit 36.0 - 46.0 % 35.3  39.5  35.3   Platelets 150 - 400 K/uL 349  417  458     Vitals:   11/29/23 2333 11/30/23 0600 11/30/23 0800 11/30/23 1419  BP: (!) 162/68 (!) 143/68 (!) 140/68 120/75  Pulse: 77 78 76 76  Resp: 20 20 20 16   Temp: (!) 97.4 F (36.3  C) 98.8 F (37.1 C) 98.6 F (37 C) (!) 97.5 F (36.4 C)  TempSrc:   Oral   SpO2: 95% 100% 100% 96%  Weight:      Height:         Author: Loyce Dys, MD 11/30/2023 5:43 PM  For on call review [email or web address].

## 2023-12-01 DIAGNOSIS — J069 Acute upper respiratory infection, unspecified: Secondary | ICD-10-CM | POA: Diagnosis not present

## 2023-12-01 DIAGNOSIS — Z7189 Other specified counseling: Secondary | ICD-10-CM

## 2023-12-01 DIAGNOSIS — F03C11 Unspecified dementia, severe, with agitation: Secondary | ICD-10-CM | POA: Diagnosis not present

## 2023-12-01 DIAGNOSIS — U071 COVID-19: Secondary | ICD-10-CM | POA: Diagnosis not present

## 2023-12-01 LAB — BASIC METABOLIC PANEL
Anion gap: 5 (ref 5–15)
BUN: 44 mg/dL — ABNORMAL HIGH (ref 8–23)
CO2: 25 mmol/L (ref 22–32)
Calcium: 8.2 mg/dL — ABNORMAL LOW (ref 8.9–10.3)
Chloride: 106 mmol/L (ref 98–111)
Creatinine, Ser: 1.21 mg/dL — ABNORMAL HIGH (ref 0.44–1.00)
GFR, Estimated: 40 mL/min — ABNORMAL LOW (ref >60)
Glucose, Bld: 83 mg/dL (ref 70–99)
Potassium: 4.4 mmol/L (ref 3.5–5.1)
Sodium: 136 mmol/L (ref 135–145)

## 2023-12-01 LAB — CBC WITH DIFFERENTIAL/PLATELET
Abs Immature Granulocytes: 0.12 10*3/uL — ABNORMAL HIGH (ref 0.00–0.07)
Basophils Absolute: 0 10*3/uL (ref 0.0–0.1)
Basophils Relative: 0 %
Eosinophils Absolute: 0 10*3/uL (ref 0.0–0.5)
Eosinophils Relative: 0 %
HCT: 32.9 % — ABNORMAL LOW (ref 36.0–46.0)
Hemoglobin: 10.5 g/dL — ABNORMAL LOW (ref 12.0–15.0)
Immature Granulocytes: 1 %
Lymphocytes Relative: 6 %
Lymphs Abs: 0.8 10*3/uL (ref 0.7–4.0)
MCH: 28.3 pg (ref 26.0–34.0)
MCHC: 31.9 g/dL (ref 30.0–36.0)
MCV: 88.7 fL (ref 80.0–100.0)
Monocytes Absolute: 0.6 10*3/uL (ref 0.1–1.0)
Monocytes Relative: 5 %
Neutro Abs: 11.5 10*3/uL — ABNORMAL HIGH (ref 1.7–7.7)
Neutrophils Relative %: 88 %
Platelets: 299 10*3/uL (ref 150–400)
RBC: 3.71 MIL/uL — ABNORMAL LOW (ref 3.87–5.11)
RDW: 15.3 % (ref 11.5–15.5)
WBC: 13.1 10*3/uL — ABNORMAL HIGH (ref 4.0–10.5)
nRBC: 0 % (ref 0.0–0.2)

## 2023-12-01 NOTE — Plan of Care (Signed)

## 2023-12-01 NOTE — Progress Notes (Signed)
SLP Cancellation Note  Patient Details Name: Autumn Johnston MRN: [id number] DOB: Apr 24, 1922   Cancelled treatment:       Reason Eval/Treat Not Completed:  (chart reviewed; consulted NSG and Team re: pt's status)  Per pt's NSG today, even w/ the Regular consistency diet upgrade, she did NOT eat anything at the breakfast meal.  for lunch, she ate ~5 bites of chopped/cut spaghetti and a few of sips.  she stated "no more please", per NSG report.   W/ regard to her diet consistency, it was upgraded (liberated?) to a Regular consistency yest. in order to encourage "more intake".  Per the Dietician notes, pt was already Malnourished prior to admit. Pt is also 89ys w/ Dementia in a Facility setting. These factors do impact oral intake.   Also per a chart note, she was on a Dysphagia level 2 food consistency diet at her Facility: a "pre-admit diet of Dys 2 (finely chopped) solids".  The current Regular diet consistency is a more solid consistency than pt's baseline, and would need to be chopped well for pt at meals, as pt is Missing Dentition, and has Dementia.   No further skilled ST services indicated at this time as pt is tolerating po's w/out over s/s of aspiration per NSG. NSG is following general aspiration precautions w/ pt; giving PIlls CRUSHED in Puree. Will defer any further diet consistency changes to the MD and Team; noted Palliative Care is following for overall GOC.         Autumn Som, MS, CCC-SLP Speech Language Pathologist Rehab Services; [facility] [phone] (ascom) Autumn Johnston 12/01/2023, 3:34 PM

## 2023-12-01 NOTE — Consult Note (Signed)
Consultation Note Date: 12/01/2023   Patient Name: Autumn Johnston  DOB: July 27, 1922  MRN: [id number]  Age / Sex: 89 y.o., female  PCP: Marguarite Arbour, MD Referring Physician: Pennie Banter, DO  Reason for Consultation:  goals of care  HPI/Patient Profile: 89 y.o. female  with past medical history of dementia, CHF, CKD4, colon cancer s/p colectomy, living at [facility] admitted on 11/21/2023 with pneumonia due to Covid. Palliative medicine consulted for GOC.    Primary Decision Maker HCPOA daughter Riley Kill is first HCPOA, Bentlie Dorazio is second HCPOA per document on patient's chart  Discussion: Chart reviewed.  Patient is recovering from Covid. Has had some agitation at times.  Poor po intake.  On eval patient awake and alert. Sitting in bed. Tells me she wants the lawyers to check and make sure everything is being done correctly. She is unable to participate in GOC discussion due to her dementia.  I was able to speak via phon with patient's daugther/HCPOA- Autumn Johnston.  Patient living at [facility] prior to admission. Bed bound. Sleeping mostly during the day. Her appetite has been greatly decreased and she has lost weight.  We discussed GOC. Tinnie Gens notes that her primary GOC are for patient to be comfortable, calm, and not suffer. She notes that patient has expressed to her that she is ready to die. If Caterin worsened during this admission or if she declines after admission- then goal would be comfort and allowing natural dying process.  Hospice services and philosophy of care were discussed.  Tinnie Gens would like patient to have hospice services at discharge.    SUMMARY OF RECOMMENDATIONS -Continue current interventions   -Discharge with hospice if able to go back to [facility] with long term care- if she needs to go back under rehab for financial reasons then agrees to Palliative with  low threshold to transition to hospice -If returned to hospital would want comfort measures only  Code Status/Advance Care Planning: DNR   Prognosis:   < 6 months  Discharge Planning: To Be Determined  Primary Diagnoses: Present on Admission:  Acute respiratory disease due to COVID-19 virus  Essential hypertension  Chronic kidney disease (CKD), stage IV (severe) (HCC)  Dementia (HCC)  Chronic diastolic CHF (congestive heart failure) (HCC)  Depression with anxiety  Protein-calorie malnutrition, severe  Acute respiratory failure with hypoxia (HCC)   Review of Systems  Physical Exam  Vital Signs: BP 128/70 (BP Location: Left Arm)   Pulse 74   Temp 98.2 F (36.8 C) (Oral)   Resp 16   Ht 5\' 2"  (1.575 m)   Wt 37.2 kg   SpO2 92%   BMI 15.00 kg/m  Pain Scale: 0-10 POSS *See Group Information*: S-Acceptable,Sleep, easy to arouse Pain Score: 0-No pain   SpO2: SpO2: 92 % O2 Device:SpO2: 92 % O2 Flow Rate: .O2 Flow Rate (L/min): 2 L/min  IO: Intake/output summary:  Intake/Output Summary (Last 24 hours) at 12/01/2023 1541 Last data filed at 11/30/2023 2222 Gross per 24  hour  Intake 120 ml  Output --  Net 120 ml    LBM: Last BM Date : 11/28/23 Baseline Weight: Weight: 41.5 kg Most recent weight: Weight: 37.2 kg       Thank you for this consult. Palliative medicine will continue to follow and assist as needed.  Time Total: 90 minutes  Signed by: Ocie Bob, AGNP-C Palliative Medicine  Time includes:   Preparing to see the patient (e.g., review of tests) Obtaining and/or reviewing separately obtained history Performing a medically necessary appropriate examination and/or evaluation Counseling and educating the patient/family/caregiver Ordering medications, tests, or procedures Referring and communicating with other health care professionals (when not reported separately) Documenting clinical information in the electronic or other health  record Independently interpreting results (not reported separately) and communicating results to the patient/family/caregiver Care coordination (not reported separately) Clinical documentation   Please contact Palliative Medicine Team phone at [phone] for questions and concerns.  For individual provider: See Loretha Stapler

## 2023-12-01 NOTE — Progress Notes (Signed)
Patient is confused an unable to express where her pain is located.

## 2023-12-01 NOTE — Progress Notes (Addendum)
Progress Note   Patient: Autumn Johnston WNU:[id number] DOB: [birth year] DOA: 11/21/2023     10 DOS: the patient was seen and examined on 12/01/2023      Brief hospital course: "Autumn Johnston is a 89 y.o. female with medical history significant of dCHF, HTN, dementia per her daughter, depression with anxiety, CKD-4, colon cancer (s/p colostomy and s/p of colectomy with a chronic large para colostomy hernia), who presents with SOB and cough. Tested positive for COVID. Cxray showed left basilar opacity admitted for COVID pneumonia." See H&P for full HPI on admission & ED course.  Further hospital course and management as outlined below.    Assessment and Plan: Pneumonia due to COVID-19 virus  Acute respiratory failure with hypoxia (HCC):  O2 weaned to 4 >> 2 L/min today Treated with Rocephin and azithromycin Continue bronchodilators Continue as needed Mucinex. Now off isolation precautions. Continue prednisone taper Continue supplemental oxygen to maintain oxygen saturation above 92%.   Mild leukocytosis Urinalysis requested - appears not collected Chest x-ray not appear significantly different from presentation No indication for antibiotics as x-ray findings likely in the setting of COVID 19 infection. Monitor CBC and fever curve  Possible aspiration event Diet has been advanced per SLP   Chronic diastolic CHF (congestive heart failure) (HCC):  No CHF exacerbation. Encourage oral diet, fluids. Hold diuretics for now given renal function and poor oral intake   Essential hypertension: BP's have been soft, meds have been held and BP's stable and controlled. IV hydralazine as needed Continue to hold amlodipine and Imdur & resume when appropriate   Acute on Chronic kidney disease (CKD), stage IV (severe) [facility]):  Patient has worsening renal function.  Recent baseline creatinine 1.13 on 10/04/2023.  Avoid nephrotoxic drugs.   Dementia with behavioral disturbances  (HCC) Continue delirium precautions. Continue fall and aspiration precautions. Supportive care.   Depression with anxiety Continue as needed Xanax.   Large para colostomy hernia - s/p colostomy and s/p of colectomy with a chronic large para colostomy hernia. Patient's daughter wishes only conservative management.   Protein-calorie malnutrition, severe: Body weight 43.7 kg, BMI 17.4 Continue supplements, Ensure  Pressure injury of skin - POA I agree with the wound description/s as outlined. Continue wound care, frequent repositionining, close monitoring Pressure Injury 08/09/23 Sacrum Stage 2 -  Partial thickness loss of dermis presenting as a shallow open injury with a red, pink wound bed without slough. skin tear to gluteal cleft (Active)  08/09/23 0200  Location: Sacrum  Location Orientation:   Staging: Stage 2 -  Partial thickness loss of dermis presenting as a shallow open injury with a red, pink wound bed without slough.  Wound Description (Comments): skin tear to gluteal cleft  Present on Admission: No        Subjective:  Patient was sleeping and woke easily to voice this AM.  She denies complaints including pain or feeling sick.  No acute events reported.    Physical Exam:   General exam: awake, alert, no acute distress, frail appearing HEENT: moist mucus membranes, hearing grossly normal  Respiratory system: CTAB, no wheezes, rales or rhonchi, normal respiratory effort. Cardiovascular system: normal S1/S2, RRR, no JVD, murmurs, rubs, gallops, no pedal edema.   Gastrointestinal system: soft, NT, ND Central nervous system: A&O xself. no gross focal neurologic deficits, normal speech Extremities: moves all, no edema, normal tone Skin: dry, intact, normal temperature Psychiatry: normal mood, congruent affect      Family Communication: None present on rounds.  Will attempt to call as time allows this afternoon. No new medical updates at this time.      Disposition:  Status is: Inpatient Remains inpatient appropriate because: COVID pneumonia   Planned Discharge Destination: Skilled nursing facility     Time spent: 35 minutes   Data Reviewed:    Latest Ref Rng & Units 12/01/2023    4:07 AM 11/30/2023    3:46 AM 11/27/2023    7:02 AM  CBC  WBC 4.0 - 10.5 K/uL 13.1  13.1  9.8   Hemoglobin 12.0 - 15.0 g/dL 28.3  15.1  76.1   Hematocrit 36.0 - 46.0 % 32.9  35.3  39.5   Platelets 150 - 400 K/uL 299  349  417     Vitals:   12/01/23 0446 12/01/23 0530 12/01/23 0834 12/01/23 1113  BP: 134/61 136/62 133/64 123/63  Pulse: 64 63 68 78  Resp: 15 16  16   Temp: 99 F (37.2 C) 98.8 F (37.1 C) 98.2 F (36.8 C) 98.4 F (36.9 C)  TempSrc:  Axillary Oral Oral  SpO2: 95% 96% 94% 95%  Weight:      Height:         Author: Pennie Banter, DO 12/01/2023 3:08 PM  For on call review [email or web address].

## 2023-12-02 DIAGNOSIS — F03B11 Unspecified dementia, moderate, with agitation: Secondary | ICD-10-CM | POA: Diagnosis not present

## 2023-12-02 DIAGNOSIS — Z515 Encounter for palliative care: Secondary | ICD-10-CM

## 2023-12-02 DIAGNOSIS — Z7189 Other specified counseling: Secondary | ICD-10-CM | POA: Diagnosis not present

## 2023-12-02 DIAGNOSIS — U071 COVID-19: Secondary | ICD-10-CM | POA: Diagnosis not present

## 2023-12-02 DIAGNOSIS — J069 Acute upper respiratory infection, unspecified: Secondary | ICD-10-CM | POA: Diagnosis not present

## 2023-12-02 LAB — CBC
HCT: 30 % — ABNORMAL LOW (ref 36.0–46.0)
Hemoglobin: 9.6 g/dL — ABNORMAL LOW (ref 12.0–15.0)
MCH: 28.3 pg (ref 26.0–34.0)
MCHC: 32 g/dL (ref 30.0–36.0)
MCV: 88.5 fL (ref 80.0–100.0)
Platelets: 271 10*3/uL (ref 150–400)
RBC: 3.39 MIL/uL — ABNORMAL LOW (ref 3.87–5.11)
RDW: 15.4 % (ref 11.5–15.5)
WBC: 8.3 10*3/uL (ref 4.0–10.5)
nRBC: 0 % (ref 0.0–0.2)

## 2023-12-02 MED ORDER — HALOPERIDOL LACTATE 5 MG/ML IJ SOLN
1.0000 mg | Freq: Four times a day (QID) | INTRAMUSCULAR | Status: DC | PRN
  Administered 2023-12-02: 1 mg via INTRAVENOUS
  Filled 2023-12-02: qty 1

## 2023-12-02 MED ORDER — MIRTAZAPINE 15 MG PO TABS
15.0000 mg | ORAL_TABLET | Freq: Every day | ORAL | Status: DC
  Administered 2023-12-02 – 2023-12-07 (×6): 15 mg via ORAL
  Filled 2023-12-02 (×6): qty 1

## 2023-12-02 MED ORDER — TRAZODONE HCL 50 MG PO TABS
25.0000 mg | ORAL_TABLET | Freq: Every evening | ORAL | Status: DC | PRN
  Administered 2023-12-02 – 2023-12-07 (×3): 25 mg via ORAL
  Filled 2023-12-02 (×3): qty 1

## 2023-12-02 MED ORDER — ACETAMINOPHEN 500 MG PO TABS
1000.0000 mg | ORAL_TABLET | Freq: Three times a day (TID) | ORAL | Status: DC
  Administered 2023-12-03 – 2023-12-08 (×13): 1000 mg via ORAL
  Filled 2023-12-02 (×12): qty 2

## 2023-12-02 NOTE — Progress Notes (Addendum)
Progress Note   Patient: Autumn Johnston ZYS:[id number] DOB: 1922/02/20 DOA: 11/21/2023     11 DOS: the patient was seen and examined on 12/02/2023      Brief hospital course: "Keyetta Devane is a 89 y.o. female with medical history significant of dCHF, HTN, dementia per her daughter, depression with anxiety, CKD-4, colon cancer (s/p colostomy and s/p of colectomy with a chronic large para colostomy hernia), who presents with SOB and cough. Tested positive for COVID. Cxray showed left basilar opacity admitted for COVID pneumonia." See H&P for full HPI on admission & ED course.  Further hospital course and management as outlined below.    Assessment and Plan: Pneumonia due to COVID-19 virus  Acute respiratory failure with hypoxia (HCC):  O2 weaned to 4 >> 2 L/min today Treated with Rocephin and azithromycin Continue bronchodilators Continue as needed Mucinex. Now off isolation precautions. Completed prednisone taper Wean O2 as tolerated, maintain oxygen saturation above 92%.   Mild leukocytosis - resolved Urinalysis ordered by appears was not collected Chest x-ray not appear significantly different from prior No indication for antibiotics as x-ray findings likely in the setting of COVID 19 infection. Monitor CBC and fever curve  Possible aspiration event Diet has been advanced per SLP On dysphagia 2 diet (baseline diet at her facility)   Chronic diastolic CHF (congestive heart failure) (HCC):  No CHF exacerbation. Encourage oral diet, fluids. Hold diuretics for now given renal function and poor oral intake   Essential hypertension: BP's have been soft, meds have been held and BP's stable and controlled. IV hydralazine as needed Continue to hold amlodipine and Imdur & resume when appropriate   Acute on Chronic kidney disease (CKD), stage IV (severe) [facility]):  Patient has worsening renal function.  Recent baseline creatinine 1.13 on 10/04/2023.  Avoid nephrotoxic  drugs.   Dementia with behavioral disturbances (HCC) Continue delirium precautions. Continue fall and aspiration precautions. Supportive care.   Depression with anxiety Continue as needed Xanax.   Large para colostomy hernia - s/p colostomy and s/p of colectomy with a chronic large para colostomy hernia. Patient's daughter wishes only conservative management.   Protein-calorie malnutrition, severe: Body weight 43.7 kg, BMI 17.4 Continue supplements, Ensure  Pressure injury of skin - POA I agree with the wound description/s as outlined. Continue wound care, frequent repositionining, close monitoring Pressure Injury 08/09/23 Sacrum Stage 2 -  Partial thickness loss of dermis presenting as a shallow open injury with a red, pink wound bed without slough. skin tear to gluteal cleft (Active)  08/09/23 0200  Location: Sacrum  Location Orientation:   Staging: Stage 2 -  Partial thickness loss of dermis presenting as a shallow open injury with a red, pink wound bed without slough.  Wound Description (Comments): skin tear to gluteal cleft  Present on Admission: No        Subjective:  Patient somnolent when seen this AM.  Per nursing staff, pt was up all night worked up and did not sleep overnight.  Pt arouses just briefly to stimulation but quickly falls back to sleep.    Physical Exam:   General exam: somnolent, arouses briefly, frail appearing HEENT: moist mucus membranes, hearing grossly normal  Respiratory system: CTAB, no wheezes, rales or rhonchi, normal respiratory effort. Cardiovascular system: normal S1/S2, RRR, no pedal edema.   Gastrointestinal system: soft, NT, ND Central nervous system: A&O xself. no gross focal neurologic deficits, normal speech Extremities: moves all, no edema, normal tone Skin: dry, intact, normal temperature  Psychiatry: normal mood, congruent affect      Family Communication: None present on rounds. Will attempt to call as time allows this  afternoon. No new medical updates at this time.     Disposition:  Status is: Inpatient Remains inpatient appropriate because: awaiting return to her facility. Medically stable.   Planned Discharge Destination: Skilled nursing facility     Time spent: 35 minutes   Data Reviewed:    Latest Ref Rng & Units 12/02/2023    4:43 AM 12/01/2023    4:07 AM 11/30/2023    3:46 AM  CBC  WBC 4.0 - 10.5 K/uL 8.3  13.1  13.1   Hemoglobin 12.0 - 15.0 g/dL 9.6  56.4  33.2   Hematocrit 36.0 - 46.0 % 30.0  32.9  35.3   Platelets 150 - 400 K/uL 271  299  349     Vitals:   12/01/23 2000 12/02/23 0325 12/02/23 0348 12/02/23 1127  BP: 128/62 (!) 140/65  (!) 140/59  Pulse: 71 (!) 59  (!) 57  Resp: 17 (!) 22  18  Temp: 98.3 F (36.8 C) (!) 97.5 F (36.4 C)    TempSrc: Oral     SpO2:  97%  96%  Weight:   38.4 kg   Height:         Author: Pennie Banter, DO 12/02/2023 1:37 PM  For on call review [email or web address].

## 2023-12-02 NOTE — Progress Notes (Signed)
Occupational Therapy Treatment Patient Details Name: Autumn Johnston MRN: [id number] DOB: 10/10/1922 Today's Date: 12/02/2023   History of present illness Autumn Johnston is a 89 y.o. female with medical history significant of dCHF, HTN, dementia per her daughter, depression with anxiety, CKD-4, colon cancer (s/p colostomy and s/p of colectomy with a chronic large para colostomy hernia), who presents with SOB and cough. Patient is not using oxygen normally, but came in with 4L of oxygen with 97%, which desaturated to 80% when trying to decrease to 2 L oxygen in ED.   OT comments  Autumn Johnston was seen for OT treatment on this date. Upon arrival to room pt alert and yelling to RN. Agitated t/o session reports worried about how she will pay for the bill then repeatedly requesting a pill. Attempts made to console and redirect. MAX A exit bed, tolerated <1 min sitting prior to initiating return to bed with MIN A to complete. Pt making limited progress toward goals, will re-assess next session. Discharge recommendation updated to reflect pt limited participation in skilled therapy.       If plan is discharge home, recommend the following:  Help with stairs or ramp for entrance;Supervision due to cognitive status;Two people to help with walking and/or transfers;Two people to help with bathing/dressing/bathroom   Equipment Recommendations  None recommended by OT (defer)    Recommendations for Other Services      Precautions / Restrictions Precautions Precautions: Fall Restrictions Weight Bearing Restrictions Per Provider Order: No       Mobility Bed Mobility Overal bed mobility: Needs Assistance Bed Mobility: Supine to Sit, Sit to Supine     Supine to sit: Max assist Sit to supine: Min assist        Transfers                   General transfer comment: deferred 2/2 agitation and pt self-initiating return to bed     Balance Overall balance assessment: Needs  assistance Sitting-balance support: Feet supported Sitting balance-Leahy Scale: Poor   Postural control: Posterior lean                                 ADL either performed or assessed with clinical judgement   ADL Overall ADL's : Needs assistance/impaired                                       General ADL Comments: TOTAL A don B socks at bed level.      Cognition Arousal: Alert Behavior During Therapy: Agitated, Anxious Overall Cognitive Status: No family/caregiver present to determine baseline cognitive functioning                                 General Comments: Agitated t/o session worried about how she will pay for the bill then repeatedly requesting a pill "God give me a pill"              General Comments yelling t/o sessin not redirectable    Pertinent Vitals/ Pain       Pain Assessment Pain Assessment: PAINAD Breathing: normal Negative Vocalization: repeated troubled calling out, loud moaning/groaning, crying Facial Expression: sad, frightened, frown Body Language: tense, distressed pacing, fidgeting Consolability: unable to console, distract or reassure  PAINAD Score: 6 Pain Location: reports stomach and head hurts Pain Intervention(s): Patient requesting pain meds-RN notified   Frequency  Min 1X/week        Progress Toward Goals  OT Goals(current goals can now be found in the care plan section)  Progress towards OT goals: OT to reassess next treatment      AM-PAC OT "6 Clicks" Daily Activity     Outcome Measure   Help from another person eating meals?: A Lot Help from another person taking care of personal grooming?: A Lot Help from another person toileting, which includes using toliet, bedpan, or urinal?: A Lot Help from another person bathing (including washing, rinsing, drying)?: A Lot Help from another person to put on and taking off regular upper body clothing?: A Lot Help from another person  to put on and taking off regular lower body clothing?: A Lot 6 Click Score: 12    End of Session    OT Visit Diagnosis: Unsteadiness on feet (R26.81)   Activity Tolerance Treatment limited secondary to agitation   Patient Left in bed;with call bell/phone within reach;with bed alarm set   Nurse Communication Patient requests pain meds        Time: 2025-4270 OT Time Calculation (min): 9 min  Charges: OT General Charges $OT Visit: 1 Visit OT Treatments $Self Care/Home Management : 8-22 mins  Kathie Dike, M.S. OTR/L  12/02/23, 2:44 PM  ascom [phone]

## 2023-12-02 NOTE — Care Management Important Message (Signed)
Important Message  Patient Details  Name: Autumn Johnston MRN: [id number] Date of Birth: November 25, 1922   Important Message Given:  Yes - Medicare IM     Olegario Messier A Norina Cowper 12/02/2023, 12:11 PM

## 2023-12-02 NOTE — Progress Notes (Signed)
Physical Therapy Treatment Patient Details Name: Autumn Johnston MRN: [id number] DOB: June 10, 1922 Today's Date: 12/02/2023   History of Present Illness Autumn Johnston is a 89 y.o. female with medical history significant of dCHF, HTN, dementia per her daughter, depression with anxiety, CKD-4, colon cancer (s/p colostomy and s/p of colectomy with a chronic large para colostomy hernia), who presents with SOB and cough.     Per her daughter (I called her daughter by phone), patient started coughing with shortness of breath since Friday, which has been progressively worsening, mostly with dry cough.  Patient denies chest pain.  No fever or chills.  No nausea, vomiting, diarrhea or abdominal pain.  Patient is not using oxygen normally, but came in with 4L of oxygen with 97%, which desaturated to 80% when trying to decrease to 2 L oxygen in ED. When I saw patient on floor, patient is requiring 4-6 L oxygen, with tachypnea and moderate acute respiratory distress. She has difficult speaking in full sentence.  Her voice is hoarse.  Denies symptoms of UTI.    PT Comments  Pt obtended, able to tersely acknowledge PT/arrival, struggled to give much active assist with P/AAROM exercises, on arrival L LE crossed over R and hip ADductors appear to be tight, positioned in more neutral at end of session, however pt could not answer questions about this (vs baseline) or show awareness of positioning and/or explanation in this regard.  Pt able to infrequently show some AROM in LEs, however session, frankly, was more PROM with lethargic pt with little 2 way communication despite a lot of cuing and attempts to wake/encourage pt.  Will continue PT to to further assess ability to meaningfully participate.   If plan is discharge home, recommend the following: A lot of help with walking and/or transfers;A lot of help with bathing/dressing/bathroom   Can travel by private vehicle        Equipment Recommendations    (defer to next venue of care)    Recommendations for Other Services       Precautions / Restrictions Precautions Precautions: Fall;Other (comment) Restrictions Weight Bearing Restrictions Per Provider Order: No     Mobility  Bed Mobility               General bed mobility comments: pt did not open eyes, showed little AROM and was resistant to full ROM type movement, deferred for pt safety    Transfers                        Ambulation/Gait                   Stairs             Wheelchair Mobility     Tilt Bed    Modified Rankin (Stroke Patients Only)       Balance                                            Cognition Arousal: Obtunded Behavior During Therapy: Flat affect Overall Cognitive Status: No family/caregiver present to determine baseline cognitive functioning                                          Exercises General  Exercises - Lower Extremity Ankle Circles/Pumps: PROM, 5 reps, Both Heel Slides: AAROM, PROM, 10 reps, Both Hip ABduction/ADduction: PROM, 5 reps, Both (L hip adduction contracture, limited tolerace for ABd >~5*, R less severe but with poor ROM tolerance) Straight Leg Raises: AAROM, 5 reps, Both    General Comments General comments (skin integrity, edema, etc.): Pt's eyes closed t/o, minimal acknowledgement of PT during intro, exercises and in general only able to groan occasionally showing dislike with certain exercises/activity      Pertinent Vitals/Pain Pain Assessment Pain Assessment: Faces Faces Pain Scale: Hurts a little bit Pain Location: indicates some minimal pain with LE ROM acts Pain Intervention(s): Limited activity within patient's tolerance    Home Living                          Prior Function            PT Goals (current goals can now be found in the care plan section) Progress towards PT goals: Not progressing toward goals - comment  (lethargic, reflexively resistant to most LE acts)    Frequency    Min 1X/week      PT Plan      Co-evaluation              AM-PAC PT "6 Clicks" Mobility   Outcome Measure  Help needed turning from your back to your side while in a flat bed without using bedrails?: Total Help needed moving from lying on your back to sitting on the side of a flat bed without using bedrails?: Total Help needed moving to and from a bed to a chair (including a wheelchair)?: Total Help needed standing up from a chair using your arms (e.g., wheelchair or bedside chair)?: Total Help needed to walk in hospital room?: Total Help needed climbing 3-5 steps with a railing? : Total 6 Click Score: 6    End of Session   Activity Tolerance: Patient limited by fatigue Patient left: with call bell/phone within reach;with bed alarm set   PT Visit Diagnosis: Unsteadiness on feet (R26.81);Other abnormalities of gait and mobility (R26.89);Muscle weakness (generalized) (M62.81);Difficulty in walking, not elsewhere classified (R26.2)     Time: 1610-9604 PT Time Calculation (min) (ACUTE ONLY): 10 min  Charges:    $Therapeutic Exercise: 8-22 mins PT General Charges $$ ACUTE PT VISIT: 1 Visit                     Malachi Pro, DPT 12/02/2023, 1:13 PM

## 2023-12-02 NOTE — Plan of Care (Signed)
  Problem: Respiratory: Goal: Will maintain a patent airway Outcome: Progressing   Problem: Respiratory: Goal: Complications related to the disease process, condition or treatment will be avoided or minimized Outcome: Progressing   Problem: Coping: Goal: Psychosocial and spiritual needs will be supported Outcome: Progressing   Problem: Education: Goal: Knowledge of risk factors and measures for prevention of condition will improve Outcome: Progressing

## 2023-12-02 NOTE — Progress Notes (Signed)
Daily Progress Note   Patient Name: Autumn Johnston       Date: 12/02/2023 DOB: 03/05/1922  Age: 89 y.o. MRN#: [id number] Attending Physician: Pennie Banter, DO Primary Care Physician: Marguarite Arbour, MD Admit Date: 11/21/2023  Reason for Consultation/Follow-up: Establishing goals of care  Patient Profile/HPI:   89 y.o. female  with past medical history of dementia, CHF, CKD4, colon cancer s/p colectomy, living at [facility] admitted on 11/21/2023 with pneumonia due to Covid. Palliative medicine consulted for GOC.    Subjective: Chart reviewed including labs, progress notes, imaging from this and previous encounters.  On evaluation patient was very agitated. Yelling out loudly for "a pill". She was very distressed. Discussed with nursing and requested they administer prn xanax as ordered.  Per TOC patient is in LTC at [facility] and can return there as long term care.  Discussed with daughter- she is in agreement to return to [facility] with hospice.   Review of Systems  Unable to perform ROS: Dementia     Physical Exam Vitals and nursing note reviewed.  Constitutional:      General: She is in acute distress.     Appearance: She is cachectic.     Comments: frail  Cardiovascular:     Rate and Rhythm: Normal rate.  Pulmonary:     Effort: Pulmonary effort is normal.  Skin:    Coloration: Skin is pale.  Neurological:     Mental Status: She is disoriented.  Psychiatric:     Comments: agitated             Vital Signs: BP (!) 140/59 (BP Location: Left Arm)   Pulse (!) 57   Temp (!) 97.5 F (36.4 C)   Resp 18   Ht 5\' 2"  (1.575 m)   Wt 38.4 kg   SpO2 96%   BMI 15.48 kg/m  SpO2: SpO2: 96 % O2 Device: O2 Device: Nasal Cannula O2 Flow Rate: O2 Flow Rate (L/min):  2 L/min  Intake/output summary:  Intake/Output Summary (Last 24 hours) at 12/02/2023 1513 Last data filed at 12/02/2023 1237 Gross per 24 hour  Intake 237 ml  Output 400 ml  Net -163 ml   LBM: Last BM Date : 12/02/23 Baseline Weight: Weight: 41.5 kg Most recent weight: Weight: 38.4 kg  Palliative Assessment/Data: PPS: 30%      Patient Active Problem List   Diagnosis Date Noted   Acute respiratory disease due to COVID-19 virus 11/21/2023   Chronic diastolic CHF (congestive heart failure) (HCC) 11/21/2023   Depression with anxiety 11/21/2023   Acute respiratory failure with hypoxia (HCC) 11/21/2023   Ambulatory dysfunction 10/01/2023   Sacral insufficiency fracture, subacute 10/01/2023   Caregiver unable to cope 10/01/2023   AKI (acute kidney injury) (HCC) 10/01/2023   Dementia (HCC)    Parastomal hernia    Frailty 08/06/2023   Closed compression fracture of L4 vertebra (HCC) 08/06/2023   Hydronephrosis of right kidney 08/06/2023   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) 08/06/2023   CAP (community acquired pneumonia) 03/31/2022   Pressure injury of skin 09/12/2021   Acute blood loss anemia    COVID-19 virus infection    Left lower quadrant abdominal mass    Hypotension    Pre-op evaluation    Stage 3b chronic kidney disease (HCC)    Closed hip fracture requiring operative repair, left, sequela 09/07/2021   Fall at home, initial encounter 09/07/2021   Protein-calorie malnutrition, severe 07/31/2021   Nonsustained ventricular tachycardia (HCC) 07/30/2021   Hyponatremia 07/29/2021   Demand ischemia (HCC)    Hypertensive urgency 07/28/2021   Protein calorie malnutrition, unspecified (HCC) 07/28/2021   Essential hypertension 07/27/2021   Hyperlipidemia 07/27/2021   Pernicious anemia 07/27/2021   Renal failure (ARF), acute on chronic (HCC) 07/27/2021   Acute hyponatremia 07/27/2021   Elevated troponin 07/27/2021   Acute metabolic encephalopathy  07/27/2021   Chronic insomnia 04/05/2017   Chronic kidney disease (CKD), stage IV (severe) (HCC) 12/25/2015   Anemia of chronic disease 07/20/2014    Palliative Care Assessment & Plan    Assessment/Recommendations/Plan  GOC- comfort measures, agitation control- increase mirtazapine to 15mg  at bedtime, add Trazadone 25mg  at bedtime prn, continue Xanax .5mg  TID PRN, add haldol 1mg  q6hr prn for agitation, start acetaminophen 1000mg  TID scheduled, discussed with RN- requested to please utilize prn medications as ordered Per attending note- pt medically stable- TOC order has been placed for hospice referral at [facility] as she is LTC there- appreciate TOC assistance   Code Status: DNR  Prognosis:  < 6 months  Discharge Planning: Skilled Nursing Facility with Hospice  Care plan was discussed with family and care team.   Thank you for allowing the Palliative Medicine Team to assist in the care of this patient.  Total time:  60 minutes Prolonged billing:  Time includes:   Preparing to see the patient (e.g., review of tests) Obtaining and/or reviewing separately obtained history Performing a medically necessary appropriate examination and/or evaluation Counseling and educating the patient/family/caregiver Ordering medications, tests, or procedures Referring and communicating with other health care professionals (when not reported separately) Documenting clinical information in the electronic or other health record Independently interpreting results (not reported separately) and communicating results to the patient/family/caregiver Care coordination (not reported separately) Clinical documentation  Ocie Bob, AGNP-C Palliative Medicine   Please contact Palliative Medicine Team phone at [phone] for questions and concerns.

## 2023-12-02 NOTE — Plan of Care (Signed)

## 2023-12-03 DIAGNOSIS — U071 COVID-19: Secondary | ICD-10-CM | POA: Diagnosis not present

## 2023-12-03 DIAGNOSIS — J069 Acute upper respiratory infection, unspecified: Secondary | ICD-10-CM | POA: Diagnosis not present

## 2023-12-03 NOTE — Progress Notes (Addendum)
Progress Note   Patient: Autumn Johnston WUJ:[id number] DOB: 02-Feb-1922 DOA: 11/21/2023     12 DOS: the patient was seen and examined on 12/03/2023      Brief hospital course: "Autumn Johnston is a 89 y.o. female with medical history significant of dCHF, HTN, dementia per her daughter, depression with anxiety, CKD-4, colon cancer (s/p colostomy and s/p of colectomy with a chronic large para colostomy hernia), who presents with SOB and cough. Tested positive for COVID. Cxray showed left basilar opacity admitted for COVID pneumonia." See H&P for full HPI on admission & ED course.  Further hospital course and management as outlined below.  In summary, hospital course complicated and prolonged by persistent encephalopathy, persistent acute respiratory failure with hypoxia with waxing and waning oxygen requirements, and poor PO intake.  Palliative care team is following with goals of care discussions and plan for d/c back to [facility] with hospice to follow.     Assessment and Plan: Pneumonia due to COVID-19 virus  Acute respiratory failure with hypoxia (HCC):  12/13 -- O2 up to 4-5 L/min today, from 1-2 L/min past two days Treated with Rocephin and azithromycin Continue bronchodilators Continue as needed Mucinex. Now off isolation precautions. Completed prednisone taper Wean O2 as tolerated, maintain oxygen saturation above 92%.   Mild leukocytosis - resolved Urinalysis ordered by appears was not collected Chest x-ray not appear significantly different from prior No indication for antibiotics as x-ray findings likely in the setting of COVID 19 infection. Monitor CBC and fever curve  Possible aspiration event Diet has been advanced per SLP On dysphagia 2 diet (baseline diet at her facility)   Chronic diastolic CHF (congestive heart failure) (HCC):  No CHF exacerbation. Encourage oral diet, fluids. Hold diuretics for now given renal function and poor oral intake    Essential hypertension: BP's have been soft, meds have been held and BP's stable and controlled. IV hydralazine as needed Continue to hold amlodipine and Imdur & resume when appropriate   Acute on Chronic kidney disease (CKD), stage IV (severe) [facility]):  Patient has worsening renal function.  Recent baseline creatinine 1.13 on 10/04/2023.  Avoid nephrotoxic drugs.   Dementia with behavioral disturbances (HCC) Continue delirium precautions. Continue fall and aspiration precautions. Supportive care. Plan for discharge back to her facility with hospice   Depression with anxiety Continue as needed Xanax.   Large para colostomy hernia - s/p colostomy and s/p of colectomy with a chronic large para colostomy hernia. Patient's daughter wishes only conservative management.   Protein-calorie malnutrition, severe: Body weight 43.7 kg, BMI 17.4 Continue supplements, Ensure  Pressure injury of skin - POA I agree with the wound description/s as outlined. Continue wound care, frequent repositionining, close monitoring Pressure Injury 08/09/23 Sacrum Stage 2 -  Partial thickness loss of dermis presenting as a shallow open injury with a red, pink wound bed without slough. skin tear to gluteal cleft (Active)  08/09/23 0200  Location: Sacrum  Location Orientation:   Staging: Stage 2 -  Partial thickness loss of dermis presenting as a shallow open injury with a red, pink wound bed without slough.  Wound Description (Comments): skin tear to gluteal cleft  Present on Admission: No        Subjective:  Patient seen awake but with eyes closed, sitting up in bed this AM.  She is on 4-5 L oxygen today, up from 1-2 L past couple of days.  Per RN's, pt appears frequently to be hallucinating.  She is  yelling out but does not seem in distress or pain.  She does not express complaints for me.    Physical Exam:   General exam: awake with eyes closed, yelling out, frail appearing HEENT: moist mucus  membranes, hearing grossly normal  Respiratory system: CTAB, no wheezes, rales or rhonchi, normal respiratory effort. On 5 L/min [place] O2 Cardiovascular system: normal S1/S2, RRR, no pedal edema.   Gastrointestinal system: soft, NT, ND Central nervous system: A&O xself. no gross focal neurologic deficits, normal speech Extremities: moves all, no edema, normal tone Psychiatry: normal mood, congruent affect, abnormal judgment and insight      Family Communication: daughter updated at bedside this afternoon.      Disposition:  Status is: Inpatient Remains inpatient appropriate because: awaiting return to her facility. Currently requiring more oxygen than past days.    Planned Discharge Destination: return to [facility]     Time spent: 42 minutes   Data Reviewed:    Latest Ref Rng & Units 12/02/2023    4:43 AM 12/01/2023    4:07 AM 11/30/2023    3:46 AM  CBC  WBC 4.0 - 10.5 K/uL 8.3  13.1  13.1   Hemoglobin 12.0 - 15.0 g/dL 9.6  84.1  32.4   Hematocrit 36.0 - 46.0 % 30.0  32.9  35.3   Platelets 150 - 400 K/uL 271  299  349     Vitals:   12/02/23 2017 12/02/23 2237 12/03/23 0810 12/03/23 0821  BP: (!) 165/93 (!) 144/65  (!) 163/81  Pulse: 78 82  (!) 107  Resp: 16 16  20   Temp: 98.3 F (36.8 C) 98.3 F (36.8 C)  97.9 F (36.6 C)  TempSrc:  Oral  Oral  SpO2: 100% 92% (!) 85% 90%  Weight:      Height:         Author: Pennie Banter, DO 12/03/2023 11:53 AM  For on call review [email or web address].

## 2023-12-03 NOTE — Plan of Care (Signed)
  Problem: Coping: Goal: Psychosocial and spiritual needs will be supported Outcome: Progressing   Problem: Respiratory: Goal: Complications related to the disease process, condition or treatment will be avoided or minimized Outcome: Progressing   Problem: Clinical Measurements: Goal: Ability to maintain clinical measurements within normal limits will improve Outcome: Progressing   Problem: Nutrition: Goal: Adequate nutrition will be maintained Outcome: Progressing   Problem: Pain Management: Goal: General experience of comfort will improve Outcome: Progressing   Problem: Skin Integrity: Goal: Risk for impaired skin integrity will decrease Outcome: Progressing

## 2023-12-04 DIAGNOSIS — J069 Acute upper respiratory infection, unspecified: Secondary | ICD-10-CM | POA: Diagnosis not present

## 2023-12-04 DIAGNOSIS — U071 COVID-19: Secondary | ICD-10-CM | POA: Diagnosis not present

## 2023-12-04 LAB — BASIC METABOLIC PANEL
Anion gap: 9 (ref 5–15)
BUN: 60 mg/dL — ABNORMAL HIGH (ref 8–23)
CO2: 26 mmol/L (ref 22–32)
Calcium: 8.6 mg/dL — ABNORMAL LOW (ref 8.9–10.3)
Chloride: 102 mmol/L (ref 98–111)
Creatinine, Ser: 1.74 mg/dL — ABNORMAL HIGH (ref 0.44–1.00)
GFR, Estimated: 26 mL/min — ABNORMAL LOW (ref >60)
Glucose, Bld: 82 mg/dL (ref 70–99)
Potassium: 5.4 mmol/L — ABNORMAL HIGH (ref 3.5–5.1)
Sodium: 137 mmol/L (ref 135–145)

## 2023-12-04 MED ORDER — SODIUM ZIRCONIUM CYCLOSILICATE 10 G PO PACK
10.0000 g | PACK | Freq: Once | ORAL | Status: AC
  Administered 2023-12-04: 10 g via ORAL
  Filled 2023-12-04: qty 1

## 2023-12-04 NOTE — Plan of Care (Signed)
  Problem: Education: Goal: Knowledge of General Education information will improve Description: Including pain rating scale, medication(s)/side effects and non-pharmacologic comfort measures Outcome: Not Progressing   

## 2023-12-04 NOTE — Progress Notes (Addendum)
Progress Note   Patient: Autumn Johnston [id number]:[id number] DOB: 02/16/1922 DOA: 11/21/2023     13 DOS: the patient was seen and examined on 12/04/2023      Brief hospital course: "Autumn Johnston is a 89 y.o. female with medical history significant of dCHF, HTN, dementia per her daughter, depression with anxiety, CKD-4, colon cancer (s/p colostomy and s/p of colectomy with a chronic large para colostomy hernia), who presents with SOB and cough. Tested positive for COVID. Cxray showed left basilar opacity admitted for COVID pneumonia." See H&P for full HPI on admission & ED course.  Further hospital course and management as outlined below.  In summary, hospital course complicated and prolonged by persistent encephalopathy, persistent acute respiratory failure with hypoxia with waxing and waning oxygen requirements, and poor PO intake.  Palliative care team is following with goals of care discussions and plan for d/c back to [facility] with palliative/hospice to follow.     Assessment and Plan: Pneumonia due to COVID-19 virus  Acute respiratory failure with hypoxia (HCC):  12/13 -- O2 up to 4-5 L/min today, from 1-2 L/min past two days Treated with Rocephin and azithromycin Continue bronchodilators Continue as needed Mucinex. Now off isolation precautions. Completed prednisone taper Wean O2 as tolerated, maintain oxygen saturation above 92%.   Hyperkalemia - K 5.4 this AM Lokelma 10 g x 1 today Monitor BMP  Mild leukocytosis - resolved Urinalysis ordered by appears was not collected Chest x-ray not appear significantly different from prior No indication for antibiotics as x-ray findings likely in the setting of COVID 19 infection. Monitor CBC and fever curve  Possible aspiration event Diet has been advanced per SLP On dysphagia 2 diet (baseline diet at her facility)   Chronic diastolic CHF (congestive heart failure) (HCC):  No CHF exacerbation. Encourage oral diet,  fluids. Hold diuretics for now given renal function and poor oral intake   Essential hypertension: BP's have been soft, meds have been held and BP's stable and controlled. IV hydralazine as needed Continue to hold amlodipine and Imdur & resume when appropriate   Acute kidney injury Hx of CKD stage IV Pt had AKI on admission that has resolved.  Now appears recurrent, likely due to limited PO intake from dementia. Recent baseline creatinine 1.13 on 10/04/2023.  Avoid nephrotoxic drugs. Encourage PO hydration frequently   Dementia with behavioral disturbances (HCC) Continue delirium precautions. Continue fall and aspiration precautions. Supportive care. Plan for discharge back to her facility with hospice   Depression with anxiety Continue as needed Xanax.   Large para colostomy hernia - s/p colostomy and s/p of colectomy with a chronic large para colostomy hernia. Patient's daughter wishes only conservative management.   Protein-calorie malnutrition, severe: Body weight 43.7 kg, BMI 17.4 Continue supplements, Ensure  Pressure injury of skin - POA I agree with the wound description/s as outlined. Continue wound care, frequent repositionining, close monitoring Pressure Injury 08/09/23 Sacrum Stage 2 -  Partial thickness loss of dermis presenting as a shallow open injury with a red, pink wound bed without slough. skin tear to gluteal cleft (Active)  08/09/23 0200  Location: Sacrum  Location Orientation:   Staging: Stage 2 -  Partial thickness loss of dermis presenting as a shallow open injury with a red, pink wound bed without slough.  Wound Description (Comments): skin tear to gluteal cleft  Present on Admission: No        Subjective:  Patient sleeping but woke up easily to voice this AM.  Per RN she was quite anxious earlier and yelling out was given medication and since calmed down and resting.  Pt denies pain or feeling sick.  Asks "where are we going", and where have I  been.    Physical Exam:   General exam: sleeping, responds to voice and stays engaged in conversation today, frail appearing HEENT: moist mucus membranes, hearing grossly normal  Respiratory system: CTAB, no wheezes, rales or rhonchi, normal respiratory effort. On 2 L/min [place] O2 (down from 4-5 L yesterday) Cardiovascular system: normal S1/S2, RRR, no pedal edema.   Gastrointestinal system: soft, NT, ND Central nervous system: A&O xself. no gross focal neurologic deficits, normal speech Extremities: moves all, no edema, normal tone Psychiatry: normal mood, congruent affect, abnormal judgment and insight      Family Communication: daughter updated at bedside 12/13 afternoon.      Disposition:  Status is: Inpatient Remains inpatient appropriate because: awaiting return to her facility    Planned Discharge Destination: return to [facility]     Time spent: 38 minutes   Data Reviewed:    Latest Ref Rng & Units 12/02/2023    4:43 AM 12/01/2023    4:07 AM 11/30/2023    3:46 AM  CBC  WBC 4.0 - 10.5 K/uL 8.3  13.1  13.1   Hemoglobin 12.0 - 15.0 g/dL 9.6  96.2  95.2   Hematocrit 36.0 - 46.0 % 30.0  32.9  35.3   Platelets 150 - 400 K/uL 271  299  349     Vitals:   12/04/23 0355 12/04/23 0434 12/04/23 0700 12/04/23 0919  BP: 130/78   (!) 119/53  Pulse: (!) 102 82  69  Resp: 14   16  Temp: 98.5 F (36.9 C)     TempSrc: Oral     SpO2: 100%  98% 98%  Weight:      Height:         Author: Pennie Banter, DO 12/04/2023 12:21 PM  For on call review [email or web address].

## 2023-12-05 DIAGNOSIS — U071 COVID-19: Secondary | ICD-10-CM | POA: Diagnosis not present

## 2023-12-05 DIAGNOSIS — J069 Acute upper respiratory infection, unspecified: Secondary | ICD-10-CM | POA: Diagnosis not present

## 2023-12-05 LAB — BASIC METABOLIC PANEL
Anion gap: 9 (ref 5–15)
BUN: 61 mg/dL — ABNORMAL HIGH (ref 8–23)
CO2: 25 mmol/L (ref 22–32)
Calcium: 8.6 mg/dL — ABNORMAL LOW (ref 8.9–10.3)
Chloride: 100 mmol/L (ref 98–111)
Creatinine, Ser: 1.59 mg/dL — ABNORMAL HIGH (ref 0.44–1.00)
GFR, Estimated: 29 mL/min — ABNORMAL LOW (ref >60)
Glucose, Bld: 81 mg/dL (ref 70–99)
Potassium: 5.2 mmol/L — ABNORMAL HIGH (ref 3.5–5.1)
Sodium: 134 mmol/L — ABNORMAL LOW (ref 135–145)

## 2023-12-05 MED ORDER — SODIUM ZIRCONIUM CYCLOSILICATE 10 G PO PACK
10.0000 g | PACK | Freq: Once | ORAL | Status: AC
  Administered 2023-12-05: 10 g via ORAL
  Filled 2023-12-05: qty 1

## 2023-12-05 NOTE — TOC Progression Note (Signed)
Transition of Care [facility]) - Progression Note    Patient Details  Name: Autumn Johnston MRN: [id number] Date of Birth: 1922-11-14  Transition of Care [facility]) CM/SW Contact  Bing Quarry, RN Phone Number: 12/05/2023, 11:15 AM  Clinical Narrative: 12/15: RN CM spoke to [facility] at [facility] and she was STR not LTC and palliative can follow as OP, but hot hospice due to the conflicting insurance payors between hospice and Medicare. There was talk PTA, about transitioning there to LTC but the family would have to spend down some assets first. Not clear where they are, if at all, with that part of the process per [facility].  The recommendations would have to be for STR with therapy needs for Medicare to pay. Patient is documented as only oriented to self this am.   PT to revaluate today for STR potential if possible.   Gabriel Cirri MSN RN CM  Care Management Department.  [facility] Direct Dial: [phone] Main Office Phone: [phone] Weekends Only           Expected Discharge Plan and Services                                               Social Determinants of Health (SDOH) Interventions SDOH Screenings   Food Insecurity: Patient Unable To Answer (10/01/2023)  Housing: Patient Unable To Answer (10/01/2023)  Transportation Needs: Patient Unable To Answer (10/01/2023)  Utilities: Patient Unable To Answer (10/01/2023)  Tobacco Use: Low Risk  (11/21/2023)    Readmission Risk Interventions    10/04/2023    2:38 PM 03/31/2022   11:40 AM 09/17/2021    9:37 AM  Readmission Risk Prevention Plan  Transportation Screening Complete Complete Complete  PCP or Specialist Appt within 3-5 Days Complete Complete   HRI or Home Care Consult Complete Complete   Social Work Consult for Recovery Care Planning/Counseling Complete Complete   Palliative Care Screening Complete Not Applicable   Medication Review Oceanographer) Complete Complete Complete  PCP or Specialist  appointment within 3-5 days of discharge   Complete  HRI or Home Care Consult   Complete  SW Recovery Care/Counseling Consult   Not Complete  SW Consult Not Complete Comments   RNCM assigned to case  Palliative Care Screening   Not Applicable  Comments   na  Skilled Nursing Facility   Complete

## 2023-12-05 NOTE — Progress Notes (Addendum)
Progress Note   Patient: Autumn Johnston QQV:[id number] DOB: Jun 04, 1922 DOA: 11/21/2023     14 DOS: the patient was seen and examined on 12/05/2023      Brief hospital course: "Kileigh Tregoning is a 89 y.o. female with medical history significant of dCHF, HTN, dementia per her daughter, depression with anxiety, CKD-4, colon cancer (s/p colostomy and s/p of colectomy with a chronic large para colostomy hernia), who presents with SOB and cough. Tested positive for COVID. Cxray showed left basilar opacity admitted for COVID pneumonia." See H&P for full HPI on admission & ED course.  Further hospital course and management as outlined below.  In summary, hospital course complicated and prolonged by persistent encephalopathy, persistent acute respiratory failure with hypoxia with waxing and waning oxygen requirements, and poor PO intake.  Palliative care team is following with goals of care discussions and plan for d/c back to [facility] with palliative/hospice to follow.     Assessment and Plan: Pneumonia due to COVID-19 virus  Acute respiratory failure with hypoxia - resolved 12/13 -- O2 up to 4-5 L/min today, from 1-2 L/min past two days 12/14 -- on 2 L/min 12/15 -- weaned to room air Treated with Rocephin and azithromycin Continue bronchodilators Continue as needed Mucinex. Now off isolation precautions. Completed prednisone taper Wean O2 as tolerated, maintain oxygen saturation above 92%.   Hyperkalemia - K 5.4 >> 5.2 this AM Lokelma 10 g x 1 again today Monitor BMP  Mild leukocytosis - resolved Urinalysis ordered by appears was not collected Chest x-ray not appear significantly different from prior No indication for antibiotics as x-ray findings likely in the setting of COVID 19 infection. Monitor CBC and fever curve  Possible aspiration event Diet has been advanced per SLP On dysphagia 2 diet (baseline diet at her facility)   Chronic diastolic CHF (congestive heart  failure)  No CHF exacerbation. Encourage oral diet, fluids. Hold diuretics for now given renal function and poor oral intake   Essential hypertension: BP's have been soft, meds have been held and BP's stable and controlled. IV hydralazine as needed Continue to hold amlodipine and Imdur & resume when appropriate   Acute kidney injury -  Hx of CKD stage IV Pt had AKI on admission that has resolved.  Now appears recurrent, likely due to limited PO intake from dementia. Recent baseline creatinine 1.13 on 10/04/2023.  Avoid nephrotoxic drugs. Encourage PO hydration frequently   Dementia with behavioral disturbances  Continue delirium precautions. Continue fall and aspiration precautions. Supportive care. Plan for discharge back to her facility with hospice   Depression with anxiety Continue as needed Xanax.   Large para colostomy hernia - s/p colostomy and s/p of colectomy with a chronic large para colostomy hernia. Patient's daughter wishes only conservative management.   Protein-calorie malnutrition, severe: Body weight 43.7 kg, BMI 17.4 Continue supplements, Ensure  Pressure injury of skin - POA I agree with the wound description/s as outlined. Continue wound care, frequent repositionining, close monitoring Pressure Injury 08/09/23 Sacrum Stage 2 -  Partial thickness loss of dermis presenting as a shallow open injury with a red, pink wound bed without slough. skin tear to gluteal cleft (Active)  08/09/23 0200  Location: Sacrum  Location Orientation:   Staging: Stage 2 -  Partial thickness loss of dermis presenting as a shallow open injury with a red, pink wound bed without slough.  Wound Description (Comments): skin tear to gluteal cleft  Present on Admission: No  Subjective:  Patient sleeping soundly today when seen.  Wakes very briefly.  No acute complaints or acute events reported.    Physical Exam:   General exam: sleeping, briefly responds to voice and  tactile stimulation, frail appearing HEENT: moist mucus membranes, hearing grossly normal  Respiratory system: lungs clear no wheezes or rhonchi, weaned to room air Cardiovascular system: normal S1/S2, RRR, no pedal edema.   Gastrointestinal system: soft, NT, ND, LLQ colostomy in place Central nervous system: exam limited by somnolence Extremities: moves all, no edema, normal tone Psychiatry: exam limited by somnolence      Family Communication: daughter updated at bedside 12/13 afternoon.      Disposition:  Status is: Inpatient Remains inpatient appropriate because: awaiting return to her facility    Planned Discharge Destination: return to [facility]     Time spent: 36 minutes   Data Reviewed:    Latest Ref Rng & Units 12/02/2023    4:43 AM 12/01/2023    4:07 AM 11/30/2023    3:46 AM  CBC  WBC 4.0 - 10.5 K/uL 8.3  13.1  13.1   Hemoglobin 12.0 - 15.0 g/dL 9.6  16.1  09.6   Hematocrit 36.0 - 46.0 % 30.0  32.9  35.3   Platelets 150 - 400 K/uL 271  299  349     Vitals:   12/05/23 0503 12/05/23 0908 12/05/23 0930 12/05/23 1137  BP: 134/71 123/85 133/66 (!) 108/53  Pulse: 70 78 74 60  Resp: 14 15 16 14   Temp: 98.7 F (37.1 C) 97.8 F (36.6 C) (!) 97.3 F (36.3 C) 98.2 F (36.8 C)  TempSrc: Oral Axillary  Axillary  SpO2: 100% 95% 99% 96%  Weight:      Height:         Author: Pennie Banter, DO 12/05/2023 12:04 PM  For on call review [email or web address].

## 2023-12-06 DIAGNOSIS — U071 COVID-19: Secondary | ICD-10-CM | POA: Diagnosis not present

## 2023-12-06 DIAGNOSIS — J069 Acute upper respiratory infection, unspecified: Secondary | ICD-10-CM | POA: Diagnosis not present

## 2023-12-06 LAB — BASIC METABOLIC PANEL
Anion gap: 10 (ref 5–15)
BUN: 57 mg/dL — ABNORMAL HIGH (ref 8–23)
CO2: 25 mmol/L (ref 22–32)
Calcium: 8.9 mg/dL (ref 8.9–10.3)
Chloride: 102 mmol/L (ref 98–111)
Creatinine, Ser: 1.62 mg/dL — ABNORMAL HIGH (ref 0.44–1.00)
GFR, Estimated: 28 mL/min — ABNORMAL LOW (ref >60)
Glucose, Bld: 85 mg/dL (ref 70–99)
Potassium: 4.8 mmol/L (ref 3.5–5.1)
Sodium: 137 mmol/L (ref 135–145)

## 2023-12-06 NOTE — Progress Notes (Signed)
Progress Note   Patient: Bushra Mock MVH:[id number] DOB: 10-20-1922 DOA: 11/21/2023     15 DOS: the patient was seen and examined on 12/06/2023      Brief hospital course: "Shantel Remaly is a 89 y.o. female with medical history significant of dCHF, HTN, dementia per her daughter, depression with anxiety, CKD-4, colon cancer (s/p colostomy and s/p of colectomy with a chronic large para colostomy hernia), who presents with SOB and cough. Tested positive for COVID. Cxray showed left basilar opacity admitted for COVID pneumonia." See H&P for full HPI on admission & ED course.  Further hospital course and management as outlined below.  In summary, hospital course complicated and prolonged by persistent encephalopathy, persistent acute respiratory failure with hypoxia with waxing and waning oxygen requirements, and poor PO intake.  Palliative care team is following with goals of care discussions and plan for d/c back to [facility] with palliative/hospice to follow.     Assessment and Plan: Pneumonia due to COVID-19 virus  Acute respiratory failure with hypoxia - resolved 12/13 -- O2 up to 4-5 L/min today, from 1-2 L/min past two days 12/14 -- on 2 L/min 12/15 -- weaned to room air Treated with Rocephin and azithromycin Continue bronchodilators Continue as needed Mucinex. Now off isolation precautions. Completed prednisone taper Wean O2 as tolerated, maintain oxygen saturation above 92%.   Hyperkalemia - K 5.4 >> 5.2  Lokelma 10 g x 1 again today Monitor BMP  Mild leukocytosis - resolved Urinalysis ordered by appears was not collected Chest x-ray not appear significantly different from prior No indication for antibiotics as x-ray findings likely in the setting of COVID 19 infection. Monitor CBC and fever curve  Possible aspiration event Diet has been advanced per SLP On dysphagia 2 diet (baseline diet at her facility)   Chronic diastolic CHF (congestive heart  failure)  No CHF exacerbation. Encourage oral diet, fluids. Hold diuretics for now given renal function and poor oral intake   Essential hypertension: BP's have been soft, meds have been held and BP's stable and controlled. IV hydralazine as needed Continue to hold amlodipine and Imdur & resume when appropriate   Acute kidney injury -  Hx of CKD stage IV Pt had AKI on admission that has resolved.  Now appears recurrent, likely due to limited PO intake from dementia. Recent baseline creatinine 1.13 on 10/04/2023.  Avoid nephrotoxic drugs. Encourage PO hydration frequently   Dementia with behavioral disturbances  Continue delirium precautions. Continue fall and aspiration precautions. Supportive care. Plan for discharge back to her facility with hospice   Depression with anxiety Continue as needed Xanax.   Large para colostomy hernia - s/p colostomy and s/p of colectomy with a chronic large para colostomy hernia. Patient's daughter wishes only conservative management.   Protein-calorie malnutrition, severe: Body weight 43.7 kg, BMI 17.4 Continue supplements, Ensure  Pressure injury of skin - POA I agree with the wound description/s as outlined. Continue wound care, frequent repositionining, close monitoring Pressure Injury 08/09/23 Sacrum Stage 2 -  Partial thickness loss of dermis presenting as a shallow open injury with a red, pink wound bed without slough. skin tear to gluteal cleft (Active)  08/09/23 0200  Location: Sacrum  Location Orientation:   Staging: Stage 2 -  Partial thickness loss of dermis presenting as a shallow open injury with a red, pink wound bed without slough.  Wound Description (Comments): skin tear to gluteal cleft  Present on Admission: No  Subjective: No significant events overnight, patient was resting comfortably.  Patient has significant dementia, AAO x 0-1     Physical Exam:   General exam: NAD, resting comfortably, frail  appearing HEENT: moist mucus membranes, hearing grossly normal  Respiratory system: lungs clear no wheezes or rhonchi, weaned to room air Cardiovascular system: normal S1/S2, RRR, no pedal edema.   Gastrointestinal system: soft, NT, ND, LLQ colostomy in place Central nervous system: exam limited by somnolence Extremities: moves all, no edema, normal tone Psychiatry: exam limited due to significant dementia      Family Communication: daughter updated at bedside 12/13 afternoon.      Disposition:  Status is: Inpatient Remains inpatient appropriate because: awaiting return to her facility    Planned Discharge Destination: return to [facility]     Time spent: 35 minutes   Data Reviewed:    Latest Ref Rng & Units 12/02/2023    4:43 AM 12/01/2023    4:07 AM 11/30/2023    3:46 AM  CBC  WBC 4.0 - 10.5 K/uL 8.3  13.1  13.1   Hemoglobin 12.0 - 15.0 g/dL 9.6  16.1  09.6   Hematocrit 36.0 - 46.0 % 30.0  32.9  35.3   Platelets 150 - 400 K/uL 271  299  349     Vitals:   12/06/23 0417 12/06/23 0439 12/06/23 0732 12/06/23 1125  BP: (!) 140/74  134/62 (!) 82/46  Pulse: 82  76 83  Resp: 20  16 18   Temp: 97.7 F (36.5 C)  98.7 F (37.1 C) 98.1 F (36.7 C)  TempSrc:   Oral Oral  SpO2: 92%  96% 97%  Weight:  40.5 kg    Height:         Author: Gillis Santa, MD 12/06/2023 3:29 PM  For on call review [email or web address].

## 2023-12-06 NOTE — Progress Notes (Signed)
Physical Therapy Treatment Patient Details Name: Autumn Johnston MRN: [id number] DOB: [birth year] Today's Date: 12/06/2023   History of Present Illness Autumn Johnston is a 89 y.o. female with medical history significant of dCHF, HTN, dementia per her daughter, depression with anxiety, CKD-4, colon cancer (s/p colostomy and s/p of colectomy with a chronic large para colostomy hernia), who presents with SOB and cough. Patient is not using oxygen normally, but came in with 4L of oxygen with 97%, which desaturated to 80% when trying to decrease to 2 L oxygen in ED.  MD assessment includes PNA secondary Covid-19, acute respiratory failure, and AKI.    PT Comments  Pt initially very motivated to get out of bed and put forth good effort with bed mobility tasks with only minor cuing for general sequencing.  Once sitting at the EOB unsupported the pt was able to maintain static sitting balance for most of her time in sitting with occasional assist to prevent posterior LOB.  Pt initially stated she wanted to try to stand but once in sitting declined despite encouragement.  Pt will benefit from continued PT services upon discharge to safely address deficits listed in patient problem list for decreased caregiver assistance and eventual return to PLOF.     If plan is discharge home, recommend the following: Two people to help with walking and/or transfers;A lot of help with bathing/dressing/bathroom   Can travel by private vehicle        Equipment Recommendations  Other (comment) (TBD at next venue of care)    Recommendations for Other Services       Precautions / Restrictions Precautions Precautions: Fall Precaution Comments: watch OT sats Restrictions Weight Bearing Restrictions Per Provider Order: No Other Position/Activity Restrictions: Colostomy     Mobility  Bed Mobility Overal bed mobility: Needs Assistance Bed Mobility: Supine to Sit, Sit to Supine     Supine to sit: Mod  assist Sit to supine: Max assist, +2 for physical assistance   General bed mobility comments: Pt put forth good effort during bed mobiltiy training with minimal cues needed for sequencing    Transfers                   General transfer comment: Pt declined to attempt    Ambulation/Gait                   Stairs             Wheelchair Mobility     Tilt Bed    Modified Rankin (Stroke Patients Only)       Balance Overall balance assessment: Needs assistance   Sitting balance-Leahy Scale: Fair                                      Cognition Arousal: Alert Behavior During Therapy: Anxious Overall Cognitive Status: No family/caregiver present to determine baseline cognitive functioning                                          Exercises Other Exercises Other Exercises: rolling left/right with cues for sequencing and use of rails Other Exercises: static unsupported sitting at the EOB x 5 min for core strengthening and improved activity tolerance    General Comments        Pertinent  Vitals/Pain Pain Assessment Breathing: normal Negative Vocalization: none Facial Expression: smiling or inexpressive Body Language: relaxed Consolability: no need to console PAINAD Score: 0 Pain Intervention(s): Monitored during session    Home Living                          Prior Function            PT Goals (current goals can now be found in the care plan section) Progress towards PT goals: Progressing toward goals    Frequency    Min 1X/week      PT Plan      Co-evaluation              AM-PAC PT "6 Clicks" Mobility   Outcome Measure  Help needed turning from your back to your side while in a flat bed without using bedrails?: A Lot Help needed moving from lying on your back to sitting on the side of a flat bed without using bedrails?: A Lot Help needed moving to and from a bed to a chair  (including a wheelchair)?: Total Help needed standing up from a chair using your arms (e.g., wheelchair or bedside chair)?: Total Help needed to walk in hospital room?: Total Help needed climbing 3-5 steps with a railing? : Total 6 Click Score: 8    End of Session   Activity Tolerance: Patient tolerated treatment well Patient left: in bed;with call bell/phone within reach;with bed alarm set;with nursing/sitter in room Nurse Communication: Mobility status PT Visit Diagnosis: Unsteadiness on feet (R26.81);Other abnormalities of gait and mobility (R26.89);Muscle weakness (generalized) (M62.81);Difficulty in walking, not elsewhere classified (R26.2)     Time: 1308-6578 PT Time Calculation (min) (ACUTE ONLY): 14 min  Charges:    $Therapeutic Activity: 8-22 mins PT General Charges $$ ACUTE PT VISIT: 1 Visit                    D. Scott Shea Kapur PT, DPT 12/06/23, 10:57 AM

## 2023-12-06 NOTE — Progress Notes (Signed)
Nutrition Follow-up  DOCUMENTATION CODES:   Underweight, Severe malnutrition in context of chronic illness  INTERVENTION:   -Continue MVI with minerals daily -Continue Ensure Enlive po to TID, each supplement provides 350 kcal and 20 grams of protein -Continue Magic cup TID with meals, each supplement provides 290 kcal and 9 grams of protein  -Continue 500 mg vitamin C BID -Continue 220 mg zinc sulfate daily x 14 days -Continue feeding assistance with meals -RD will sign off due to set goals of care; there is nothing further to add from a nutritional standpoint  NUTRITION DIAGNOSIS:   Severe Malnutrition related to chronic illness (dementia) as evidenced by severe fat depletion, severe muscle depletion.  Ongoing  GOAL:   Patient will meet greater than or equal to 90% of their needs  Progressing  MONITOR:   PO intake, Supplement acceptance  REASON FOR ASSESSMENT:   Consult Assessment of nutrition requirement/status  ASSESSMENT:   Pt with medical history significant of dCHF, HTN, dementia per her daughter, depression with anxiety, CKD-4, colon cancer (s/p colostomy and s/p of colectomy with a chronic large para colostomy hernia), who presents with shortness of breath and cough.  12/6- s/p BSE- dysphagia 1 diet with thin liquids  12/11- downgraded to dysphagia 2 diet per SLP due to PTA diet at facility  Reviewed I/O's: +120 ml x 24 hours and -1.7 L since 11/22/23   Pt lying in bed at time of visit. She was moaning in nonsensical language and unable to participate in conversation. No family at bedside.   Pt on a dysphagia 2 diet. Noted meal intake continues to be poor; 0-40%.   Case discussed with RN, MD, SLP, and palliative care. Pt family desires comfort focused care. Plan to return to SNF with hospice benefit vs rehab.   Goals of care are clear and there is nothing further that RD can offer to pt's care from a nutritional standpoint.   Medications reviewed and  include vitamin C and remeron.   Labs reviewed.   Diet Order:   Diet Order             DIET DYS 2 Room service appropriate? Yes; Fluid consistency: Thin  Diet effective now                   EDUCATION NEEDS:   No education needs have been identified at this time  Skin:  Skin Assessment: Skin Integrity Issues: Skin Integrity Issues:: Stage II Stage II: sacrum  Last BM:  11/28/23 (via colostomy)  Height:   Ht Readings from Last 1 Encounters:  11/26/23 5\' 2"  (1.575 m)    Weight:   Wt Readings from Last 1 Encounters:  12/06/23 40.5 kg    Ideal Body Weight:  50 kg  BMI:  Body mass index is 16.33 kg/m.  Estimated Nutritional Needs:   Kcal:  1500-1700  Protein:  75-90 grams  Fluid:  > 1.5 L    Levada Schilling, RD, LDN, CDCES Registered Dietitian III Certified Diabetes Care and Education Specialist If unable to reach this RD, please use "RD Inpatient" group chat on secure chat between hours of 8am-4 pm daily

## 2023-12-06 NOTE — TOC Progression Note (Signed)
Transition of Care [facility]) - Progression Note    Patient Details  Name: Tilisa Detorres MRN: [id number] Date of Birth: [birth year]  Transition of Care [facility]) CM/SW Contact  Truddie Hidden, RN Phone Number: 12/06/2023, 3:26 PM  Clinical Narrative:    Sherron Monday with Tinnie Gens, patient's daughter regarding discharge back to [facility]. RNCM explained patient was previously STR. She was advised patient would have to return to facility for STR. She was advised the facility could apply for LTSS for patient to convert to LTC. Tinnie Gens was advised once patient converts to LTC hospice care while at a facility would be covered by Medicare. Tinnie Gens stated her understanding. She was advised of therapy work with patient today and she is still appropriate for STR.         Expected Discharge Plan and Services                                               Social Determinants of Health (SDOH) Interventions SDOH Screenings   Food Insecurity: Patient Unable To Answer (10/01/2023)  Housing: Patient Unable To Answer (10/01/2023)  Transportation Needs: Patient Unable To Answer (10/01/2023)  Utilities: Patient Unable To Answer (10/01/2023)  Tobacco Use: Low Risk  (11/21/2023)    Readmission Risk Interventions    10/04/2023    2:38 PM 03/31/2022   11:40 AM 09/17/2021    9:37 AM  Readmission Risk Prevention Plan  Transportation Screening Complete Complete Complete  PCP or Specialist Appt within 3-5 Days Complete Complete   HRI or Home Care Consult Complete Complete   Social Work Consult for Recovery Care Planning/Counseling Complete Complete   Palliative Care Screening Complete Not Applicable   Medication Review Oceanographer) Complete Complete Complete  PCP or Specialist appointment within 3-5 days of discharge   Complete  HRI or Home Care Consult   Complete  SW Recovery Care/Counseling Consult   Not Complete  SW Consult Not Complete Comments   RNCM assigned to case  Palliative Care Screening    Not Applicable  Comments   na  Skilled Nursing Facility   Complete

## 2023-12-07 DIAGNOSIS — U071 COVID-19: Secondary | ICD-10-CM | POA: Diagnosis not present

## 2023-12-07 DIAGNOSIS — J069 Acute upper respiratory infection, unspecified: Secondary | ICD-10-CM | POA: Diagnosis not present

## 2023-12-07 MED ORDER — QUETIAPINE FUMARATE 25 MG PO TABS
12.5000 mg | ORAL_TABLET | Freq: Two times a day (BID) | ORAL | Status: DC
  Administered 2023-12-07 – 2023-12-08 (×2): 12.5 mg via ORAL
  Filled 2023-12-07 (×2): qty 1

## 2023-12-07 MED ORDER — SODIUM CHLORIDE 0.9 % IV SOLN: INTRAVENOUS | Status: DC

## 2023-12-07 NOTE — Progress Notes (Signed)
Progress Note   Patient: Autumn Johnston ZOX:[id number] DOB: [birth year] DOA: 11/21/2023     16 DOS: the patient was seen and examined on 12/07/2023      Brief hospital course: "Autumn Johnston is a 89 y.o. female with medical history significant of dCHF, HTN, dementia per her daughter, depression with anxiety, CKD-4, colon cancer (s/p colostomy and s/p of colectomy with a chronic large para colostomy hernia), who presents with SOB and cough. Tested positive for COVID. Cxray showed left basilar opacity admitted for COVID pneumonia." See H&P for full HPI on admission & ED course.  Further hospital course and management as outlined below.  In summary, hospital course complicated and prolonged by persistent encephalopathy, persistent acute respiratory failure with hypoxia with waxing and waning oxygen requirements, and poor PO intake.  Palliative care team is following with goals of care discussions and plan for d/c back to [facility] with palliative/hospice to follow.     Assessment and Plan: Pneumonia due to COVID-19 virus  Acute respiratory failure with hypoxia - resolved 12/13 -- O2 up to 4-5 L/min today, from 1-2 L/min past two days 12/14 -- on 2 L/min 12/15 -- weaned to room air Treated with Rocephin and azithromycin Continue bronchodilators Continue as needed Mucinex. Now off isolation precautions. Completed prednisone taper Wean O2 as tolerated, maintain oxygen saturation above 92%.   Hyperkalemia - K 5.4 >> 5.2 >>4.8 Lokelma 10 g x 1 dose on 12/15 Monitor BMP  Mild leukocytosis - resolved Urinalysis ordered by appears was not collected Chest x-ray not appear significantly different from prior No indication for antibiotics as x-ray findings likely in the setting of COVID 19 infection. Monitor CBC and fever curve  Possible aspiration event Diet has been advanced per SLP On dysphagia 2 diet (baseline diet at her facility)   Chronic diastolic CHF (congestive heart  failure)  No CHF exacerbation. Encourage oral diet, fluids. Hold diuretics for now given renal function and poor oral intake   Essential hypertension: BP's have been soft, meds have been held and BP's stable and controlled. IV hydralazine as needed Continue to hold amlodipine and Imdur & resume when appropriate   Acute kidney injury -  Hx of CKD stage IV Pt had AKI on admission that has resolved.  Now appears recurrent, likely due to limited PO intake from dementia. Recent baseline creatinine 1.13 on 10/04/2023.  Avoid nephrotoxic drugs. Encourage PO hydration frequently 12/17 IV fluid for overnight hydration  Dementia with behavioral disturbances  Continue delirium precautions. Continue fall and aspiration precautions. Supportive care. Plan for discharge back to her facility with hospice 12/17 started Seroquel 12.5 mg p.o. twice daily for delirium   Depression with anxiety Continue as needed Xanax.   Large para colostomy hernia - s/p colostomy and s/p of colectomy with a chronic large para colostomy hernia. Patient's daughter wishes only conservative management.   Protein-calorie malnutrition, severe: Body weight 43.7 kg, BMI 17.4 Continue supplements, Ensure  Pressure injury of skin - POA I agree with the wound description/s as outlined. Continue wound care, frequent repositionining, close monitoring Pressure Injury 08/09/23 Sacrum Stage 2 -  Partial thickness loss of dermis presenting as a shallow open injury with a red, pink wound bed without slough. skin tear to gluteal cleft (Active)  08/09/23 0200  Location: Sacrum  Location Orientation:   Staging: Stage 2 -  Partial thickness loss of dermis presenting as a shallow open injury with a red, pink wound bed without slough.  Wound Description (Comments):  skin tear to gluteal cleft  Present on Admission: No   Body mass index is 16.33 kg/m.  heparin injection 5,000 Units Start: 11/21/23 2200     Subjective: No  significant events overnight, patient was resting comfortably.  Patient has significant dementia, AAO x 0-1     Physical Exam:   General exam: NAD, resting comfortably, frail appearing HEENT: moist mucus membranes, hearing grossly normal  Respiratory system: lungs clear no wheezes or rhonchi, weaned to room air Cardiovascular system: normal S1/S2, RRR, no pedal edema.   Gastrointestinal system: soft, NT, ND, LLQ colostomy in place Central nervous system: exam limited by somnolence Extremities: moves all, no edema, normal tone Psychiatry: exam limited due to significant dementia      Family Communication: daughter updated at bedside 12/13 afternoon.      Disposition:  Status is: Inpatient Remains inpatient appropriate because: awaiting return to her facility    Planned Discharge Destination: return to [facility]     Time spent: 35 minutes   Data Reviewed:    Latest Ref Rng & Units 12/02/2023    4:43 AM 12/01/2023    4:07 AM 11/30/2023    3:46 AM  CBC  WBC 4.0 - 10.5 K/uL 8.3  13.1  13.1   Hemoglobin 12.0 - 15.0 g/dL 9.6  16.1  09.6   Hematocrit 36.0 - 46.0 % 30.0  32.9  35.3   Platelets 150 - 400 K/uL 271  299  349     Vitals:   12/06/23 2315 12/07/23 0607 12/07/23 1017 12/07/23 1242  BP: (!) 105/53 126/62 (!) 109/58 (!) 102/49  Pulse: 64 77 72 69  Resp: 18 18 16 16   Temp: (!) 97.3 F (36.3 C) 97.9 F (36.6 C) (!) 97.5 F (36.4 C) (!) 97.5 F (36.4 C)  TempSrc: Oral Oral    SpO2: 97% 95% 96% 96%  Weight:      Height:         Author: Gillis Santa, MD 12/07/2023 4:40 PM  For on call review [email or web address].

## 2023-12-07 NOTE — Plan of Care (Signed)

## 2023-12-08 DIAGNOSIS — J069 Acute upper respiratory infection, unspecified: Secondary | ICD-10-CM | POA: Diagnosis not present

## 2023-12-08 DIAGNOSIS — U071 COVID-19: Secondary | ICD-10-CM | POA: Diagnosis not present

## 2023-12-08 MED ORDER — QUETIAPINE FUMARATE 25 MG PO TABS: 12.5000 mg | ORAL_TABLET | Freq: Two times a day (BID) | ORAL | Status: DC

## 2023-12-08 MED ORDER — BISACODYL 5 MG PO TBEC
10.0000 mg | DELAYED_RELEASE_TABLET | Freq: Once | ORAL | Status: AC
  Administered 2023-12-08: 10 mg via ORAL
  Filled 2023-12-08: qty 2

## 2023-12-08 MED ORDER — ALPRAZOLAM 0.25 MG PO TABS: 0.2500 mg | ORAL_TABLET | Freq: Two times a day (BID) | ORAL | 0 refills | Status: DC | PRN

## 2023-12-08 MED ORDER — BISACODYL 10 MG RE SUPP: 10.0000 mg | Freq: Every day | RECTAL | Status: DC | PRN

## 2023-12-08 MED ORDER — HYDRALAZINE HCL 50 MG PO TABS: 50.0000 mg | ORAL_TABLET | Freq: Three times a day (TID) | ORAL | Status: AC | PRN

## 2023-12-08 MED ORDER — HYDROCODONE-ACETAMINOPHEN 5-325 MG PO TABS: 1.0000 | ORAL_TABLET | ORAL | 0 refills | Status: DC | PRN

## 2023-12-08 MED ORDER — TRAZODONE HCL 50 MG PO TABS: 25.0000 mg | ORAL_TABLET | Freq: Every evening | ORAL | Status: DC | PRN

## 2023-12-08 MED ORDER — BISACODYL 5 MG PO TBEC: 10.0000 mg | DELAYED_RELEASE_TABLET | Freq: Every day | ORAL | Status: DC

## 2023-12-08 MED ORDER — POLYETHYLENE GLYCOL 3350 17 G PO PACK
17.0000 g | PACK | Freq: Two times a day (BID) | ORAL | Status: DC
  Administered 2023-12-08: 17 g via ORAL
  Filled 2023-12-08: qty 1

## 2023-12-08 MED ORDER — POLYETHYLENE GLYCOL 3350 17 G PO PACK: 17.0000 g | PACK | Freq: Every day | ORAL | Status: DC

## 2023-12-08 NOTE — Progress Notes (Signed)
Spoke with Verner Mould, Lpn at [facility] for report. Awaiting EMS transport to facility.

## 2023-12-08 NOTE — Discharge Summary (Signed)
[facility] Discharge Summary   Patient: Autumn Johnston UYQ:[id number]  PCP: Marguarite Arbour, MD  Date of admission: 11/21/2023   Date of discharge:  12/08/2023     Discharge Diagnoses:  Principal Problem:   Acute respiratory disease due to COVID-19 virus Active Problems:   Acute respiratory failure with hypoxia (HCC)   Chronic diastolic CHF (congestive heart failure) (HCC)   Essential hypertension   Chronic kidney disease (CKD), stage IV (severe) (HCC)   Dementia (HCC)   Depression with anxiety   Protein-calorie malnutrition, severe   Admitted From: SNF Disposition:  SNF   Recommendations for Outpatient Follow-up:  PCP: Follow-up MD at the skilled nursing facility in 1 to 2 days, continue to monitor BP and titrate medication accordingly.  Blood pressure was soft during hospital stay so discontinued antihypertensive medications for now.  Resume as needed, started hydralazine as needed if systolic BP greater than 150 mmHg. Follow up LABS/TEST:     Diet recommendation: Dysphagia type 2 Thin Liquid  Activity: The patient is advised to gradually reintroduce usual activities, as tolerated  Discharge Condition: stable  Code Status: DNR -Limited  History of present illness: As per the H and P dictated on admission Hospital Course:  "Charlianne Melick is a 89 y.o. female with medical history significant of dCHF, HTN, dementia per her daughter, depression with anxiety, CKD-4, colon cancer (s/p colostomy and s/p of colectomy with a chronic large para colostomy hernia), who presents with SOB and cough. Tested positive for COVID. Cxray showed left basilar opacity admitted for COVID pneumonia." See H&P for full HPI on admission & ED course.   Further hospital course and management as outlined below.   In summary, hospital course complicated and prolonged by persistent encephalopathy, persistent acute respiratory failure with hypoxia with waxing and waning oxygen  requirements, and poor PO intake.  Palliative care team is following with goals of care discussions and plan for d/c back to [facility] with palliative/hospice to follow.    Assessment and Plan: # Pneumonia due to COVID-19 virus  # Acute respiratory failure with hypoxia - resolved Currently patient is saturating well on room air since 12/15 Completed Rocephin and azithromycin.  Patient was given bronchodilators and symptomatic treatment for cough.  Currently stable to discharge. # Hyperkalemia, resolvedm s/p Lokelma 10 g x 1 dose on 12/15 # Mild leukocytosis - resolved, patient was treated as above. # Possible aspiration event: Diet has been advanced per SLP. On dysphagia 2 diet (baseline diet at her facility) # Chronic diastolic CHF (congestive heart failure)\: No CHF exacerbation. Encourage oral diet, fluids.  # Essential hypertension: BP's have been soft, meds have been held and BP's stable and controlled.  Discontinued amlodipine and Imdur on discharge.  Started hydralazine 50 mg p.o. 3 times daily as needed for systolic BP greater than 150 mmHg.  Continue to monitor BP and titrate medications accordingly. # Acute kidney injury - Hx of CKD stage IV Pt had AKI on admission that has resolved.  Now appears recurrent, likely due to limited PO intake from dementia. Recent baseline creatinine 1.13 on 10/04/2023. Avoid nephrotoxic drugs. Encourage PO hydration frequently. On 12/17 IV fluid for overnight hydration.  # Dementia with behavioral disturbances: Continue delirium precautions. Continue fall and aspiration precautions. Supportive care. Plan for discharge back to her facility with hospice care down the road if condition deteriorates.  Please consult palliative care. 12/17 started Seroquel 12.5 mg p.o. twice daily for delirium # Depression with anxiety: Continue as  needed Xanax. # Large para colostomy hernia - s/p colostomy and s/p of colectomy with a chronic large para colostomy hernia.  Patient's daughter wishes only conservative management. # Protein-calorie malnutrition, severe: Body weight 43.7 kg, BMI 17.4 Continue supplements, Ensure  Body mass index is 13.71 kg/m.  Nutrition Problem: Severe Malnutrition Etiology: chronic illness (dementia) Nutrition Interventions: Interventions: Ensure Enlive (each supplement provides 350kcal and 20 grams of protein), Magic cup, MVI, Liberalize Diet  Pressure Injury 08/09/23 Sacrum Stage 2 -  Partial thickness loss of dermis presenting as a shallow open injury with a red, pink wound bed without slough. skin tear to gluteal cleft (Active)  08/09/23 0200  Location: Sacrum  Location Orientation:   Staging: Stage 2 -  Partial thickness loss of dermis presenting as a shallow open injury with a red, pink wound bed without slough.  Wound Description (Comments): skin tear to gluteal cleft  Present on Admission: No  Dressing Type Foam - Lift dressing to assess site every shift 12/07/23 1955     Patient was seen by physical therapy, who recommended Therapy, SNF placement, which was arranged. On the day of the discharge the patient's vitals were stable, and no other acute medical condition were reported by patient. the patient was felt safe to be discharge at [facility].  Consultants: None Procedures: None  Discharge Exam: General: Appear in no distress, no Rash; Oral Mucosa Clear, moist. Cardiovascular: S1 and S2 Present, no Murmur, Respiratory: normal respiratory effort, Bilateral Air entry present and no Crackles, no wheezes Abdomen: Bowel Sound present, Soft and no tenderness, no hernia Extremities: no Pedal edema, no calf tenderness Neurology: [place] grossly intact, no focal deficits. affect appropriate.  Filed Weights   12/02/23 0348 12/06/23 0439 12/08/23 0500  Weight: 38.4 kg 40.5 kg 34 kg   Vitals:   12/08/23 0731 12/08/23 1141  BP: 136/61 (!) 105/50  Pulse: 65 65  Resp:    Temp: (!) 96.1 F (35.6 C) 98.6 F (37 C)  SpO2:  98% 96%    DISCHARGE MEDICATION: Allergies as of 12/08/2023   No Known Allergies      Medication List     STOP taking these medications    amLODipine 10 MG tablet Commonly known as: NORVASC   guaiFENesin 600 MG 12 hr tablet Commonly known as: MUCINEX   isosorbide mononitrate 30 MG 24 hr tablet Commonly known as: IMDUR       TAKE these medications    ALPRAZolam 0.25 MG tablet Commonly known as: XANAX Take 1 tablet (0.25 mg total) by mouth 2 (two) times daily as needed.   bisacodyl 5 MG EC tablet Commonly known as: DULCOLAX Take 2 tablets (10 mg total) by mouth at bedtime. Start taking on: December 09, 2023   hydrALAZINE 50 MG tablet Commonly known as: APRESOLINE Take 1 tablet (50 mg total) by mouth 3 (three) times daily as needed (If systolic BP greater than 150 mmHg.). If systolic BP greater than 150 mmHg.   HYDROcodone-acetaminophen 5-325 MG tablet Commonly known as: NORCO/VICODIN Take 1-2 tablets by mouth every 4 (four) hours as needed for moderate pain (pain score 4-6).   mirtazapine 7.5 MG tablet Commonly known as: REMERON Take 7.5 mg by mouth at bedtime.   ondansetron 4 MG disintegrating tablet Commonly known as: ZOFRAN-ODT Take 1 tablet (4 mg total) by mouth every 8 (eight) hours as needed for nausea or vomiting.   polyethylene glycol 17 g packet Commonly known as: MIRALAX / GLYCOLAX Take 17 g by mouth daily.  Skip the dose if no constipation What changed:  when to take this reasons to take this additional instructions   QUEtiapine 25 MG tablet Commonly known as: SEROQUEL Take 0.5 tablets (12.5 mg total) by mouth 2 (two) times daily.   solifenacin 10 MG tablet Commonly known as: VESICARE Take 10 mg by mouth daily.   traZODone 50 MG tablet Commonly known as: DESYREL Take 0.5 tablets (25 mg total) by mouth at bedtime as needed for sleep.               Discharge Care Instructions  (From admission, onward)           Start      Ordered   12/08/23 0000  Discharge wound care:       Comments: As above   12/08/23 1443           No Known Allergies Discharge Instructions     Call MD for:  difficulty breathing, headache or visual disturbances   Complete by: As directed    Call MD for:  extreme fatigue   Complete by: As directed    Call MD for:  persistant dizziness or light-headedness   Complete by: As directed    Call MD for:  persistant nausea and vomiting   Complete by: As directed    Call MD for:  severe uncontrolled pain   Complete by: As directed    Call MD for:  temperature >100.4   Complete by: As directed    Diet - low sodium heart healthy   Complete by: As directed    Discharge instructions   Complete by: As directed    Follow-up MD at the skilled nursing facility in 1 to 2 days, continue to monitor BP and titrate medication accordingly.  Blood pressure was soft during hospital stay so discontinued antihypertensive medications for now.  Resume as needed, started hydralazine as needed if systolic BP greater than 150 mmHg.   Discharge wound care:   Complete by: As directed    As above   Increase activity slowly   Complete by: As directed        The results of significant diagnostics from this hospitalization (including imaging, microbiology, ancillary and laboratory) are listed below for reference.    Significant Diagnostic Studies: DG Chest Port 1 View Result Date: 11/30/2023 CLINICAL DATA:  Shortness of breath.  COVID infection. EXAM: PORTABLE CHEST 1 VIEW COMPARISON:  Chest x-ray dated November 21, 2023. FINDINGS: The patient is rotated to the left, limiting evaluation. Stable cardiomediastinal silhouette. Mildly increased opacity in the right mid and lower lung. Continued left basilar opacity. No pneumothorax or large pleural effusion. No acute osseous abnormality. IMPRESSION: 1. Significantly limited study due to patient rotation. Mildly increased opacity in the right mid and lower lung,  concerning for pneumonia given clinical history. 2. Unchanged left basilar infiltrate versus atelectasis. Electronically Signed   By: Obie Dredge M.D.   On: 11/30/2023 14:39   DG Abd 1 View Result Date: 11/23/2023 CLINICAL DATA:  Abdominal wall hernia EXAM: ABDOMEN - 1 VIEW COMPARISON:  CT 09/17/2023 FINDINGS: Large left lower quadrant hernia containing multiple loops of air-filled bowel corresponding to ostomy and parastomal hernia. No convincing obstructive pattern. Numerous clips in the pelvis. Hardware in the left femur IMPRESSION: Large left lower quadrant hernia containing multiple loops of air-filled bowel corresponding to ostomy and larger parastomal hernia. Electronically Signed   By: Jasmine Pang M.D.   On: 11/23/2023 19:23   DG Chest [facility]  1 View Result Date: 11/21/2023 CLINICAL DATA:  Cough. EXAM: PORTABLE CHEST 1 VIEW COMPARISON:  August 06, 2023. FINDINGS: Stable cardiomediastinal silhouette. Increased left basilar opacity is noted suggesting worsening atelectasis or infiltrate with associated effusion. Minimal right basilar subsegmental atelectasis is noted. Bony thorax is unremarkable. IMPRESSION: Increased left basilar opacity is noted suggesting worsening atelectasis or infiltrate with associated effusion. Electronically Signed   By: Lupita Raider M.D.   On: 11/21/2023 17:46    Microbiology: No results found for this or any previous visit (from the past 240 hours).   Labs: CBC: Recent Labs  Lab 12/02/23 0443  WBC 8.3  HGB 9.6*  HCT 30.0*  MCV 88.5  PLT 271   Basic Metabolic Panel: Recent Labs  Lab 12/04/23 0435 12/05/23 0459 12/06/23 0616  NA 137 134* 137  K 5.4* 5.2* 4.8  CL 102 100 102  CO2 26 25 25   GLUCOSE 82 81 85  BUN 60* 61* 57*  CREATININE 1.74* 1.59* 1.62*  CALCIUM 8.6* 8.6* 8.9   Liver Function Tests: No results for input(s): "AST", "ALT", "ALKPHOS", "BILITOT", "PROT", "ALBUMIN" in the last 168 hours. No results for input(s): "LIPASE",  "AMYLASE" in the last 168 hours. No results for input(s): "AMMONIA" in the last 168 hours. Cardiac Enzymes: No results for input(s): "CKTOTAL", "CKMB", "CKMBINDEX", "TROPONINI" in the last 168 hours. BNP (last 3 results) Recent Labs    11/21/23 1912  BNP 317.6*   CBG: No results for input(s): "GLUCAP" in the last 168 hours.  Time spent: 35 minutes  Signed:  Gillis Santa  [facility] 12/08/2023 2:46 PM

## 2023-12-08 NOTE — Progress Notes (Signed)
PT Cancellation Note  Patient Details Name: Autumn Johnston MRN: [id number] DOB: 12-10-1922   Cancelled Treatment:    Reason Eval/Treat Not Completed: Patient declined, no reason specified Patient declines PT. She is planning to return to [facility] at discharge. Will sign off.   Fradel Baldonado 12/08/2023, 1:19 PM

## 2023-12-08 NOTE — Care Management Important Message (Signed)
Important Message  Patient Details  Name: Delaynee Edquist MRN: [id number] Date of Birth: 1922/01/16   Important Message Given:  Yes - Medicare IM     Sebastiano Luecke, Stephan Minister 12/08/2023, 3:10 PM

## 2023-12-08 NOTE — TOC Transition Note (Signed)
Transition of Care [facility]) - Discharge Note   Patient Details  Name: Autumn Johnston MRN: [id number] Date of Birth: [birth year]  [facility] CM/SW Contact:  Truddie Hidden, RN Phone Number: 12/08/2023, 3:05 PM   Clinical Narrative:    Sherron Monday with Gavin Pound in admissions at [facility] Per facility patient admission confirmed for today. Patient assigned room # 303 P Nurse will call report to [phone] Face sheet and medical necessity forms printed to the floor to be added to the EMS pack EMS arranged  Discharge summary and SNF transfer report sent in HUB.  Nurse, and family notified spoke with TOC signing off.    Final next level of care: Skilled Nursing Facility Barriers to Discharge: Barriers Resolved   Patient Goals and CMS Choice Patient states their goals for this hospitalization and ongoing recovery are:: SNF          Discharge Placement              Patient chooses bed at: [facility] Patient to be transferred to facility by: [facility] Name of family member notified: Tinnie Gens Patient and family notified of of transfer: 12/08/23  Discharge Plan and Services Additional resources added to the After Visit Summary for                                       Social Drivers of Health (SDOH) Interventions SDOH Screenings   Food Insecurity: Patient Unable To Answer (10/01/2023)  Housing: Patient Unable To Answer (10/01/2023)  Transportation Needs: Patient Unable To Answer (10/01/2023)  Utilities: Patient Unable To Answer (10/01/2023)  Tobacco Use: Low Risk  (11/21/2023)     Readmission Risk Interventions    10/04/2023    2:38 PM 03/31/2022   11:40 AM 09/17/2021    9:37 AM  Readmission Risk Prevention Plan  Transportation Screening Complete Complete Complete  PCP or Specialist Appt within 3-5 Days Complete Complete   HRI or Home Care Consult Complete Complete   Social Work Consult for Recovery Care Planning/Counseling  Complete Complete   Palliative Care Screening Complete Not Applicable   Medication Review Oceanographer) Complete Complete Complete  PCP or Specialist appointment within 3-5 days of discharge   Complete  HRI or Home Care Consult   Complete  SW Recovery Care/Counseling Consult   Not Complete  SW Consult Not Complete Comments   RNCM assigned to case  Palliative Care Screening   Not Applicable  Comments   na  Skilled Nursing Facility   Complete

## 2023-12-08 NOTE — Progress Notes (Signed)
OT Cancellation Note  Patient Details Name: [name] MRN: [id number] DOB: [birth year]   Cancelled Treatment:    Reason Eval/Treat Not Completed: Patient declined, no reason specified. Pt declining participation in therapy. Planning on returning to long-term care at discharge. No further acute OT needs, OT will sign off.   Azharia Surratt L. Keyry Iracheta, OTR/L  12/08/23, 1:34 PM

## 2023-12-08 NOTE — Plan of Care (Signed)

## 2023-12-08 NOTE — TOC Progression Note (Signed)
Spoke with patient's daughter,  Tinnie Gens. She inquired about  when patient would discharge. Tinnie Gens advised patient would discharge when she is medically stable per her attending. Tinnie Gens inquired about STR vs LTC. RNCM reiterated patient will return to [facility] with STR then convert to LTC. Tinnie Gens inquired about hospice care. She was advised if hospice is needed t a later time once patient convert to LTSS hospice while at the facility would be covered.   Spoke with Information systems manager at [place]. Patient can return today. MD notified summary is needed by 3:00pm.   2:40pm [name] notified patient will discharge to [facility] today by EMS.

## 2024-02-19 DEATH — deceased
